# Patient Record
Sex: Female | Born: 1960 | Race: White | Hispanic: No | Marital: Married | Smoking: Never smoker
Health system: Southern US, Community
[De-identification: ages and names within clinical notes are randomized; demographics above are authoritative.]

## PROBLEM LIST (undated history)

## (undated) DIAGNOSIS — F419 Anxiety disorder, unspecified: Secondary | ICD-10-CM

## (undated) DIAGNOSIS — R112 Nausea with vomiting, unspecified: Secondary | ICD-10-CM

## (undated) DIAGNOSIS — Z923 Personal history of irradiation: Secondary | ICD-10-CM

## (undated) DIAGNOSIS — K449 Diaphragmatic hernia without obstruction or gangrene: Secondary | ICD-10-CM

## (undated) DIAGNOSIS — Z9889 Other specified postprocedural states: Secondary | ICD-10-CM

## (undated) DIAGNOSIS — K219 Gastro-esophageal reflux disease without esophagitis: Secondary | ICD-10-CM

## (undated) DIAGNOSIS — Z9221 Personal history of antineoplastic chemotherapy: Secondary | ICD-10-CM

## (undated) DIAGNOSIS — R232 Flushing: Secondary | ICD-10-CM

## (undated) DIAGNOSIS — C50919 Malignant neoplasm of unspecified site of unspecified female breast: Secondary | ICD-10-CM

## (undated) DIAGNOSIS — D649 Anemia, unspecified: Secondary | ICD-10-CM

## (undated) DIAGNOSIS — R12 Heartburn: Secondary | ICD-10-CM

## (undated) DIAGNOSIS — I10 Essential (primary) hypertension: Secondary | ICD-10-CM

## (undated) DIAGNOSIS — E785 Hyperlipidemia, unspecified: Secondary | ICD-10-CM

## (undated) DIAGNOSIS — K5792 Diverticulitis of intestine, part unspecified, without perforation or abscess without bleeding: Secondary | ICD-10-CM

## (undated) DIAGNOSIS — K5790 Diverticulosis of intestine, part unspecified, without perforation or abscess without bleeding: Secondary | ICD-10-CM

## (undated) HISTORY — DX: Anemia, unspecified: D64.9

## (undated) HISTORY — PX: BREAST BIOPSY: SHX20

## (undated) HISTORY — DX: Diaphragmatic hernia without obstruction or gangrene: K44.9

## (undated) HISTORY — DX: Malignant neoplasm of unspecified site of unspecified female breast: C50.919

## (undated) HISTORY — PX: TONSILLECTOMY AND ADENOIDECTOMY: SUR1326

## (undated) HISTORY — DX: Diverticulitis of intestine, part unspecified, without perforation or abscess without bleeding: K57.92

## (undated) HISTORY — DX: Gastro-esophageal reflux disease without esophagitis: K21.9

## (undated) HISTORY — DX: Diverticulosis of intestine, part unspecified, without perforation or abscess without bleeding: K57.90

## (undated) HISTORY — DX: Essential (primary) hypertension: I10

## (undated) HISTORY — DX: Heartburn: R12

## (undated) HISTORY — PX: TUBAL LIGATION: SHX77

## (undated) HISTORY — DX: Hyperlipidemia, unspecified: E78.5

## (undated) HISTORY — DX: Flushing: R23.2

## (undated) HISTORY — DX: Anxiety disorder, unspecified: F41.9

---

## 1998-12-20 ENCOUNTER — Other Ambulatory Visit: Admission: RE | Admit: 1998-12-20 | Discharge: 1998-12-20 | Payer: Self-pay | Admitting: Obstetrics and Gynecology

## 1999-05-15 ENCOUNTER — Inpatient Hospital Stay (HOSPITAL_COMMUNITY): Admission: AD | Admit: 1999-05-15 | Discharge: 1999-05-15 | Payer: Self-pay | Admitting: Obstetrics and Gynecology

## 1999-05-30 ENCOUNTER — Inpatient Hospital Stay (HOSPITAL_COMMUNITY): Admission: AD | Admit: 1999-05-30 | Discharge: 1999-05-30 | Payer: Self-pay | Admitting: Obstetrics and Gynecology

## 1999-06-15 ENCOUNTER — Inpatient Hospital Stay (HOSPITAL_COMMUNITY): Admission: AD | Admit: 1999-06-15 | Discharge: 1999-06-18 | Payer: Self-pay | Admitting: Obstetrics and Gynecology

## 1999-06-19 ENCOUNTER — Encounter (HOSPITAL_COMMUNITY): Admission: RE | Admit: 1999-06-19 | Discharge: 1999-09-17 | Payer: Self-pay | Admitting: *Deleted

## 1999-07-31 ENCOUNTER — Other Ambulatory Visit: Admission: RE | Admit: 1999-07-31 | Discharge: 1999-07-31 | Payer: Self-pay | Admitting: Obstetrics and Gynecology

## 1999-12-11 HISTORY — PX: CHOLECYSTECTOMY: SHX55

## 2000-06-11 ENCOUNTER — Observation Stay (HOSPITAL_COMMUNITY): Admission: RE | Admit: 2000-06-11 | Discharge: 2000-06-12 | Payer: Self-pay | Admitting: Surgery

## 2000-10-01 ENCOUNTER — Other Ambulatory Visit: Admission: RE | Admit: 2000-10-01 | Discharge: 2000-10-01 | Payer: Self-pay | Admitting: Obstetrics and Gynecology

## 2001-10-14 ENCOUNTER — Other Ambulatory Visit: Admission: RE | Admit: 2001-10-14 | Discharge: 2001-10-14 | Payer: Self-pay | Admitting: Obstetrics and Gynecology

## 2003-10-13 ENCOUNTER — Other Ambulatory Visit: Admission: RE | Admit: 2003-10-13 | Discharge: 2003-10-13 | Payer: Self-pay | Admitting: Obstetrics and Gynecology

## 2004-10-30 ENCOUNTER — Other Ambulatory Visit: Admission: RE | Admit: 2004-10-30 | Discharge: 2004-10-30 | Payer: Self-pay | Admitting: Obstetrics and Gynecology

## 2005-11-23 ENCOUNTER — Other Ambulatory Visit: Admission: RE | Admit: 2005-11-23 | Discharge: 2005-11-23 | Payer: Self-pay | Admitting: Obstetrics and Gynecology

## 2011-01-04 ENCOUNTER — Encounter
Admission: RE | Admit: 2011-01-04 | Discharge: 2011-01-04 | Payer: Self-pay | Source: Home / Self Care | Attending: Internal Medicine | Admitting: Internal Medicine

## 2011-01-24 ENCOUNTER — Emergency Department (HOSPITAL_COMMUNITY)
Admission: EM | Admit: 2011-01-24 | Discharge: 2011-01-25 | Disposition: A | Discharge status: Home / Self Care | Payer: BC Managed Care – PPO | Attending: Emergency Medicine | Admitting: Emergency Medicine

## 2011-01-24 DIAGNOSIS — K5732 Diverticulitis of large intestine without perforation or abscess without bleeding: Secondary | ICD-10-CM | POA: Insufficient documentation

## 2011-01-24 DIAGNOSIS — K5900 Constipation, unspecified: Secondary | ICD-10-CM | POA: Insufficient documentation

## 2011-01-24 DIAGNOSIS — R1915 Other abnormal bowel sounds: Secondary | ICD-10-CM | POA: Insufficient documentation

## 2011-01-24 DIAGNOSIS — R109 Unspecified abdominal pain: Secondary | ICD-10-CM | POA: Insufficient documentation

## 2011-01-24 DIAGNOSIS — Z79899 Other long term (current) drug therapy: Secondary | ICD-10-CM | POA: Insufficient documentation

## 2011-01-24 LAB — CBC
Hemoglobin: 12.5 g/dL (ref 12.0–15.0)
MCH: 25.9 pg — ABNORMAL LOW (ref 26.0–34.0)
Platelets: 249 10*3/uL (ref 150–400)
RBC: 4.82 MIL/uL (ref 3.87–5.11)
WBC: 12.4 10*3/uL — ABNORMAL HIGH (ref 4.0–10.5)

## 2011-01-24 LAB — BASIC METABOLIC PANEL
CO2: 27 mEq/L (ref 19–32)
Chloride: 106 mEq/L (ref 96–112)
Creatinine, Ser: 0.56 mg/dL (ref 0.4–1.2)
GFR calc Af Amer: >60 mL/min (ref >60)
Sodium: 142 mEq/L (ref 135–145)

## 2011-01-24 LAB — DIFFERENTIAL
Basophils Absolute: 0.1 10*3/uL (ref 0.0–0.1)
Basophils Relative: 1 % (ref 0–1)
Eosinophils Absolute: 0.2 10*3/uL (ref 0.0–0.7)
Monocytes Relative: 7 % (ref 3–12)
Neutro Abs: 7 10*3/uL (ref 1.7–7.7)
Neutrophils Relative %: 56 % (ref 43–77)

## 2011-01-25 ENCOUNTER — Emergency Department (HOSPITAL_COMMUNITY): Payer: BC Managed Care – PPO

## 2011-01-25 LAB — URINALYSIS, ROUTINE W REFLEX MICROSCOPIC
Ketones, ur: NEGATIVE mg/dL
Nitrite: NEGATIVE
Protein, ur: NEGATIVE mg/dL
pH: 6 (ref 5.0–8.0)

## 2011-01-25 LAB — POCT PREGNANCY, URINE: Preg Test, Ur: NEGATIVE

## 2011-01-25 MED ORDER — IOHEXOL 300 MG/ML  SOLN
100.0000 mL | Freq: Once | INTRAMUSCULAR | Status: AC | PRN
  Administered 2011-01-25: 100 mL via INTRAVENOUS

## 2011-02-09 ENCOUNTER — Ambulatory Visit: Payer: Self-pay | Admitting: Gastroenterology

## 2012-09-25 ENCOUNTER — Encounter: Payer: Self-pay | Admitting: Physician Assistant

## 2012-11-20 LAB — LIPID PANEL
Cholesterol: 245 mg/dL — AB (ref 0–200)
HDL: 52 mg/dL (ref 35–70)
Triglycerides: 117 mg/dL (ref 40–160)

## 2012-11-26 ENCOUNTER — Telehealth: Payer: Self-pay | Admitting: *Deleted

## 2012-11-26 NOTE — Telephone Encounter (Signed)
Called pt to ask her if we are to monitored her cholesterol because is slightly high from a test that was performed at her ob gyn, she states that she called to schedule an appt. With Dr. Merla Riches and he is full that she does want for urgent medical to follow her cholesterol.

## 2012-11-26 NOTE — Telephone Encounter (Signed)
Per Dr. Patsy Lager pt needs to come in for blood work. Pt aware she will come in when she can.

## 2012-11-28 ENCOUNTER — Other Ambulatory Visit: Payer: Self-pay | Admitting: Obstetrics and Gynecology

## 2012-11-28 DIAGNOSIS — R928 Other abnormal and inconclusive findings on diagnostic imaging of breast: Secondary | ICD-10-CM

## 2012-12-08 ENCOUNTER — Ambulatory Visit
Admission: RE | Admit: 2012-12-08 | Discharge: 2012-12-08 | Disposition: A | Discharge status: Home / Self Care | Payer: BC Managed Care – PPO | Source: Ambulatory Visit | Attending: Obstetrics and Gynecology | Admitting: Obstetrics and Gynecology

## 2012-12-08 ENCOUNTER — Other Ambulatory Visit: Payer: Self-pay | Admitting: Obstetrics and Gynecology

## 2012-12-08 DIAGNOSIS — R928 Other abnormal and inconclusive findings on diagnostic imaging of breast: Secondary | ICD-10-CM

## 2012-12-08 DIAGNOSIS — C50919 Malignant neoplasm of unspecified site of unspecified female breast: Secondary | ICD-10-CM

## 2012-12-08 HISTORY — DX: Malignant neoplasm of unspecified site of unspecified female breast: C50.919

## 2012-12-11 ENCOUNTER — Telehealth: Payer: Self-pay | Admitting: *Deleted

## 2012-12-11 ENCOUNTER — Other Ambulatory Visit: Payer: BC Managed Care – PPO

## 2012-12-11 NOTE — Telephone Encounter (Signed)
Pt called and I confirmed 12/17/12 appt w/ pt.

## 2012-12-12 ENCOUNTER — Other Ambulatory Visit: Payer: Self-pay | Admitting: *Deleted

## 2012-12-12 DIAGNOSIS — C50419 Malignant neoplasm of upper-outer quadrant of unspecified female breast: Secondary | ICD-10-CM

## 2012-12-12 DIAGNOSIS — Z171 Estrogen receptor negative status [ER-]: Secondary | ICD-10-CM | POA: Insufficient documentation

## 2012-12-12 DIAGNOSIS — C50411 Malignant neoplasm of upper-outer quadrant of right female breast: Secondary | ICD-10-CM

## 2012-12-12 HISTORY — DX: Estrogen receptor negative status (ER-): Z17.1

## 2012-12-12 HISTORY — DX: Estrogen receptor negative status (ER-): C50.411

## 2012-12-17 ENCOUNTER — Encounter: Payer: Self-pay | Admitting: *Deleted

## 2012-12-17 ENCOUNTER — Ambulatory Visit
Admission: RE | Admit: 2012-12-17 | Discharge: 2012-12-17 | Disposition: A | Discharge status: Home / Self Care | Payer: BC Managed Care – PPO | Source: Ambulatory Visit | Attending: Radiation Oncology | Admitting: Radiation Oncology

## 2012-12-17 ENCOUNTER — Ambulatory Visit: Payer: BC Managed Care – PPO | Attending: General Surgery | Admitting: Physical Therapy

## 2012-12-17 ENCOUNTER — Encounter: Payer: Self-pay | Admitting: Oncology

## 2012-12-17 ENCOUNTER — Ambulatory Visit (HOSPITAL_BASED_OUTPATIENT_CLINIC_OR_DEPARTMENT_OTHER): Payer: BC Managed Care – PPO | Admitting: Oncology

## 2012-12-17 ENCOUNTER — Ambulatory Visit (HOSPITAL_BASED_OUTPATIENT_CLINIC_OR_DEPARTMENT_OTHER): Payer: BC Managed Care – PPO | Admitting: General Surgery

## 2012-12-17 ENCOUNTER — Ambulatory Visit: Payer: BC Managed Care – PPO

## 2012-12-17 ENCOUNTER — Other Ambulatory Visit (HOSPITAL_BASED_OUTPATIENT_CLINIC_OR_DEPARTMENT_OTHER): Payer: BC Managed Care – PPO | Admitting: Lab

## 2012-12-17 VITALS — BP 150/102 | HR 85 | Temp 98.0°F | Resp 20 | Ht 66.5 in | Wt 234.3 lb

## 2012-12-17 DIAGNOSIS — C50419 Malignant neoplasm of upper-outer quadrant of unspecified female breast: Secondary | ICD-10-CM

## 2012-12-17 DIAGNOSIS — M25619 Stiffness of unspecified shoulder, not elsewhere classified: Secondary | ICD-10-CM | POA: Insufficient documentation

## 2012-12-17 DIAGNOSIS — IMO0001 Reserved for inherently not codable concepts without codable children: Secondary | ICD-10-CM | POA: Insufficient documentation

## 2012-12-17 DIAGNOSIS — C50919 Malignant neoplasm of unspecified site of unspecified female breast: Secondary | ICD-10-CM | POA: Insufficient documentation

## 2012-12-17 DIAGNOSIS — R293 Abnormal posture: Secondary | ICD-10-CM | POA: Insufficient documentation

## 2012-12-17 DIAGNOSIS — Z171 Estrogen receptor negative status [ER-]: Secondary | ICD-10-CM

## 2012-12-17 LAB — COMPREHENSIVE METABOLIC PANEL (CC13)
ALT: 22 U/L (ref 0–55)
AST: 14 U/L (ref 5–34)
Albumin: 4.2 g/dL (ref 3.5–5.0)
Alkaline Phosphatase: 112 U/L (ref 40–150)
Chloride: 102 mEq/L (ref 98–107)
Potassium: 4 mEq/L (ref 3.5–5.1)
Sodium: 141 mEq/L (ref 136–145)
Total Protein: 8.1 g/dL (ref 6.4–8.3)

## 2012-12-17 LAB — CBC WITH DIFFERENTIAL/PLATELET
EOS%: 2.2 % (ref 0.0–7.0)
MCH: 26 pg (ref 25.1–34.0)
MCV: 79.2 fL — ABNORMAL LOW (ref 79.5–101.0)
MONO%: 6.4 % (ref 0.0–14.0)
NEUT#: 4.2 10*3/uL (ref 1.5–6.5)
RBC: 4.92 10*6/uL (ref 3.70–5.45)
RDW: 13.5 % (ref 11.2–14.5)
lymph#: 3.2 10*3/uL (ref 0.9–3.3)

## 2012-12-17 LAB — CANCER ANTIGEN 27.29: CA 27.29: 38 U/mL (ref 0–39)

## 2012-12-17 NOTE — Progress Notes (Signed)
Checked in new pt with no financial concerns. °

## 2012-12-17 NOTE — Patient Instructions (Addendum)
Proceed with breast surgery and port placement  We discussed chemotherapy consisting of Taxoter/carboplatin/herceptin  Echocardiogram Chemotherapy teaching class MRI of breasts Dr. Shana Chute

## 2012-12-17 NOTE — Progress Notes (Signed)
Chief complaint:  Right breast cancer.    HISTORY: Patient is a 51-year-old female who presents with a palpable right breast mass. This is seen on mammogram and ultrasound. On Wednesday it appeared a 1.6 cm and another was 2.5. She has been able to feel it. She has no family history of breast cancer but did have a father with stomach cancer at age 23, a maternal grandmother with skin cancer,  maternal cousin with Hodgkin's, and a paternal grandfather with lung cancer.  She has no personal history of cancer. 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 She has not ever been a smoker and does drink around 1-2 beers per week. She does not use any illicit drugs. Menarche was at 15. She is no longer having periods and ceased having periods in 2010. She has not ever had any hormone replacement therapy. She carried 3 children to term and did use hormone shots for contraception but does not recall the period of time. Other than colonoscopy her health screening is up to date.  Past Medical History  Diagnosis Date  . Breast cancer   . Hyperlipidemia   . Diverticulitis   . Hernia, hiatal   . Heartburn   . Anxiety   . Hot flashes     Past Surgical History  Procedure Date  . Cesarean section     x 3  . Tubal ligation   . Tonsillectomy and adenoidectomy     Current Outpatient Prescriptions  Medication Sig Dispense Refill  . ibuprofen (ADVIL,MOTRIN) 200 MG tablet Take 200 mg by mouth as needed.      . LORazepam (ATIVAN) 0.5 MG tablet Take 0.5 mg by mouth as needed.      . omeprazole (PRILOSEC) 20 MG capsule Take 20 mg by mouth daily.      . Probiotic Product (PROBIOTIC DAILY PO) Take 1 each by mouth.         Allergies  Allergen Reactions  . Codeine   . Medralone (Methylprednisolone Acetate)      Family History  Problem Relation Age of Onset  . Stomach cancer Father   . Lung cancer Maternal Uncle   . Skin cancer Maternal Grandmother   . Lung cancer Maternal Grandfather   . Lymphoma Cousin       History   Social History  . Marital Status: Single    Spouse Name: N/A    Number of Children: N/A  . Years of Education: N/A   Social History Main Topics  . Smoking status: Never Smoker   . Smokeless tobacco: Not on file  . Alcohol Use: 1.2 oz/week    2 Cans of beer per week  . Drug Use: No  . Sexually Active: Yes     REVIEW OF SYSTEMS - PERTINENT POSITIVES ONLY: 12 point review of systems negative other than HPI and PMH except for fatigue, night sweats, difficulty sleeping, palpitations, heartburn, anxiety  EXAM: Wt Readings from Last 3 Encounters:  12/17/12 234 lb 4.8 oz (106.278 kg)   Temp Readings from Last 3 Encounters:  12/17/12 98 F (36.7 C)    BP Readings from Last 3 Encounters:  12/17/12 150/102   Pulse Readings from Last 3 Encounters:  12/17/12 85    Gen:  No acute distress.  Well nourished and well groomed.   Neurological: Alert and oriented to person, place, and time. Coordination normal.  Head: Normocephalic and atraumatic.  Eyes: Conjunctivae are normal. Pupils are equal, round, and reactive to light. No scleral   icterus.  Neck: Normal range of motion. Neck supple. No tracheal deviation or thyromegaly present.  Cardiovascular: Normal rate, regular rhythm, normal heart sounds and intact distal pulses.  Exam reveals no gallop and no friction rub.  No murmur heard. Respiratory: Effort normal.  No respiratory distress. No chest wall tenderness. Breath sounds normal.  No wheezes, rales or rhonchi.  Breast:  Breasts are reasonably symmetric.  Right breast has 2 cm mass palpable at 11:30-12 oclock.  No LAD present.  No nipple retraction or skin dimpling is seen.   GI: Soft. Bowel sounds are normal. The abdomen is soft and nontender.  There is no rebound and no guarding.  Musculoskeletal: Normal range of motion. Extremities are nontender.  Lymphadenopathy: No cervical, preauricular, postauricular or axillary adenopathy is present Skin: Skin is warm and  dry. No rash noted. No diaphoresis. No erythema. No pallor. No clubbing, cyanosis, or edema.   Psychiatric: Normal mood and affect. Behavior is normal. Judgment and thought content normal.    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 - SEE COMMENT.  RADIOLOGY RESULTS: See E-Chart or I-Site for most recent results.  Images and reports are reviewed. Mammo/ultrasound IMPRESSION:  1.6 cm irregular mass located within the right breast at the 11:30  o'clock position 7 cm from the nipple corresponding to the  mammographic finding. Tissue sampling is recommended. I have  discussed ultrasound-guided core biopsy. The patient would like to  proceed with this. This will be performed and reported separately.  RECOMMENDATION:  Right breast ultrasound guided core biopsy.  I have discussed the findings and recommendations with the patient.  Results were also provided in writing at the conclusion of the  visit.  BI-RADS CATEGORY 5: Highly suggestive of malignancy - appropriate  action should be taken.     ASSESSMENT AND PLAN: Cancer of upper-outer quadrant of female breast Patient has clinical T1 N0 right breast cancer with a palpable mass at 12:00.  Based on the size discrepancy on mammogram versus ultrasound, she is requesting an MRI. She has hormone receptor negative disease with positive Herceptin and relatively high Ki-67.  I will tentatively posterior for a lumpectomy without needle localization, sentinel lymph node biopsy, and Port-A-Cath placement.   The surgery and incisions were discussed with the patient as well as the risks of surgery. I discussed bleeding, infection, damage to adjacent structures, pneumothorax, need for additional surgeries, and possible cancer recurrence.  The patient understands and wishes to proceed. Should her MRI shows something that would necessitate a change in her  surgical plan we will discuss this. If she does have a much larger tumor on MRI, we will proceed with neoadjuvant chemotherapy and only do port placement.  45 minutes were spent in counseling, evaluation, examination, and coordination of care.       Nikkole Placzek L Lebert Lovern MD Surgical Oncology, General and Endocrine Surgery [facility]      Visit Diagnoses: 1. Cancer of upper-outer quadrant of female breast     Primary Care Physician: DOOLITTLE, ROBERT P, MD  Khan, Kalsoom, MD [place], St[facility]  

## 2012-12-17 NOTE — Progress Notes (Signed)
Julia Zuniga [id number] [birth year] 51 y.o. 12/17/2012 10:19 AM  CC  DOOLITTLE, Harrel Lemon, MD [address] [place] [address] Dr. Almond Lint Dr. Verne Spurr Dr. Richarda Overlie  REASON FOR CONSULTATION:  52 year old female with new diagnosis of invasive ductal carcinoma Of the right breast diagnosed in December 2013. Patient is seen in the multidisciplinary breast clinic for discussion of treatment options.  STAGE:   Cancer of upper-outer quadrant of female breast   Primary site: Breast (Right)   Staging method: AJCC 7th Edition   Clinical: Stage IA (T1c, N0, cM0)   Summary: Stage IA (T1c, N0, cM0)  REFERRING PHYSICIAN: Dr. Everardo Beals  HISTORY OF PRESENT ILLNESS:  Julia Zuniga is a 52 y.o. female.  With medical history significant for hyperlipidemia diverticulitis and hiatal hernia. She presented for a screening mammogram in December 2013 that showed an area of abnormality on the right. This measured about 1.6 cm. 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 HER-2/neu was amplified at 3.88. Patient did not have a MRI scan performed. Post biopsy she is doing well. She is without any complaints. Patient case was presented at the multidisciplinary breast conference. She is seen today by Dr. Lurline Hare and Dr. Everardo Beals. Patient does desire breast conservation.   Past Medical History: Past Medical History  Diagnosis Date  . Breast cancer   . Hyperlipidemia   . Diverticulitis   . Hernia, hiatal   . Heartburn   . Anxiety   . Hot flashes     Past Surgical History: Past Surgical History  Procedure Date  . Cesarean section     x 3  . Tubal ligation   . Tonsillectomy and adenoidectomy     Family History: Family History  Problem Relation Age of Onset  . Stomach cancer Father   . Lung cancer Maternal Uncle   . Skin cancer Maternal Grandmother   .  Lung cancer Maternal Grandfather   . Lymphoma Cousin     Social History History  Substance Use Topics  . Smoking status: Never Smoker   . Smokeless tobacco: Not on file  . Alcohol Use: 1.2 oz/week    2 Cans of beer per week    Allergies: Allergies  Allergen Reactions  . Codeine   . Medralone (Methylprednisolone Acetate)     Current Medications: No current outpatient prescriptions on file.    OB/GYN History:Patient had menarche at age 30 she underwent menopause in September 2010 she has not been on hormone replacement therapy she said 3 term births first live birth was at 39.  Fertility Discussion:Not applicable Prior History of Cancer:No prior history of malignancy  Health Maintenance:  ColonoscopyNo Bone DensityYes in December 2013 Last PAP smearDecember 2013  ECOG PERFORMANCE STATUS: 0 - Asymptomatic  Genetic Counseling/testing:Patient's history was reviewed and she at this time she is now referred for genetic counseling  REVIEW OF SYSTEMS:  A comprehensive review of systems was negative.  PHYSICAL EXAMINATION: Blood pressure 150/102, pulse 85, temperature 98 F (36.7 C), resp. rate 20, height 5' 6.5" (1.689 m), weight 234 lb 4.8 oz (106.278 kg).  XBJ:YNWGN, healthy, no distress, well nourished and well developed SKIN: skin color, texture, turgor are normal HEAD: Normocephalic EYES: PERRLA, EOMI, Conjunctiva are pink and non-injected EARS: External ears normal OROPHARYNX:no exudate and no erythema  NECK: supple, no adenopathy LYMPH:  no palpable lymphadenopathy, no hepatosplenomegaly BREAST:Right  breast reveals area of hematoma and ecchymosis at the biopsy site measuring 2.5 cm. Left breast no masses or nipple discharge the LUNGS: clear to auscultation and percussion HEART: regular rate & rhythm ABDOMEN:abdomen soft, non-tender and no masses or organomegaly BACK: No CVA tenderness, Range of motion is normal EXTREMITIES:no edema, no clubbing, no cyanosis    NEURO: alert & oriented x 3 with fluent speech, no focal motor/sensory deficits, gait normal     STUDIES/RESULTS: US Breast Right  12/08/2012  *RADIOLOGY REPORT*  Clinical Data:  Recall from screening mammogram.  DIGITAL DIAGNOSTIC RIGHT BREAST MAMMOGRAM  AND RIGHT BREAST ULTRASOUND:  Comparison:  Prior studies  Findings:  ACR Breast Density Category 1: The breast tissue is almost entirely fatty.  Spot compression views of the right breast demonstrate an irregular oval mass to be located superiorly within the right breast at approximately the 12 o'clock position.  By mammography this measures 2.5 cm in size.  On physical exam, there is a palpable mass located within the right breast at the 11 of 30 o'clock position located 7 cm from the nipple which by physical examination measures approximately 2.5 cm in size.  There is no palpable right axillary adenopathy.  Ultrasound is performed, showing an irregularly marginated hypoechoic mass with an echogenic rim and shadowing located within the right breast at the 11:30 o'clock position 7 cm from nipple corresponding to the palpable finding and mammographic finding. This measures 1.6 x 1.5 x 1.4 cm in size.  Ultrasound right axilla demonstrates no adenopathy.  IMPRESSION: 1.6 cm irregular mass located within the right breast at the 11:30 o'clock position 7 cm from the nipple corresponding to the mammographic finding.  Tissue sampling is recommended.  I have discussed ultrasound-guided core biopsy.  The patient would like to proceed with this.  This will be performed and reported separately.  RECOMMENDATION: Right breast ultrasound guided core biopsy.  I have discussed the findings and recommendations with the patient. Results were also provided in writing at the conclusion of the visit.  BI-RADS CATEGORY 5:  Highly suggestive of malignancy - appropriate action should be taken.   Original Report Authenticated By: Rolla Plate, M.D.    [place] Core  Biopsy  12/09/2012  **ADDENDUM** CREATED: 12/09/2012 11:01:38  The pathology demonstrated grade 2 invasive ductal carcinoma.  This is concordant with imaging findings.  I have discussed findings with the patient by telephone and answered her questions.  The patient states that her biopsy site is clean and dry without hematoma formation.  Post biopsy wound care instructions were reviewed with the patient.  The patient will be scheduled to be seen at the breast cancer multidisciplinary clinic on 12/17/2012. Due to our preoperative breast MRI protocol, breast MRI has not been scheduled but can be scheduled if felt needed by the breast surgeon.  The patient was encouraged to call the Breast Center for additional questions or concerns.  **END ADDENDUM** SIGNED BY: Rolla Plate, M.D.   12/08/2012  *RADIOLOGY REPORT*  Clinical Data:  Right breast mass.  ULTRASOUND GUIDED CORE BIOPSY OF THE right BREAST  Comparison: Previous exams.  I met with the patient and we discussed the procedure of ultrasound- guided biopsy, including benefits and alternatives.  We discussed the high likelihood of a successful procedure. We discussed the risks of the procedure, including infection, bleeding, tissue injury, clip migration, and inadequate sampling.  Informed written consent was given.  Using sterile technique 2% lidocaine, ultrasound guidance and a 14 gauge automated biopsy device,  biopsy was performed of the mass located within the right breast at the 11:30 o'clock position using a medial approach.  At the conclusion of the procedure a ribbon shaped tissue marker clip was deployed into the biopsy cavity. Follow up 2 view mammogram was performed and dictated separately.  IMPRESSION: Ultrasound guided biopsy of the right breast mass located at the 11:30 o'clock position.  No apparent complications.  Original Report Authenticated By: Rolla Plate, M.D.    Mm Digital Diagnostic Unilat R  12/08/2012  *RADIOLOGY REPORT*   Clinical Data:  Post right breast ultrasound guided core biopsy.  DIGITAL DIAGNOSTIC RIGHT BREAST MAMMOGRAM  Comparison:  Previous exams.  Findings:  Films are performed following ultrasound guided biopsy of the mass located within the right breast at the 11:30 o'clock position.  Ribbon shaped clip is in appropriate position.  IMPRESSION: Appropriate positioning of clip following right breast ultrasound guided core biopsy.   Original Report Authenticated By: Rolla Plate, M.D.    Mm Digital Diagnostic Unilat R  12/08/2012  *RADIOLOGY REPORT*  Clinical Data:  Recall from screening mammogram.  DIGITAL DIAGNOSTIC RIGHT BREAST MAMMOGRAM  AND RIGHT BREAST ULTRASOUND:  Comparison:  Prior studies  Findings:  ACR Breast Density Category 1: The breast tissue is almost entirely fatty.  Spot compression views of the right breast demonstrate an irregular oval mass to be located superiorly within the right breast at approximately the 12 o'clock position.  By mammography this measures 2.5 cm in size.  On physical exam, there is a palpable mass located within the right breast at the 11 of 30 o'clock position located 7 cm from the nipple which by physical examination measures approximately 2.5 cm in size.  There is no palpable right axillary adenopathy.  Ultrasound is performed, showing an irregularly marginated hypoechoic mass with an echogenic rim and shadowing located within the right breast at the 11:30 o'clock position 7 cm from nipple corresponding to the palpable finding and mammographic finding. This measures 1.6 x 1.5 x 1.4 cm in size.  Ultrasound right axilla demonstrates no adenopathy.  IMPRESSION: 1.6 cm irregular mass located within the right breast at the 11:30 o'clock position 7 cm from the nipple corresponding to the mammographic finding.  Tissue sampling is recommended.  I have discussed ultrasound-guided core biopsy.  The patient would like to proceed with this.  This will be performed and reported  separately.  RECOMMENDATION: Right breast ultrasound guided core biopsy.  I have discussed the findings and recommendations with the patient. Results were also provided in writing at the conclusion of the visit.  BI-RADS CATEGORY 5:  Highly suggestive of malignancy - appropriate action should be taken.   Original Report Authenticated By: Rolla Plate, M.D.      LABS:    Chemistry      Component Value Date/Time   NA 141 12/17/2012 0817   NA 142 01/24/2011 2235   K 4.0 12/17/2012 0817   K 3.9 01/24/2011 2235   CL 102 12/17/2012 0817   CL 106 01/24/2011 2235   CO2 27 12/17/2012 0817   CO2 27 01/24/2011 2235   BUN 9.0 12/17/2012 0817   BUN 7 01/24/2011 2235   CREATININE 0.7 12/17/2012 0817   CREATININE 0.56 01/24/2011 2235      Component Value Date/Time   CALCIUM 10.2 12/17/2012 0817   CALCIUM 10.0 01/24/2011 2235   ALKPHOS 112 12/17/2012 0817   AST 14 12/17/2012 0817   ALT 22 12/17/2012 0817   BILITOT 0.49 12/17/2012 0817  Lab Results  Component Value Date   WBC 8.2 12/17/2012   HGB 12.8 12/17/2012   HCT 38.9 12/17/2012   MCV 79.2* 12/17/2012   PLT 250 12/17/2012   PATHOLOGY: ADDITIONAL INFORMATION: PROGNOSTIC INDICATORS - ACIS Results IMMUNOHISTOCHEMICAL AND MORPHOMETRIC ANALYSIS BY THE AUTOMATED CELLULAR IMAGING SYSTEM (ACIS) Estrogen Receptor (Negative, <1%): 0%, NEGATIVE Progesterone Receptor (Negative, <1%): 0% ,NEGATIVE Proliferation Marker Ki67 by M IB-1 (Low<20%): 83% COMMENT: The negative hormone receptor study(ies) in this case have an internal positive control. All controls stained appropriately Pecola Leisure MD Pathologist, Electronic Signature ( Signed 12/17/2012) CHROMOGENIC IN-SITU HYBRIDIZATION Interpretation: HER2/NEU BY CISH - SHOWS AMPLIFICATION BY CISH ANALYSIS. THE RATIO OF HER2: CEP 17 SIGNALS WAS 3.88. Reference range: Ratio: HER2:CEP17 < 1.8 gene amplification not observed Ratio: HER2:CEP 17 1.8-2.2 - equivocal result 1 of 3 FINAL for Hreha, Corrina  ([id number]) ADDITIONAL INFORMATION:(continued) Ratio: HER2:CEP17 > 2.2 - gene amplification observed Pecola Leisure MD Pathologist, Electronic Signature ( Signed 12/16/2012) FINAL DIAGNOSIS Diagnosis Breast, right, needle core biopsy, 11:30 oc - INVASIVE DUCTAL CARCINOMA. - SEE COMMENT. Microscopic Comment The carcinoma appears grade II. A breast prognostic profile will be performed and the results reported separately. The results were called to the [facility] on 12/09/2012. (JBK:kh 12/09/12) Pecola Leisure MD Pathologist, Electronic Signature (Case signed 12/09/2012)  ASSESSMENT    53 year old female with  #1 new diagnosis of stage I (1.6 cm) invasive ductal carcinoma of the right breast after she presented with a screening mammogram in December 2013. Patient is status post needle core biopsy that revealed an invasive ductal carcinoma grade 2 ER negative PR negative HER-2/neu positive with amplification of 3.88 Ki-67 elevated at 80%. Patient is interested in breast conservation. She was seen by Dr. Everardo Beals who will plan on doing a lumpectomy with sentinel lymph node biopsy.  #2 because patient's tumor is HER-2 positive she will require her to based chemotherapy consisting of Taxotere carboplatinum and Herceptin. We discussed the logistics of this. Post lumpectomy she will also need radiation therapy.  #3 of note patient's mass on ultrasound measured 1.6 cm but by mammogram it was 2.5 cm she has not had a MRI performed and I have recommended that she proceed with MRI since there is discordant results by mammogram and ultrasound. I do think it is important for [place] to identify any other areas of concern before she undergoes breast conservation. All of this is explained to the patient.  #4 patient will also need an echocardiogram chemotherapy class and I have also referred her to Dr. Barth Kirks. For chemotherapy administration she will also need a Port-A-Cath and this  certainly can be done at the time of her definitive surgery.  Clinical Trial Eligibility: no  Multidisciplinary conference discussion yes    PLAN:    #1 proceed with surgery consisting of lumpectomy with sentinel lymph node biopsy and a Port-A-Cath placement.  #2 patient will receive her 2-based therapy and therefore she will need an echocardiogram be seen by Dr. Gala Romney and her chemotherapy teaching class.  #3 MRI of the bilateral breasts to evaluate any other occult malignancies.  #4 patient will be seen back after her definitive search       Discussion: Patient is being treated per NCCN breast cancer care guidelines appropriate for stage.I   Thank you so much for allowing me to participate in the care of Julia Zuniga. I will continue to follow up the patient with you and assist in her care.  All questions were answered.  The patient knows to call the clinic with any problems, questions or concerns. We can certainly see the patient much sooner if necessary.  I spent 60 minutes counseling the patient face to face. The total time spent in the appointment was 60 minutes.  Drue Second, MD Medical/Oncology [facility] [phone] (beeper) [phone] (Office)  12/17/2012, 10:20 AM

## 2012-12-17 NOTE — Progress Notes (Signed)
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 female  percent of her screening mammogram. She was found to have an area of abnormality on the right side. This measured 1.6 cm. A biopsy was performed which showed grade 2 invasive ductal carcinoma. This was ER negative PR negative and HER-2 positive. Ki-67 at 80%. She did not have an MRI scan. She reports no symptoms prior to her biopsy. She reports no headaches bone pain or shortness of breath. He is accompanied by her mother to clinic today. To his spoke with Dr. Welton Flakes and has elected for breast conservation.Marland Kitchen  PREVIOUS RADIATION THERAPY: No  PAST MEDICAL HISTORY:  has a past medical history of Breast cancer; Hyperlipidemia; Diverticulitis; Hernia, hiatal; Heartburn; Anxiety; and Hot flashes.    PAST SURGICAL HISTORY: Past Surgical History  Procedure Date  . Cesarean section     x 3  . Tubal ligation   . Tonsillectomy and adenoidectomy     FAMILY HISTORY: family history includes Lung cancer in her maternal grandfather and maternal uncle; Lymphoma in her cousin; Skin cancer in her maternal grandmother; and Stomach cancer in her father.  SOCIAL HISTORY:  reports that she has never smoked. She does not have any smokeless tobacco history on file. She reports that she drinks about 1.2 ounces of alcohol per week. She reports that she does not use illicit drugs.  ALLERGIES: Codeine and Medralone  MEDICATIONS:  Current Outpatient Prescriptions  Medication Sig Dispense Refill  . ibuprofen (ADVIL,MOTRIN) 200 MG tablet Take 200 mg by mouth as needed.      Marland Kitchen LORazepam (ATIVAN) 0.5 MG tablet Take 0.5 mg by mouth as needed.      Marland Kitchen omeprazole (PRILOSEC)  20 MG capsule Take 20 mg by mouth daily.      . Probiotic Product (PROBIOTIC DAILY PO) Take 1 each by mouth.        REVIEW OF SYSTEMS:  A 15 point review of systems is documented in the electronic medical record. This was obtained by the nursing staff. However, I reviewed this with the patient to discuss relevant findings and make appropriate changes.  Pertinent items are noted in HPI.   PHYSICAL EXAM:  vitals were not taken for this visit.  . She is a pleasant female in no distress sitting comfortably examining table. She has no palpable cervical or subclavicular adenopathy. She is alert minus x3. She is 5 out of 5 strength in her bilateral upper extremities. She has a 3 cm palpable abnormality in the upper outer quadrant of the right breast. She has no palpable abnormalities of the left breast.  LABORATORY DATA:  Lab Results  Component Value Date   WBC 8.2 12/17/2012   HGB 12.8 12/17/2012   HCT 38.9 12/17/2012   MCV 79.2* 12/17/2012   PLT 250 12/17/2012   Lab Results  Component Value Date   NA 141 12/17/2012   K 4.0 12/17/2012   CL 102 12/17/2012   CO2 27 12/17/2012   Lab Results  Component Value Date   ALT 22 12/17/2012   AST 14 12/17/2012   ALKPHOS 112 12/17/2012   BILITOT 0.49 12/17/2012     RADIOGRAPHY: US Breast Right  12/08/2012  *RADIOLOGY REPORT*  Clinical Data:  Recall from screening mammogram.  DIGITAL DIAGNOSTIC RIGHT BREAST MAMMOGRAM  AND RIGHT BREAST ULTRASOUND:  Comparison:  Prior studies  Findings:  ACR Breast Density Category 1: The breast tissue is almost entirely fatty.  Spot compression views of the right breast demonstrate an irregular oval mass to be located superiorly within the right breast at approximately the 12 o'clock position.  By mammography this measures 2.5 cm in size.  On physical exam, there is a palpable mass located within the right breast at the 11 of 30 o'clock position located 7 cm from the nipple which by physical examination measures approximately 2.5 cm in size.   There is no palpable right axillary adenopathy.  Ultrasound is performed, showing an irregularly marginated hypoechoic mass with an echogenic rim and shadowing located within the right breast at the 11:30 o'clock position 7 cm from nipple corresponding to the palpable finding and mammographic finding. This measures 1.6 x 1.5 x 1.4 cm in size.  Ultrasound right axilla demonstrates no adenopathy.  IMPRESSION: 1.6 cm irregular mass located within the right breast at the 11:30 o'clock position 7 cm from the nipple corresponding to the mammographic finding.  Tissue sampling is recommended.  I have discussed ultrasound-guided core biopsy.  The patient would like to proceed with this.  This will be performed and reported separately.  RECOMMENDATION: Right breast ultrasound guided core biopsy.  I have discussed the findings and recommendations with the patient. Results were also provided in writing at the conclusion of the visit.  BI-RADS CATEGORY 5:  Highly suggestive of malignancy - appropriate action should be taken.   Original Report Authenticated By: Rolla Plate, M.D.    [place] Core Biopsy  12/09/2012  **ADDENDUM** CREATED: 12/09/2012 11:01:38  The pathology demonstrated grade 2 invasive ductal carcinoma.  This is concordant with imaging findings.  I have discussed findings with the patient by telephone and answered her questions.  The patient states that her biopsy site is clean and dry without hematoma formation.  Post biopsy wound care instructions were reviewed with the patient.  The patient will be scheduled to be seen at the breast cancer multidisciplinary clinic on 12/17/2012. Due to our preoperative breast MRI protocol, breast MRI has not been scheduled but can be scheduled if felt needed by the breast surgeon.  The patient was encouraged to call the [facility] for additional questions or concerns.  **END ADDENDUM** SIGNED BY: Rolla Plate, M.D.   12/08/2012  *RADIOLOGY REPORT*  Clinical Data:   Right breast mass.  ULTRASOUND GUIDED CORE BIOPSY OF THE right BREAST  Comparison: Previous exams.  I met with the patient and we discussed the procedure of ultrasound- guided biopsy, including benefits and alternatives.  We discussed the high likelihood of a successful procedure. We discussed the risks of the procedure, including infection, bleeding, tissue injury, clip migration, and inadequate sampling.  Informed written consent was given.  Using sterile technique 2% lidocaine, ultrasound guidance and a 14 gauge automated biopsy device, biopsy was performed of the mass located within the right breast at the 11:30 o'clock position using a medial approach.  At the conclusion of the procedure a ribbon shaped tissue marker clip was deployed into the biopsy cavity. Follow up 2 view mammogram was performed and dictated separately.  IMPRESSION: Ultrasound guided biopsy of the right breast mass located at the 11:30 o'clock position.  No apparent complications.  Original Report Authenticated By: Rolla Plate, M.D.    Mm Digital Diagnostic Unilat R  12/08/2012  *RADIOLOGY REPORT*  Clinical Data:  Post right breast ultrasound guided core biopsy.  DIGITAL DIAGNOSTIC RIGHT BREAST MAMMOGRAM  Comparison:  Previous exams.  Findings:  Films are performed following ultrasound guided biopsy of the mass located within the right breast at the 11:30 o'clock position.  Ribbon shaped clip is in appropriate position.  IMPRESSION: Appropriate positioning of clip following right breast ultrasound guided core biopsy.   Original Report Authenticated By: Rolla Plate, M.D.    Mm Digital Diagnostic Unilat R  12/08/2012  *RADIOLOGY REPORT*  Clinical Data:  Recall from screening mammogram.  DIGITAL DIAGNOSTIC RIGHT BREAST MAMMOGRAM  AND RIGHT BREAST ULTRASOUND:  Comparison:  Prior studies  Findings:  ACR Breast Density Category 1: The breast tissue is almost entirely fatty.  Spot compression views of the right breast demonstrate  an irregular oval mass to be located superiorly within the right breast at approximately the 12 o'clock position.  By mammography this measures 2.5 cm in size.  On physical exam, there is a palpable mass located within the right breast at the 11 of 30 o'clock position located 7 cm from the nipple which by physical examination measures approximately 2.5 cm in size.  There is no palpable right axillary adenopathy.  Ultrasound is performed, showing an irregularly marginated hypoechoic mass with an echogenic rim and shadowing located within the right breast at the 11:30 o'clock position 7 cm from nipple corresponding to the palpable finding and mammographic finding. This measures 1.6 x 1.5 x 1.4 cm in size.  Ultrasound right axilla demonstrates no adenopathy.  IMPRESSION: 1.6 cm irregular mass located within the right breast at the 11:30 o'clock position 7 cm from the nipple corresponding to the mammographic finding.  Tissue sampling is recommended.  I have discussed ultrasound-guided core biopsy.  The patient would like to proceed with this.  This will be performed and reported separately.  RECOMMENDATION: Right breast ultrasound guided core biopsy.  I have discussed the findings and recommendations with the patient. Results were also provided in writing at the conclusion of the visit.  BI-RADS CATEGORY 5:  Highly suggestive of malignancy - appropriate action should be taken.   Original Report Authenticated By: Rolla Plate, M.D.       IMPRESSION: T1 C. N0 invasive ductal carcinoma the right breast  PLAN: I spoke with the patient and her mother today regarding her diagnosis and options for treatment. We discussed this conservation. We discussed that radiation against 2-4 weeks after chemotherapy. She would like to complete radiation before her daughter's wedding in June. She would therefore like to start her radiation as soon as possible. I've asked her to remind her back oncologists and sent her back to see  me during her last round of chemotherapy. We discussed process of simulation the placement tattoos. We discussed the possible side effects of treatment including but not limited to skin redness, fatigue and skin irritation. We discussed the low likelihood of lung heart damage or secondary malignancies. She will meet with our physical therapist, our surgeon, and a member of our patient family support team. I will meet with her again after chemotherapy is complete. I spent 40 minutes  face to face with the patient and more than 50% of that time was spent in counseling and/or coordination of care.   ------------------------------------------------  Lurline Hare, MD

## 2012-12-17 NOTE — Assessment & Plan Note (Signed)
Patient has clinical T1 N0 right breast cancer with a palpable mass at 12:00.  Based on the size discrepancy on mammogram versus ultrasound, she is requesting an MRI. She has hormone receptor negative disease with positive Herceptin and relatively high Ki-67.  I will tentatively posterior for a lumpectomy without needle localization, sentinel lymph node biopsy, and Port-A-Cath placement.   The surgery and incisions were discussed with the patient as well as the risks of surgery. I discussed bleeding, infection, damage to adjacent structures, pneumothorax, need for additional surgeries, and possible cancer recurrence.  The patient understands and wishes to proceed. Should her MRI shows something that would necessitate a change in her surgical plan we will discuss this. If she does have a much larger tumor on MRI, we will proceed with neoadjuvant chemotherapy and only do port placement.  30 minutes were spent in counseling and evaluation.

## 2012-12-19 ENCOUNTER — Encounter: Payer: Self-pay | Admitting: Oncology

## 2012-12-19 NOTE — Progress Notes (Unsigned)
12/19/2012  I spoke extensively with several people and had a wait time of 45 mins. In an attempt to get this patient's Breast Mri authorized.  She has a new policy that has not be uploaded to their system and the current system does not recognize her old ID number.  The last person I spoke with ws Cordelia Pen, from open enrollment with [facility] [place].  [id number].  She informed me that the Breast Mri could not be authorized because the patiently information was not in the system.  She suggested that the patient either reschedule the exam or asked to be billed for the procedure.  I contacted Osborne Oman [facility] to contact the patient about her appointment.  Bonita Quin [phone]

## 2012-12-21 ENCOUNTER — Other Ambulatory Visit: Payer: BC Managed Care – PPO

## 2012-12-22 ENCOUNTER — Ambulatory Visit
Admission: RE | Admit: 2012-12-22 | Discharge: 2012-12-22 | Disposition: A | Discharge status: Home / Self Care | Payer: BC Managed Care – PPO | Source: Ambulatory Visit | Attending: Oncology | Admitting: Oncology

## 2012-12-22 ENCOUNTER — Telehealth: Payer: Self-pay | Admitting: Oncology

## 2012-12-22 DIAGNOSIS — C50419 Malignant neoplasm of upper-outer quadrant of unspecified female breast: Secondary | ICD-10-CM

## 2012-12-22 MED ORDER — GADOBENATE DIMEGLUMINE 529 MG/ML IV SOLN
20.0000 mL | Freq: Once | INTRAVENOUS | Status: AC | PRN
  Administered 2012-12-22: 20 mL via INTRAVENOUS

## 2012-12-23 ENCOUNTER — Telehealth: Payer: Self-pay | Admitting: *Deleted

## 2012-12-23 DIAGNOSIS — C50419 Malignant neoplasm of upper-outer quadrant of unspecified female breast: Secondary | ICD-10-CM

## 2012-12-23 NOTE — Telephone Encounter (Signed)
Let patient know that right now she is scheduled for the surgery first. Once we have the final pathology results then we will discuss the treatment in detail

## 2012-12-23 NOTE — Telephone Encounter (Signed)
Call from pt with concerns. Pt states " Am I going to get Herceptin q 3wks or every week, and What type of surgery will I have? Am I going to have a lumpectomy or mastectomy. The mass bigger than what they thought. It's a 3. I called and spoke to [facility] but I just wanted to double check."  01/08 Note from Dr. Donell Beers - pt scheduled tentatively posterior for a lumpectomy without needle localization, sentinel lymph node biopsy, and Port-A-Cath placement.  Should her MRI shows something that would necessitate a change in her surgical plan we will discuss this. If she does have a much larger tumor on MRI, we will proceed with neoadjuvant chemotherapy and only do port placement.  Will review with MD.

## 2012-12-23 NOTE — Telephone Encounter (Signed)
Spoke to pt concerning [facility] from 12/17/12.  Discussed chemo regimen times and time of radiation beginning.  Confirmed future appts and made f/u appt with Dr. Welton Flakes for 1/30.  Informed pt that she will be hearing from the scheduler about echo appt.  Pt denies further needs or concerns.  Encourage pt to call with questions.  Received verbal understanding.  Contact information given.

## 2012-12-24 ENCOUNTER — Telehealth: Payer: Self-pay | Admitting: Oncology

## 2012-12-24 NOTE — Telephone Encounter (Signed)
Per MD notified pt , right now she is scheduled for the surgery first and once we have the final pathology results then we will discuss the treatment in detail. Pt verbalized understanding. No further questions at this time.

## 2012-12-24 NOTE — Telephone Encounter (Signed)
S/w Amy at heart failure clinic and appts for echo/Dr. Bensimhon moved from 2/6 to 1/28 @ 9am for echo and appt w/Dr. Gala Romney to follow. lmonvm for pt re change w/new d/t for 1/28 @ 9am. Pt also given location and code to enter parking lot [phone]). S/w pt yesterday re ched class for 1/16 and lb/KK for 1/31. Pt was aware that I would call back w/new d/t for echo/Dr. Bensimhon. Pt asked to call me back to confirm.

## 2012-12-25 ENCOUNTER — Other Ambulatory Visit: Payer: BC Managed Care – PPO

## 2012-12-25 ENCOUNTER — Encounter: Payer: Self-pay | Admitting: *Deleted

## 2012-12-25 ENCOUNTER — Encounter (HOSPITAL_BASED_OUTPATIENT_CLINIC_OR_DEPARTMENT_OTHER): Payer: Self-pay | Admitting: *Deleted

## 2012-12-25 ENCOUNTER — Encounter (HOSPITAL_BASED_OUTPATIENT_CLINIC_OR_DEPARTMENT_OTHER)
Admission: RE | Admit: 2012-12-25 | Discharge: 2012-12-25 | Disposition: A | Discharge status: Home / Self Care | Payer: BC Managed Care – PPO | Source: Ambulatory Visit | Attending: General Surgery | Admitting: General Surgery

## 2012-12-25 LAB — URINALYSIS, ROUTINE W REFLEX MICROSCOPIC
Bilirubin Urine: NEGATIVE
Glucose, UA: NEGATIVE mg/dL
Hgb urine dipstick: NEGATIVE
Protein, ur: NEGATIVE mg/dL

## 2012-12-25 NOTE — Progress Notes (Signed)
To come in for [facility] dr Donell Beers wanted No heart or resp problems

## 2012-12-31 ENCOUNTER — Encounter (HOSPITAL_COMMUNITY)
Admission: RE | Admit: 2012-12-31 | Discharge: 2012-12-31 | Disposition: A | Discharge status: Home / Self Care | Payer: BC Managed Care – PPO | Source: Ambulatory Visit | Attending: General Surgery | Admitting: General Surgery

## 2012-12-31 ENCOUNTER — Ambulatory Visit (HOSPITAL_COMMUNITY): Payer: BC Managed Care – PPO

## 2012-12-31 ENCOUNTER — Ambulatory Visit (HOSPITAL_BASED_OUTPATIENT_CLINIC_OR_DEPARTMENT_OTHER): Payer: BC Managed Care – PPO | Admitting: Anesthesiology

## 2012-12-31 ENCOUNTER — Encounter (HOSPITAL_BASED_OUTPATIENT_CLINIC_OR_DEPARTMENT_OTHER): Payer: Self-pay | Admitting: General Surgery

## 2012-12-31 ENCOUNTER — Encounter (HOSPITAL_BASED_OUTPATIENT_CLINIC_OR_DEPARTMENT_OTHER)
Admission: RE | Disposition: A | Discharge status: Home / Self Care | Payer: Self-pay | Source: Ambulatory Visit | Attending: General Surgery

## 2012-12-31 ENCOUNTER — Encounter (HOSPITAL_BASED_OUTPATIENT_CLINIC_OR_DEPARTMENT_OTHER): Payer: Self-pay | Admitting: *Deleted

## 2012-12-31 ENCOUNTER — Encounter (HOSPITAL_BASED_OUTPATIENT_CLINIC_OR_DEPARTMENT_OTHER): Payer: Self-pay | Admitting: Anesthesiology

## 2012-12-31 ENCOUNTER — Ambulatory Visit (HOSPITAL_BASED_OUTPATIENT_CLINIC_OR_DEPARTMENT_OTHER)
Admission: RE | Admit: 2012-12-31 | Discharge: 2012-12-31 | Disposition: A | Discharge status: Home / Self Care | Payer: BC Managed Care – PPO | Source: Ambulatory Visit | Attending: General Surgery | Admitting: General Surgery

## 2012-12-31 DIAGNOSIS — C50919 Malignant neoplasm of unspecified site of unspecified female breast: Secondary | ICD-10-CM | POA: Insufficient documentation

## 2012-12-31 DIAGNOSIS — C50419 Malignant neoplasm of upper-outer quadrant of unspecified female breast: Secondary | ICD-10-CM | POA: Insufficient documentation

## 2012-12-31 DIAGNOSIS — Z79899 Other long term (current) drug therapy: Secondary | ICD-10-CM | POA: Insufficient documentation

## 2012-12-31 DIAGNOSIS — Z01812 Encounter for preprocedural laboratory examination: Secondary | ICD-10-CM | POA: Insufficient documentation

## 2012-12-31 DIAGNOSIS — E785 Hyperlipidemia, unspecified: Secondary | ICD-10-CM | POA: Insufficient documentation

## 2012-12-31 DIAGNOSIS — D0590 Unspecified type of carcinoma in situ of unspecified breast: Secondary | ICD-10-CM

## 2012-12-31 HISTORY — PX: BREAST LUMPECTOMY: SHX2

## 2012-12-31 HISTORY — PX: BREAST LUMPECTOMY WITH SENTINEL LYMPH NODE BIOPSY: SHX5597

## 2012-12-31 HISTORY — PX: PORTACATH PLACEMENT: SHX2246

## 2012-12-31 HISTORY — DX: Other specified postprocedural states: Z98.890

## 2012-12-31 HISTORY — DX: Other specified postprocedural states: R11.2

## 2012-12-31 LAB — POCT HEMOGLOBIN-HEMACUE: Hemoglobin: 13 g/dL (ref 12.0–15.0)

## 2012-12-31 SURGERY — BREAST LUMPECTOMY WITH SENTINEL LYMPH NODE BX
Anesthesia: General | Site: Chest | Laterality: Right | Wound class: Clean

## 2012-12-31 MED ORDER — ACETAMINOPHEN 325 MG PO TABS: 650.0000 mg | ORAL_TABLET | ORAL | Status: DC | PRN

## 2012-12-31 MED ORDER — TECHNETIUM TC 99M SULFUR COLLOID FILTERED
1.0000 | Freq: Once | INTRAVENOUS | Status: AC | PRN
  Administered 2012-12-31: 1 via INTRADERMAL

## 2012-12-31 MED ORDER — BUPIVACAINE-EPINEPHRINE 0.5% -1:200000 IJ SOLN
INTRAMUSCULAR | Status: DC | PRN
  Administered 2012-12-31: 20 mL

## 2012-12-31 MED ORDER — ACETAMINOPHEN 650 MG RE SUPP: 650.0000 mg | RECTAL | Status: DC | PRN

## 2012-12-31 MED ORDER — PROPOFOL 10 MG/ML IV BOLUS
INTRAVENOUS | Status: DC | PRN
  Administered 2012-12-31: 200 mg via INTRAVENOUS

## 2012-12-31 MED ORDER — OXYCODONE HCL 5 MG PO TABS
5.0000 mg | ORAL_TABLET | Freq: Once | ORAL | Status: AC | PRN
  Administered 2012-12-31: 5 mg via ORAL

## 2012-12-31 MED ORDER — HYDROMORPHONE HCL PF 1 MG/ML IJ SOLN
0.2500 mg | INTRAMUSCULAR | Status: DC | PRN
  Administered 2012-12-31 (×2): 0.5 mg via INTRAVENOUS

## 2012-12-31 MED ORDER — OXYCODONE-ACETAMINOPHEN 5-325 MG PO TABS: 1.0000 | ORAL_TABLET | ORAL | Status: DC | PRN

## 2012-12-31 MED ORDER — ONDANSETRON HCL 4 MG/2ML IJ SOLN: 4.0000 mg | Freq: Four times a day (QID) | INTRAMUSCULAR | Status: DC | PRN

## 2012-12-31 MED ORDER — OXYCODONE HCL 5 MG PO TABS: 5.0000 mg | ORAL_TABLET | ORAL | Status: DC | PRN

## 2012-12-31 MED ORDER — HEPARIN SOD (PORK) LOCK FLUSH 100 UNIT/ML IV SOLN
INTRAVENOUS | Status: DC | PRN
  Administered 2012-12-31: 500 [IU]

## 2012-12-31 MED ORDER — LIDOCAINE HCL (CARDIAC) 20 MG/ML IV SOLN
INTRAVENOUS | Status: DC | PRN
  Administered 2012-12-31 (×2): 75 mg via INTRAVENOUS

## 2012-12-31 MED ORDER — FENTANYL CITRATE 0.05 MG/ML IJ SOLN
50.0000 ug | INTRAMUSCULAR | Status: DC | PRN
  Administered 2012-12-31: 50 ug via INTRAVENOUS

## 2012-12-31 MED ORDER — SODIUM CHLORIDE 0.9 % IJ SOLN: 3.0000 mL | INTRAMUSCULAR | Status: DC | PRN

## 2012-12-31 MED ORDER — MIDAZOLAM HCL 5 MG/5ML IJ SOLN
INTRAMUSCULAR | Status: DC | PRN
  Administered 2012-12-31: 2 mg via INTRAVENOUS

## 2012-12-31 MED ORDER — HEPARIN (PORCINE) IN NACL 2-0.9 UNIT/ML-% IJ SOLN
INTRAMUSCULAR | Status: DC | PRN
  Administered 2012-12-31: 500 mL

## 2012-12-31 MED ORDER — MIDAZOLAM HCL 2 MG/2ML IJ SOLN
0.5000 mg | INTRAMUSCULAR | Status: DC | PRN
  Administered 2012-12-31: 2 mg via INTRAVENOUS

## 2012-12-31 MED ORDER — OXYCODONE HCL 5 MG/5ML PO SOLN: 5.0000 mg | Freq: Once | ORAL | Status: AC | PRN

## 2012-12-31 MED ORDER — CEFAZOLIN SODIUM-DEXTROSE 2-3 GM-% IV SOLR
2.0000 g | INTRAVENOUS | Status: AC
  Administered 2012-12-31: 2 g via INTRAVENOUS

## 2012-12-31 MED ORDER — CHLORHEXIDINE GLUCONATE 4 % EX LIQD: 1.0000 "application " | Freq: Once | CUTANEOUS | Status: DC

## 2012-12-31 MED ORDER — EPHEDRINE SULFATE 50 MG/ML IJ SOLN
INTRAMUSCULAR | Status: DC | PRN
  Administered 2012-12-31: 15 mg via INTRAVENOUS

## 2012-12-31 MED ORDER — LACTATED RINGERS IV SOLN
INTRAVENOUS | Status: DC
  Administered 2012-12-31: 12:00:00 via INTRAVENOUS
  Administered 2012-12-31: 20 mL/h via INTRAVENOUS
  Administered 2012-12-31: 14:00:00 via INTRAVENOUS

## 2012-12-31 MED ORDER — SODIUM CHLORIDE 0.9 % IV SOLN: 250.0000 mL | INTRAVENOUS | Status: DC | PRN

## 2012-12-31 MED ORDER — ACETAMINOPHEN 10 MG/ML IV SOLN
1000.0000 mg | Freq: Once | INTRAVENOUS | Status: AC
  Administered 2012-12-31: 1000 mg via INTRAVENOUS

## 2012-12-31 MED ORDER — SODIUM CHLORIDE 0.9 % IJ SOLN: 3.0000 mL | Freq: Two times a day (BID) | INTRAMUSCULAR | Status: DC

## 2012-12-31 MED ORDER — SUCCINYLCHOLINE CHLORIDE 20 MG/ML IJ SOLN
INTRAMUSCULAR | Status: DC | PRN
  Administered 2012-12-31: 100 mg via INTRAVENOUS

## 2012-12-31 MED ORDER — FENTANYL CITRATE 0.05 MG/ML IJ SOLN
INTRAMUSCULAR | Status: DC | PRN
  Administered 2012-12-31: 100 ug via INTRAVENOUS

## 2012-12-31 MED ORDER — SCOPOLAMINE 1 MG/3DAYS TD PT72
1.0000 | MEDICATED_PATCH | TRANSDERMAL | Status: DC
  Administered 2012-12-31: 1.5 mg via TRANSDERMAL

## 2012-12-31 SURGICAL SUPPLY — 80 items
ADH SKN CLS APL DERMABOND .7 (GAUZE/BANDAGES/DRESSINGS) ×2
BAG DECANTER FOR FLEXI CONT (MISCELLANEOUS) ×3 IMPLANT
BANDAGE GAUZE ELAST BULKY 4 IN (GAUZE/BANDAGES/DRESSINGS) ×3 IMPLANT
BINDER BREAST LRG (GAUZE/BANDAGES/DRESSINGS) IMPLANT
BINDER BREAST MEDIUM (GAUZE/BANDAGES/DRESSINGS) IMPLANT
BINDER BREAST XLRG (GAUZE/BANDAGES/DRESSINGS) IMPLANT
BINDER BREAST XXLRG (GAUZE/BANDAGES/DRESSINGS) ×1 IMPLANT
BLADE HEX COATED 2.75 (ELECTRODE) ×3 IMPLANT
BLADE SURG 10 STRL SS (BLADE) ×3 IMPLANT
BLADE SURG 11 STRL SS (BLADE) ×3 IMPLANT
BLADE SURG 15 STRL LF DISP TIS (BLADE) ×2 IMPLANT
BLADE SURG 15 STRL SS (BLADE) ×3
CANISTER SUCTION 1200CC (MISCELLANEOUS) ×3 IMPLANT
CHLORAPREP W/TINT 26ML (MISCELLANEOUS) ×3 IMPLANT
CLIP TI LARGE 6 (CLIP) IMPLANT
CLIP TI MEDIUM 6 (CLIP) ×4 IMPLANT
CLIP TI WIDE RED SMALL 6 (CLIP) ×1 IMPLANT
CLOTH BEACON ORANGE TIMEOUT ST (SAFETY) ×2 IMPLANT
COVER MAYO STAND STRL (DRAPES) ×2 IMPLANT
COVER PROBE W GEL 5X96 (DRAPES) ×3 IMPLANT
COVER TABLE BACK 60X90 (DRAPES) ×3 IMPLANT
DECANTER SPIKE VIAL GLASS SM (MISCELLANEOUS) IMPLANT
DERMABOND ADVANCED (GAUZE/BANDAGES/DRESSINGS) ×1
DERMABOND ADVANCED .7 DNX12 (GAUZE/BANDAGES/DRESSINGS) ×2 IMPLANT
DEVICE DUBIN W/COMP PLATE 8390 (MISCELLANEOUS) IMPLANT
DRAIN CHANNEL 19F RND (DRAIN) IMPLANT
DRAPE C-ARM 42X72 X-RAY (DRAPES) ×3 IMPLANT
DRAPE LAPAROTOMY TRNSV 102X78 (DRAPE) ×3 IMPLANT
DRAPE UTILITY XL STRL (DRAPES) ×2 IMPLANT
DRSG PAD ABDOMINAL 8X10 ST (GAUZE/BANDAGES/DRESSINGS) IMPLANT
DRSG TEGADERM 4X4.75 (GAUZE/BANDAGES/DRESSINGS) IMPLANT
ELECT REM PT RETURN 9FT ADLT (ELECTROSURGICAL) ×3
ELECTRODE REM PT RTRN 9FT ADLT (ELECTROSURGICAL) ×2 IMPLANT
EVACUATOR SILICONE 100CC (DRAIN) IMPLANT
GLOVE BIO SURGEON STRL SZ 6 (GLOVE) ×3 IMPLANT
GLOVE BIOGEL M 8.0 STRL (GLOVE) ×1 IMPLANT
GLOVE BIOGEL PI IND STRL 6.5 (GLOVE) ×2 IMPLANT
GLOVE BIOGEL PI IND STRL 8 (GLOVE) IMPLANT
GLOVE BIOGEL PI INDICATOR 6.5 (GLOVE) ×1
GLOVE BIOGEL PI INDICATOR 8 (GLOVE) ×1
GLOVE SKINSENSE NS SZ7.0 (GLOVE) ×1
GLOVE SKINSENSE STRL SZ7.0 (GLOVE) IMPLANT
GOWN PREVENTION PLUS XLARGE (GOWN DISPOSABLE) ×3 IMPLANT
GOWN PREVENTION PLUS XXLARGE (GOWN DISPOSABLE) ×4 IMPLANT
IV HEPARIN 1000UNITS/500ML (IV SOLUTION) ×3 IMPLANT
KIT MARKER MARGIN INK (KITS) ×1 IMPLANT
KIT PORT POWER 8FR ISP CVUE (Catheter) ×1 IMPLANT
NDL HYPO 25X1 1.5 SAFETY (NEEDLE) ×4 IMPLANT
NDL SAFETY ECLIPSE 18X1.5 (NEEDLE) ×2 IMPLANT
NEEDLE HYPO 18GX1.5 SHARP (NEEDLE) ×3
NEEDLE HYPO 25X1 1.5 SAFETY (NEEDLE) ×6 IMPLANT
NS IRRIG 1000ML POUR BTL (IV SOLUTION) IMPLANT
PACK BASIN DAY SURGERY FS (CUSTOM PROCEDURE TRAY) ×3 IMPLANT
PACK UNIVERSAL I (CUSTOM PROCEDURE TRAY) ×3 IMPLANT
PENCIL BUTTON HOLSTER BLD 10FT (ELECTRODE) ×3 IMPLANT
PIN SAFETY STERILE (MISCELLANEOUS) IMPLANT
SLEEVE SCD COMPRESS KNEE MED (MISCELLANEOUS) ×3 IMPLANT
SPONGE GAUZE 4X4 12PLY (GAUZE/BANDAGES/DRESSINGS) ×1 IMPLANT
SPONGE LAP 18X18 X RAY DECT (DISPOSABLE) ×3 IMPLANT
SPONGE LAP 4X18 X RAY DECT (DISPOSABLE) IMPLANT
STAPLER VISISTAT 35W (STAPLE) IMPLANT
STOCKINETTE IMPERVIOUS LG (DRAPES) ×3 IMPLANT
STRIP CLOSURE SKIN 1/2X4 (GAUZE/BANDAGES/DRESSINGS) ×3 IMPLANT
SUT MNCRL AB 4-0 PS2 18 (SUTURE) ×3 IMPLANT
SUT MON AB 4-0 PC3 18 (SUTURE) ×4 IMPLANT
SUT PROLENE 2 0 SH DA (SUTURE) ×6 IMPLANT
SUT SILK 2 0 SH (SUTURE) IMPLANT
SUT VIC AB 2-0 SH 27 (SUTURE)
SUT VIC AB 2-0 SH 27XBRD (SUTURE) IMPLANT
SUT VIC AB 3-0 SH 27 (SUTURE) ×6
SUT VIC AB 3-0 SH 27X BRD (SUTURE) ×4 IMPLANT
SUT VICRYL 3-0 CR8 SH (SUTURE) ×1 IMPLANT
SYR 5ML LUER SLIP (SYRINGE) ×3 IMPLANT
SYR BULB 3OZ (MISCELLANEOUS) IMPLANT
SYR CONTROL 10ML LL (SYRINGE) ×6 IMPLANT
TOWEL OR 17X24 6PK STRL BLUE (TOWEL DISPOSABLE) ×4 IMPLANT
TOWEL OR NON WOVEN STRL DISP B (DISPOSABLE) ×2 IMPLANT
TUBE CONNECTING 20X1/4 (TUBING) ×3 IMPLANT
WATER STERILE IRR 1000ML POUR (IV SOLUTION) ×2 IMPLANT
YANKAUER SUCT BULB TIP NO VENT (SUCTIONS) ×3 IMPLANT

## 2012-12-31 NOTE — Anesthesia Postprocedure Evaluation (Signed)
Anesthesia Post Note  Patient: Julia Zuniga  Procedure(s) Performed: Procedure(s) (LRB): BREAST LUMPECTOMY WITH SENTINEL LYMPH NODE BX (Right) INSERTION PORT-A-CATH (Left)  Anesthesia type: general  Patient location: PACU  Post pain: Pain level controlled  Post assessment: Patient's Cardiovascular Status Stable  Last Vitals:  Filed Vitals:   12/31/12 1630  BP: 120/76  Pulse: 97  Temp:   Resp: 11    Post vital signs: Reviewed and stable  Level of consciousness: sedated  Complications: No apparent anesthesia complications

## 2012-12-31 NOTE — Progress Notes (Signed)
Dr. Donell Beers called and read portable chest xray report.

## 2012-12-31 NOTE — Interval H&P Note (Signed)
History and Physical Interval Note:  12/31/2012 1:05 PM  Julia Zuniga  has presented today for surgery, with the diagnosis of right breast cancer  The various methods of treatment have been discussed with the patient and family. After consideration of risks, benefits and other options for treatment, the patient has consented to  Procedure(s) (LRB) with comments: BREAST LUMPECTOMY WITH SENTINEL LYMPH NODE BX (Right) INSERTION PORT-A-CATH (N/A) as a surgical intervention .  The patient's history has been reviewed, patient examined, no change in status, stable for surgery.  I have reviewed the patient's chart and labs.  Questions were answered to the patient's satisfaction.     Mackenzi Krogh

## 2012-12-31 NOTE — Progress Notes (Signed)
nuc med injection done by radiology tech. Versed and Fentanyl given prior to injections. Pt tol procedure well. Will call family back to bedside to visit with patient while waiting for OR team

## 2012-12-31 NOTE — Anesthesia Preprocedure Evaluation (Signed)
Anesthesia Evaluation  Patient identified by MRN, date of birth, ID band Patient awake    Reviewed: Allergy & Precautions, H&P , NPO status , Patient's Chart, lab work & pertinent test results  History of Anesthesia Complications (+) PONV  Airway Mallampati: II TM Distance: >3 FB Neck ROM: Full    Dental No notable dental hx. (+) Teeth Intact and Dental Advisory Given   Pulmonary neg pulmonary ROS,  breath sounds clear to auscultation  Pulmonary exam normal       Cardiovascular negative cardio ROS  Rhythm:Regular Rate:Normal     Neuro/Psych negative neurological ROS  negative psych ROS   GI/Hepatic Neg liver ROS, hiatal hernia,   Endo/Other  negative endocrine ROS  Renal/GU negative Renal ROS  negative genitourinary   Musculoskeletal   Abdominal   Peds  Hematology negative hematology ROS (+)   Anesthesia Other Findings   Reproductive/Obstetrics negative OB ROS                           Anesthesia Physical Anesthesia Plan  ASA: II  Anesthesia Plan: General   Post-op Pain Management:    Induction: Intravenous  Airway Management Planned: LMA  Additional Equipment:   Intra-op Plan:   Post-operative Plan: Extubation in OR  Informed Consent: I have reviewed the patients History and Physical, chart, labs and discussed the procedure including the risks, benefits and alternatives for the proposed anesthesia with the patient or authorized representative who has indicated his/her understanding and acceptance.   Dental advisory given  Plan Discussed with: CRNA  Anesthesia Plan Comments:         Anesthesia Quick Evaluation

## 2012-12-31 NOTE — H&P (View-Only) (Signed)
Chief complaint:  Right breast cancer.    HISTORY: Patient is a 52 year old female who presents with a palpable right breast mass. This is seen on mammogram and ultrasound. On Wednesday it appeared a 1.6 cm and another was 2.5. She has been able to feel it. She has no family history of breast cancer but did have a father with stomach cancer at age 39, a maternal grandmother with skin cancer,  maternal cousin with Hodgkin's, and a paternal grandfather with lung cancer.  She has no personal history of cancer. 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 She has not ever been a smoker and does drink around 1-2 beers per week. She does not use any illicit drugs. Menarche was at 62. She is no longer having periods and ceased having periods in 2010. She has not ever had any hormone replacement therapy. She carried 3 children to term and did use hormone shots for contraception but does not recall the period of time. Other than colonoscopy her health screening is up to date.  Past Medical History  Diagnosis Date  . Breast cancer   . Hyperlipidemia   . Diverticulitis   . Hernia, hiatal   . Heartburn   . Anxiety   . Hot flashes     Past Surgical History  Procedure Date  . Cesarean section     x 3  . Tubal ligation   . Tonsillectomy and adenoidectomy     Current Outpatient Prescriptions  Medication Sig Dispense Refill  . ibuprofen (ADVIL,MOTRIN) 200 MG tablet Take 200 mg by mouth as needed.      Marland Kitchen LORazepam (ATIVAN) 0.5 MG tablet Take 0.5 mg by mouth as needed.      Marland Kitchen omeprazole (PRILOSEC) 20 MG capsule Take 20 mg by mouth daily.      . Probiotic Product (PROBIOTIC DAILY PO) Take 1 each by mouth.         Allergies  Allergen Reactions  . Codeine   . Medralone (Methylprednisolone Acetate)      Family History  Problem Relation Age of Onset  . Stomach cancer Father   . Lung cancer Maternal Uncle   . Skin cancer Maternal Grandmother   . Lung cancer Maternal Grandfather   . Lymphoma Cousin       History   Social History  . Marital Status: Single    Spouse Name: N/A    Number of Children: N/A  . Years of Education: N/A   Social History Main Topics  . Smoking status: Never Smoker   . Smokeless tobacco: Not on file  . Alcohol Use: 1.2 oz/week    2 Cans of beer per week  . Drug Use: No  . Sexually Active: Yes     REVIEW OF SYSTEMS - PERTINENT POSITIVES ONLY: 12 point review of systems negative other than HPI and PMH except for fatigue, night sweats, difficulty sleeping, palpitations, heartburn, anxiety  EXAM: Wt Readings from Last 3 Encounters:  12/17/12 234 lb 4.8 oz (106.278 kg)   Temp Readings from Last 3 Encounters:  12/17/12 98 F (36.7 C)    BP Readings from Last 3 Encounters:  12/17/12 150/102   Pulse Readings from Last 3 Encounters:  12/17/12 85    Gen:  No acute distress.  Well nourished and well groomed.   Neurological: Alert and oriented to person, place, and time. Coordination normal.  Head: Normocephalic and atraumatic.  Eyes: Conjunctivae are normal. Pupils are equal, round, and reactive to light. No scleral  icterus.  Neck: Normal range of motion. Neck supple. No tracheal deviation or thyromegaly present.  Cardiovascular: Normal rate, regular rhythm, normal heart sounds and intact distal pulses.  Exam reveals no gallop and no friction rub.  No murmur heard. Respiratory: Effort normal.  No respiratory distress. No chest wall tenderness. Breath sounds normal.  No wheezes, rales or rhonchi.  Breast:  Breasts are reasonably symmetric.  Right breast has 2 cm mass palpable at 11:30-12 oclock.  No LAD present.  No nipple retraction or skin dimpling is seen.   GI: Soft. Bowel sounds are normal. The abdomen is soft and nontender.  There is no rebound and no guarding.  Musculoskeletal: Normal range of motion. Extremities are nontender.  Lymphadenopathy: No cervical, preauricular, postauricular or axillary adenopathy is present Skin: Skin is warm and  dry. No rash noted. No diaphoresis. No erythema. No pallor. No clubbing, cyanosis, or edema.   Psychiatric: Normal mood and affect. Behavior is normal. Judgment and thought content normal.    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 - SEE COMMENT.  RADIOLOGY RESULTS: See E-Chart or I-Site for most recent results.  Images and reports are reviewed. Mammo/ultrasound IMPRESSION:  1.6 cm irregular mass located within the right breast at the 11:30  o'clock position 7 cm from the nipple corresponding to the  mammographic finding. Tissue sampling is recommended. I have  discussed ultrasound-guided core biopsy. The patient would like to  proceed with this. This will be performed and reported separately.  RECOMMENDATION:  Right breast ultrasound guided core biopsy.  I have discussed the findings and recommendations with the patient.  Results were also provided in writing at the conclusion of the  visit.  BI-RADS CATEGORY 5: Highly suggestive of malignancy - appropriate  action should be taken.     ASSESSMENT AND PLAN: Cancer of upper-outer quadrant of female breast Patient has clinical T1 N0 right breast cancer with a palpable mass at 12:00.  Based on the size discrepancy on mammogram versus ultrasound, she is requesting an MRI. She has hormone receptor negative disease with positive Herceptin and relatively high Ki-67.  I will tentatively posterior for a lumpectomy without needle localization, sentinel lymph node biopsy, and Port-A-Cath placement.   The surgery and incisions were discussed with the patient as well as the risks of surgery. I discussed bleeding, infection, damage to adjacent structures, pneumothorax, need for additional surgeries, and possible cancer recurrence.  The patient understands and wishes to proceed. Should her MRI shows something that would necessitate a change in her  surgical plan we will discuss this. If she does have a much larger tumor on MRI, we will proceed with neoadjuvant chemotherapy and only do port placement.  45 minutes were spent in counseling, evaluation, examination, and coordination of care.       Maudry Diego MD Surgical Oncology, General and Endocrine Surgery [facility]      Visit Diagnoses: 1. Cancer of upper-outer quadrant of female breast     Primary Care Physician: Tonye Pearson, MD  Drue Second, MD Neita Goodnight

## 2012-12-31 NOTE — Transfer of Care (Signed)
Immediate Anesthesia Transfer of Care Note  Patient: Julia Zuniga  Procedure(s) Performed: Procedure(s) (LRB) with comments: BREAST LUMPECTOMY WITH SENTINEL LYMPH NODE BX (Right) INSERTION PORT-A-CATH (Left) - Left Subclavian Vein  Patient Location: PACU  Anesthesia Type:General  Level of Consciousness: awake, alert  and oriented  Airway & Oxygen Therapy: Patient Spontanous Breathing and Patient connected to face mask oxygen  Post-op Assessment: Report given to PACU RN and Post -op Vital signs reviewed and stable  Post vital signs: Reviewed and stable  Complications: No apparent anesthesia complications

## 2012-12-31 NOTE — Op Note (Signed)
Right Breast Lumpectomy with Sentinel Node Biopsy Procedure Note and left subclavian ClearVue 8 Fr MRI safe port a cath  Indications: This patient presents with history of right breast cancer with clinically negative axillary lymph node exam.  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 The risks, benefits, complications, treatment options, and expected outcomes were discussed with the patient. The possibilities of reaction to medication, pulmonary aspiration, bleeding, infection, the need for additional procedures, failure to diagnose a condition, and creating a complication requiring transfusion or operation were discussed with the patient. The patient concurred with the proposed plan, giving informed consent.  The site of surgery properly noted/marked. The patient was taken to Operating Room # 4, identified as Julia Zuniga and the procedure verified as Breast Lumpectomy and Sentinel Node Biopsy. A Time Out was held and the above information confirmed.  After induction of anesthesia, the bilateral arm, breast, and chest were prepped and draped in standard fashion.    Local anesthetic was administered over this   area at the angle of the clavicle.  The vein was accessed with 2 passes of the needle. There was good venous return and the wire passed easily with no ectopy.   Fluoroscopy was used to confirm that the wire was in the vena cava.      The patient was placed back level and the area for the pocket was anethetized   with local anesthetic.  A 3-cm transverse incision was made with a #15   blade. The wire insertion site was incorporated into the incision. Cautery was used to  divide the subcutaneous tissues down to the   pectoralis muscle.  An [employer] retractor was used to elevate the skin   while a pocket was created on top of the pectoralis fascia.  The port   was placed into the pocket to confirm that it was of adequate size.  The   catheter was preattached to the port.  The port was then secured to the   pectoralis fascia with four 2-0 Prolene sutures.  These were clamped and   not tied down yet.     The catheter was placed along the wire to determine what length it should be to be in the SVC.  The catheter was cut at 21 cm.  The tunneler sheath and dilator were passed over the wire and the dilator and wire were removed.  The catheter was advanced through the tunneler sheath and the tunneler sheath was pulled away.  Care was taken to keep the catheter in the tunneler sheath as this occurred. This was advanced and the tunneler sheath was removed.  There was good venous   return and easy flush of the catheter.  The Prolene sutures were tied   down to the pectoral fascia.  The skin was reapproximated using 3-0   Vicryl interrupted deep dermal sutures.    Fluoroscopy was used to re-confirm good position of the catheter.  The skin   was then closed using 4-0 Monocryl in a subcuticular fashion.  The port was flushed with concentrated heparin flush as well.  The wounds were then cleaned, dried, and dressed with Dermabond.   Needle, sponge, and instrument counts were correct.   The lumpectomy was performed  by creating an oblique incision over the upper inner quadrant of the breast.  Dissection was carried down to the pectoral fascia.  Orientation sutures were placed and the skin and pectoralis margins were inked.  Hemostasis was achieved with cautery.  The specimen was marked with the margin marker paint kit.  The wound was irrigated and closed with a 3-0 Vicryl deep dermal interrupted and a 4-0 Monocryl subcuticular closure in layers.  Using a hand-held gamma probe,  axillary sentinel nodes were identified transcutaneously.  An oblique incision was created below the axillary hairline.  Dissection was carried through the clavipectoral fascia.  2 level 2 axillary sentinel nodes were removed and submitted to pathology. The axillary incision was closed with a 3-0 Vicryl deep dermal interrupted and a 4-0 Vicryl subcuticular closure in layers.      Sterile dressings were applied. At the end of the operation, all sponge, instrument, and needle counts were correct.  Findings: grossly clear surgical margins and 2 sentinel nodes.  Left [place] port catheter tip at SVC/RA junction.  Estimated Blood Loss:  Minimal         Drains: none         Specimens: right breast lumpectomy and 2 axillary sentinel lymph nodes.  #1 hot, cps 3900, #2 hot, cps 3100.  Background 12 cps                Complications:  None; patient tolerated the procedure well.         Disposition: PACU - hemodynamically stable.         Condition: stable

## 2012-12-31 NOTE — Anesthesia Procedure Notes (Signed)
Procedure Name: Intubation Date/Time: 12/31/2012 1:25 PM Performed by: Zenia Resides D Pre-anesthesia Checklist: Patient identified, Emergency Drugs available, Suction available and Patient being monitored Patient Re-evaluated:Patient Re-evaluated prior to inductionOxygen Delivery Method: Circle System Utilized Preoxygenation: Pre-oxygenation with 100% oxygen Intubation Type: IV induction, Cricoid Pressure applied and Rapid sequence Ventilation: Mask ventilation without difficulty Tube type: Oral Number of attempts: 1 Airway Equipment and Method: stylet and oral airway Placement Confirmation: ETT inserted through vocal cords under direct vision,  positive ETCO2 and breath sounds checked- equal and bilateral Secured at: 22 cm Tube secured with: Tape Dental Injury: Teeth and Oropharynx as per pre-operative assessment

## 2013-01-01 ENCOUNTER — Encounter (HOSPITAL_BASED_OUTPATIENT_CLINIC_OR_DEPARTMENT_OTHER): Payer: Self-pay | Admitting: General Surgery

## 2013-01-05 ENCOUNTER — Encounter (INDEPENDENT_AMBULATORY_CARE_PROVIDER_SITE_OTHER): Payer: Self-pay | Admitting: General Surgery

## 2013-01-05 ENCOUNTER — Telehealth (INDEPENDENT_AMBULATORY_CARE_PROVIDER_SITE_OTHER): Payer: Self-pay | Admitting: General Surgery

## 2013-01-05 NOTE — Telephone Encounter (Signed)
Pt called for path results (from surgery last Wednesday, 12/31/12.)  Understands Dr. Donell Beers needs to review and "sign off" to release them.  She is very anxious to hear the results.  Has echocardiogram in the morning, but would like a call from Dr. Donell Beers tomorrow.  She will call back tomorrow if she hasn't heard.

## 2013-01-06 ENCOUNTER — Ambulatory Visit (HOSPITAL_BASED_OUTPATIENT_CLINIC_OR_DEPARTMENT_OTHER)
Admission: RE | Admit: 2013-01-06 | Discharge: 2013-01-06 | Disposition: A | Discharge status: Home / Self Care | Payer: BC Managed Care – PPO | Source: Ambulatory Visit | Attending: Internal Medicine | Admitting: Internal Medicine

## 2013-01-06 ENCOUNTER — Encounter (HOSPITAL_COMMUNITY): Payer: Self-pay

## 2013-01-06 ENCOUNTER — Ambulatory Visit (HOSPITAL_COMMUNITY)
Admission: RE | Admit: 2013-01-06 | Discharge: 2013-01-06 | Disposition: A | Discharge status: Home / Self Care | Payer: BC Managed Care – PPO | Source: Ambulatory Visit | Attending: Internal Medicine | Admitting: Internal Medicine

## 2013-01-06 ENCOUNTER — Telehealth (INDEPENDENT_AMBULATORY_CARE_PROVIDER_SITE_OTHER): Payer: Self-pay | Admitting: General Surgery

## 2013-01-06 VITALS — BP 152/90 | HR 90 | Ht 66.0 in | Wt 234.8 lb

## 2013-01-06 DIAGNOSIS — C50419 Malignant neoplasm of upper-outer quadrant of unspecified female breast: Secondary | ICD-10-CM

## 2013-01-06 DIAGNOSIS — I079 Rheumatic tricuspid valve disease, unspecified: Secondary | ICD-10-CM | POA: Insufficient documentation

## 2013-01-06 DIAGNOSIS — E669 Obesity, unspecified: Secondary | ICD-10-CM | POA: Insufficient documentation

## 2013-01-06 DIAGNOSIS — E785 Hyperlipidemia, unspecified: Secondary | ICD-10-CM

## 2013-01-06 DIAGNOSIS — Z01818 Encounter for other preprocedural examination: Secondary | ICD-10-CM | POA: Insufficient documentation

## 2013-01-06 DIAGNOSIS — I10 Essential (primary) hypertension: Secondary | ICD-10-CM

## 2013-01-06 DIAGNOSIS — C50919 Malignant neoplasm of unspecified site of unspecified female breast: Secondary | ICD-10-CM | POA: Insufficient documentation

## 2013-01-06 DIAGNOSIS — E7800 Pure hypercholesterolemia, unspecified: Secondary | ICD-10-CM | POA: Insufficient documentation

## 2013-01-06 DIAGNOSIS — I369 Nonrheumatic tricuspid valve disorder, unspecified: Secondary | ICD-10-CM

## 2013-01-06 MED ORDER — CARVEDILOL 6.25 MG PO TABS: 6.2500 mg | ORAL_TABLET | Freq: Two times a day (BID) | ORAL | Status: DC

## 2013-01-06 MED ORDER — ATORVASTATIN CALCIUM 40 MG PO TABS: 40.0000 mg | ORAL_TABLET | Freq: Every day | ORAL | Status: DC

## 2013-01-06 NOTE — Telephone Encounter (Signed)
She does need a port

## 2013-01-06 NOTE — Progress Notes (Signed)
Referring Physician: Dr. Welton Flakes Primary Care: Dr. Ellamae Sia Primary Cardiologist: new  HPI: Julia Zuniga ia a 52 y.o. 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 HER-2/neu was amplified at 3.88.  S/p rt lumpectomy on 12/31/12.  +family history of CAD.  She has been referred to the cardio-oncology treatment by Dr. Welton Flakes for follow up while receiving chemotherapy consisting of taxotere, carboplatinum and herceptin.  She will follow up with Dr. Welton Flakes on 01/08/13 to discuss start dates for chemotherapy.  She knows she will get herceptin for a year.    Echos: 01/06/13: EF 55-60%, lat s' 9.2  She feels a little overwhelmed with everything that is going on but in good spirits.  She has never had any prior cardiac work up.  She does occasionally have palpitations but denies dizziness or syncope.  No chest pain.  No edema.  Cholesterol in December 117 TG, HDL 50, LDL 170, TC 242.  Review of Systems: [y] = yes, [ ]  = no   General: Weight gain [ ] ; Weight loss [ ] ; Anorexia [ ] ; Fatigue [ ] ; Fever [ ] ; Chills [ ] ; Weakness [ ]   Cardiac: Chest pain/pressure [ ] ; Resting SOB [ ] ; Exertional SOB [ ] ; Orthopnea [ ] ; Pedal Edema [ ] ; Palpitations [ ] ; Syncope [ ] ; Presyncope [ ] ; Paroxysmal nocturnal dyspnea[ ]   Pulmonary: Cough [ ] ; Wheezing[ ] ; Hemoptysis[ ] ; Sputum [ ] ; Snoring [ ]   GI: Vomiting[ ] ; Dysphagia[ ] ; Melena[ ] ; Hematochezia [ ] ; Heartburn[ ] ; Abdominal pain [ ] ; Constipation [ ] ; Diarrhea [ ] ; BRBPR [ ]   GU: Hematuria[ ] ; Dysuria [ ] ; Nocturia[ ]   Vascular: Pain in legs with walking [ ] ; Pain in feet with lying flat [ ] ; Non-healing sores [ ] ; Stroke [ ] ; TIA [ ] ; Slurred speech [ ] ;  Neuro: Headaches[ ] ; Vertigo[ ] ; Seizures[ ] ; Paresthesias[ ] ;Blurred vision [ ] ; Diplopia [ ] ; Vision changes [ ]   Ortho/Skin: Arthritis [ ] ; Joint pain [  ]; Muscle pain [ ] ; Joint swelling [ ] ; Back Pain [ ] ; Rash [ ]   Psych: Depression[ ] ; Anxiety[ ]   Heme: Bleeding problems [ ] ; Clotting disorders [ ] ; Anemia [ ]   Endocrine: Diabetes [ ] ; Thyroid dysfunction[ ]    Past Medical History  Diagnosis Date  . Breast cancer   . Hyperlipidemia   . Diverticulitis   . Hernia, hiatal   . Heartburn   . Anxiety   . Hot flashes   . PONV (postoperative nausea and vomiting)     Current Outpatient Prescriptions  Medication Sig Dispense Refill  . ibuprofen (ADVIL,MOTRIN) 200 MG tablet Take 200 mg by mouth as needed.      Marland Kitchen LORazepam (ATIVAN) 0.5 MG tablet Take 0.5 mg by mouth as needed.      Marland Kitchen omeprazole (PRILOSEC) 20 MG capsule Take 20 mg by mouth daily.      . Probiotic Product (PROBIOTIC DAILY PO) Take 1 each by mouth.      . oxyCODONE-acetaminophen (ROXICET) 5-325 MG per tablet Take 1-2 tablets by mouth every 4 (four) hours as needed for pain.  30 tablet  0    Allergies  Allergen Reactions  . Codeine Swelling  . Medralone (Methylprednisolone Acetate)     "coming out of skin"     History   Social History  . Marital Status: Married  Spouse Name: N/A    Number of Children: N/A  . Years of Education: N/A   Occupational History  . Not on file.   Social History Main Topics  . Smoking status: Never Smoker   . Smokeless tobacco: Not on file  . Alcohol Use: 1.2 oz/week    2 Cans of beer per week     Comment: occ  . Drug Use: No  . Sexually Active: Yes   Other Topics Concern  . Not on file   Social History Narrative  . No narrative on file    Family History  Problem Relation Age of Onset  . Stomach cancer Father   . Lung cancer Maternal Uncle   . Skin cancer Maternal Grandmother   . Lung cancer Maternal Grandfather   . Lymphoma Cousin   Grandmother AS s/p AVR in 41s Great uncle MI at 83 Mother HTN, HL and s/p CABGx4 in 07' (mid 35s)  PHYSICAL EXAM: Filed Vitals:   01/06/13 0950  BP: 152/90  Pulse: 90  Height:  5\' 6"  (1.676 m)  Weight: 234 lb 12.8 oz (106.505 kg)  SpO2: 98%    General:  Well appearing. No respiratory difficulty HEENT: normal Neck: supple. no JVD. Carotids 2+ bilat; no bruits. No lymphadenopathy or thryomegaly appreciated. Cor: PMI nondisplaced. Regular rate & rhythm. No rubs, gallops or murmurs. Lungs: clear Abdomen: obese, soft, nontender, nondistended. No hepatosplenomegaly. No bruits or masses. Good bowel sounds. Extremities: no cyanosis, clubbing, rash, edema Neuro: alert & oriented x 3, cranial nerves grossly intact. moves all 4 extremities w/o difficulty. Affect pleasant.    ASSESSMENT & PLAN:

## 2013-01-06 NOTE — Telephone Encounter (Signed)
Discussed with pt that lymph nodes are negative and margins are negative, however there is a close margin.  Will discuss with Dr. Welton Flakes and Dr. Michell Heinrich.

## 2013-01-06 NOTE — Progress Notes (Signed)
Echocardiogram 2D Echocardiogram has been performed.  Julia Zuniga 01/06/2013, 2:39 PM

## 2013-01-06 NOTE — Telephone Encounter (Signed)
Technically the "new" guidelines that Julia Zuniga presented that haven't been released are "negative is negative" even with DCIS but I always feel more comfortable with more margin in young patients like this.  Does she have to go back for a port any way?  SW

## 2013-01-06 NOTE — Telephone Encounter (Signed)
Left message on machine for pt to call back.

## 2013-01-06 NOTE — Patient Instructions (Addendum)
Follow up 3 months with echo.  Start carvedilol and lipitor

## 2013-01-06 NOTE — Telephone Encounter (Signed)
reexcise

## 2013-01-07 ENCOUNTER — Other Ambulatory Visit (INDEPENDENT_AMBULATORY_CARE_PROVIDER_SITE_OTHER): Payer: Self-pay | Admitting: General Surgery

## 2013-01-07 ENCOUNTER — Encounter (HOSPITAL_COMMUNITY): Payer: Self-pay | Admitting: *Deleted

## 2013-01-07 ENCOUNTER — Telehealth (INDEPENDENT_AMBULATORY_CARE_PROVIDER_SITE_OTHER): Payer: Self-pay | Admitting: General Surgery

## 2013-01-07 DIAGNOSIS — I10 Essential (primary) hypertension: Secondary | ICD-10-CM | POA: Insufficient documentation

## 2013-01-07 DIAGNOSIS — E785 Hyperlipidemia, unspecified: Secondary | ICD-10-CM | POA: Insufficient documentation

## 2013-01-07 NOTE — Assessment & Plan Note (Signed)
Will start carvedilol 6.25 mg BID as she is getting ready to start herceptin therapy and cardiovascular risk reduction is important to prevent cardiotoxicity.

## 2013-01-07 NOTE — Assessment & Plan Note (Signed)
Recent blood work shows dyslipidemia and she has not yet been treated, will start lipitor.  Follow up lipids in 6 months.

## 2013-01-07 NOTE — Progress Notes (Signed)
01-07-13 1515 Need MD order entry in Epic for 01-14-13 surgery.W. Kennon Portela

## 2013-01-07 NOTE — Assessment & Plan Note (Signed)
Have reviewed the purpose of cardio-onc clinic while on herceptin therapy and reviewed signs/symptoms of heart failure.  Patient's echo has been reviewed and she is cleared to start herceptin therapy with follow up echos.  As above, will start carvedilol for hypertension.

## 2013-01-07 NOTE — Progress Notes (Signed)
01-07-13 Instructed as SDS / Hibiclens soap shower as preparing for surgery. Teach back method used. Will have responsible driver.

## 2013-01-07 NOTE — Telephone Encounter (Signed)
Discussed going back for reexcision of margins.

## 2013-01-08 ENCOUNTER — Other Ambulatory Visit (HOSPITAL_BASED_OUTPATIENT_CLINIC_OR_DEPARTMENT_OTHER): Payer: BC Managed Care – PPO | Admitting: Lab

## 2013-01-08 ENCOUNTER — Ambulatory Visit (HOSPITAL_BASED_OUTPATIENT_CLINIC_OR_DEPARTMENT_OTHER): Payer: BC Managed Care – PPO | Admitting: Oncology

## 2013-01-08 ENCOUNTER — Other Ambulatory Visit: Payer: Self-pay | Admitting: Emergency Medicine

## 2013-01-08 ENCOUNTER — Encounter: Payer: Self-pay | Admitting: Oncology

## 2013-01-08 VITALS — BP 144/94 | HR 78 | Temp 97.8°F | Resp 20 | Ht 66.0 in | Wt 231.4 lb

## 2013-01-08 DIAGNOSIS — C50419 Malignant neoplasm of upper-outer quadrant of unspecified female breast: Secondary | ICD-10-CM

## 2013-01-08 DIAGNOSIS — Z171 Estrogen receptor negative status [ER-]: Secondary | ICD-10-CM

## 2013-01-08 LAB — CBC WITH DIFFERENTIAL/PLATELET
BASO%: 0.9 % (ref 0.0–2.0)
LYMPH%: 32.9 % (ref 14.0–49.7)
MCHC: 32.7 g/dL (ref 31.5–36.0)
MONO#: 0.9 10*3/uL (ref 0.1–0.9)
Platelets: 247 10*3/uL (ref 145–400)
RBC: 4.82 10*6/uL (ref 3.70–5.45)
WBC: 11.5 10*3/uL — ABNORMAL HIGH (ref 3.9–10.3)
lymph#: 3.8 10*3/uL — ABNORMAL HIGH (ref 0.9–3.3)

## 2013-01-08 LAB — COMPREHENSIVE METABOLIC PANEL (CC13)
ALT: 19 U/L (ref 0–55)
AST: 11 U/L (ref 5–34)
Alkaline Phosphatase: 115 U/L (ref 40–150)
CO2: 27 mEq/L (ref 22–29)
Sodium: 141 mEq/L (ref 136–145)
Total Bilirubin: 0.37 mg/dL (ref 0.20–1.20)
Total Protein: 8 g/dL (ref 6.4–8.3)

## 2013-01-08 MED ORDER — LIDOCAINE-PRILOCAINE 2.5-2.5 % EX CREA: TOPICAL_CREAM | CUTANEOUS | Status: DC | PRN

## 2013-01-08 MED ORDER — UNABLE TO FIND: Status: DC

## 2013-01-08 MED ORDER — PROCHLORPERAZINE MALEATE 10 MG PO TABS: 10.0000 mg | ORAL_TABLET | Freq: Four times a day (QID) | ORAL | Status: DC | PRN

## 2013-01-08 MED ORDER — ONDANSETRON HCL 8 MG PO TABS: 8.0000 mg | ORAL_TABLET | Freq: Two times a day (BID) | ORAL | Status: DC

## 2013-01-08 MED ORDER — LORAZEPAM 0.5 MG PO TABS: 0.5000 mg | ORAL_TABLET | Freq: Four times a day (QID) | ORAL | Status: DC | PRN

## 2013-01-08 MED ORDER — PROCHLORPERAZINE 25 MG RE SUPP: 25.0000 mg | Freq: Two times a day (BID) | RECTAL | Status: DC | PRN

## 2013-01-08 MED ORDER — DEXAMETHASONE 4 MG PO TABS: 8.0000 mg | ORAL_TABLET | Freq: Two times a day (BID) | ORAL | Status: DC

## 2013-01-08 NOTE — Telephone Encounter (Signed)
appts made and printed for pt,pt aware that i will call her with the appt times on 2/17 2/24 3/10 and will get chemos added as well    anne

## 2013-01-09 ENCOUNTER — Encounter (INDEPENDENT_AMBULATORY_CARE_PROVIDER_SITE_OTHER): Payer: Self-pay | Admitting: General Surgery

## 2013-01-13 ENCOUNTER — Telehealth: Payer: Self-pay | Admitting: Oncology

## 2013-01-13 NOTE — Telephone Encounter (Signed)
All appts are complete. S/w pt she is aware and will get new schedule when she comes in on 2/17 @ 9:15am. Per pt she has also viewed schedule via mychart.

## 2013-01-14 ENCOUNTER — Encounter (HOSPITAL_COMMUNITY): Payer: Self-pay | Admitting: Anesthesiology

## 2013-01-14 ENCOUNTER — Ambulatory Visit (HOSPITAL_COMMUNITY)
Admission: RE | Admit: 2013-01-14 | Discharge: 2013-01-14 | Disposition: A | Discharge status: Home / Self Care | Payer: BC Managed Care – PPO | Source: Ambulatory Visit | Attending: General Surgery | Admitting: General Surgery

## 2013-01-14 ENCOUNTER — Ambulatory Visit (HOSPITAL_COMMUNITY): Payer: BC Managed Care – PPO | Admitting: Anesthesiology

## 2013-01-14 ENCOUNTER — Encounter (HOSPITAL_COMMUNITY)
Admission: RE | Disposition: A | Discharge status: Home / Self Care | Payer: Self-pay | Source: Ambulatory Visit | Attending: General Surgery

## 2013-01-14 ENCOUNTER — Encounter (HOSPITAL_COMMUNITY): Payer: Self-pay | Admitting: *Deleted

## 2013-01-14 DIAGNOSIS — Z807 Family history of other malignant neoplasms of lymphoid, hematopoietic and related tissues: Secondary | ICD-10-CM | POA: Insufficient documentation

## 2013-01-14 DIAGNOSIS — K219 Gastro-esophageal reflux disease without esophagitis: Secondary | ICD-10-CM | POA: Insufficient documentation

## 2013-01-14 DIAGNOSIS — Z79899 Other long term (current) drug therapy: Secondary | ICD-10-CM | POA: Insufficient documentation

## 2013-01-14 DIAGNOSIS — F411 Generalized anxiety disorder: Secondary | ICD-10-CM | POA: Insufficient documentation

## 2013-01-14 DIAGNOSIS — Z800 Family history of malignant neoplasm of digestive organs: Secondary | ICD-10-CM | POA: Insufficient documentation

## 2013-01-14 DIAGNOSIS — Z801 Family history of malignant neoplasm of trachea, bronchus and lung: Secondary | ICD-10-CM | POA: Insufficient documentation

## 2013-01-14 DIAGNOSIS — C50919 Malignant neoplasm of unspecified site of unspecified female breast: Secondary | ICD-10-CM | POA: Insufficient documentation

## 2013-01-14 DIAGNOSIS — I10 Essential (primary) hypertension: Secondary | ICD-10-CM | POA: Insufficient documentation

## 2013-01-14 DIAGNOSIS — Z808 Family history of malignant neoplasm of other organs or systems: Secondary | ICD-10-CM | POA: Insufficient documentation

## 2013-01-14 DIAGNOSIS — E669 Obesity, unspecified: Secondary | ICD-10-CM | POA: Insufficient documentation

## 2013-01-14 HISTORY — PX: RE-EXCISION OF BREAST CANCER,SUPERIOR MARGINS: SHX6047

## 2013-01-14 SURGERY — RE-EXCISION OF BREAST CANCER,SUPERIOR MARGINS
Anesthesia: General | Site: Breast | Laterality: Right | Wound class: Clean

## 2013-01-14 MED ORDER — PROMETHAZINE HCL 25 MG/ML IJ SOLN
INTRAMUSCULAR | Status: AC
  Filled 2013-01-14: qty 1

## 2013-01-14 MED ORDER — CEFAZOLIN SODIUM-DEXTROSE 2-3 GM-% IV SOLR
2.0000 g | INTRAVENOUS | Status: AC
  Administered 2013-01-14: 2 g via INTRAVENOUS

## 2013-01-14 MED ORDER — PHENYLEPHRINE HCL 10 MG/ML IJ SOLN
INTRAMUSCULAR | Status: DC | PRN
  Administered 2013-01-14: 40 ug via INTRAVENOUS
  Administered 2013-01-14 (×2): 80 ug via INTRAVENOUS

## 2013-01-14 MED ORDER — SODIUM CHLORIDE 0.9 % IV SOLN: 250.0000 mL | INTRAVENOUS | Status: DC | PRN

## 2013-01-14 MED ORDER — FENTANYL CITRATE 0.05 MG/ML IJ SOLN
INTRAMUSCULAR | Status: DC | PRN
  Administered 2013-01-14: 150 ug via INTRAVENOUS
  Administered 2013-01-14: 100 ug via INTRAVENOUS

## 2013-01-14 MED ORDER — PROPOFOL 10 MG/ML IV BOLUS
INTRAVENOUS | Status: DC | PRN
  Administered 2013-01-14: 2 mg via INTRAVENOUS
  Administered 2013-01-14: 175 mg via INTRAVENOUS

## 2013-01-14 MED ORDER — ACETAMINOPHEN 325 MG PO TABS: 650.0000 mg | ORAL_TABLET | ORAL | Status: DC | PRN

## 2013-01-14 MED ORDER — LACTATED RINGERS IV SOLN
INTRAVENOUS | Status: DC
  Administered 2013-01-14: 15:00:00 via INTRAVENOUS
  Administered 2013-01-14: 1000 mL via INTRAVENOUS

## 2013-01-14 MED ORDER — ACETAMINOPHEN 10 MG/ML IV SOLN
INTRAVENOUS | Status: AC
  Filled 2013-01-14: qty 100

## 2013-01-14 MED ORDER — MIDAZOLAM HCL 5 MG/5ML IJ SOLN
INTRAMUSCULAR | Status: DC | PRN
  Administered 2013-01-14: 2 mg via INTRAVENOUS

## 2013-01-14 MED ORDER — ONDANSETRON HCL 4 MG/2ML IJ SOLN
INTRAMUSCULAR | Status: DC | PRN
  Administered 2013-01-14: 4 mg via INTRAVENOUS

## 2013-01-14 MED ORDER — ONDANSETRON HCL 4 MG/2ML IJ SOLN: 4.0000 mg | Freq: Four times a day (QID) | INTRAMUSCULAR | Status: DC | PRN

## 2013-01-14 MED ORDER — SODIUM CHLORIDE 0.9 % IJ SOLN: 3.0000 mL | INTRAMUSCULAR | Status: DC | PRN

## 2013-01-14 MED ORDER — BUPIVACAINE-EPINEPHRINE 0.25% -1:200000 IJ SOLN
INTRAMUSCULAR | Status: DC | PRN
  Administered 2013-01-14: 10 mL

## 2013-01-14 MED ORDER — CEFAZOLIN SODIUM-DEXTROSE 2-3 GM-% IV SOLR
INTRAVENOUS | Status: AC
  Filled 2013-01-14: qty 50

## 2013-01-14 MED ORDER — DEXAMETHASONE SODIUM PHOSPHATE 10 MG/ML IJ SOLN
INTRAMUSCULAR | Status: DC | PRN
  Administered 2013-01-14: 10 mg via INTRAVENOUS

## 2013-01-14 MED ORDER — SCOPOLAMINE 1 MG/3DAYS TD PT72
1.0000 | MEDICATED_PATCH | TRANSDERMAL | Status: DC
  Administered 2013-01-14: 1 via TRANSDERMAL
  Administered 2013-01-14: 1.5 mg via TRANSDERMAL
  Filled 2013-01-14 (×2): qty 1

## 2013-01-14 MED ORDER — PROMETHAZINE HCL 25 MG/ML IJ SOLN
6.2500 mg | INTRAMUSCULAR | Status: DC | PRN
  Administered 2013-01-14: 6.25 mg via INTRAVENOUS

## 2013-01-14 MED ORDER — LIDOCAINE HCL 1 % IJ SOLN
INTRAMUSCULAR | Status: DC | PRN
  Administered 2013-01-14: 80 mg via INTRADERMAL

## 2013-01-14 MED ORDER — SODIUM CHLORIDE 0.9 % IR SOLN
Status: DC | PRN
  Administered 2013-01-14: 1000 mL

## 2013-01-14 MED ORDER — ACETAMINOPHEN 650 MG RE SUPP
650.0000 mg | RECTAL | Status: DC | PRN
  Filled 2013-01-14: qty 1

## 2013-01-14 MED ORDER — FENTANYL CITRATE 0.05 MG/ML IJ SOLN: 25.0000 ug | INTRAMUSCULAR | Status: DC | PRN

## 2013-01-14 MED ORDER — SODIUM CHLORIDE 0.9 % IJ SOLN: 3.0000 mL | Freq: Two times a day (BID) | INTRAMUSCULAR | Status: DC

## 2013-01-14 MED ORDER — MUPIROCIN 2 % EX OINT
TOPICAL_OINTMENT | Freq: Two times a day (BID) | CUTANEOUS | Status: DC
  Administered 2013-01-14: 1 via NASAL
  Filled 2013-01-14: qty 22

## 2013-01-14 MED ORDER — KETOROLAC TROMETHAMINE 30 MG/ML IJ SOLN: 15.0000 mg | Freq: Once | INTRAMUSCULAR | Status: DC | PRN

## 2013-01-14 MED ORDER — HYDROCODONE-ACETAMINOPHEN 5-325 MG PO TABS
1.0000 | ORAL_TABLET | ORAL | Status: DC | PRN
  Administered 2013-01-14: 1 via ORAL
  Filled 2013-01-14: qty 1

## 2013-01-14 MED ORDER — LIDOCAINE HCL 1 % IJ SOLN
INTRAMUSCULAR | Status: AC
  Filled 2013-01-14: qty 20

## 2013-01-14 MED ORDER — LIDOCAINE HCL (PF) 1 % IJ SOLN
INTRAMUSCULAR | Status: DC | PRN
  Administered 2013-01-14: 10 mL

## 2013-01-14 MED ORDER — HYDROCODONE-ACETAMINOPHEN 5-325 MG PO TABS: 1.0000 | ORAL_TABLET | Freq: Four times a day (QID) | ORAL | Status: DC | PRN

## 2013-01-14 MED ORDER — METOCLOPRAMIDE HCL 5 MG/ML IJ SOLN
INTRAMUSCULAR | Status: DC | PRN
  Administered 2013-01-14: 10 mg via INTRAVENOUS

## 2013-01-14 SURGICAL SUPPLY — 48 items
ADH SKN CLS APL DERMABOND .7 (GAUZE/BANDAGES/DRESSINGS) ×1
BANDAGE ELASTIC 6 VELCRO ST LF (GAUZE/BANDAGES/DRESSINGS) IMPLANT
BANDAGE GAUZE ELAST BULKY 4 IN (GAUZE/BANDAGES/DRESSINGS) IMPLANT
BINDER BREAST LRG (GAUZE/BANDAGES/DRESSINGS) IMPLANT
BINDER BREAST XLRG (GAUZE/BANDAGES/DRESSINGS) ×1 IMPLANT
BLADE SURG 15 STRL LF DISP TIS (BLADE) IMPLANT
BLADE SURG 15 STRL SS (BLADE)
BLADE SURG SZ10 CARB STEEL (BLADE) ×2 IMPLANT
BNDG COHESIVE 4X5 TAN STRL (GAUZE/BANDAGES/DRESSINGS) IMPLANT
CANISTER SUCTION 2500CC (MISCELLANEOUS) ×2 IMPLANT
CLIP TI WIDE RED SMALL 6 (CLIP) ×1 IMPLANT
CLOTH BEACON ORANGE TIMEOUT ST (SAFETY) ×2 IMPLANT
CONT SPEC 4OZ CLIKSEAL STRL BL (MISCELLANEOUS) ×2 IMPLANT
DECANTER SPIKE VIAL GLASS SM (MISCELLANEOUS) ×2 IMPLANT
DERMABOND ADVANCED (GAUZE/BANDAGES/DRESSINGS) ×1
DERMABOND ADVANCED .7 DNX12 (GAUZE/BANDAGES/DRESSINGS) IMPLANT
DRAIN CHANNEL RND F F (WOUND CARE) IMPLANT
DRSG PAD ABDOMINAL 8X10 ST (GAUZE/BANDAGES/DRESSINGS) ×1 IMPLANT
ELECT CAUTERY BLADE 6.4 (BLADE) ×2 IMPLANT
ELECT REM PT RETURN 9FT ADLT (ELECTROSURGICAL) ×2
ELECTRODE REM PT RTRN 9FT ADLT (ELECTROSURGICAL) ×1 IMPLANT
EVACUATOR SILICONE 100CC (DRAIN) IMPLANT
GAUZE SPONGE 4X4 16PLY XRAY LF (GAUZE/BANDAGES/DRESSINGS) IMPLANT
GLOVE BIO SURGEON STRL SZ 6 (GLOVE) ×4 IMPLANT
GLOVE BIOGEL PI IND STRL 6.5 (GLOVE) ×1 IMPLANT
GLOVE BIOGEL PI IND STRL 7.0 (GLOVE) ×1 IMPLANT
GLOVE BIOGEL PI INDICATOR 6.5 (GLOVE) ×1
GLOVE BIOGEL PI INDICATOR 7.0 (GLOVE) ×1
GLOVE INDICATOR 6.5 STRL GRN (GLOVE) ×4 IMPLANT
GOWN PREVENTION PLUS XLARGE (GOWN DISPOSABLE) IMPLANT
GOWN PREVENTION PLUS XXLARGE (GOWN DISPOSABLE) ×2 IMPLANT
KIT BASIN OR (CUSTOM PROCEDURE TRAY) ×2 IMPLANT
MARKER SKIN DUAL TIP RULER LAB (MISCELLANEOUS) IMPLANT
NDL SPNL 22GX3.5 QUINCKE BK (NEEDLE) ×1 IMPLANT
NEEDLE HYPO 22GX1.5 SAFETY (NEEDLE) IMPLANT
NEEDLE SPNL 22GX3.5 QUINCKE BK (NEEDLE) ×2 IMPLANT
NS IRRIG 1000ML POUR BTL (IV SOLUTION) ×2 IMPLANT
PACK GENERAL/GYN (CUSTOM PROCEDURE TRAY) ×2 IMPLANT
PACK UNIVERSAL I (CUSTOM PROCEDURE TRAY) ×2 IMPLANT
SPONGE GAUZE 4X4 12PLY (GAUZE/BANDAGES/DRESSINGS) ×2 IMPLANT
SPONGE LAP 18X18 X RAY DECT (DISPOSABLE) ×2 IMPLANT
STAPLER VISISTAT 35W (STAPLE) ×2 IMPLANT
STOCKINETTE 8 INCH (MISCELLANEOUS) IMPLANT
STRIP CLOSURE SKIN 1/2X4 (GAUZE/BANDAGES/DRESSINGS) ×1 IMPLANT
SUT VIC AB 3-0 SH 27 (SUTURE) ×2
SUT VIC AB 3-0 SH 27XBRD (SUTURE) IMPLANT
SYR CONTROL 10ML LL (SYRINGE) ×2 IMPLANT
TOWEL OR 17X26 10 PK STRL BLUE (TOWEL DISPOSABLE) ×2 IMPLANT

## 2013-01-14 NOTE — H&P (View-Only) (Signed)
Chief complaint:  Right breast cancer.    HISTORY: Patient is a 51-year-old female who presents with a palpable right breast mass. This is seen on mammogram and ultrasound. On Wednesday it appeared a 1.6 cm and another was 2.5. She has been able to feel it. She has no family history of breast cancer but did have a father with stomach cancer at age 23, a maternal grandmother with skin cancer,  maternal cousin with Hodgkin's, and a paternal grandfather with lung cancer.  She has no personal history of cancer. 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 She has not ever been a smoker and does drink around 1-2 beers per week. She does not use any illicit drugs. Menarche was at 15. She is no longer having periods and ceased having periods in 2010. She has not ever had any hormone replacement therapy. She carried 3 children to term and did use hormone shots for contraception but does not recall the period of time. Other than colonoscopy her health screening is up to date.  Past Medical History  Diagnosis Date  . Breast cancer   . Hyperlipidemia   . Diverticulitis   . Hernia, hiatal   . Heartburn   . Anxiety   . Hot flashes     Past Surgical History  Procedure Date  . Cesarean section     x 3  . Tubal ligation   . Tonsillectomy and adenoidectomy     Current Outpatient Prescriptions  Medication Sig Dispense Refill  . ibuprofen (ADVIL,MOTRIN) 200 MG tablet Take 200 mg by mouth as needed.      . LORazepam (ATIVAN) 0.5 MG tablet Take 0.5 mg by mouth as needed.      . omeprazole (PRILOSEC) 20 MG capsule Take 20 mg by mouth daily.      . Probiotic Product (PROBIOTIC DAILY PO) Take 1 each by mouth.         Allergies  Allergen Reactions  . Codeine   . Medralone (Methylprednisolone Acetate)      Family History  Problem Relation Age of Onset  . Stomach cancer Father   . Lung cancer Maternal Uncle   . Skin cancer Maternal Grandmother   . Lung cancer Maternal Grandfather   . Lymphoma Cousin       History   Social History  . Marital Status: Single    Spouse Name: N/A    Number of Children: N/A  . Years of Education: N/A   Social History Main Topics  . Smoking status: Never Smoker   . Smokeless tobacco: Not on file  . Alcohol Use: 1.2 oz/week    2 Cans of beer per week  . Drug Use: No  . Sexually Active: Yes     REVIEW OF SYSTEMS - PERTINENT POSITIVES ONLY: 12 point review of systems negative other than HPI and PMH except for fatigue, night sweats, difficulty sleeping, palpitations, heartburn, anxiety  EXAM: Wt Readings from Last 3 Encounters:  12/17/12 234 lb 4.8 oz (106.278 kg)   Temp Readings from Last 3 Encounters:  12/17/12 98 F (36.7 C)    BP Readings from Last 3 Encounters:  12/17/12 150/102   Pulse Readings from Last 3 Encounters:  12/17/12 85    Gen:  No acute distress.  Well nourished and well groomed.   Neurological: Alert and oriented to person, place, and time. Coordination normal.  Head: Normocephalic and atraumatic.  Eyes: Conjunctivae are normal. Pupils are equal, round, and reactive to light. No scleral   icterus.  Neck: Normal range of motion. Neck supple. No tracheal deviation or thyromegaly present.  Cardiovascular: Normal rate, regular rhythm, normal heart sounds and intact distal pulses.  Exam reveals no gallop and no friction rub.  No murmur heard. Respiratory: Effort normal.  No respiratory distress. No chest wall tenderness. Breath sounds normal.  No wheezes, rales or rhonchi.  Breast:  Breasts are reasonably symmetric.  Right breast has 2 cm mass palpable at 11:30-12 oclock.  No LAD present.  No nipple retraction or skin dimpling is seen.   GI: Soft. Bowel sounds are normal. The abdomen is soft and nontender.  There is no rebound and no guarding.  Musculoskeletal: Normal range of motion. Extremities are nontender.  Lymphadenopathy: No cervical, preauricular, postauricular or axillary adenopathy is present Skin: Skin is warm and  dry. No rash noted. No diaphoresis. No erythema. No pallor. No clubbing, cyanosis, or edema.   Psychiatric: Normal mood and affect. Behavior is normal. Judgment and thought content normal.    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 - SEE COMMENT.  RADIOLOGY RESULTS: See E-Chart or I-Site for most recent results.  Images and reports are reviewed. Mammo/ultrasound IMPRESSION:  1.6 cm irregular mass located within the right breast at the 11:30  o'clock position 7 cm from the nipple corresponding to the  mammographic finding. Tissue sampling is recommended. I have  discussed ultrasound-guided core biopsy. The patient would like to  proceed with this. This will be performed and reported separately.  RECOMMENDATION:  Right breast ultrasound guided core biopsy.  I have discussed the findings and recommendations with the patient.  Results were also provided in writing at the conclusion of the  visit.  BI-RADS CATEGORY 5: Highly suggestive of malignancy - appropriate  action should be taken.     ASSESSMENT AND PLAN: Cancer of upper-outer quadrant of female breast Patient has clinical T1 N0 right breast cancer with a palpable mass at 12:00.  Based on the size discrepancy on mammogram versus ultrasound, she is requesting an MRI. She has hormone receptor negative disease with positive Herceptin and relatively high Ki-67.  I will tentatively posterior for a lumpectomy without needle localization, sentinel lymph node biopsy, and Port-A-Cath placement.   The surgery and incisions were discussed with the patient as well as the risks of surgery. I discussed bleeding, infection, damage to adjacent structures, pneumothorax, need for additional surgeries, and possible cancer recurrence.  The patient understands and wishes to proceed. Should her MRI shows something that would necessitate a change in her  surgical plan we will discuss this. If she does have a much larger tumor on MRI, we will proceed with neoadjuvant chemotherapy and only do port placement.  45 minutes were spent in counseling, evaluation, examination, and coordination of care.       Kaaren Nass L Jovan Schickling MD Surgical Oncology, General and Endocrine Surgery [facility]      Visit Diagnoses: 1. Cancer of upper-outer quadrant of female breast     Primary Care Physician: DOOLITTLE, ROBERT P, MD  Khan, Kalsoom, MD Wentworth, Stacey Holland  

## 2013-01-14 NOTE — Interval H&P Note (Signed)
History and Physical Interval Note:  01/14/2013 1:57 PM  Julia Zuniga  has presented today for surgery, with the diagnosis of RIGHT BREAST CANCER   The various methods of treatment have been discussed with the patient and family.  Pt underwent lumpectomy and had two close margins.  We plan to reexcise.   After consideration of risks, benefits and other options for treatment, the patient has consented to  Procedure(s) (LRB) with comments: RE-EXCISION OF BREAST CANCER MARGINS (Right) - right breast re excision for markers  as a surgical intervention .  The patient's history has been reviewed, patient examined, no change in status, stable for surgery.  I have reviewed the patient's chart and labs.  Questions were answered to the patient's satisfaction.     Ranae Casebier

## 2013-01-14 NOTE — Anesthesia Procedure Notes (Signed)
Procedure Name: LMA Insertion Date/Time: 01/14/2013 2:21 PM Performed by: Hulan Fess Pre-anesthesia Checklist: Patient identified, Emergency Drugs available, Suction available, Patient being monitored and Timeout performed Patient Re-evaluated:Patient Re-evaluated prior to inductionOxygen Delivery Method: Circle system utilized Preoxygenation: Pre-oxygenation with 100% oxygen Intubation Type: IV induction Ventilation: Mask ventilation without difficulty LMA: LMA inserted and LMA with gastric port inserted LMA Size: 4.0

## 2013-01-14 NOTE — Progress Notes (Signed)
Pt request scopolime patch/dr rose notified/see new order.  Also has small crusty lesion on lt ear, note placed on chartfront for md to check

## 2013-01-14 NOTE — Op Note (Signed)
Re-excision of right breast cancer margins (lumpectomy  Indications: This patient presents with history of right breast cancer  Pre-operative Diagnosis: right breast cancer  Post-operative Diagnosis: right breast cancer  Surgeon: Almond Lint   Anesthesia: General LMA anesthesia and Local anesthesia 1% buffered lidocaine, 0.25.% bupivacaine, with epinephrine  ASA Class: 2  Procedure Details  The patient was seen in the Holding Room. The risks, benefits, complications, treatment options, and expected outcomes were discussed with the patient. The possibilities of reaction to medication, pulmonary aspiration, bleeding, infection, the need for additional procedures, failure to diagnose a condition, and creating a complication requiring transfusion or operation were discussed with the patient. The patient concurred with the proposed plan, giving informed consent.  The site of surgery properly noted/marked. The patient was taken to Operating Room # 11, identified, and the procedure verified as Breast Excisional Biopsy. A Time Out was held and the above information confirmed.  After induction of anesthesia, the right  breast and chest were prepped and draped in standard fashion. The lumpectomy was performed by creating an oblique incision over the upper breast of the breast.  Dissection was carried down to the pectoralis fascia inferiorly.  The upper two thirds of the previous specimen was on pectoralis fascia, but the lower portion was not.  The lower posterior border was taken.  Orientation sutures were placed.  Hemostasis was achieved with cautery.  Tissue was taken along the anterior, inferior and posterior margin and submitted separately to pathology after providing orientation for the pathology.  The wound was irrigated and closed with a 3-0 Vicryl interrupted deep dermal stitch and a 4-0 Monocryl subcuticular closure in layers.    Sterile dressings were applied. At the end of the operation, all  sponge, instrument, and needle counts were correct.  Findings: grossly clear surgical margins  Estimated Blood Loss:  Minimal          Specimens:  Anterior margin Inferior margin Posterior margins         Complications:  None; patient tolerated the procedure well.         Disposition: PACU - hemodynamically stable.         Condition: stable

## 2013-01-14 NOTE — Transfer of Care (Signed)
Immediate Anesthesia Transfer of Care Note  Patient: Julia Zuniga  Procedure(s) Performed: Procedure(s) (LRB) with comments: RE-EXCISION OF BREAST CANCER,SUPERIOR MARGINS (Right) - right breast re excision for markers   Patient Location: PACU  Anesthesia Type:General  Level of Consciousness: awake, alert , oriented, pateint uncooperative and responds to stimulation  Airway & Oxygen Therapy: Patient Spontanous Breathing and Patient connected to face mask oxygen  Post-op Assessment: Report given to PACU RN, Post -op Vital signs reviewed and stable and Patient moving all extremities X 4  Post vital signs: Reviewed and stable  Complications: No apparent anesthesia complications

## 2013-01-14 NOTE — Anesthesia Postprocedure Evaluation (Signed)
  Anesthesia Post-op Note  Patient: Julia Zuniga  Procedure(s) Performed: Procedure(s) (LRB): RE-EXCISION OF BREAST CANCER,SUPERIOR MARGINS (Right)  Patient Location: PACU  Anesthesia Type: General  Level of Consciousness: awake and alert   Airway and Oxygen Therapy: Patient Spontanous Breathing  Post-op Pain: mild  Post-op Assessment: Post-op Vital signs reviewed, Patient's Cardiovascular Status Stable, Respiratory Function Stable, Patent Airway and No signs of Nausea or vomiting  Last Vitals:  Filed Vitals:   01/14/13 1600  BP: 106/59  Pulse: 57  Temp:   Resp: 12    Post-op Vital Signs: stable   Complications: No apparent anesthesia complications

## 2013-01-14 NOTE — Anesthesia Preprocedure Evaluation (Addendum)
Anesthesia Evaluation  Patient identified by MRN, date of birth, ID band Patient awake    Reviewed: Allergy & Precautions, H&P , NPO status , Patient's Chart, lab work & pertinent test results  History of Anesthesia Complications (+) PONV  Airway Mallampati: III TM Distance: <3 FB Neck ROM: Full    Dental No notable dental hx.    Pulmonary neg pulmonary ROS,  breath sounds clear to auscultation  Pulmonary exam normal       Cardiovascular hypertension, Pt. on medications Rhythm:Regular Rate:Normal     Neuro/Psych Anxiety negative neurological ROS     GI/Hepatic Neg liver ROS, GERD-  Medicated,  Endo/Other  Morbid obesity  Renal/GU negative Renal ROS  negative genitourinary   Musculoskeletal negative musculoskeletal ROS (+)   Abdominal   Peds negative pediatric ROS (+)  Hematology negative hematology ROS (+)   Anesthesia Other Findings   Reproductive/Obstetrics negative OB ROS                           Anesthesia Physical Anesthesia Plan  ASA: III  Anesthesia Plan: General   Post-op Pain Management:    Induction: Intravenous  Airway Management Planned: LMA  Additional Equipment:   Intra-op Plan:   Post-operative Plan:   Informed Consent: I have reviewed the patients History and Physical, chart, labs and discussed the procedure including the risks, benefits and alternatives for the proposed anesthesia with the patient or authorized representative who has indicated his/her understanding and acceptance.   Dental advisory given  Plan Discussed with: CRNA and Surgeon  Anesthesia Plan Comments:         Anesthesia Quick Evaluation

## 2013-01-15 ENCOUNTER — Ambulatory Visit (HOSPITAL_COMMUNITY): Payer: BC Managed Care – PPO

## 2013-01-15 ENCOUNTER — Encounter (HOSPITAL_COMMUNITY): Payer: Self-pay | Admitting: General Surgery

## 2013-01-17 ENCOUNTER — Encounter: Payer: Self-pay | Admitting: *Deleted

## 2013-01-17 NOTE — Progress Notes (Signed)
Mailed after appt letter to pt. 

## 2013-01-18 NOTE — Progress Notes (Signed)
OFFICE PROGRESS NOTE  CC**  DOOLITTLE, Harrel Lemon, MD [address] [place] [address] Dr. Almond Lint Dr. Lurline Hare  DIAGNOSIS: 52 year old female with invasive ductal carcinoma of the right breast diagnosed December 2013.  PRIOR THERAPY:  #1 patient was originally seen in the multidisciplinary breast clinic on 12/17/2012 with a screening mammogram that showed an abnormality in the right breast. This abnormality measured 1.6 cm. She had a biopsy performed that showed invasive ductal carcinoma grade 2, ER negative, PR negative, HER-2 positive with a Ki-67 of 80%.  #2 patient is now status post right lumpectomy on 12/31/2012 with the pathology revealing a 8 cm invasive ductal carcinoma grade 3 with lymphovascular invasion, ER negative PR negative HER-2/neu positive with amplification of 4.36, 2 sentinel nodes were negative for metastatic disease. Patient did have positive margins and she will need a reexcision which will be performed on 01/14/2013. Patient has a Port-A-Cath in place.  #3 patient will proceed with adjuvant chemotherapy initially consisting of Taxotere carboplatinum and Herceptin. With Taxotere carboplatinum to be given every 21 days and Herceptin on a weekly basis for a total of 6 cycles of TC.we will plan on starting her first cycle of chemotherapy on 01/26/2013.  CURRENT THERAPY:proceed with TCH on 01/26/2013  INTERVAL HISTORY: ALEDA MADL 52 y.o. female returns for followup visit after her surgery. We extensively discussed her final pathology. She does understand that she will need adjuvant chemotherapy and Herceptin. Overall she has recovered from her surgery quite nicely. But she knows that she does have to go for a reexcision. She denies any nausea vomiting fevers chills night sweats headaches shortness of breath chest pains palpitations she has no myalgias and arthralgias.remainder of the 10 point review of systems is negative.  MEDICAL HISTORY: Past  Medical History  Diagnosis Date  . Breast cancer   . Hyperlipidemia   . Diverticulitis   . Hernia, hiatal   . Heartburn   . Anxiety   . Hot flashes   . PONV (postoperative nausea and vomiting)     ALLERGIES:  is allergic to codeine; medralone; and oxycodone-acetaminophen.  MEDICATIONS:  Current Outpatient Prescriptions  Medication Sig Dispense Refill  . carvedilol (COREG) 6.25 MG tablet Take 1 tablet (6.25 mg total) by mouth 2 (two) times daily with a meal.  60 tablet  6  . omeprazole (PRILOSEC) 20 MG capsule Take 20 mg by mouth daily.      . Probiotic Product (PROBIOTIC DAILY PO) Take 1 tablet by mouth daily.       Marland Kitchen atorvastatin (LIPITOR) 40 MG tablet Take 40 mg by mouth every evening.      Marland Kitchen dexamethasone (DECADRON) 4 MG tablet Take 2 tablets (8 mg total) by mouth 2 (two) times daily with a meal. Take two times a day the day before Taxotere. Then take two times a day starting the day after chemo for 3 days.  30 tablet  1  . HYDROcodone-acetaminophen (NORCO) 5-325 MG per tablet Take 1-2 tablets by mouth every 6 (six) hours as needed for pain.  15 tablet  1  . ibuprofen (ADVIL,MOTRIN) 200 MG tablet Take 400 mg by mouth every 6 (six) hours as needed. Pain      . lidocaine-prilocaine (EMLA) cream Apply topically as needed.  30 g  8  . LORazepam (ATIVAN) 0.5 MG tablet Take 1 tablet (0.5 mg total) by mouth every 6 (six) hours as needed (Nausea or vomiting).  30 tablet  0  . ondansetron (ZOFRAN) 8  MG tablet Take 1 tablet (8 mg total) by mouth 2 (two) times daily. Take two times a day starting the day after chemo for 3 days. Then take two times a day as needed for nausea or vomiting.  30 tablet  1  . prochlorperazine (COMPAZINE) 10 MG tablet Take 1 tablet (10 mg total) by mouth every 6 (six) hours as needed (Nausea or vomiting).  30 tablet  1  . prochlorperazine (COMPAZINE) 25 MG suppository Place 1 suppository (25 mg total) rectally every 12 (twelve) hours as needed for nausea.  12  suppository  3  . UNABLE TO FIND Cranial prosthesis due to chemotherapy induced hair loss.  1 Units  0   No current facility-administered medications for this visit.    SURGICAL HISTORY:  Past Surgical History  Procedure Laterality Date  . Cesarean section      x 3  . Tubal ligation    . Tonsillectomy and adenoidectomy    . Cholecystectomy  2001    lap choli  . Breast lumpectomy with sentinel lymph node biopsy  12/31/2012    Procedure: BREAST LUMPECTOMY WITH SENTINEL LYMPH NODE BX;  Surgeon: Almond Lint, MD;  Location: [facility];  Service: General;  Laterality: Right;  . Portacath placement  12/31/2012    Procedure: INSERTION PORT-A-CATH;  Surgeon: Almond Lint, MD;  Location: [facility];  Service: General;  Laterality: Left;  Left Subclavian Vein  . Re-excision of breast cancer,superior margins  01/14/2013    Procedure: RE-EXCISION OF BREAST CANCER,SUPERIOR MARGINS;  Surgeon: Almond Lint, MD;  Location: [facility];  Service: General;  Laterality: Right;  right breast re excision for markers     REVIEW OF SYSTEMS:  Pertinent items are noted in HPI.   HEALTH MAINTENANCE:  PHYSICAL EXAMINATION: Blood pressure 144/94, pulse 78, temperature 97.8 F (36.6 C), temperature source Oral, resp. rate 20, height 5\' 6"  (1.676 m), weight 231 lb 6.4 oz (104.962 kg), last menstrual period 01/07/2009. Body mass index is 37.37 kg/(m^2). ECOG PERFORMANCE STATUS: 0 - Asymptomatic   General appearance: alert, cooperative and appears stated age Lymph nodes: Cervical, supraclavicular, and axillary nodes normal. Resp: clear to auscultation bilaterally Back: symmetric, no curvature. ROM normal. No CVA tenderness. Cardio: regular rate and rhythm GI: soft, non-tender; bowel sounds normal; no masses,  no organomegaly Extremities: extremities normal, atraumatic, no cyanosis or edema Neurologic: Grossly normal Right breast reveals a healing lumpectomy scar there is no nodularity.  Left breast no masses or nipple discharge.  LABORATORY DATA: Lab Results  Component Value Date   WBC 11.5* 01/08/2013   HGB 12.3 01/08/2013   HCT 37.5 01/08/2013   MCV 77.8* 01/08/2013   PLT 247 01/08/2013      Chemistry      Component Value Date/Time   NA 141 01/08/2013 1112   NA 142 01/24/2011 2235   K 4.4 01/08/2013 1112   K 3.9 01/24/2011 2235   CL 104 01/08/2013 1112   CL 106 01/24/2011 2235   CO2 27 01/08/2013 1112   CO2 27 01/24/2011 2235   BUN 10.5 01/08/2013 1112   BUN 7 01/24/2011 2235   CREATININE 0.7 01/08/2013 1112   CREATININE 0.56 01/24/2011 2235      Component Value Date/Time   CALCIUM 9.9 01/08/2013 1112   CALCIUM 10.0 01/24/2011 2235   ALKPHOS 115 01/08/2013 1112   AST 11 01/08/2013 1112   ALT 19 01/08/2013 1112   BILITOT 0.37 01/08/2013 1112    ADDITIONAL INFORMATION: 1.  PROGNOSTIC INDICATORS - ACIS Results IMMUNOHISTOCHEMICAL AND MORPHOMETRIC ANALYSIS BY THE AUTOMATED CELLULAR IMAGING SYSTEM (ACIS) Estrogen Receptor (Negative, <1%): 0%, NEGATIVE Progesterone Receptor (Negative, <1%): 0%, NEGATIVE COMMENT: The negative hormone receptor study(ies) in this case have an internal positive control. All controls stained appropriately Pecola Leisure MD Pathologist, Electronic Signature ( Signed 01/07/2013) 1. CHROMOGENIC IN-SITU HYBRIDIZATION Interpretation: HER2/NEU BY CISH - SHOWS AMPLIFICATION BY CISH ANALYSIS. THE RATIO OF HER2: CEP 17 SIGNALS WAS 4.36. Reference range: Ratio: HER2:CEP17 < 1.8 gene amplification not observed Ratio: HER2:CEP 17 1.8-2.2 - equivocal result Ratio: HER2:CEP17 > 2.2 - gene amplification observed 1 of 4 FINAL for LYNDSIE, WALLMAN ([id number]) ADDITIONAL INFORMATION:(continued) Pecola Leisure MD Pathologist, Electronic Signature ( Signed 01/07/2013) FINAL DIAGNOSIS Diagnosis 1. Breast, lumpectomy, Right - INVASIVE DUCTAL CARCINOMA, GRADE III (1.8 CM), SEE COMMENT. - LYMPHOVASCULAR INVASION IDENTIFIED. - INVASIVE TUMOR IS 1 MM TO NEAREST  MARGIN (POSTERIOR). - DUCTAL CARCINOMA IN SITU, GRADE III WITH COMEDO NECROSIS. - IN SITU CARCINOMA IS LESS THAN 0.1 MM FROM ANTERIOR-INFERIOR AND POSTERIOR MARGINS. - SEE TUMOR TEMPLATE BELOW. 2. Lymph node, sentinel, biopsy, Right axillary - ONE LYMPH NODE, NEGATIVE FOR TUMOR (0/1), SEE COMMENT. 3. Lymph node, sentinel, biopsy, Right axillary - ONE LYMPH NODE, NEGATIVE FOR TUMOR (0/1), SEE COMMENT. Microscopic Comment 1. BREAST, INVASIVE TUMOR, WITH LYMPH NODE SAMPLING Specimen, including laterality: Right breast Procedure: Lumpectomy Grade: III of III Tubule formation: II Nuclear pleomorphism: III Mitotic:III Tumor size (gross measurement): 1.8 cm Margins: Invasive, distance to closest margin: 0.6 cm In-situ, distance to closest margin: less than 0.1 cm If margin positive, focally or broadly: N/A Lymphovascular invasion: Present Ductal carcinoma in situ: Present Grade: III of III Extensive intraductal component: Absent Lobular neoplasia: Absent Tumor focality: Unifocal Treatment effect: None If present, treatment effect in breast tissue, lymph nodes or both: N/A Extent of tumor: Skin: N/A Nipple: N/A Skeletal muscle: N/A Lymph nodes: # examined: 2 Lymph nodes with metastasis: 0 Breast prognostic profile: Estrogen receptor: Repeated, previous study demonstrated 0% positivity ([id number]) 2 of 4 FINAL for AVANTHIKA, DEHNERT ([id number]) Microscopic Comment(continued) Progesterone receptor: Repeated, previous study demonstrated 0% positivity ([id number]) Her 2 neu: Repeated, previous study demonstrated amplification (3.88) ([id number]) Ki-67: Not repeated, previous study demonstrated 83% proliferation rate ([id number]) Non-neoplastic breast: Previous biopsy site, fibrocystic change and microcalcifications TNM: pT1c pN0 pMX   RADIOGRAPHIC STUDIES:  Mr Breast Bilateral W Wo Contrast  12/23/2012  *RADIOLOGY REPORT*  Clinical Data: recent diagnosis of invasive ductal  carcinoma 12 o'clock right breast, for treatment planning  BILATERAL BREAST MRI WITH AND WITHOUT CONTRAST  Technique: Multiplanar, multisequence MR images of both breasts were obtained prior to and following the intravenous administration of 20ml of multihance.  Three dimensional images were evaluated at the independent DynaCad workstation.  Comparison:  mammographic and ultrasound images 12/08/12 and 11/20/12  Findings: There is mild background parenchymal enhancement.  The left breast is negative. There is no evidence of axillary or internal mammary adenopathy.  There is no abnormal skin or nipple enhancement.  On the right, associated with the recently placed biopsy marker clip, there is an enhancing spiculated mass in the 12 o'clock position 11cm from the nipple.  The mass measures 23 x 12mm transverse dimension by 35mm craniocaudal dimension.  1cm anterior and slightly inferior to the mass is a second 7mm mass which is connected to the dominant mass by a thin bridge of enhancement. There are no other abnormalities in the right breast.  IMPRESSION: Biopsy-proven invasive carcinoma in the 12 o'clock  position of the right breast, with an additional satellite focus as described above.  BI-RADS CATEGORY 6:  Known biopsy-proven malignancy - appropriate action should be taken.  THREE-DIMENSIONAL MR IMAGE RENDERING ON INDEPENDENT WORKSTATION:  Three-dimensional MR images were rendered by post-processing of the original MR data on an independent workstation.  The three- dimensional MR images were interpreted, and findings were reported in the accompanying complete MRI report for this study.   Original Report Authenticated By: Esperanza Heir, M.D.    Nm Sentinel Node Inj-no Rpt (breast)  12/31/2012  CLINICAL DATA: right breast cancer   Sulfur colloid was injected intradermally by the nuclear medicine  technologist for breast cancer sentinel node localization.     Dg Chest Port 1 View  12/31/2012  *RADIOLOGY REPORT*   Clinical Data: Port-A-Cath placement.  History of breast cancer.  PORTABLE CHEST - 1 VIEW  Comparison: 03/26/2011.  Findings: Central line has been placed from the left.  The tip is at the level of the distal superior vena cava/caval atrial junction.  No gross pneumothorax.  Consolidation medial aspect of the lung bases may be present possibly representing atelectasis or infiltrate.  Follow-up two- view chest with better inspiration recommended for further delineation.  Heart size top normal.  Mild central pulmonary vascular prominence.  Surgical clips project over the right lower chest and may be related to breast reconstruction/axillary lymph node dissection.  IMPRESSION: Central line has been placed from the left.  The tip is at the level of the distal superior vena cava/caval atrial junction.  No gross pneumothorax.  Consolidation medial aspect of the lung bases may be present possibly representing atelectasis or infiltrate.  Follow-up two- view chest with better inspiration recommended for further delineation.   Original Report Authenticated By: Lacy Duverney, M.D.    Dg Fluoro Guide Cv Line-no Report  12/31/2012  CLINICAL DATA: portacath   FLOURO GUIDE CV LINE  Fluoroscopy was utilized by the requesting physician.  No radiographic  interpretation.      ASSESSMENT: 52 year old female with  #1 new diagnosis of invasive ductal carcinoma of the right breast she is now status post lumpectomy with sentinel lymph node biopsy with the final pathology revealing a 1.8 cm invasive ductal carcinoma grade 3 ER negative PR negative HER-2/neu amplified at 4.36. 2 sentinel nodes were negative for metastatic disease. There was noted to be lymphovascular invasion. Postoperatively patient is doing well. She will go for a reexcision for positive margin.  #2 she will need adjuvant chemotherapy consisting of Taxotere carboplatinum given every 21 days for a total of 6 cycles along with Herceptin initially given on weekly  basis for duration of her chemotherapy. Once patient starts radiation therapy her Herceptin will begin every 3 weeks to finish out one year of therapy.  #3 patient has had chemotherapy teaching class all of her anti-emetics are sent to her pharmacy and she is also going to be seeing in cardiology in the CHF clinic for prevention of Herceptin related cardiac disease.  PLAN:   #1 patient will proceed with reexcision.  #2 she will return on 11/25/2013 for cycle one of her chemotherapy of TCH.   All questions were answered. The patient knows to call the clinic with any problems, questions or concerns. We can certainly see the patient much sooner if necessary.  I spent 40 minutes counseling the patient face to face. The total time spent in the appointment was 40 minutes.    Drue Second, MD Medical/Oncology [facility] [phone]  beeper) [phone] (Office)

## 2013-01-19 ENCOUNTER — Telehealth (INDEPENDENT_AMBULATORY_CARE_PROVIDER_SITE_OTHER): Payer: Self-pay

## 2013-01-19 NOTE — Telephone Encounter (Signed)
[name] pt to call.  All final margins are clear on her pathology.  Remind her of 01/23/13 appointment with Dr. Donell Beers.

## 2013-01-19 NOTE — Telephone Encounter (Signed)
Pt returned call and was given pathology results. 

## 2013-01-19 NOTE — Progress Notes (Signed)
Quick Note:  Please let pt know all final margins are negative. ______

## 2013-01-23 ENCOUNTER — Encounter (INDEPENDENT_AMBULATORY_CARE_PROVIDER_SITE_OTHER): Payer: Self-pay | Admitting: General Surgery

## 2013-01-24 ENCOUNTER — Other Ambulatory Visit: Payer: Self-pay

## 2013-01-26 ENCOUNTER — Ambulatory Visit (HOSPITAL_BASED_OUTPATIENT_CLINIC_OR_DEPARTMENT_OTHER): Payer: BC Managed Care – PPO

## 2013-01-26 ENCOUNTER — Ambulatory Visit (HOSPITAL_BASED_OUTPATIENT_CLINIC_OR_DEPARTMENT_OTHER): Payer: BC Managed Care – PPO | Admitting: Adult Health

## 2013-01-26 ENCOUNTER — Other Ambulatory Visit (HOSPITAL_BASED_OUTPATIENT_CLINIC_OR_DEPARTMENT_OTHER): Payer: BC Managed Care – PPO | Admitting: Lab

## 2013-01-26 ENCOUNTER — Encounter: Payer: Self-pay | Admitting: Adult Health

## 2013-01-26 VITALS — BP 130/84 | HR 72 | Temp 97.5°F | Resp 20

## 2013-01-26 VITALS — BP 147/93 | HR 93 | Temp 98.4°F | Resp 20 | Ht 66.0 in | Wt 231.8 lb

## 2013-01-26 DIAGNOSIS — C50419 Malignant neoplasm of upper-outer quadrant of unspecified female breast: Secondary | ICD-10-CM

## 2013-01-26 DIAGNOSIS — Z171 Estrogen receptor negative status [ER-]: Secondary | ICD-10-CM

## 2013-01-26 DIAGNOSIS — C50919 Malignant neoplasm of unspecified site of unspecified female breast: Secondary | ICD-10-CM

## 2013-01-26 DIAGNOSIS — Z5112 Encounter for antineoplastic immunotherapy: Secondary | ICD-10-CM

## 2013-01-26 LAB — CBC WITH DIFFERENTIAL/PLATELET
BASO%: 0.1 % (ref 0.0–2.0)
Basophils Absolute: 0 10*3/uL (ref 0.0–0.1)
EOS%: 0.1 % (ref 0.0–7.0)
MCH: 25.7 pg (ref 25.1–34.0)
MCHC: 32.6 g/dL (ref 31.5–36.0)
MCV: 78.9 fL — ABNORMAL LOW (ref 79.5–101.0)
MONO%: 2.3 % (ref 0.0–14.0)
RBC: 4.7 10*6/uL (ref 3.70–5.45)
RDW: 13.3 % (ref 11.2–14.5)
nRBC: 0 % (ref 0–0)

## 2013-01-26 LAB — COMPREHENSIVE METABOLIC PANEL (CC13)
AST: 10 U/L (ref 5–34)
Alkaline Phosphatase: 109 U/L (ref 40–150)
Glucose: 152 mg/dl — ABNORMAL HIGH (ref 70–99)
Sodium: 140 mEq/L (ref 136–145)
Total Bilirubin: 0.38 mg/dL (ref 0.20–1.20)
Total Protein: 8.1 g/dL (ref 6.4–8.3)

## 2013-01-26 MED ORDER — DOCETAXEL CHEMO INJECTION 160 MG/16ML
75.0000 mg/m2 | Freq: Once | INTRAVENOUS | Status: AC
  Administered 2013-01-26: 170 mg via INTRAVENOUS
  Filled 2013-01-26: qty 17

## 2013-01-26 MED ORDER — SODIUM CHLORIDE 0.9 % IV SOLN: Freq: Once | INTRAVENOUS | Status: DC

## 2013-01-26 MED ORDER — DIPHENHYDRAMINE HCL 25 MG PO CAPS
50.0000 mg | ORAL_CAPSULE | Freq: Once | ORAL | Status: AC
  Administered 2013-01-26: 50 mg via ORAL

## 2013-01-26 MED ORDER — HEPARIN SOD (PORK) LOCK FLUSH 100 UNIT/ML IV SOLN
500.0000 [IU] | Freq: Once | INTRAVENOUS | Status: AC | PRN
  Administered 2013-01-26: 500 [IU]
  Filled 2013-01-26: qty 5

## 2013-01-26 MED ORDER — SODIUM CHLORIDE 0.9 % IJ SOLN
10.0000 mL | INTRAMUSCULAR | Status: DC | PRN
  Administered 2013-01-26: 10 mL
  Filled 2013-01-26: qty 10

## 2013-01-26 MED ORDER — ACETAMINOPHEN 325 MG PO TABS
650.0000 mg | ORAL_TABLET | Freq: Once | ORAL | Status: AC
  Administered 2013-01-26: 650 mg via ORAL

## 2013-01-26 MED ORDER — TRASTUZUMAB CHEMO INJECTION 440 MG
4.0000 mg/kg | Freq: Once | INTRAVENOUS | Status: AC
  Administered 2013-01-26: 420 mg via INTRAVENOUS
  Filled 2013-01-26: qty 20

## 2013-01-26 MED ORDER — SODIUM CHLORIDE 0.9 % IV SOLN
Freq: Once | INTRAVENOUS | Status: AC
  Administered 2013-01-26: 12:00:00 via INTRAVENOUS

## 2013-01-26 MED ORDER — ONDANSETRON 16 MG/50ML IVPB (CHCC)
16.0000 mg | Freq: Once | INTRAVENOUS | Status: AC
  Administered 2013-01-26: 16 mg via INTRAVENOUS

## 2013-01-26 MED ORDER — DEXAMETHASONE SODIUM PHOSPHATE 4 MG/ML IJ SOLN
20.0000 mg | Freq: Once | INTRAMUSCULAR | Status: AC
  Administered 2013-01-26: 20 mg via INTRAVENOUS

## 2013-01-26 MED ORDER — SODIUM CHLORIDE 0.9 % IV SOLN
900.0000 mg | Freq: Once | INTRAVENOUS | Status: AC
  Administered 2013-01-26: 900 mg via INTRAVENOUS
  Filled 2013-01-26: qty 90

## 2013-01-26 NOTE — Patient Instructions (Addendum)
Doing well.  Proceed with chemotherapy.  Please call us if you have any questions or concerns.   [phone]  We will see you next week for your Herceptin.

## 2013-01-26 NOTE — Patient Instructions (Addendum)
[facility] Discharge Instructions for Patients Receiving Chemotherapy  Today you received the following chemotherapy agents Taxotere/Carboplatin/Herceptin  To help prevent nausea and vomiting after your treatment, we encourage you to take your nausea medication Zofran Begin taking it at 8:00pm and take it as often as prescribed for the next 24-36 hours.   If you develop nausea and vomiting that is not controlled by your nausea medication, call the clinic. If it is after clinic hours your family physician or the after hours number for the clinic or go to the Emergency Department.   BELOW ARE SYMPTOMS THAT SHOULD BE REPORTED IMMEDIATELY:  *FEVER GREATER THAN 100.5 F  *CHILLS WITH OR WITHOUT FEVER  NAUSEA AND VOMITING THAT IS NOT CONTROLLED WITH YOUR NAUSEA MEDICATION  *UNUSUAL SHORTNESS OF BREATH  *UNUSUAL BRUISING OR BLEEDING  TENDERNESS IN MOUTH AND THROAT WITH OR WITHOUT PRESENCE OF ULCERS  *URINARY PROBLEMS  *BOWEL PROBLEMS  UNUSUAL RASH Items with * indicate a potential emergency and should be followed up as soon as possible.  One of the nurses will contact you 24 hours after your treatment. Please let the nurse know about any problems that you may have experienced. Feel free to call the clinic you have any questions or concerns. The clinic phone number is [phone].   I have been informed and understand all the instructions given to me. I know to contact the clinic, my physician, or go to the Emergency Department if any problems should occur. I do not have any questions at this time, but understand that I may call the clinic during office hours   should I have any questions or need assistance in obtaining follow up care.    __________________________________________  _____________  __________ Signature of Patient or Authorized Representative            Date                   Time    __________________________________________ Nurse's  Signature   Ondansetron tablets (Zofran) What is this medicine? ONDANSETRON (on DAN se tron) is used to treat nausea and vomiting caused by chemotherapy. It is also used to prevent or treat nausea and vomiting after surgery. This medicine may be used for other purposes; ask your health care provider or pharmacist if you have questions. What should I tell my health care provider before I take this medicine? They need to know if you have any of these conditions: -heart disease -history of irregular heartbeat -liver disease -low levels of magnesium or potassium in the blood -an unusual or allergic reaction to ondansetron, granisetron, other medicines, foods, dyes, or preservatives -pregnant or trying to get pregnant -breast-feeding How should I use this medicine? This medicine is for infusion into a vein. It is given by a health care professional in a hospital or clinic setting. Talk to your pediatrician regarding the use of this medicine in children. Special care may be needed. Overdosage: If you think you have taken too much of this medicine contact a poison control center or emergency room at once. NOTE: This medicine is only for you. Do not share this medicine with others. What if I miss a dose? This does not apply. What may interact with this medicine? Do not take this medicine with any of the following medications: -apomorphine  This medicine may also interact with the following medications: -carbamazepine -phenytoin -rifampicin -tramadol This list may not describe all possible interactions. Give your health care provider a list of  all the medicines, herbs, non-prescription drugs, or dietary supplements you use. Also tell them if you smoke, drink alcohol, or use illegal drugs. Some items may interact with your medicine. What should I watch for while using this medicine? Your condition will be monitored carefully while you are receiving this medicine. What side effects may I  notice from receiving this medicine? Side effects that you should report to your doctor or health care professional as soon as possible: -allergic reactions like skin rash, itching or hives, swelling of the face, lips, or tongue -breathing problems -dizziness -fast or irregular heartbeat -feeling faint or lightheaded, falls -fever and chills -swelling of the hands and feet -tightness in the chest Side effects that usually do not require medical attention (report to your doctor or health care professional if they continue or are bothersome): -constipation or diarrhea -headache This list may not describe all possible side effects. Call your doctor for medical advice about side effects. You may report side effects to FDA at 1-800-FDA-1088. Where should I keep my medicine? This drug is given in a hospital or clinic and will not be stored at home. NOTE: This sheet is a summary. It may not cover all possible information. If you have questions about this medicine, talk to your doctor, pharmacist, or health care provider.  2012, [employer] Standard. (08/30/2010 11:37:58 AM)  Prochlorperazine tablets (Compazine) What is this medicine? PROCHLORPERAZINE (proe klor PER a zeen) helps to control severe nausea and vomiting. This medicine is also used to treat schizophrenia. It can also help patients who experience anxiety that is not due to psychological illness. This medicine may be used for other purposes; ask your health care provider or pharmacist if you have questions. What should I tell my health care provider before I take this medicine? They need to know if you have any of these conditions: -blood disorders or disease -dementia -liver disease or jaundice -Parkinson's disease -uncontrollable movement disorder -an unusual or allergic reaction to prochlorperazine, other medicines, foods, dyes, or preservatives -pregnant or trying to get pregnant -breast-feeding How should I use this  medicine? Take this medicine by mouth with a glass of water. Follow the directions on the prescription label. Take your doses at regular intervals. Do not take your medicine more often than directed. Do not stop taking this medicine suddenly. This can cause nausea, vomiting, and dizziness. Ask your doctor or health care professional for advice. Talk to your pediatrician regarding the use of this medicine in children. Special care may be needed. While this drug may be prescribed for children as young as 2 years for selected conditions, precautions do apply. Overdosage: If you think you have taken too much of this medicine contact a poison control center or emergency room at once. NOTE: This medicine is only for you. Do not share this medicine with others. What if I miss a dose? If you miss a dose, take it as soon as you can. If it is almost time for your next dose, take only that dose. Do not take double or extra doses. What may interact with this medicine? Do not take this medicine with any of the following medications: -amoxapine -antidepressants like citalopram, escitalopram, fluoxetine, paroxetine, and sertraline -deferoxamine -dofetilide -maprotiline -tricyclic antidepressants like amitriptyline, clomipramine, imipramine, nortiptyline and others This medicine may also interact with the following medications: -lithium -medicines for pain -phenytoin -propranolol -warfarin This list may not describe all possible interactions. Give your health care provider a list of all the medicines, herbs, non-prescription drugs,  or dietary supplements you use. Also tell them if you smoke, drink alcohol, or use illegal drugs. Some items may interact with your medicine. What should I watch for while using this medicine? Visit your doctor or health care professional for regular checks on your progress. You may get drowsy or dizzy. Do not drive, use machinery, or do anything that needs mental alertness until  you know how this medicine affects you. Do not stand or sit up quickly, especially if you are an older patient. This reduces the risk of dizzy or fainting spells. Alcohol may interfere with the effect of this medicine. Avoid alcoholic drinks. This medicine can reduce the response of your body to heat or cold. Try not to get overheated. Avoid temperature extremes, such as saunas, hot tubs, or very hot or cold baths or showers. Dress warmly in cold weather. This medicine can make you more sensitive to the sun. Keep out of the sun. If you cannot avoid being in the sun, wear protective clothing and use sunscreen. Do not use sun lamps or tanning beds/booths. Your mouth may get dry. Chewing sugarless gum or sucking hard candy, and drinking plenty of water may help. Contact your doctor if the problem does not go away or is severe. What side effects may I notice from receiving this medicine? Side effects that you should report to your doctor or health care professional as soon as possible: -blurred vision -breast enlargement in men or women -breast milk in women who are not breast-feeding -chest pain, fast or irregular heartbeat -confusion, restlessness -dark yellow or brown urine -difficulty breathing or swallowing -dizziness or fainting spells -drooling, shaking, movement difficulty (shuffling walk) or rigidity -fever, chills, sore throat -involuntary or uncontrollable movements of the eyes, mouth, head, arms, and legs -seizures -stomach area pain -unusually weak or tired -unusual bleeding or bruising -yellowing of skin or eyes Side effects that usually do not require medical attention (report to your doctor or health care professional if they continue or are bothersome): -difficulty passing urine -difficulty sleeping -headache -sexual dysfunction -skin rash, or itching This list may not describe all possible side effects. Call your doctor for medical advice about side effects. You may report  side effects to FDA at 1-800-FDA-1088. Where should I keep my medicine? Keep out of the reach of children. Store at room temperature between 15 and 30 degrees C (59 and 86 degrees F). Protect from light. Throw away any unused medicine after the expiration date. NOTE: This sheet is a summary. It may not cover all possible information. If you have questions about this medicine, talk to your doctor, pharmacist, or health care provider.  2012, [employer]/Gold Standard. (06/28/2008 4:26:50 PM)  Docetaxel injection What is this medicine? DOCETAXEL (Taxotore) (doe se TAX el) is a chemotherapy drug. It targets fast dividing cells, like cancer cells, and causes these cells to die. This medicine is used to treat many types of cancers like breast cancer, certain stomach cancers, head and neck cancer, lung cancer, and prostate cancer. This medicine may be used for other purposes; ask your health care provider or pharmacist if you have questions. What should I tell my health care provider before I take this medicine? They need to know if you have any of these conditions: -infection (especially a virus infection such as chickenpox, cold sores, or herpes) -liver disease -low blood counts, like low white cell, platelet, or red cell counts -an unusual or allergic reaction to docetaxel, polysorbate 80, other chemotherapy agents, other medicines, foods,  dyes, or preservatives -pregnant or trying to get pregnant -breast-feeding How should I use this medicine? This drug is given as an infusion into a vein. It is administered in a hospital or clinic by a specially trained health care professional. Talk to your pediatrician regarding the use of this medicine in children. Special care may be needed. Overdosage: If you think you have taken too much of this medicine contact a poison control center or emergency room at once. NOTE: This medicine is only for you. Do not share this medicine with others. What if I miss a  dose? It is important not to miss your dose. Call your doctor or health care professional if you are unable to keep an appointment. What may interact with this medicine? -cyclosporine -erythromycin -ketoconazole -medicines to increase blood counts like filgrastim, pegfilgrastim, sargramostim -vaccines Talk to your doctor or health care professional before taking any of these medicines: -acetaminophen -aspirin -ibuprofen -ketoprofen -naproxen This list may not describe all possible interactions. Give your health care provider a list of all the medicines, herbs, non-prescription drugs, or dietary supplements you use. Also tell them if you smoke, drink alcohol, or use illegal drugs. Some items may interact with your medicine. What should I watch for while using this medicine? Your condition will be monitored carefully while you are receiving this medicine. You will need important blood work done while you are taking this medicine. This drug may make you feel generally unwell. This is not uncommon, as chemotherapy can affect healthy cells as well as cancer cells. Report any side effects. Continue your course of treatment even though you feel ill unless your doctor tells you to stop. In some cases, you may be given additional medicines to help with side effects. Follow all directions for their use. Call your doctor or health care professional for advice if you get a fever, chills or sore throat, or other symptoms of a cold or flu. Do not treat yourself. This drug decreases your body's ability to fight infections. Try to avoid being around people who are sick. This medicine may increase your risk to bruise or bleed. Call your doctor or health care professional if you notice any unusual bleeding. Be careful brushing and flossing your teeth or using a toothpick because you may get an infection or bleed more easily. If you have any dental work done, tell your dentist you are receiving this  medicine. Avoid taking products that contain aspirin, acetaminophen, ibuprofen, naproxen, or ketoprofen unless instructed by your doctor. These medicines may hide a fever. Do not become pregnant while taking this medicine. Women should inform their doctor if they wish to become pregnant or think they might be pregnant. There is a potential for serious side effects to an unborn child. Talk to your health care professional or pharmacist for more information. Do not breast-feed an infant while taking this medicine. What side effects may I notice from receiving this medicine? Side effects that you should report to your doctor or health care professional as soon as possible: -allergic reactions like skin rash, itching or hives, swelling of the face, lips, or tongue -low blood counts - This drug may decrease the number of white blood cells, red blood cells and platelets. You may be at increased risk for infections and bleeding. -signs of infection - fever or chills, cough, sore throat, pain or difficulty passing urine -signs of decreased platelets or bleeding - bruising, pinpoint red spots on the skin, black, tarry stools, nosebleeds -signs of decreased  red blood cells - unusually weak or tired, fainting spells, lightheadedness -breathing problems -fast or irregular heartbeat -low blood pressure -mouth sores -nausea and vomiting -pain, swelling, redness or irritation at the injection site -pain, tingling, numbness in the hands or feet -swelling of the ankle, feet, hands -weight gain Side effects that usually do not require medical attention (report to your prescriber or health care professional if they continue or are bothersome): -bone pain -complete hair loss including hair on your head, underarms, pubic hair, eyebrows, and eyelashes -diarrhea -excessive tearing -changes in the color of fingernails -loosening of the fingernails -nausea -muscle pain -red flush to skin -sweating -weak or  tired This list may not describe all possible side effects. Call your doctor for medical advice about side effects. You may report side effects to FDA at 1-800-FDA-1088. Where should I keep my medicine? This drug is given in a hospital or clinic and will not be stored at home. NOTE: This sheet is a summary. It may not cover all possible information. If you have questions about this medicine, talk to your doctor, pharmacist, or health care provider.  2013, [employer] Standard. (11/08/2008 11:52:10 AM)  Carboplatin injection What is this medicine? CARBOPLATIN (KAR boe pla tin) is a chemotherapy drug. It targets fast dividing cells, like cancer cells, and causes these cells to die. This medicine is used to treat ovarian cancer and many other cancers. This medicine may be used for other purposes; ask your health care provider or pharmacist if you have questions. What should I tell my health care provider before I take this medicine? They need to know if you have any of these conditions: -blood disorders -hearing problems -kidney disease -recent or ongoing radiation therapy -an unusual or allergic reaction to carboplatin, cisplatin, other chemotherapy, other medicines, foods, dyes, or preservatives -pregnant or trying to get pregnant -breast-feeding How should I use this medicine? This drug is usually given as an infusion into a vein. It is administered in a hospital or clinic by a specially trained health care professional. Talk to your pediatrician regarding the use of this medicine in children. Special care may be needed. Overdosage: If you think you have taken too much of this medicine contact a poison control center or emergency room at once. NOTE: This medicine is only for you. Do not share this medicine with others. What if I miss a dose? It is important not to miss a dose. Call your doctor or health care professional if you are unable to keep an appointment. What may interact with  this medicine? -medicines for seizures -medicines to increase blood counts like filgrastim, pegfilgrastim, sargramostim -some antibiotics like amikacin, gentamicin, neomycin, streptomycin, tobramycin -vaccines Talk to your doctor or health care professional before taking any of these medicines: -acetaminophen -aspirin -ibuprofen -ketoprofen -naproxen This list may not describe all possible interactions. Give your health care provider a list of all the medicines, herbs, non-prescription drugs, or dietary supplements you use. Also tell them if you smoke, drink alcohol, or use illegal drugs. Some items may interact with your medicine. What should I watch for while using this medicine? Your condition will be monitored carefully while you are receiving this medicine. You will need important blood work done while you are taking this medicine. This drug may make you feel generally unwell. This is not uncommon, as chemotherapy can affect healthy cells as well as cancer cells. Report any side effects. Continue your course of treatment even though you feel ill unless your doctor tells  you to stop. In some cases, you may be given additional medicines to help with side effects. Follow all directions for their use. Call your doctor or health care professional for advice if you get a fever, chills or sore throat, or other symptoms of a cold or flu. Do not treat yourself. This drug decreases your body's ability to fight infections. Try to avoid being around people who are sick. This medicine may increase your risk to bruise or bleed. Call your doctor or health care professional if you notice any unusual bleeding. Be careful brushing and flossing your teeth or using a toothpick because you may get an infection or bleed more easily. If you have any dental work done, tell your dentist you are receiving this medicine. Avoid taking products that contain aspirin, acetaminophen, ibuprofen, naproxen, or ketoprofen  unless instructed by your doctor. These medicines may hide a fever. Do not become pregnant while taking this medicine. Women should inform their doctor if they wish to become pregnant or think they might be pregnant. There is a potential for serious side effects to an unborn child. Talk to your health care professional or pharmacist for more information. Do not breast-feed an infant while taking this medicine. What side effects may I notice from receiving this medicine? Side effects that you should report to your doctor or health care professional as soon as possible: -allergic reactions like skin rash, itching or hives, swelling of the face, lips, or tongue -signs of infection - fever or chills, cough, sore throat, pain or difficulty passing urine -signs of decreased platelets or bleeding - bruising, pinpoint red spots on the skin, black, tarry stools, nosebleeds -signs of decreased red blood cells - unusually weak or tired, fainting spells, lightheadedness -breathing problems -changes in hearing -changes in vision -chest pain -high blood pressure -low blood counts - This drug may decrease the number of white blood cells, red blood cells and platelets. You may be at increased risk for infections and bleeding. -nausea and vomiting -pain, swelling, redness or irritation at the injection site -pain, tingling, numbness in the hands or feet -problems with balance, talking, walking -trouble passing urine or change in the amount of urine Side effects that usually do not require medical attention (report to your doctor or health care professional if they continue or are bothersome): -hair loss -loss of appetite -metallic taste in the mouth or changes in taste This list may not describe all possible side effects. Call your doctor for medical advice about side effects. You may report side effects to FDA at 1-800-FDA-1088. Where should I keep my medicine? This drug is given in a hospital or clinic and  will not be stored at home. NOTE: This sheet is a summary. It may not cover all possible information. If you have questions about this medicine, talk to your doctor, pharmacist, or health care provider.  2012, [employer] Standard. (03/02/2008 2:38:05 PM)  Trastuzumab injection for infusion What is this medicine? TRASTUZUMAB (tras TOO zoo mab) is a monoclonal antibody. It targets a protein called HER2. This protein is found in some stomach and breast cancers. This medicine can stop cancer cell growth. This medicine may be used with other cancer treatments. This medicine may be used for other purposes; ask your health care provider or pharmacist if you have questions. What should I tell my health care provider before I take this medicine? They need to know if you have any of these conditions: -heart disease -heart failure -infection (especially a virus  infection such as chickenpox, cold sores, or herpes) -lung or breathing disease, like asthma -recent or ongoing radiation therapy -an unusual or allergic reaction to trastuzumab, benzyl alcohol, or other medications, foods, dyes, or preservatives -pregnant or trying to get pregnant -breast-feeding How should I use this medicine? This drug is given as an infusion into a vein. It is administered in a hospital or clinic by a specially trained health care professional. Talk to your pediatrician regarding the use of this medicine in children. This medicine is not approved for use in children. Overdosage: If you think you have taken too much of this medicine contact a poison control center or emergency room at once. NOTE: This medicine is only for you. Do not share this medicine with others. What if I miss a dose? It is important not to miss a dose. Call your doctor or health care professional if you are unable to keep an appointment. What may interact with this medicine? -cyclophosphamide -doxorubicin -warfarin This list may not describe all  possible interactions. Give your health care provider a list of all the medicines, herbs, non-prescription drugs, or dietary supplements you use. Also tell them if you smoke, drink alcohol, or use illegal drugs. Some items may interact with your medicine. What should I watch for while using this medicine? Visit your doctor for checks on your progress. Report any side effects. Continue your course of treatment even though you feel ill unless your doctor tells you to stop. Call your doctor or health care professional for advice if you get a fever, chills or sore throat, or other symptoms of a cold or flu. Do not treat yourself. Try to avoid being around people who are sick. You may experience fever, chills and shaking during your first infusion. These effects are usually mild and can be treated with other medicines. Report any side effects during the infusion to your health care professional. Fever and chills usually do not happen with later infusions. What side effects may I notice from receiving this medicine? Side effects that you should report to your doctor or other health care professional as soon as possible: -breathing difficulties -chest pain or palpitations -cough -dizziness or fainting -fever or chills, sore throat -skin rash, itching or hives -swelling of the legs or ankles -unusually weak or tired Side effects that usually do not require medical attention (report to your doctor or other health care professional if they continue or are bothersome): -loss of appetite -headache -muscle aches -nausea This list may not describe all possible side effects. Call your doctor for medical advice about side effects. You may report side effects to FDA at 1-800-FDA-1088. Where should I keep my medicine? This drug is given in a hospital or clinic and will not be stored at home. NOTE: This sheet is a summary. It may not cover all possible information. If you have questions about this medicine, talk  to your doctor, pharmacist, or health care provider.  2012, Elsevier/Gold Standard. (09/30/2009 1:43:15 PM)

## 2013-01-26 NOTE — Progress Notes (Signed)
OFFICE PROGRESS NOTE  CC**  DOOLITTLE, Harrel Lemon, MD [address] [place] [address] Dr. Almond Lint Dr. Lurline Hare  DIAGNOSIS: 52 year old female with invasive ductal carcinoma of the right breast diagnosed December 2013.  PRIOR THERAPY:  #1 patient was originally seen in the multidisciplinary breast clinic on 12/17/2012 with a screening mammogram that showed an abnormality in the right breast. This abnormality measured 1.6 cm. She had a biopsy performed that showed invasive ductal carcinoma grade 2, ER negative, PR negative, HER-2 positive with a Ki-67 of 80%.  #2 patient is now status post right lumpectomy on 12/31/2012 with the pathology revealing a 8 cm invasive ductal carcinoma grade 3 with lymphovascular invasion, ER negative PR negative HER-2/neu positive with amplification of 4.36, 2 sentinel nodes were negative for metastatic disease. Patient did have positive margins and she will need a reexcision which will be performed on 01/14/2013. Patient has a Port-A-Cath in place.  #3 patient will proceed with adjuvant chemotherapy initially consisting of Taxotere carboplatinum and Herceptin. With Taxotere carboplatinum to be given every 21 days and Herceptin on a weekly basis for a total of 6 cycles of TC.we will plan on starting her first cycle of chemotherapy on 01/26/2013.  CURRENT THERAPY: TCH cycle 1 day 1 INTERVAL HISTORY: TEAGYN FISHEL 52 y.o. female returns for evaluation for her first cycle of TCH.  She's doing well.  She underwent Re-excision and had negative margins on 01/14/13.  Her Echo was 01/06/13 and showed an EF of 55%, she also saw Dr. Gala Romney who started her on Lipitor and Carvedilol, both of which she is tolerating well.  She has come prepared with questions regarding chemotherapy, and she is feeling well.  She is experiencing mild itching and facial flushing with her steroids. She denies fevers, chills, or any other concerns.  A 10 point ROS is neg.    MEDICAL HISTORY: Past Medical History  Diagnosis Date  . Breast cancer   . Hyperlipidemia   . Diverticulitis   . Hernia, hiatal   . Heartburn   . Anxiety   . Hot flashes   . PONV (postoperative nausea and vomiting)     ALLERGIES:  is allergic to codeine; medralone; and oxycodone-acetaminophen.  MEDICATIONS:  Current Outpatient Prescriptions  Medication Sig Dispense Refill  . atorvastatin (LIPITOR) 40 MG tablet Take 40 mg by mouth every evening.      . carvedilol (COREG) 6.25 MG tablet Take 1 tablet (6.25 mg total) by mouth 2 (two) times daily with a meal.  60 tablet  6  . dexamethasone (DECADRON) 4 MG tablet Take 2 tablets (8 mg total) by mouth 2 (two) times daily with a meal. Take two times a day the day before Taxotere. Then take two times a day starting the day after chemo for 3 days.  30 tablet  1  . ibuprofen (ADVIL,MOTRIN) 200 MG tablet Take 400 mg by mouth every 6 (six) hours as needed. Pain      . lidocaine-prilocaine (EMLA) cream Apply topically as needed.  30 g  8  . LORazepam (ATIVAN) 0.5 MG tablet Take 1 tablet (0.5 mg total) by mouth every 6 (six) hours as needed (Nausea or vomiting).  30 tablet  0  . omeprazole (PRILOSEC) 20 MG capsule Take 20 mg by mouth daily.      . ondansetron (ZOFRAN) 8 MG tablet Take 1 tablet (8 mg total) by mouth 2 (two) times daily. Take two times a day starting the day after chemo for  3 days. Then take two times a day as needed for nausea or vomiting.  30 tablet  1  . Probiotic Product (PROBIOTIC DAILY PO) Take 1 tablet by mouth daily.       Marland Kitchen HYDROcodone-acetaminophen (NORCO) 5-325 MG per tablet Take 1-2 tablets by mouth every 6 (six) hours as needed for pain.  15 tablet  1  . prochlorperazine (COMPAZINE) 10 MG tablet Take 1 tablet (10 mg total) by mouth every 6 (six) hours as needed (Nausea or vomiting).  30 tablet  1  . prochlorperazine (COMPAZINE) 25 MG suppository Place 1 suppository (25 mg total) rectally every 12 (twelve) hours as needed  for nausea.  12 suppository  3  . UNABLE TO FIND Cranial prosthesis due to chemotherapy induced hair loss.  1 Units  0   No current facility-administered medications for this visit.   Facility-Administered Medications Ordered in Other Visits  Medication Dose Route Frequency Provider Last Rate Last Dose  . 0.9 %  sodium chloride infusion   Intravenous Once Victorino December, MD      . acetaminophen (TYLENOL) tablet 650 mg  650 mg Oral Once Victorino December, MD      . CARBOplatin (PARAPLATIN) 900 mg in sodium chloride 0.9 % 500 mL chemo infusion  900 mg Intravenous Once Victorino December, MD      . diphenhydrAMINE (BENADRYL) capsule 50 mg  50 mg Oral Once Victorino December, MD      . heparin lock flush 100 unit/mL  500 Units Intracatheter Once PRN Victorino December, MD      . sodium chloride 0.9 % injection 10 mL  10 mL Intracatheter PRN Victorino December, MD      . trastuzumab (HERCEPTIN) 420 mg in sodium chloride 0.9 % 250 mL chemo infusion  4 mg/kg (Treatment Plan Actual) Intravenous Once Victorino December, MD        SURGICAL HISTORY:  Past Surgical History  Procedure Laterality Date  . Cesarean section      x 3  . Tubal ligation    . Tonsillectomy and adenoidectomy    . Cholecystectomy  2001    lap choli  . Breast lumpectomy with sentinel lymph node biopsy  12/31/2012    Procedure: BREAST LUMPECTOMY WITH SENTINEL LYMPH NODE BX;  Surgeon: Almond Lint, MD;  Location: [facility];  Service: General;  Laterality: Right;  . Portacath placement  12/31/2012    Procedure: INSERTION PORT-A-CATH;  Surgeon: Almond Lint, MD;  Location: [facility];  Service: General;  Laterality: Left;  Left Subclavian Vein  . Re-excision of breast cancer,superior margins  01/14/2013    Procedure: RE-EXCISION OF BREAST CANCER,SUPERIOR MARGINS;  Surgeon: Almond Lint, MD;  Location: [facility];  Service: General;  Laterality: Right;  right breast re excision for markers     REVIEW OF SYSTEMS:   General:  fatigue (-), night sweats (-), fever (-), pain (-) Lymph: palpable nodes (-) HEENT: vision changes (-), mucositis (-), gum bleeding (-), epistaxis (-) Cardiovascular: chest pain (-), palpitations (-) Pulmonary: shortness of breath (-), dyspnea on exertion (-), cough (-), hemoptysis (-) GI:  Early satiety (-), melena (-), dysphagia (-), nausea/vomiting (-), diarrhea (-) GU: dysuria (-), hematuria (-), incontinence (-) Musculoskeletal: joint swelling (-), joint pain (-), back pain (-) Neuro: weakness (-), numbness (-), headache (-), confusion (-) Skin: Rash (-), lesions (-), dryness (-) Psych: depression (-), suicidal/homicidal ideation (-), feeling of hopelessness (-)  PHYSICAL EXAMINATION: Blood  pressure 147/93, pulse 93, temperature 98.4 F (36.9 C), temperature source Oral, resp. rate 20, height 5\' 6"  (1.676 m), weight 231 lb 12.8 oz (105.144 kg), last menstrual period 01/07/2009. Body mass index is 37.43 kg/(m^2). General: Patient is a well appearing female in no acute distress HEENT: PERRLA, sclerae anicteric no conjunctival pallor, MMM Neck: supple, no palpable adenopathy Lungs: clear to auscultation bilaterally, no wheezes, rhonchi, or rales Cardiovascular: regular rate rhythm, S1, S2, no murmurs, rubs or gallops Abdomen: Soft, non-tender, non-distended, normoactive bowel sounds, no HSM Extremities: warm and well perfused, no clubbing, cyanosis, or edema Skin: No rashes or lesions Neuro: Non-focal ECOG PERFORMANCE STATUS: 0 - Asymptomatic Right breast lumpectomy scar healed, no nodularity, left breast, no masses or nipple discharge   LABORATORY DATA: Lab Results  Component Value Date   WBC 15.4* 01/26/2013   HGB 12.1 01/26/2013   HCT 37.1 01/26/2013   MCV 78.9* 01/26/2013   PLT 268 01/26/2013      Chemistry      Component Value Date/Time   NA 140 01/26/2013 0921   NA 142 01/24/2011 2235   K 4.0 01/26/2013 0921   K 3.9 01/24/2011 2235   CL 105 01/26/2013 0921   CL 106  01/24/2011 2235   CO2 24 01/26/2013 0921   CO2 27 01/24/2011 2235   BUN 14.9 01/26/2013 0921   BUN 7 01/24/2011 2235   CREATININE 0.6 01/26/2013 0921   CREATININE 0.56 01/24/2011 2235      Component Value Date/Time   CALCIUM 10.6* 01/26/2013 0921   CALCIUM 10.0 01/24/2011 2235   ALKPHOS 109 01/26/2013 0921   AST 10 01/26/2013 0921   ALT 21 01/26/2013 0921   BILITOT 0.38 01/26/2013 0921    ADDITIONAL INFORMATION: 1. PROGNOSTIC INDICATORS - ACIS Results IMMUNOHISTOCHEMICAL AND MORPHOMETRIC ANALYSIS BY THE AUTOMATED CELLULAR IMAGING SYSTEM (ACIS) Estrogen Receptor (Negative, <1%): 0%, NEGATIVE Progesterone Receptor (Negative, <1%): 0%, NEGATIVE COMMENT: The negative hormone receptor study(ies) in this case have an internal positive control. All controls stained appropriately Pecola Leisure MD Pathologist, Electronic Signature ( Signed 01/07/2013) 1. CHROMOGENIC IN-SITU HYBRIDIZATION Interpretation: HER2/NEU BY CISH - SHOWS AMPLIFICATION BY CISH ANALYSIS. THE RATIO OF HER2: CEP 17 SIGNALS WAS 4.36. Reference range: Ratio: HER2:CEP17 < 1.8 gene amplification not observed Ratio: HER2:CEP 17 1.8-2.2 - equivocal result Ratio: HER2:CEP17 > 2.2 - gene amplification observed 1 of 4 FINAL for ELLYN, RUBIANO ([id number]) ADDITIONAL INFORMATION:(continued) Pecola Leisure MD Pathologist, Electronic Signature ( Signed 01/07/2013) FINAL DIAGNOSIS Diagnosis 1. Breast, lumpectomy, Right - INVASIVE DUCTAL CARCINOMA, GRADE III (1.8 CM), SEE COMMENT. - LYMPHOVASCULAR INVASION IDENTIFIED. - INVASIVE TUMOR IS 1 MM TO NEAREST MARGIN (POSTERIOR). - DUCTAL CARCINOMA IN SITU, GRADE III WITH COMEDO NECROSIS. - IN SITU CARCINOMA IS LESS THAN 0.1 MM FROM ANTERIOR-INFERIOR AND POSTERIOR MARGINS. - SEE TUMOR TEMPLATE BELOW. 2. Lymph node, sentinel, biopsy, Right axillary - ONE LYMPH NODE, NEGATIVE FOR TUMOR (0/1), SEE COMMENT. 3. Lymph node, sentinel, biopsy, Right axillary - ONE LYMPH NODE, NEGATIVE FOR TUMOR  (0/1), SEE COMMENT. Microscopic Comment 1. BREAST, INVASIVE TUMOR, WITH LYMPH NODE SAMPLING Specimen, including laterality: Right breast Procedure: Lumpectomy Grade: III of III Tubule formation: II Nuclear pleomorphism: III Mitotic:III Tumor size (gross measurement): 1.8 cm Margins: Invasive, distance to closest margin: 0.6 cm In-situ, distance to closest margin: less than 0.1 cm If margin positive, focally or broadly: N/A Lymphovascular invasion: Present Ductal carcinoma in situ: Present Grade: III of III Extensive intraductal component: Absent Lobular neoplasia: Absent Tumor focality: Unifocal Treatment effect:  None If present, treatment effect in breast tissue, lymph nodes or both: N/A Extent of tumor: Skin: N/A Nipple: N/A Skeletal muscle: N/A Lymph nodes: # examined: 2 Lymph nodes with metastasis: 0 Breast prognostic profile: Estrogen receptor: Repeated, previous study demonstrated 0% positivity ([id number]) 2 of 4 FINAL for ARETA, TERWILLIGER ([id number]) Microscopic Comment(continued) Progesterone receptor: Repeated, previous study demonstrated 0% positivity ([id number]) Her 2 neu: Repeated, previous study demonstrated amplification (3.88) ([id number]) Ki-67: Not repeated, previous study demonstrated 83% proliferation rate ([id number]) Non-neoplastic breast: Previous biopsy site, fibrocystic change and microcalcifications TNM: pT1c pN0 pMX   RADIOGRAPHIC STUDIES:  Mr Breast Bilateral W Wo Contrast  12/23/2012  *RADIOLOGY REPORT*  Clinical Data: recent diagnosis of invasive ductal carcinoma 12 o'clock right breast, for treatment planning  BILATERAL BREAST MRI WITH AND WITHOUT CONTRAST  Technique: Multiplanar, multisequence MR images of both breasts were obtained prior to and following the intravenous administration of 20ml of multihance.  Three dimensional images were evaluated at the independent DynaCad workstation.  Comparison:  mammographic and ultrasound images  12/08/12 and 11/20/12  Findings: There is mild background parenchymal enhancement.  The left breast is negative. There is no evidence of axillary or internal mammary adenopathy.  There is no abnormal skin or nipple enhancement.  On the right, associated with the recently placed biopsy marker clip, there is an enhancing spiculated mass in the 12 o'clock position 11cm from the nipple.  The mass measures 23 x 12mm transverse dimension by 35mm craniocaudal dimension.  1cm anterior and slightly inferior to the mass is a second 7mm mass which is connected to the dominant mass by a thin bridge of enhancement. There are no other abnormalities in the right breast.  IMPRESSION: Biopsy-proven invasive carcinoma in the 12 o'clock position of the right breast, with an additional satellite focus as described above.  BI-RADS CATEGORY 6:  Known biopsy-proven malignancy - appropriate action should be taken.  THREE-DIMENSIONAL MR IMAGE RENDERING ON INDEPENDENT WORKSTATION:  Three-dimensional MR images were rendered by post-processing of the original MR data on an independent workstation.  The three- dimensional MR images were interpreted, and findings were reported in the accompanying complete MRI report for this study.   Original Report Authenticated By: Esperanza Heir, M.D.    Nm Sentinel Node Inj-no Rpt (breast)  12/31/2012  CLINICAL DATA: right breast cancer   Sulfur colloid was injected intradermally by the nuclear medicine  technologist for breast cancer sentinel node localization.     Dg Chest Port 1 View  12/31/2012  *RADIOLOGY REPORT*  Clinical Data: Port-A-Cath placement.  History of breast cancer.  PORTABLE CHEST - 1 VIEW  Comparison: 03/26/2011.  Findings: Central line has been placed from the left.  The tip is at the level of the distal superior vena cava/caval atrial junction.  No gross pneumothorax.  Consolidation medial aspect of the lung bases may be present possibly representing atelectasis or infiltrate.   Follow-up two- view chest with better inspiration recommended for further delineation.  Heart size top normal.  Mild central pulmonary vascular prominence.  Surgical clips project over the right lower chest and may be related to breast reconstruction/axillary lymph node dissection.  IMPRESSION: Central line has been placed from the left.  The tip is at the level of the distal superior vena cava/caval atrial junction.  No gross pneumothorax.  Consolidation medial aspect of the lung bases may be present possibly representing atelectasis or infiltrate.  Follow-up two- view chest with better inspiration recommended for further delineation.   Original Report  Authenticated By: Lacy Duverney, M.D.    Dg Fluoro Guide Cv Line-no Report  12/31/2012  CLINICAL DATA: portacath   FLOURO GUIDE CV LINE  Fluoroscopy was utilized by the requesting physician.  No radiographic  interpretation.      ASSESSMENT: 52 year old female with  #1 new diagnosis of invasive ductal carcinoma of the right breast she is now status post lumpectomy with sentinel lymph node biopsy with the final pathology revealing a 1.8 cm invasive ductal carcinoma grade 3 ER negative PR negative HER-2/neu amplified at 4.36. 2 sentinel nodes were negative for metastatic disease. There was noted to be lymphovascular invasion. Postoperatively patient is doing well. She will go for a reexcision for positive margin.  #2 she will need adjuvant chemotherapy consisting of Taxotere carboplatinum given every 21 days for a total of 6 cycles along with Herceptin initially given on weekly basis for duration of her chemotherapy. Once patient starts radiation therapy her Herceptin will begin every 3 weeks to finish out one year of therapy.  #3 patient has had chemotherapy teaching class all of her anti-emetics are sent to her pharmacy and she is also going to be seeing in cardiology in the CHF clinic for prevention of Herceptin related cardiac disease.  PLAN:   #1  Ms. Woodfin will proceed with [facility] today.  She will start taking Claritin daily tomorrow.  She knows to call us should she not be able to tolerate the decadron.  We discussed her chemotherapy, possible side effects, things to pay attention to, and reasons to call the office.  #2 she will return next week for weekly herceptin.  All questions were answered. The patient knows to call the clinic with any problems, questions or concerns. We can certainly see the patient much sooner if necessary.  I spent 25 minutes counseling the patient face to face. The total time spent in the appointment was 30 minutes.  Cherie Ouch Lyn Hollingshead, NP Medical Oncology [facility] Phone: [phone]

## 2013-01-27 ENCOUNTER — Ambulatory Visit (HOSPITAL_BASED_OUTPATIENT_CLINIC_OR_DEPARTMENT_OTHER): Payer: BC Managed Care – PPO

## 2013-01-27 ENCOUNTER — Telehealth: Payer: Self-pay | Admitting: *Deleted

## 2013-01-27 VITALS — BP 124/79 | HR 79 | Temp 97.9°F

## 2013-01-27 DIAGNOSIS — C50419 Malignant neoplasm of upper-outer quadrant of unspecified female breast: Secondary | ICD-10-CM

## 2013-01-27 DIAGNOSIS — Z5189 Encounter for other specified aftercare: Secondary | ICD-10-CM

## 2013-01-27 DIAGNOSIS — C50919 Malignant neoplasm of unspecified site of unspecified female breast: Secondary | ICD-10-CM

## 2013-01-27 MED ORDER — PEGFILGRASTIM INJECTION 6 MG/0.6ML
6.0000 mg | Freq: Once | SUBCUTANEOUS | Status: AC
  Administered 2013-01-27: 6 mg via SUBCUTANEOUS
  Filled 2013-01-27: qty 0.6

## 2013-01-27 NOTE — Telephone Encounter (Signed)
Message copied by Augusto Garbe on Tue Jan 27, 2013  2:25 PM ------      Message from: Kallie Locks      Created: Mon Jan 26, 2013  5:52 PM      Regarding: "1st time chemotherapy"       Patient received 1st time Taxotere, Carboplatin, Herceptin, per Dr. Welton Flakes; tolerated treatment well. ------

## 2013-01-27 NOTE — Telephone Encounter (Signed)
Message left requesting a return call for f/u.  Awaiting return call from patient.

## 2013-01-28 ENCOUNTER — Telehealth (INDEPENDENT_AMBULATORY_CARE_PROVIDER_SITE_OTHER): Payer: Self-pay

## 2013-01-28 NOTE — Telephone Encounter (Signed)
[name] for pt to call for pathology results.

## 2013-02-02 ENCOUNTER — Encounter: Payer: Self-pay | Admitting: Adult Health

## 2013-02-02 ENCOUNTER — Ambulatory Visit (HOSPITAL_BASED_OUTPATIENT_CLINIC_OR_DEPARTMENT_OTHER): Payer: BC Managed Care – PPO

## 2013-02-02 ENCOUNTER — Other Ambulatory Visit (HOSPITAL_BASED_OUTPATIENT_CLINIC_OR_DEPARTMENT_OTHER): Payer: BC Managed Care – PPO | Admitting: Lab

## 2013-02-02 ENCOUNTER — Ambulatory Visit (HOSPITAL_BASED_OUTPATIENT_CLINIC_OR_DEPARTMENT_OTHER): Payer: BC Managed Care – PPO | Admitting: Adult Health

## 2013-02-02 VITALS — BP 128/88 | HR 92 | Temp 98.6°F | Resp 18 | Ht 66.0 in | Wt 222.2 lb

## 2013-02-02 DIAGNOSIS — R110 Nausea: Secondary | ICD-10-CM

## 2013-02-02 DIAGNOSIS — C50419 Malignant neoplasm of upper-outer quadrant of unspecified female breast: Secondary | ICD-10-CM

## 2013-02-02 DIAGNOSIS — Z171 Estrogen receptor negative status [ER-]: Secondary | ICD-10-CM

## 2013-02-02 DIAGNOSIS — B370 Candidal stomatitis: Secondary | ICD-10-CM

## 2013-02-02 DIAGNOSIS — E860 Dehydration: Secondary | ICD-10-CM

## 2013-02-02 DIAGNOSIS — Z5112 Encounter for antineoplastic immunotherapy: Secondary | ICD-10-CM

## 2013-02-02 LAB — COMPREHENSIVE METABOLIC PANEL (CC13)
AST: 13 U/L (ref 5–34)
Alkaline Phosphatase: 108 U/L (ref 40–150)
Glucose: 141 mg/dl — ABNORMAL HIGH (ref 70–99)
Potassium: 3.9 mEq/L (ref 3.5–5.1)
Sodium: 134 mEq/L — ABNORMAL LOW (ref 136–145)
Total Bilirubin: 0.56 mg/dL (ref 0.20–1.20)
Total Protein: 7.6 g/dL (ref 6.4–8.3)

## 2013-02-02 LAB — CBC WITH DIFFERENTIAL/PLATELET
BASO%: 0.2 % (ref 0.0–2.0)
Basophils Absolute: 0 10*3/uL (ref 0.0–0.1)
EOS%: 1.5 % (ref 0.0–7.0)
MCH: 25.6 pg (ref 25.1–34.0)
MCHC: 33.2 g/dL (ref 31.5–36.0)
MCV: 76.9 fL — ABNORMAL LOW (ref 79.5–101.0)
MONO%: 14.8 % — ABNORMAL HIGH (ref 0.0–14.0)
RBC: 5.16 10*6/uL (ref 3.70–5.45)
RDW: 12.5 % (ref 11.2–14.5)
lymph#: 2.6 10*3/uL (ref 0.9–3.3)
nRBC: 0 % (ref 0–0)

## 2013-02-02 MED ORDER — ACETAMINOPHEN 325 MG PO TABS
650.0000 mg | ORAL_TABLET | Freq: Once | ORAL | Status: AC
  Administered 2013-02-02: 650 mg via ORAL

## 2013-02-02 MED ORDER — SODIUM CHLORIDE 0.9 % IV SOLN
Freq: Once | INTRAVENOUS | Status: AC
  Administered 2013-02-02: 12:00:00 via INTRAVENOUS

## 2013-02-02 MED ORDER — HEPARIN SOD (PORK) LOCK FLUSH 100 UNIT/ML IV SOLN
500.0000 [IU] | Freq: Once | INTRAVENOUS | Status: AC | PRN
  Administered 2013-02-02: 500 [IU]
  Filled 2013-02-02: qty 5

## 2013-02-02 MED ORDER — DIPHENHYDRAMINE HCL 25 MG PO CAPS
50.0000 mg | ORAL_CAPSULE | Freq: Once | ORAL | Status: AC
  Administered 2013-02-02: 50 mg via ORAL

## 2013-02-02 MED ORDER — SODIUM CHLORIDE 0.9 % IJ SOLN
10.0000 mL | INTRAMUSCULAR | Status: DC | PRN
  Administered 2013-02-02: 10 mL
  Filled 2013-02-02: qty 10

## 2013-02-02 MED ORDER — PROCHLORPERAZINE EDISYLATE 5 MG/ML IJ SOLN
10.0000 mg | Freq: Once | INTRAMUSCULAR | Status: AC
  Administered 2013-02-02: 10 mg via INTRAVENOUS

## 2013-02-02 MED ORDER — TRASTUZUMAB CHEMO INJECTION 440 MG
2.0000 mg/kg | Freq: Once | INTRAVENOUS | Status: AC
  Administered 2013-02-02: 210 mg via INTRAVENOUS
  Filled 2013-02-02: qty 10

## 2013-02-02 MED ORDER — NYSTATIN 100000 UNIT/ML MT SUSP: 500000.0000 [IU] | Freq: Four times a day (QID) | OROMUCOSAL | Status: DC

## 2013-02-02 NOTE — Progress Notes (Signed)
OFFICE PROGRESS NOTE  CC**  DOOLITTLE, Harrel Lemon, MD [address] [place] [address] Dr. Almond Lint Dr. Lurline Hare  DIAGNOSIS: 52 year old female with invasive ductal carcinoma of the right breast diagnosed December 2013.  PRIOR THERAPY:  #1 patient was originally seen in the multidisciplinary breast clinic on 12/17/2012 with a screening mammogram that showed an abnormality in the right breast. This abnormality measured 1.6 cm. She had a biopsy performed that showed invasive ductal carcinoma grade 2, ER negative, PR negative, HER-2 positive with a Ki-67 of 80%.  #2 patient is now status post right lumpectomy on 12/31/2012 with the pathology revealing a 8 cm invasive ductal carcinoma grade 3 with lymphovascular invasion, ER negative PR negative HER-2/neu positive with amplification of 4.36, 2 sentinel nodes were negative for metastatic disease. Patient did have positive margins and she will need a reexcision which will be performed on 01/14/2013. Patient has a Port-A-Cath in place.  #3 patient will proceed with adjuvant chemotherapy initially consisting of Taxotere carboplatinum and Herceptin. With Taxotere carboplatinum to be given every 21 days and Herceptin on a weekly basis for a total of 6 cycles of TC.we will plan on starting her first cycle of chemotherapy on 01/26/2013.  CURRENT THERAPY: TCH cycle 1 day 8  INTERVAL HISTORY: KARELLY DEWALT 52 y.o. female returns for evaluation after her first cycle of TCH.  She's tolerated the chemotherapy moderately well.  She is c/o a gritty feeling on her tongue that started 3-4 days ago, numbness in her fingertips and toes, however she's retained her motor function and can button, walk, and fasten earrings.  She is fatigued, had diarrhea for two days, followed by nausea for the past three days.  She's taking her Zofran for the nausea and its helping.  She had significant bone pain after the Neulasta for three days that was relieved by  eventually taking Vicodin.  Otherwise, she denies fevers, chills, or any other concerns.    MEDICAL HISTORY: Past Medical History  Diagnosis Date  . Breast cancer   . Hyperlipidemia   . Diverticulitis   . Hernia, hiatal   . Heartburn   . Anxiety   . Hot flashes   . PONV (postoperative nausea and vomiting)     ALLERGIES:  is allergic to codeine; medralone; and oxycodone-acetaminophen.  MEDICATIONS:  Current Outpatient Prescriptions  Medication Sig Dispense Refill  . atorvastatin (LIPITOR) 40 MG tablet Take 40 mg by mouth every evening.      . carvedilol (COREG) 6.25 MG tablet Take 1 tablet (6.25 mg total) by mouth 2 (two) times daily with a meal.  60 tablet  6  . dexamethasone (DECADRON) 4 MG tablet Take 2 tablets (8 mg total) by mouth 2 (two) times daily with a meal. Take two times a day the day before Taxotere. Then take two times a day starting the day after chemo for 3 days.  30 tablet  1  . HYDROcodone-acetaminophen (NORCO) 5-325 MG per tablet Take 1-2 tablets by mouth every 6 (six) hours as needed for pain.  15 tablet  1  . lidocaine-prilocaine (EMLA) cream Apply topically as needed.  30 g  8  . omeprazole (PRILOSEC) 20 MG capsule Take 20 mg by mouth daily.      . ondansetron (ZOFRAN) 8 MG tablet Take 1 tablet (8 mg total) by mouth 2 (two) times daily. Take two times a day starting the day after chemo for 3 days. Then take two times a day as needed for  nausea or vomiting.  30 tablet  1  . UNABLE TO FIND Cranial prosthesis due to chemotherapy induced hair loss.  1 Units  0  . ibuprofen (ADVIL,MOTRIN) 200 MG tablet Take 400 mg by mouth every 6 (six) hours as needed. Pain      . LORazepam (ATIVAN) 0.5 MG tablet Take 1 tablet (0.5 mg total) by mouth every 6 (six) hours as needed (Nausea or vomiting).  30 tablet  0  . nystatin (MYCOSTATIN) 100000 UNIT/ML suspension Take 5 mLs (500,000 Units total) by mouth 4 (four) times daily.  60 mL  0  . Probiotic Product (PROBIOTIC DAILY PO) Take 1  tablet by mouth daily.       . prochlorperazine (COMPAZINE) 10 MG tablet Take 1 tablet (10 mg total) by mouth every 6 (six) hours as needed (Nausea or vomiting).  30 tablet  1  . prochlorperazine (COMPAZINE) 25 MG suppository Place 1 suppository (25 mg total) rectally every 12 (twelve) hours as needed for nausea.  12 suppository  3   No current facility-administered medications for this visit.   Facility-Administered Medications Ordered in Other Visits  Medication Dose Route Frequency Provider Last Rate Last Dose  . heparin lock flush 100 unit/mL  500 Units Intracatheter Once PRN Victorino December, MD      . sodium chloride 0.9 % injection 10 mL  10 mL Intracatheter PRN Victorino December, MD        SURGICAL HISTORY:  Past Surgical History  Procedure Laterality Date  . Cesarean section      x 3  . Tubal ligation    . Tonsillectomy and adenoidectomy    . Cholecystectomy  2001    lap choli  . Breast lumpectomy with sentinel lymph node biopsy  12/31/2012    Procedure: BREAST LUMPECTOMY WITH SENTINEL LYMPH NODE BX;  Surgeon: Almond Lint, MD;  Location: [facility];  Service: General;  Laterality: Right;  . Portacath placement  12/31/2012    Procedure: INSERTION PORT-A-CATH;  Surgeon: Almond Lint, MD;  Location: [facility];  Service: General;  Laterality: Left;  Left Subclavian Vein  . Re-excision of breast cancer,superior margins  01/14/2013    Procedure: RE-EXCISION OF BREAST CANCER,SUPERIOR MARGINS;  Surgeon: Almond Lint, MD;  Location: [facility];  Service: General;  Laterality: Right;  right breast re excision for markers     REVIEW OF SYSTEMS:   General: fatigue (-), night sweats (-), fever (-), pain (-) Lymph: palpable nodes (-) HEENT: vision changes (-), mucositis (-), gum bleeding (-), epistaxis (-) Cardiovascular: chest pain (-), palpitations (-) Pulmonary: shortness of breath (-), dyspnea on exertion (-), cough (-), hemoptysis (-) GI:  Early satiety (-),  melena (-), dysphagia (-), nausea/vomiting (-), diarrhea (-) GU: dysuria (-), hematuria (-), incontinence (-) Musculoskeletal: joint swelling (-), joint pain (-), back pain (-) Neuro: weakness (-), numbness (-), headache (-), confusion (-) Skin: Rash (-), lesions (-), dryness (-) Psych: depression (-), suicidal/homicidal ideation (-), feeling of hopelessness (-)  PHYSICAL EXAMINATION: Blood pressure 128/88, pulse 92, temperature 98.6 F (37 C), temperature source Oral, resp. rate 18, height 5\' 6"  (1.676 m), weight 222 lb 3 oz (100.784 kg), last menstrual period 01/07/2009. Body mass index is 35.88 kg/(m^2). General: Patient is a well appearing female in no acute distress HEENT: PERRLA, sclerae anicteric no conjunctival pallor, MMM, white patches on tongue Neck: supple, no palpable adenopathy Lungs: clear to auscultation bilaterally, no wheezes, rhonchi, or rales Cardiovascular: regular rate rhythm, S1,  S2, no murmurs, rubs or gallops Abdomen: Soft, non-tender, non-distended, normoactive bowel sounds, no HSM Extremities: warm and well perfused, no clubbing, cyanosis, or edema Skin: No rashes or lesions Neuro: Non-focal ECOG PERFORMANCE STATUS: 0 - Asymptomatic Right breast lumpectomy scar healed, no nodularity, left breast, no masses or nipple discharge   LABORATORY DATA: Lab Results  Component Value Date   WBC 4.6 02/02/2013   HGB 13.2 02/02/2013   HCT 39.7 02/02/2013   MCV 76.9* 02/02/2013   PLT 129* 02/02/2013      Chemistry      Component Value Date/Time   NA 134* 02/02/2013 0956   NA 142 01/24/2011 2235   K 3.9 02/02/2013 0956   K 3.9 01/24/2011 2235   CL 95* 02/02/2013 0956   CL 106 01/24/2011 2235   CO2 28 02/02/2013 0956   CO2 27 01/24/2011 2235   BUN 14.4 02/02/2013 0956   BUN 7 01/24/2011 2235   CREATININE 0.8 02/02/2013 0956   CREATININE 0.56 01/24/2011 2235      Component Value Date/Time   CALCIUM 10.0 02/02/2013 0956   CALCIUM 10.0 01/24/2011 2235   ALKPHOS 108 02/02/2013  0956   AST 13 02/02/2013 0956   ALT 29 02/02/2013 0956   BILITOT 0.56 02/02/2013 0956    ADDITIONAL INFORMATION: 1. PROGNOSTIC INDICATORS - ACIS Results IMMUNOHISTOCHEMICAL AND MORPHOMETRIC ANALYSIS BY THE AUTOMATED CELLULAR IMAGING SYSTEM (ACIS) Estrogen Receptor (Negative, <1%): 0%, NEGATIVE Progesterone Receptor (Negative, <1%): 0%, NEGATIVE COMMENT: The negative hormone receptor study(ies) in this case have an internal positive control. All controls stained appropriately Pecola Leisure MD Pathologist, Electronic Signature ( Signed 01/07/2013) 1. CHROMOGENIC IN-SITU HYBRIDIZATION Interpretation: HER2/NEU BY CISH - SHOWS AMPLIFICATION BY CISH ANALYSIS. THE RATIO OF HER2: CEP 17 SIGNALS WAS 4.36. Reference range: Ratio: HER2:CEP17 < 1.8 gene amplification not observed Ratio: HER2:CEP 17 1.8-2.2 - equivocal result Ratio: HER2:CEP17 > 2.2 - gene amplification observed 1 of 4 FINAL for LAYAL, JAVID ([id number]) ADDITIONAL INFORMATION:(continued) Pecola Leisure MD Pathologist, Electronic Signature ( Signed 01/07/2013) FINAL DIAGNOSIS Diagnosis 1. Breast, lumpectomy, Right - INVASIVE DUCTAL CARCINOMA, GRADE III (1.8 CM), SEE COMMENT. - LYMPHOVASCULAR INVASION IDENTIFIED. - INVASIVE TUMOR IS 1 MM TO NEAREST MARGIN (POSTERIOR). - DUCTAL CARCINOMA IN SITU, GRADE III WITH COMEDO NECROSIS. - IN SITU CARCINOMA IS LESS THAN 0.1 MM FROM ANTERIOR-INFERIOR AND POSTERIOR MARGINS. - SEE TUMOR TEMPLATE BELOW. 2. Lymph node, sentinel, biopsy, Right axillary - ONE LYMPH NODE, NEGATIVE FOR TUMOR (0/1), SEE COMMENT. 3. Lymph node, sentinel, biopsy, Right axillary - ONE LYMPH NODE, NEGATIVE FOR TUMOR (0/1), SEE COMMENT. Microscopic Comment 1. BREAST, INVASIVE TUMOR, WITH LYMPH NODE SAMPLING Specimen, including laterality: Right breast Procedure: Lumpectomy Grade: III of III Tubule formation: II Nuclear pleomorphism: III Mitotic:III Tumor size (gross measurement): 1.8 cm Margins: Invasive,  distance to closest margin: 0.6 cm In-situ, distance to closest margin: less than 0.1 cm If margin positive, focally or broadly: N/A Lymphovascular invasion: Present Ductal carcinoma in situ: Present Grade: III of III Extensive intraductal component: Absent Lobular neoplasia: Absent Tumor focality: Unifocal Treatment effect: None If present, treatment effect in breast tissue, lymph nodes or both: N/A Extent of tumor: Skin: N/A Nipple: N/A Skeletal muscle: N/A Lymph nodes: # examined: 2 Lymph nodes with metastasis: 0 Breast prognostic profile: Estrogen receptor: Repeated, previous study demonstrated 0% positivity ([id number]) 2 of 4 FINAL for RENEKA, NEBERGALL ([id number]) Microscopic Comment(continued) Progesterone receptor: Repeated, previous study demonstrated 0% positivity ([id number]) Her 2 neu: Repeated, previous study demonstrated amplification (3.88) ([id number]) Ki-67:  Not repeated, previous study demonstrated 83% proliferation rate ([id number]) Non-neoplastic breast: Previous biopsy site, fibrocystic change and microcalcifications TNM: pT1c pN0 pMX   RADIOGRAPHIC STUDIES:  Mr Breast Bilateral W Wo Contrast  12/23/2012  *RADIOLOGY REPORT*  Clinical Data: recent diagnosis of invasive ductal carcinoma 12 o'clock right breast, for treatment planning  BILATERAL BREAST MRI WITH AND WITHOUT CONTRAST  Technique: Multiplanar, multisequence MR images of both breasts were obtained prior to and following the intravenous administration of 20ml of multihance.  Three dimensional images were evaluated at the independent DynaCad workstation.  Comparison:  mammographic and ultrasound images 12/08/12 and 11/20/12  Findings: There is mild background parenchymal enhancement.  The left breast is negative. There is no evidence of axillary or internal mammary adenopathy.  There is no abnormal skin or nipple enhancement.  On the right, associated with the recently placed biopsy marker clip, there  is an enhancing spiculated mass in the 12 o'clock position 11cm from the nipple.  The mass measures 23 x 12mm transverse dimension by 35mm craniocaudal dimension.  1cm anterior and slightly inferior to the mass is a second 7mm mass which is connected to the dominant mass by a thin bridge of enhancement. There are no other abnormalities in the right breast.  IMPRESSION: Biopsy-proven invasive carcinoma in the 12 o'clock position of the right breast, with an additional satellite focus as described above.  BI-RADS CATEGORY 6:  Known biopsy-proven malignancy - appropriate action should be taken.  THREE-DIMENSIONAL MR IMAGE RENDERING ON INDEPENDENT WORKSTATION:  Three-dimensional MR images were rendered by post-processing of the original MR data on an independent workstation.  The three- dimensional MR images were interpreted, and findings were reported in the accompanying complete MRI report for this study.   Original Report Authenticated By: Esperanza Heir, M.D.    Nm Sentinel Node Inj-no Rpt (breast)  12/31/2012  CLINICAL DATA: right breast cancer   Sulfur colloid was injected intradermally by the nuclear medicine  technologist for breast cancer sentinel node localization.     Dg Chest Port 1 View  12/31/2012  *RADIOLOGY REPORT*  Clinical Data: Port-A-Cath placement.  History of breast cancer.  PORTABLE CHEST - 1 VIEW  Comparison: 03/26/2011.  Findings: Central line has been placed from the left.  The tip is at the level of the distal superior vena cava/caval atrial junction.  No gross pneumothorax.  Consolidation medial aspect of the lung bases may be present possibly representing atelectasis or infiltrate.  Follow-up two- view chest with better inspiration recommended for further delineation.  Heart size top normal.  Mild central pulmonary vascular prominence.  Surgical clips project over the right lower chest and may be related to breast reconstruction/axillary lymph node dissection.  IMPRESSION: Central line  has been placed from the left.  The tip is at the level of the distal superior vena cava/caval atrial junction.  No gross pneumothorax.  Consolidation medial aspect of the lung bases may be present possibly representing atelectasis or infiltrate.  Follow-up two- view chest with better inspiration recommended for further delineation.   Original Report Authenticated By: Lacy Duverney, M.D.    Dg Fluoro Guide Cv Line-no Report  12/31/2012  CLINICAL DATA: portacath   FLOURO GUIDE CV LINE  Fluoroscopy was utilized by the requesting physician.  No radiographic  interpretation.      ASSESSMENT: 52 year old female with  #1 new diagnosis of invasive ductal carcinoma of the right breast she is now status post lumpectomy with sentinel lymph node biopsy with the final pathology revealing  a 1.8 cm invasive ductal carcinoma grade 3 ER negative PR negative HER-2/neu amplified at 4.36. 2 sentinel nodes were negative for metastatic disease. There was noted to be lymphovascular invasion. Postoperatively patient is doing well. She will go for a reexcision for positive margin.  #2 she will need adjuvant chemotherapy consisting of Taxotere carboplatinum given every 21 days for a total of 6 cycles along with Herceptin initially given on weekly basis for duration of her chemotherapy. Once patient starts radiation therapy her Herceptin will begin every 3 weeks to finish out one year of therapy.  #3 patient has had chemotherapy teaching class all of her anti-emetics are sent to her pharmacy and she is also going to be seeing in cardiology in the CHF clinic for prevention of Herceptin related cardiac disease.  PLAN:   #1 Ms. Donati will proceed with Herceptin only today.  I will add on IV fluids and Compazine today for her nausea and dehydration.  I prescribed Nystatin for her thrush.    #2 she will return next week for weekly herceptin.  All questions were answered. The patient knows to call the clinic with any problems,  questions or concerns. We can certainly see the patient much sooner if necessary.  I spent 25 minutes counseling the patient face to face. The total time spent in the appointment was 30 minutes.  Cherie Ouch Lyn Hollingshead, NP Medical Oncology [facility] Phone: [phone]

## 2013-02-02 NOTE — Patient Instructions (Addendum)
[facility] Discharge Instructions for Patients Receiving Chemotherapy  Today you received the following chemotherapy agents :  Herceptin, IVF. To help prevent nausea and vomiting after your treatment, we encourage you to take your nausea medication as instructed by your physician.    If you develop nausea and vomiting that is not controlled by your nausea medication, call the clinic. If it is after clinic hours your family physician or the after hours number for the clinic or go to the Emergency Department.   BELOW ARE SYMPTOMS THAT SHOULD BE REPORTED IMMEDIATELY:  *FEVER GREATER THAN 100.5 F  *CHILLS WITH OR WITHOUT FEVER  NAUSEA AND VOMITING THAT IS NOT CONTROLLED WITH YOUR NAUSEA MEDICATION  *UNUSUAL SHORTNESS OF BREATH  *UNUSUAL BRUISING OR BLEEDING  TENDERNESS IN MOUTH AND THROAT WITH OR WITHOUT PRESENCE OF ULCERS  *URINARY PROBLEMS  *BOWEL PROBLEMS  UNUSUAL RASH Items with * indicate a potential emergency and should be followed up as soon as possible.  One of the nurses will contact you 24 hours after your treatment. Please let the nurse know about any problems that you may have experienced. Feel free to call the clinic you have any questions or concerns. The clinic phone number is [phone].   I have been informed and understand all the instructions given to me. I know to contact the clinic, my physician, or go to the Emergency Department if any problems should occur. I do not have any questions at this time, but understand that I may call the clinic during office hours   should I have any questions or need assistance in obtaining follow up care.    __________________________________________  _____________  __________ Signature of Patient or Authorized Representative            Date                   Time    __________________________________________ Nurse's Signature

## 2013-02-02 NOTE — Patient Instructions (Signed)
Doing well.  Proceed with Herceptin.  I will give you Nystatin for your mouth.  Please call us if you have any questions or concerns.

## 2013-02-09 ENCOUNTER — Encounter: Payer: Self-pay | Admitting: Adult Health

## 2013-02-09 ENCOUNTER — Ambulatory Visit (HOSPITAL_BASED_OUTPATIENT_CLINIC_OR_DEPARTMENT_OTHER): Payer: BC Managed Care – PPO

## 2013-02-09 ENCOUNTER — Ambulatory Visit (HOSPITAL_BASED_OUTPATIENT_CLINIC_OR_DEPARTMENT_OTHER): Payer: BC Managed Care – PPO | Admitting: Adult Health

## 2013-02-09 ENCOUNTER — Other Ambulatory Visit (HOSPITAL_BASED_OUTPATIENT_CLINIC_OR_DEPARTMENT_OTHER): Payer: BC Managed Care – PPO | Admitting: Lab

## 2013-02-09 VITALS — BP 146/90 | HR 76 | Temp 98.4°F | Resp 20 | Ht 66.0 in | Wt 231.0 lb

## 2013-02-09 DIAGNOSIS — C50419 Malignant neoplasm of upper-outer quadrant of unspecified female breast: Secondary | ICD-10-CM

## 2013-02-09 DIAGNOSIS — Z5112 Encounter for antineoplastic immunotherapy: Secondary | ICD-10-CM

## 2013-02-09 DIAGNOSIS — Z171 Estrogen receptor negative status [ER-]: Secondary | ICD-10-CM

## 2013-02-09 DIAGNOSIS — C50411 Malignant neoplasm of upper-outer quadrant of right female breast: Secondary | ICD-10-CM

## 2013-02-09 LAB — CBC WITH DIFFERENTIAL/PLATELET
Basophils Absolute: 0.1 10*3/uL (ref 0.0–0.1)
HCT: 31.9 % — ABNORMAL LOW (ref 34.8–46.6)
HGB: 10.1 g/dL — ABNORMAL LOW (ref 11.6–15.9)
MCH: 25.2 pg (ref 25.1–34.0)
MONO#: 0.4 10*3/uL (ref 0.1–0.9)
NEUT%: 64.5 % (ref 38.4–76.8)
WBC: 8.3 10*3/uL (ref 3.9–10.3)
lymph#: 2.5 10*3/uL (ref 0.9–3.3)

## 2013-02-09 LAB — COMPREHENSIVE METABOLIC PANEL (CC13)
Albumin: 3.4 g/dL — ABNORMAL LOW (ref 3.5–5.0)
BUN: 8.3 mg/dL (ref 7.0–26.0)
CO2: 26 mEq/L (ref 22–29)
Calcium: 9.1 mg/dL (ref 8.4–10.4)
Chloride: 107 mEq/L (ref 98–107)
Glucose: 130 mg/dl — ABNORMAL HIGH (ref 70–99)
Potassium: 3.8 mEq/L (ref 3.5–5.1)

## 2013-02-09 MED ORDER — TRASTUZUMAB CHEMO INJECTION 440 MG
2.0000 mg/kg | Freq: Once | INTRAVENOUS | Status: AC
  Administered 2013-02-09: 210 mg via INTRAVENOUS
  Filled 2013-02-09: qty 10

## 2013-02-09 MED ORDER — HEPARIN SOD (PORK) LOCK FLUSH 100 UNIT/ML IV SOLN
500.0000 [IU] | Freq: Once | INTRAVENOUS | Status: DC | PRN
  Filled 2013-02-09: qty 5

## 2013-02-09 MED ORDER — DIPHENHYDRAMINE HCL 25 MG PO CAPS
50.0000 mg | ORAL_CAPSULE | Freq: Once | ORAL | Status: AC
  Administered 2013-02-09: 50 mg via ORAL

## 2013-02-09 MED ORDER — SODIUM CHLORIDE 0.9 % IV SOLN
Freq: Once | INTRAVENOUS | Status: AC
  Administered 2013-02-09: 11:00:00 via INTRAVENOUS

## 2013-02-09 MED ORDER — ACETAMINOPHEN 325 MG PO TABS
650.0000 mg | ORAL_TABLET | Freq: Once | ORAL | Status: AC
  Administered 2013-02-09: 650 mg via ORAL

## 2013-02-09 MED ORDER — SODIUM CHLORIDE 0.9 % IJ SOLN
10.0000 mL | INTRAMUSCULAR | Status: DC | PRN
  Filled 2013-02-09: qty 10

## 2013-02-09 NOTE — Progress Notes (Signed)
OFFICE PROGRESS NOTE  CC**  DOOLITTLE, Harrel Lemon, MD [address] [place] [address] Dr. Almond Lint Dr. Lurline Hare  DIAGNOSIS: 52 year old female with invasive ductal carcinoma of the right breast diagnosed December 2013.  PRIOR THERAPY:  #1 patient was originally seen in the multidisciplinary breast clinic on 12/17/2012 with a screening mammogram that showed an abnormality in the right breast. This abnormality measured 1.6 cm. She had a biopsy performed that showed invasive ductal carcinoma grade 2, ER negative, PR negative, HER-2 positive with a Ki-67 of 80%.  #2 patient is now status post right lumpectomy on 12/31/2012 with the pathology revealing a 8 cm invasive ductal carcinoma grade 3 with lymphovascular invasion, ER negative PR negative HER-2/neu positive with amplification of 4.36, 2 sentinel nodes were negative for metastatic disease. Patient did have positive margins and she will need a reexcision which will be performed on 01/14/2013. Patient has a Port-A-Cath in place.  #3 patient will proceed with adjuvant chemotherapy initially consisting of Taxotere carboplatinum and Herceptin. With Taxotere carboplatinum to be given every 21 days and Herceptin on a weekly basis for a total of 6 cycles of TC.we will plan on starting her first cycle of chemotherapy on 01/26/2013.  CURRENT THERAPY: TCH cycle 1 day 15 with weekly Herceptin  INTERVAL HISTORY: Julia Zuniga 52 y.o. female returns for evaluation for her next week of Herceptin.  Her diarrhea, thrush, and numbness has resolved.  She has completed therapy with Nystatin mouth rinse, and is taking Super B complex daily.  She is feeling much better after receiving IV fluids last week.  A 10 point ROS is neg.   MEDICAL HISTORY: Past Medical History  Diagnosis Date  . Breast cancer   . Hyperlipidemia   . Diverticulitis   . Hernia, hiatal   . Heartburn   . Anxiety   . Hot flashes   . PONV (postoperative nausea and  vomiting)     ALLERGIES:  is allergic to codeine; medralone; and oxycodone-acetaminophen.  MEDICATIONS:  Current Outpatient Prescriptions  Medication Sig Dispense Refill  . atorvastatin (LIPITOR) 40 MG tablet Take 40 mg by mouth every evening.      . carvedilol (COREG) 6.25 MG tablet Take 1 tablet (6.25 mg total) by mouth 2 (two) times daily with a meal.  60 tablet  6  . lidocaine-prilocaine (EMLA) cream Apply topically as needed.  30 g  8  . nystatin (MYCOSTATIN) 100000 UNIT/ML suspension Take 5 mLs (500,000 Units total) by mouth 4 (four) times daily.  60 mL  0  . omeprazole (PRILOSEC) 20 MG capsule Take 20 mg by mouth daily.      Marland Kitchen dexamethasone (DECADRON) 4 MG tablet Take 2 tablets (8 mg total) by mouth 2 (two) times daily with a meal. Take two times a day the day before Taxotere. Then take two times a day starting the day after chemo for 3 days.  30 tablet  1  . HYDROcodone-acetaminophen (NORCO) 5-325 MG per tablet Take 1-2 tablets by mouth every 6 (six) hours as needed for pain.  15 tablet  1  . ibuprofen (ADVIL,MOTRIN) 200 MG tablet Take 400 mg by mouth every 6 (six) hours as needed. Pain      . LORazepam (ATIVAN) 0.5 MG tablet Take 1 tablet (0.5 mg total) by mouth every 6 (six) hours as needed (Nausea or vomiting).  30 tablet  0  . ondansetron (ZOFRAN) 8 MG tablet Take 1 tablet (8 mg total) by mouth 2 (two) times  daily. Take two times a day starting the day after chemo for 3 days. Then take two times a day as needed for nausea or vomiting.  30 tablet  1  . Probiotic Product (PROBIOTIC DAILY PO) Take 1 tablet by mouth daily.       . prochlorperazine (COMPAZINE) 10 MG tablet Take 1 tablet (10 mg total) by mouth every 6 (six) hours as needed (Nausea or vomiting).  30 tablet  1  . prochlorperazine (COMPAZINE) 25 MG suppository Place 1 suppository (25 mg total) rectally every 12 (twelve) hours as needed for nausea.  12 suppository  3  . UNABLE TO FIND Cranial prosthesis due to chemotherapy  induced hair loss.  1 Units  0   No current facility-administered medications for this visit.   Facility-Administered Medications Ordered in Other Visits  Medication Dose Route Frequency Julia Zuniga Last Rate Last Dose  . heparin lock flush 100 unit/mL  500 Units Intracatheter Once PRN Victorino December, MD      . sodium chloride 0.9 % injection 10 mL  10 mL Intracatheter PRN Victorino December, MD        SURGICAL HISTORY:  Past Surgical History  Procedure Laterality Date  . Cesarean section      x 3  . Tubal ligation    . Tonsillectomy and adenoidectomy    . Cholecystectomy  2001    lap choli  . Breast lumpectomy with sentinel lymph node biopsy  12/31/2012    Procedure: BREAST LUMPECTOMY WITH SENTINEL LYMPH NODE BX;  Surgeon: Almond Lint, MD;  Location: [facility];  Service: General;  Laterality: Right;  . Portacath placement  12/31/2012    Procedure: INSERTION PORT-A-CATH;  Surgeon: Almond Lint, MD;  Location: [facility];  Service: General;  Laterality: Left;  Left Subclavian Vein  . Re-excision of breast cancer,superior margins  01/14/2013    Procedure: RE-EXCISION OF BREAST CANCER,SUPERIOR MARGINS;  Surgeon: Almond Lint, MD;  Location: [facility];  Service: General;  Laterality: Right;  right breast re excision for markers     REVIEW OF SYSTEMS:   General: fatigue (-), night sweats (-), fever (-), pain (-) Lymph: palpable nodes (-) HEENT: vision changes (-), mucositis (-), gum bleeding (-), epistaxis (-) Cardiovascular: chest pain (-), palpitations (-) Pulmonary: shortness of breath (-), dyspnea on exertion (-), cough (-), hemoptysis (-) GI:  Early satiety (-), melena (-), dysphagia (-), nausea/vomiting (-), diarrhea (-) GU: dysuria (-), hematuria (-), incontinence (-) Musculoskeletal: joint swelling (-), joint pain (-), back pain (-) Neuro: weakness (-), numbness (-), headache (-), confusion (-) Skin: Rash (-), lesions (-), dryness (-) Psych: depression (-),  suicidal/homicidal ideation (-), feeling of hopelessness (-)  PHYSICAL EXAMINATION: Blood pressure 146/90, pulse 76, temperature 98.4 F (36.9 C), temperature source Oral, resp. rate 20, height 5\' 6"  (1.676 m), weight 231 lb (104.781 kg), last menstrual period 01/07/2009. Body mass index is 37.3 kg/(m^2). General: Patient is a well appearing female in no acute distress HEENT: PERRLA, sclerae anicteric no conjunctival pallor, MMM, white patches on tongue Neck: supple, no palpable adenopathy Lungs: clear to auscultation bilaterally, no wheezes, rhonchi, or rales Cardiovascular: regular rate rhythm, S1, S2, no murmurs, rubs or gallops Abdomen: Soft, non-tender, non-distended, normoactive bowel sounds, no HSM Extremities: warm and well perfused, no clubbing, cyanosis, or edema Skin: No rashes or lesions Neuro: Non-focal ECOG PERFORMANCE STATUS: 0 - Asymptomatic Right breast lumpectomy scar healed, no nodularity, left breast, no masses or nipple discharge   LABORATORY  DATA: Lab Results  Component Value Date   WBC 8.3 02/09/2013   HGB 10.1* 02/09/2013   HCT 31.9* 02/09/2013   MCV 79.6 02/09/2013   PLT 150 02/09/2013      Chemistry      Component Value Date/Time   NA 143 02/09/2013 0926   NA 142 01/24/2011 2235   K 3.8 02/09/2013 0926   K 3.9 01/24/2011 2235   CL 107 02/09/2013 0926   CL 106 01/24/2011 2235   CO2 26 02/09/2013 0926   CO2 27 01/24/2011 2235   BUN 8.3 02/09/2013 0926   BUN 7 01/24/2011 2235   CREATININE 0.6 02/09/2013 0926   CREATININE 0.56 01/24/2011 2235      Component Value Date/Time   CALCIUM 9.1 02/09/2013 0926   CALCIUM 10.0 01/24/2011 2235   ALKPHOS 99 02/09/2013 0926   AST 13 02/09/2013 0926   ALT 27 02/09/2013 0926   BILITOT 0.36 02/09/2013 0926    ADDITIONAL INFORMATION: 1. PROGNOSTIC INDICATORS - ACIS Results IMMUNOHISTOCHEMICAL AND MORPHOMETRIC ANALYSIS BY THE AUTOMATED CELLULAR IMAGING SYSTEM (ACIS) Estrogen Receptor (Negative, <1%): 0%, NEGATIVE Progesterone Receptor  (Negative, <1%): 0%, NEGATIVE COMMENT: The negative hormone receptor study(ies) in this case have an internal positive control. All controls stained appropriately Pecola Leisure MD Pathologist, Electronic Signature ( Signed 01/07/2013) 1. CHROMOGENIC IN-SITU HYBRIDIZATION Interpretation: HER2/NEU BY CISH - SHOWS AMPLIFICATION BY CISH ANALYSIS. THE RATIO OF HER2: CEP 17 SIGNALS WAS 4.36. Reference range: Ratio: HER2:CEP17 < 1.8 gene amplification not observed Ratio: HER2:CEP 17 1.8-2.2 - equivocal result Ratio: HER2:CEP17 > 2.2 - gene amplification observed 1 of 4 FINAL for JEROME, OTTER ([id number]) ADDITIONAL INFORMATION:(continued) Pecola Leisure MD Pathologist, Electronic Signature ( Signed 01/07/2013) FINAL DIAGNOSIS Diagnosis 1. Breast, lumpectomy, Right - INVASIVE DUCTAL CARCINOMA, GRADE III (1.8 CM), SEE COMMENT. - LYMPHOVASCULAR INVASION IDENTIFIED. - INVASIVE TUMOR IS 1 MM TO NEAREST MARGIN (POSTERIOR). - DUCTAL CARCINOMA IN SITU, GRADE III WITH COMEDO NECROSIS. - IN SITU CARCINOMA IS LESS THAN 0.1 MM FROM ANTERIOR-INFERIOR AND POSTERIOR MARGINS. - SEE TUMOR TEMPLATE BELOW. 2. Lymph node, sentinel, biopsy, Right axillary - ONE LYMPH NODE, NEGATIVE FOR TUMOR (0/1), SEE COMMENT. 3. Lymph node, sentinel, biopsy, Right axillary - ONE LYMPH NODE, NEGATIVE FOR TUMOR (0/1), SEE COMMENT. Microscopic Comment 1. BREAST, INVASIVE TUMOR, WITH LYMPH NODE SAMPLING Specimen, including laterality: Right breast Procedure: Lumpectomy Grade: III of III Tubule formation: II Nuclear pleomorphism: III Mitotic:III Tumor size (gross measurement): 1.8 cm Margins: Invasive, distance to closest margin: 0.6 cm In-situ, distance to closest margin: less than 0.1 cm If margin positive, focally or broadly: N/A Lymphovascular invasion: Present Ductal carcinoma in situ: Present Grade: III of III Extensive intraductal component: Absent Lobular neoplasia: Absent Tumor focality:  Unifocal Treatment effect: None If present, treatment effect in breast tissue, lymph nodes or both: N/A Extent of tumor: Skin: N/A Nipple: N/A Skeletal muscle: N/A Lymph nodes: # examined: 2 Lymph nodes with metastasis: 0 Breast prognostic profile: Estrogen receptor: Repeated, previous study demonstrated 0% positivity ([id number]) 2 of 4 FINAL for DIEDRA, SINOR ([id number]) Microscopic Comment(continued) Progesterone receptor: Repeated, previous study demonstrated 0% positivity ([id number]) Her 2 neu: Repeated, previous study demonstrated amplification (3.88) ([id number]) Ki-67: Not repeated, previous study demonstrated 83% proliferation rate ([id number]) Non-neoplastic breast: Previous biopsy site, fibrocystic change and microcalcifications TNM: pT1c pN0 pMX   RADIOGRAPHIC STUDIES:  Mr Breast Bilateral W Wo Contrast  12/23/2012  *RADIOLOGY REPORT*  Clinical Data: recent diagnosis of invasive ductal carcinoma 12 o'clock right breast, for treatment planning  BILATERAL BREAST MRI WITH AND WITHOUT CONTRAST  Technique: Multiplanar, multisequence MR images of both breasts were obtained prior to and following the intravenous administration of 20ml of multihance.  Three dimensional images were evaluated at the independent DynaCad workstation.  Comparison:  mammographic and ultrasound images 12/08/12 and 11/20/12  Findings: There is mild background parenchymal enhancement.  The left breast is negative. There is no evidence of axillary or internal mammary adenopathy.  There is no abnormal skin or nipple enhancement.  On the right, associated with the recently placed biopsy marker clip, there is an enhancing spiculated mass in the 12 o'clock position 11cm from the nipple.  The mass measures 23 x 12mm transverse dimension by 35mm craniocaudal dimension.  1cm anterior and slightly inferior to the mass is a second 7mm mass which is connected to the dominant mass by a thin bridge of enhancement.  There are no other abnormalities in the right breast.  IMPRESSION: Biopsy-proven invasive carcinoma in the 12 o'clock position of the right breast, with an additional satellite focus as described above.  BI-RADS CATEGORY 6:  Known biopsy-proven malignancy - appropriate action should be taken.  THREE-DIMENSIONAL MR IMAGE RENDERING ON INDEPENDENT WORKSTATION:  Three-dimensional MR images were rendered by post-processing of the original MR data on an independent workstation.  The three- dimensional MR images were interpreted, and findings were reported in the accompanying complete MRI report for this study.   Original Report Authenticated By: Esperanza Heir, M.D.    Nm Sentinel Node Inj-no Rpt (breast)  12/31/2012  CLINICAL DATA: right breast cancer   Sulfur colloid was injected intradermally by the nuclear medicine  technologist for breast cancer sentinel node localization.     Dg Chest Port 1 View  12/31/2012  *RADIOLOGY REPORT*  Clinical Data: Port-A-Cath placement.  History of breast cancer.  PORTABLE CHEST - 1 VIEW  Comparison: 03/26/2011.  Findings: Central line has been placed from the left.  The tip is at the level of the distal superior vena cava/caval atrial junction.  No gross pneumothorax.  Consolidation medial aspect of the lung bases may be present possibly representing atelectasis or infiltrate.  Follow-up two- view chest with better inspiration recommended for further delineation.  Heart size top normal.  Mild central pulmonary vascular prominence.  Surgical clips project over the right lower chest and may be related to breast reconstruction/axillary lymph node dissection.  IMPRESSION: Central line has been placed from the left.  The tip is at the level of the distal superior vena cava/caval atrial junction.  No gross pneumothorax.  Consolidation medial aspect of the lung bases may be present possibly representing atelectasis or infiltrate.  Follow-up two- view chest with better inspiration  recommended for further delineation.   Original Report Authenticated By: Lacy Duverney, M.D.    Dg Fluoro Guide Cv Line-no Report  12/31/2012  CLINICAL DATA: portacath   FLOURO GUIDE CV LINE  Fluoroscopy was utilized by the requesting physician.  No radiographic  interpretation.      ASSESSMENT: 52 year old female with  #1 new diagnosis of invasive ductal carcinoma of the right breast she is now status post lumpectomy with sentinel lymph node biopsy with the final pathology revealing a 1.8 cm invasive ductal carcinoma grade 3 ER negative PR negative HER-2/neu amplified at 4.36. 2 sentinel nodes were negative for metastatic disease. There was noted to be lymphovascular invasion. Postoperatively patient is doing well. She will go for a reexcision for positive margin.  #2 she will need adjuvant chemotherapy consisting of Taxotere  carboplatinum given every 21 days for a total of 6 cycles along with Herceptin initially given on weekly basis for duration of her chemotherapy. Once patient starts radiation therapy her Herceptin will begin every 3 weeks to finish out one year of therapy.  #3 patient has had chemotherapy teaching class all of her anti-emetics are sent to her pharmacy and she is also going to be seeing in cardiology in the CHF clinic for prevention of Herceptin related cardiac disease.  PLAN:   #1 Ms. Nicklaus will proceed with Herceptin only today. She is doing well this week and is much improved.   #2 she will return next week for cycle 2 of TCH.   All questions were answered. The patient knows to call the clinic with any problems, questions or concerns. We can certainly see the patient much sooner if necessary.  I spent 25 minutes counseling the patient face to face. The total time spent in the appointment was 30 minutes.  Cherie Ouch Lyn Hollingshead, NP Medical Oncology [facility] Phone: [phone]

## 2013-02-09 NOTE — Patient Instructions (Addendum)
Doing well.  Proceed with Herceptin.  We will see you back next week for you chemotherapy.  Please call us if you have any questions or concerns.

## 2013-02-16 ENCOUNTER — Telehealth: Payer: Self-pay | Admitting: Oncology

## 2013-02-16 ENCOUNTER — Ambulatory Visit (HOSPITAL_BASED_OUTPATIENT_CLINIC_OR_DEPARTMENT_OTHER): Payer: BC Managed Care – PPO

## 2013-02-16 ENCOUNTER — Other Ambulatory Visit (HOSPITAL_BASED_OUTPATIENT_CLINIC_OR_DEPARTMENT_OTHER): Payer: BC Managed Care – PPO | Admitting: Lab

## 2013-02-16 ENCOUNTER — Other Ambulatory Visit: Payer: BC Managed Care – PPO | Admitting: Lab

## 2013-02-16 ENCOUNTER — Ambulatory Visit (HOSPITAL_BASED_OUTPATIENT_CLINIC_OR_DEPARTMENT_OTHER): Payer: BC Managed Care – PPO | Admitting: Adult Health

## 2013-02-16 ENCOUNTER — Encounter: Payer: Self-pay | Admitting: Adult Health

## 2013-02-16 ENCOUNTER — Ambulatory Visit: Payer: BC Managed Care – PPO | Admitting: Adult Health

## 2013-02-16 VITALS — BP 145/84 | HR 76 | Temp 98.6°F | Resp 20 | Ht 66.0 in | Wt 233.1 lb

## 2013-02-16 DIAGNOSIS — C50419 Malignant neoplasm of upper-outer quadrant of unspecified female breast: Secondary | ICD-10-CM

## 2013-02-16 DIAGNOSIS — Z5111 Encounter for antineoplastic chemotherapy: Secondary | ICD-10-CM

## 2013-02-16 DIAGNOSIS — Z171 Estrogen receptor negative status [ER-]: Secondary | ICD-10-CM

## 2013-02-16 DIAGNOSIS — C50411 Malignant neoplasm of upper-outer quadrant of right female breast: Secondary | ICD-10-CM

## 2013-02-16 DIAGNOSIS — Z5112 Encounter for antineoplastic immunotherapy: Secondary | ICD-10-CM

## 2013-02-16 LAB — COMPREHENSIVE METABOLIC PANEL (CC13)
ALT: 32 U/L (ref 0–55)
Albumin: 3.6 g/dL (ref 3.5–5.0)
Alkaline Phosphatase: 97 U/L (ref 40–150)
CO2: 23 mEq/L (ref 22–29)
Glucose: 211 mg/dl — ABNORMAL HIGH (ref 70–99)
Potassium: 4 mEq/L (ref 3.5–5.1)
Sodium: 140 mEq/L (ref 136–145)
Total Protein: 6.7 g/dL (ref 6.4–8.3)

## 2013-02-16 LAB — CBC WITH DIFFERENTIAL/PLATELET
Basophils Absolute: 0 10*3/uL (ref 0.0–0.1)
Eosinophils Absolute: 0 10*3/uL (ref 0.0–0.5)
HGB: 9.7 g/dL — ABNORMAL LOW (ref 11.6–15.9)
NEUT#: 10.7 10*3/uL — ABNORMAL HIGH (ref 1.5–6.5)
RBC: 3.83 10*6/uL (ref 3.70–5.45)
RDW: 14 % (ref 11.2–14.5)
WBC: 12.6 10*3/uL — ABNORMAL HIGH (ref 3.9–10.3)
lymph#: 1.5 10*3/uL (ref 0.9–3.3)
nRBC: 0 % (ref 0–0)

## 2013-02-16 MED ORDER — SODIUM CHLORIDE 0.9 % IV SOLN
Freq: Once | INTRAVENOUS | Status: AC
  Administered 2013-02-16: 12:00:00 via INTRAVENOUS

## 2013-02-16 MED ORDER — ACETAMINOPHEN 325 MG PO TABS
650.0000 mg | ORAL_TABLET | Freq: Once | ORAL | Status: AC
  Administered 2013-02-16: 650 mg via ORAL

## 2013-02-16 MED ORDER — DEXAMETHASONE SODIUM PHOSPHATE 4 MG/ML IJ SOLN
20.0000 mg | Freq: Once | INTRAMUSCULAR | Status: AC
  Administered 2013-02-16: 20 mg via INTRAVENOUS

## 2013-02-16 MED ORDER — SODIUM CHLORIDE 0.9 % IV SOLN
75.0000 mg/m2 | Freq: Once | INTRAVENOUS | Status: AC
  Administered 2013-02-16: 170 mg via INTRAVENOUS
  Filled 2013-02-16: qty 17

## 2013-02-16 MED ORDER — ONDANSETRON 16 MG/50ML IVPB (CHCC)
16.0000 mg | Freq: Once | INTRAVENOUS | Status: AC
  Administered 2013-02-16: 16 mg via INTRAVENOUS

## 2013-02-16 MED ORDER — SODIUM CHLORIDE 0.9 % IV SOLN
900.0000 mg | Freq: Once | INTRAVENOUS | Status: AC
  Administered 2013-02-16: 900 mg via INTRAVENOUS
  Filled 2013-02-16: qty 90

## 2013-02-16 MED ORDER — DIPHENHYDRAMINE HCL 25 MG PO CAPS
50.0000 mg | ORAL_CAPSULE | Freq: Once | ORAL | Status: AC
  Administered 2013-02-16: 50 mg via ORAL

## 2013-02-16 MED ORDER — HEPARIN SOD (PORK) LOCK FLUSH 100 UNIT/ML IV SOLN
500.0000 [IU] | Freq: Once | INTRAVENOUS | Status: AC | PRN
  Administered 2013-02-16: 500 [IU]
  Filled 2013-02-16: qty 5

## 2013-02-16 MED ORDER — SODIUM CHLORIDE 0.9 % IJ SOLN
10.0000 mL | INTRAMUSCULAR | Status: DC | PRN
  Administered 2013-02-16: 10 mL
  Filled 2013-02-16: qty 10

## 2013-02-16 MED ORDER — TRASTUZUMAB CHEMO INJECTION 440 MG
2.0000 mg/kg | Freq: Once | INTRAVENOUS | Status: AC
  Administered 2013-02-16: 210 mg via INTRAVENOUS
  Filled 2013-02-16: qty 10

## 2013-02-16 NOTE — Patient Instructions (Addendum)
[facility] Discharge Instructions for Patients Receiving Chemotherapy  Today you received the following chemotherapy agents Taxotere, Carboplatin and Herceptin.  To help prevent nausea and vomiting after your treatment, we encourage you to take your nausea medication as prescribed.   If you develop nausea and vomiting that is not controlled by your nausea medication, call the clinic. If it is after clinic hours your family physician or the after hours number for the clinic or go to the Emergency Department.   BELOW ARE SYMPTOMS THAT SHOULD BE REPORTED IMMEDIATELY:  *FEVER GREATER THAN 100.5 F  *CHILLS WITH OR WITHOUT FEVER  NAUSEA AND VOMITING THAT IS NOT CONTROLLED WITH YOUR NAUSEA MEDICATION  *UNUSUAL SHORTNESS OF BREATH  *UNUSUAL BRUISING OR BLEEDING  TENDERNESS IN MOUTH AND THROAT WITH OR WITHOUT PRESENCE OF ULCERS  *URINARY PROBLEMS  *BOWEL PROBLEMS  UNUSUAL RASH Items with * indicate a potential emergency and should be followed up as soon as possible.  One of the nurses will contact you 24 hours after your treatment. Please let the nurse know about any problems that you may have experienced. Feel free to call the clinic you have any questions or concerns. The clinic phone number is [phone].    I know to contact the clinic, my physician, or go to the Emergency Department if any problems should occur. I do not have any questions at this time, but understand that I may call the clinic during office hours   should I have any questions or need assistance in obtaining follow up care.    __________________________________________  _____________  __________ Signature of Patient or Authorized Representative            Date                   Time    __________________________________________ Nurse's Signature

## 2013-02-16 NOTE — Progress Notes (Signed)
OFFICE PROGRESS NOTE  CC**  DOOLITTLE, Harrel Lemon, MD [address] [place] [address] Dr. Almond Lint Dr. Lurline Hare  DIAGNOSIS: 52 year old female with invasive ductal carcinoma of the right breast diagnosed December 2013.  PRIOR THERAPY:  #1 patient was originally seen in the multidisciplinary breast clinic on 12/17/2012 with a screening mammogram that showed an abnormality in the right breast. This abnormality measured 1.6 cm. She had a biopsy performed that showed invasive ductal carcinoma grade 2, ER negative, PR negative, HER-2 positive with a Ki-67 of 80%.  #2 patient is now status post right lumpectomy on 12/31/2012 with the pathology revealing a 8 cm invasive ductal carcinoma grade 3 with lymphovascular invasion, ER negative PR negative HER-2/neu positive with amplification of 4.36, 2 sentinel nodes were negative for metastatic disease. Patient did have positive margins and she will need a reexcision which will be performed on 01/14/2013. Patient has a Port-A-Cath in place.  #3 patient will proceed with adjuvant chemotherapy initially consisting of Taxotere carboplatinum and Herceptin. With Taxotere carboplatinum to be given every 21 days and Herceptin on a weekly basis for a total of 6 cycles of TC.we will plan on starting her first cycle of chemotherapy on 01/26/2013.  CURRENT THERAPY: TCH cycle 2 day 1 with weekly Herceptin  INTERVAL HISTORY: Julia Zuniga 51 y.o. female returns for evaluation for [facility] chemotherapy cycle 2.  She is feeling well today.  She denies fevers, chills, nausea, vomiting, diarrhea, constipation, numbness or any other concerns.  A 10 point ROS is neg.    MEDICAL HISTORY: Past Medical History  Diagnosis Date  . Breast cancer   . Hyperlipidemia   . Diverticulitis   . Hernia, hiatal   . Heartburn   . Anxiety   . Hot flashes   . PONV (postoperative nausea and vomiting)     ALLERGIES:  is allergic to codeine; medralone; and  oxycodone-acetaminophen.  MEDICATIONS:  Current Outpatient Prescriptions  Medication Sig Dispense Refill  . atorvastatin (LIPITOR) 40 MG tablet Take 40 mg by mouth every evening.      . carvedilol (COREG) 6.25 MG tablet Take 1 tablet (6.25 mg total) by mouth 2 (two) times daily with a meal.  60 tablet  6  . dexamethasone (DECADRON) 4 MG tablet Take 2 tablets (8 mg total) by mouth 2 (two) times daily with a meal. Take two times a day the day before Taxotere. Then take two times a day starting the day after chemo for 3 days.  30 tablet  1  . lidocaine-prilocaine (EMLA) cream Apply topically as needed.  30 g  8  . LORazepam (ATIVAN) 0.5 MG tablet Take 1 tablet (0.5 mg total) by mouth every 6 (six) hours as needed (Nausea or vomiting).  30 tablet  0  . omeprazole (PRILOSEC) 20 MG capsule Take 20 mg by mouth daily.      . ondansetron (ZOFRAN) 8 MG tablet Take 1 tablet (8 mg total) by mouth 2 (two) times daily. Take two times a day starting the day after chemo for 3 days. Then take two times a day as needed for nausea or vomiting.  30 tablet  1  . Probiotic Product (PROBIOTIC DAILY PO) Take 1 tablet by mouth daily.       . prochlorperazine (COMPAZINE) 10 MG tablet Take 1 tablet (10 mg total) by mouth every 6 (six) hours as needed (Nausea or vomiting).  30 tablet  1  . prochlorperazine (COMPAZINE) 25 MG suppository Place 1 suppository (25  mg total) rectally every 12 (twelve) hours as needed for nausea.  12 suppository  3  . UNABLE TO FIND Cranial prosthesis due to chemotherapy induced hair loss.  1 Units  0  . HYDROcodone-acetaminophen (NORCO) 5-325 MG per tablet Take 1-2 tablets by mouth every 6 (six) hours as needed for pain.  15 tablet  1  . ibuprofen (ADVIL,MOTRIN) 200 MG tablet Take 400 mg by mouth every 6 (six) hours as needed. Pain      . nystatin (MYCOSTATIN) 100000 UNIT/ML suspension Take 5 mLs (500,000 Units total) by mouth 4 (four) times daily.  60 mL  0   No current facility-administered  medications for this visit.    SURGICAL HISTORY:  Past Surgical History  Procedure Laterality Date  . Cesarean section      x 3  . Tubal ligation    . Tonsillectomy and adenoidectomy    . Cholecystectomy  2001    lap choli  . Breast lumpectomy with sentinel lymph node biopsy  12/31/2012    Procedure: BREAST LUMPECTOMY WITH SENTINEL LYMPH NODE BX;  Surgeon: Almond Lint, MD;  Location: [facility];  Service: General;  Laterality: Right;  . Portacath placement  12/31/2012    Procedure: INSERTION PORT-A-CATH;  Surgeon: Almond Lint, MD;  Location: [facility];  Service: General;  Laterality: Left;  Left Subclavian Vein  . Re-excision of breast cancer,superior margins  01/14/2013    Procedure: RE-EXCISION OF BREAST CANCER,SUPERIOR MARGINS;  Surgeon: Almond Lint, MD;  Location: [facility];  Service: General;  Laterality: Right;  right breast re excision for markers     REVIEW OF SYSTEMS:   General: fatigue (-), night sweats (-), fever (-), pain (-) Lymph: palpable nodes (-) HEENT: vision changes (-), mucositis (-), gum bleeding (-), epistaxis (-) Cardiovascular: chest pain (-), palpitations (-) Pulmonary: shortness of breath (-), dyspnea on exertion (-), cough (-), hemoptysis (-) GI:  Early satiety (-), melena (-), dysphagia (-), nausea/vomiting (-), diarrhea (-) GU: dysuria (-), hematuria (-), incontinence (-) Musculoskeletal: joint swelling (-), joint pain (-), back pain (-) Neuro: weakness (-), numbness (-), headache (-), confusion (-) Skin: Rash (-), lesions (-), dryness (-) Psych: depression (-), suicidal/homicidal ideation (-), feeling of hopelessness (-)  PHYSICAL EXAMINATION: Blood pressure 145/84, pulse 76, temperature 98.6 F (37 C), temperature source Oral, resp. rate 20, height 5\' 6"  (1.676 m), weight 233 lb 1.6 oz (105.733 kg), last menstrual period 01/07/2009. Body mass index is 37.64 kg/(m^2). General: Patient is a well appearing female in no acute  distress HEENT: PERRLA, sclerae anicteric no conjunctival pallor, MMM, white patches on tongue Neck: supple, no palpable adenopathy Lungs: clear to auscultation bilaterally, no wheezes, rhonchi, or rales Cardiovascular: regular rate rhythm, S1, S2, no murmurs, rubs or gallops Abdomen: Soft, non-tender, non-distended, normoactive bowel sounds, no HSM Extremities: warm and well perfused, no clubbing, cyanosis, or edema Skin: No rashes or lesions Neuro: Non-focal ECOG PERFORMANCE STATUS: 0 - Asymptomatic Right breast lumpectomy scar healed, no nodularity, left breast, no masses or nipple discharge   LABORATORY DATA: Lab Results  Component Value Date   WBC 12.6* 02/16/2013   HGB 9.7* 02/16/2013   HCT 30.8* 02/16/2013   MCV 80.4 02/16/2013   PLT 245 02/16/2013      Chemistry      Component Value Date/Time   NA 143 02/09/2013 0926   NA 142 01/24/2011 2235   K 3.8 02/09/2013 0926   K 3.9 01/24/2011 2235   CL 107 02/09/2013  0926   CL 106 01/24/2011 2235   CO2 26 02/09/2013 0926   CO2 27 01/24/2011 2235   BUN 8.3 02/09/2013 0926   BUN 7 01/24/2011 2235   CREATININE 0.6 02/09/2013 0926   CREATININE 0.56 01/24/2011 2235      Component Value Date/Time   CALCIUM 9.1 02/09/2013 0926   CALCIUM 10.0 01/24/2011 2235   ALKPHOS 99 02/09/2013 0926   AST 13 02/09/2013 0926   ALT 27 02/09/2013 0926   BILITOT 0.36 02/09/2013 0926    ADDITIONAL INFORMATION: 1. PROGNOSTIC INDICATORS - ACIS Results IMMUNOHISTOCHEMICAL AND MORPHOMETRIC ANALYSIS BY THE AUTOMATED CELLULAR IMAGING SYSTEM (ACIS) Estrogen Receptor (Negative, <1%): 0%, NEGATIVE Progesterone Receptor (Negative, <1%): 0%, NEGATIVE COMMENT: The negative hormone receptor study(ies) in this case have an internal positive control. All controls stained appropriately Pecola Leisure MD Pathologist, Electronic Signature ( Signed 01/07/2013) 1. CHROMOGENIC IN-SITU HYBRIDIZATION Interpretation: HER2/NEU BY CISH - SHOWS AMPLIFICATION BY CISH ANALYSIS. THE RATIO OF HER2:  CEP 17 SIGNALS WAS 4.36. Reference range: Ratio: HER2:CEP17 < 1.8 gene amplification not observed Ratio: HER2:CEP 17 1.8-2.2 - equivocal result Ratio: HER2:CEP17 > 2.2 - gene amplification observed 1 of 4 FINAL for VERLYN, DANNENBERG ([id number]) ADDITIONAL INFORMATION:(continued) Pecola Leisure MD Pathologist, Electronic Signature ( Signed 01/07/2013) FINAL DIAGNOSIS Diagnosis 1. Breast, lumpectomy, Right - INVASIVE DUCTAL CARCINOMA, GRADE III (1.8 CM), SEE COMMENT. - LYMPHOVASCULAR INVASION IDENTIFIED. - INVASIVE TUMOR IS 1 MM TO NEAREST MARGIN (POSTERIOR). - DUCTAL CARCINOMA IN SITU, GRADE III WITH COMEDO NECROSIS. - IN SITU CARCINOMA IS LESS THAN 0.1 MM FROM ANTERIOR-INFERIOR AND POSTERIOR MARGINS. - SEE TUMOR TEMPLATE BELOW. 2. Lymph node, sentinel, biopsy, Right axillary - ONE LYMPH NODE, NEGATIVE FOR TUMOR (0/1), SEE COMMENT. 3. Lymph node, sentinel, biopsy, Right axillary - ONE LYMPH NODE, NEGATIVE FOR TUMOR (0/1), SEE COMMENT. Microscopic Comment 1. BREAST, INVASIVE TUMOR, WITH LYMPH NODE SAMPLING Specimen, including laterality: Right breast Procedure: Lumpectomy Grade: III of III Tubule formation: II Nuclear pleomorphism: III Mitotic:III Tumor size (gross measurement): 1.8 cm Margins: Invasive, distance to closest margin: 0.6 cm In-situ, distance to closest margin: less than 0.1 cm If margin positive, focally or broadly: N/A Lymphovascular invasion: Present Ductal carcinoma in situ: Present Grade: III of III Extensive intraductal component: Absent Lobular neoplasia: Absent Tumor focality: Unifocal Treatment effect: None If present, treatment effect in breast tissue, lymph nodes or both: N/A Extent of tumor: Skin: N/A Nipple: N/A Skeletal muscle: N/A Lymph nodes: # examined: 2 Lymph nodes with metastasis: 0 Breast prognostic profile: Estrogen receptor: Repeated, previous study demonstrated 0% positivity ([id number]) 2 of 4 FINAL for CHANNON, BROUGHER  ([id number]) Microscopic Comment(continued) Progesterone receptor: Repeated, previous study demonstrated 0% positivity ([id number]) Her 2 neu: Repeated, previous study demonstrated amplification (3.88) ([id number]) Ki-67: Not repeated, previous study demonstrated 83% proliferation rate ([id number]) Non-neoplastic breast: Previous biopsy site, fibrocystic change and microcalcifications TNM: pT1c pN0 pMX   RADIOGRAPHIC STUDIES:  Mr Breast Bilateral W Wo Contrast  12/23/2012  *RADIOLOGY REPORT*  Clinical Data: recent diagnosis of invasive ductal carcinoma 12 o'clock right breast, for treatment planning  BILATERAL BREAST MRI WITH AND WITHOUT CONTRAST  Technique: Multiplanar, multisequence MR images of both breasts were obtained prior to and following the intravenous administration of 20ml of multihance.  Three dimensional images were evaluated at the independent DynaCad workstation.  Comparison:  mammographic and ultrasound images 12/08/12 and 11/20/12  Findings: There is mild background parenchymal enhancement.  The left breast is negative. There is no evidence of axillary or internal mammary adenopathy.  There is no abnormal skin or nipple enhancement.  On the right, associated with the recently placed biopsy marker clip, there is an enhancing spiculated mass in the 12 o'clock position 11cm from the nipple.  The mass measures 23 x 12mm transverse dimension by 35mm craniocaudal dimension.  1cm anterior and slightly inferior to the mass is a second 7mm mass which is connected to the dominant mass by a thin bridge of enhancement. There are no other abnormalities in the right breast.  IMPRESSION: Biopsy-proven invasive carcinoma in the 12 o'clock position of the right breast, with an additional satellite focus as described above.  BI-RADS CATEGORY 6:  Known biopsy-proven malignancy - appropriate action should be taken.  THREE-DIMENSIONAL MR IMAGE RENDERING ON INDEPENDENT WORKSTATION:  Three-dimensional MR  images were rendered by post-processing of the original MR data on an independent workstation.  The three- dimensional MR images were interpreted, and findings were reported in the accompanying complete MRI report for this study.   Original Report Authenticated By: Esperanza Heir, M.D.    Nm Sentinel Node Inj-no Rpt (breast)  12/31/2012  CLINICAL DATA: right breast cancer   Sulfur colloid was injected intradermally by the nuclear medicine  technologist for breast cancer sentinel node localization.     Dg Chest Port 1 View  12/31/2012  *RADIOLOGY REPORT*  Clinical Data: Port-A-Cath placement.  History of breast cancer.  PORTABLE CHEST - 1 VIEW  Comparison: 03/26/2011.  Findings: Central line has been placed from the left.  The tip is at the level of the distal superior vena cava/caval atrial junction.  No gross pneumothorax.  Consolidation medial aspect of the lung bases may be present possibly representing atelectasis or infiltrate.  Follow-up two- view chest with better inspiration recommended for further delineation.  Heart size top normal.  Mild central pulmonary vascular prominence.  Surgical clips project over the right lower chest and may be related to breast reconstruction/axillary lymph node dissection.  IMPRESSION: Central line has been placed from the left.  The tip is at the level of the distal superior vena cava/caval atrial junction.  No gross pneumothorax.  Consolidation medial aspect of the lung bases may be present possibly representing atelectasis or infiltrate.  Follow-up two- view chest with better inspiration recommended for further delineation.   Original Report Authenticated By: Lacy Duverney, M.D.    Dg Fluoro Guide Cv Line-no Report  12/31/2012  CLINICAL DATA: portacath   FLOURO GUIDE CV LINE  Fluoroscopy was utilized by the requesting physician.  No radiographic  interpretation.      ASSESSMENT: 52 year old female with  #1 new diagnosis of invasive ductal carcinoma of the right  breast she is now status post lumpectomy with sentinel lymph node biopsy with the final pathology revealing a 1.8 cm invasive ductal carcinoma grade 3 ER negative PR negative HER-2/neu amplified at 4.36. 2 sentinel nodes were negative for metastatic disease. There was noted to be lymphovascular invasion. Postoperatively patient is doing well. She will go for a reexcision for positive margin.  #2 she will need adjuvant chemotherapy consisting of Taxotere carboplatinum given every 21 days for a total of 6 cycles along with Herceptin initially given on weekly basis for duration of her chemotherapy. Once patient starts radiation therapy her Herceptin will begin every 3 weeks to finish out one year of therapy.  #3 patient has had chemotherapy teaching class all of her anti-emetics are sent to her pharmacy and she is also going to be seeing in cardiology in the CHF clinic for  prevention of Herceptin related cardiac disease.  PLAN:   #1 Ms. Urda will proceed with [facility] today.  She is doing well. She will continue to take Super B complex daily, her numbness has resolved.   #2 she will return next week for her weekly Herceptin.    All questions were answered. The patient knows to call the clinic with any problems, questions or concerns. We can certainly see the patient much sooner if necessary.  I spent 25 minutes counseling the patient face to face. The total time spent in the appointment was 30 minutes.  Cherie Ouch Lyn Hollingshead, NP [facility] Phone: [phone]

## 2013-02-16 NOTE — Telephone Encounter (Signed)
gv pt appt schedule for March thru June.  °

## 2013-02-16 NOTE — Patient Instructions (Addendum)
Doing well.  Proceed with chemotherapy.  Please call us if you have any questions or concerns.    

## 2013-02-17 ENCOUNTER — Ambulatory Visit (HOSPITAL_BASED_OUTPATIENT_CLINIC_OR_DEPARTMENT_OTHER): Payer: BC Managed Care – PPO

## 2013-02-17 VITALS — BP 147/81 | HR 81 | Temp 98.4°F

## 2013-02-17 DIAGNOSIS — C50419 Malignant neoplasm of upper-outer quadrant of unspecified female breast: Secondary | ICD-10-CM

## 2013-02-17 DIAGNOSIS — Z5189 Encounter for other specified aftercare: Secondary | ICD-10-CM

## 2013-02-17 MED ORDER — PEGFILGRASTIM INJECTION 6 MG/0.6ML
6.0000 mg | Freq: Once | SUBCUTANEOUS | Status: AC
  Administered 2013-02-17: 6 mg via SUBCUTANEOUS
  Filled 2013-02-17: qty 0.6

## 2013-02-23 ENCOUNTER — Ambulatory Visit (HOSPITAL_BASED_OUTPATIENT_CLINIC_OR_DEPARTMENT_OTHER): Payer: BC Managed Care – PPO

## 2013-02-23 ENCOUNTER — Ambulatory Visit (HOSPITAL_BASED_OUTPATIENT_CLINIC_OR_DEPARTMENT_OTHER): Payer: BC Managed Care – PPO | Admitting: Adult Health

## 2013-02-23 ENCOUNTER — Telehealth: Payer: Self-pay | Admitting: Oncology

## 2013-02-23 ENCOUNTER — Other Ambulatory Visit (HOSPITAL_BASED_OUTPATIENT_CLINIC_OR_DEPARTMENT_OTHER): Payer: BC Managed Care – PPO | Admitting: Lab

## 2013-02-23 ENCOUNTER — Encounter: Payer: Self-pay | Admitting: Adult Health

## 2013-02-23 VITALS — BP 119/83 | HR 96 | Temp 98.5°F | Resp 20 | Ht 66.0 in | Wt 223.5 lb

## 2013-02-23 DIAGNOSIS — IMO0001 Reserved for inherently not codable concepts without codable children: Secondary | ICD-10-CM

## 2013-02-23 DIAGNOSIS — Z5111 Encounter for antineoplastic chemotherapy: Secondary | ICD-10-CM

## 2013-02-23 DIAGNOSIS — B370 Candidal stomatitis: Secondary | ICD-10-CM

## 2013-02-23 DIAGNOSIS — C50419 Malignant neoplasm of upper-outer quadrant of unspecified female breast: Secondary | ICD-10-CM

## 2013-02-23 DIAGNOSIS — R232 Flushing: Secondary | ICD-10-CM

## 2013-02-23 DIAGNOSIS — C50411 Malignant neoplasm of upper-outer quadrant of right female breast: Secondary | ICD-10-CM

## 2013-02-23 DIAGNOSIS — K1231 Oral mucositis (ulcerative) due to antineoplastic therapy: Secondary | ICD-10-CM

## 2013-02-23 DIAGNOSIS — K219 Gastro-esophageal reflux disease without esophagitis: Secondary | ICD-10-CM

## 2013-02-23 DIAGNOSIS — E860 Dehydration: Secondary | ICD-10-CM

## 2013-02-23 LAB — CBC WITH DIFFERENTIAL/PLATELET
BASO%: 0.1 % (ref 0.0–2.0)
Basophils Absolute: 0 10*3/uL (ref 0.0–0.1)
HCT: 35 % (ref 34.8–46.6)
HGB: 11.4 g/dL — ABNORMAL LOW (ref 11.6–15.9)
LYMPH%: 38.8 % (ref 14.0–49.7)
MCH: 25.8 pg (ref 25.1–34.0)
MCHC: 32.6 g/dL (ref 31.5–36.0)
MONO#: 1.3 10*3/uL — ABNORMAL HIGH (ref 0.1–0.9)
NEUT%: 40.2 % (ref 38.4–76.8)
Platelets: 117 10*3/uL — ABNORMAL LOW (ref 145–400)
WBC: 6.9 10*3/uL (ref 3.9–10.3)
lymph#: 2.7 10*3/uL (ref 0.9–3.3)

## 2013-02-23 LAB — COMPREHENSIVE METABOLIC PANEL (CC13)
ALT: 37 U/L (ref 0–55)
AST: 17 U/L (ref 5–34)
CO2: 28 mEq/L (ref 22–29)
Calcium: 10 mg/dL (ref 8.4–10.4)
Chloride: 97 mEq/L — ABNORMAL LOW (ref 98–107)
Sodium: 136 mEq/L (ref 136–145)
Total Bilirubin: 0.42 mg/dL (ref 0.20–1.20)
Total Protein: 7.4 g/dL (ref 6.4–8.3)

## 2013-02-23 MED ORDER — DIPHENHYDRAMINE HCL 25 MG PO CAPS
50.0000 mg | ORAL_CAPSULE | Freq: Once | ORAL | Status: AC
  Administered 2013-02-23: 50 mg via ORAL

## 2013-02-23 MED ORDER — TRASTUZUMAB CHEMO INJECTION 440 MG
2.0000 mg/kg | Freq: Once | INTRAVENOUS | Status: AC
  Administered 2013-02-23: 210 mg via INTRAVENOUS
  Filled 2013-02-23: qty 10

## 2013-02-23 MED ORDER — NYSTATIN 100000 UNIT/ML MT SUSP: 500000.0000 [IU] | Freq: Four times a day (QID) | OROMUCOSAL | Status: DC

## 2013-02-23 MED ORDER — SODIUM CHLORIDE 0.9 % IV SOLN
Freq: Once | INTRAVENOUS | Status: DC
  Administered 2013-02-23: 11:00:00 via INTRAVENOUS

## 2013-02-23 MED ORDER — ACETAMINOPHEN 325 MG PO TABS
650.0000 mg | ORAL_TABLET | Freq: Once | ORAL | Status: AC
  Administered 2013-02-23: 650 mg via ORAL

## 2013-02-23 MED ORDER — SODIUM CHLORIDE 0.9 % IJ SOLN
10.0000 mL | INTRAMUSCULAR | Status: DC | PRN
  Administered 2013-02-23: 10 mL
  Filled 2013-02-23: qty 10

## 2013-02-23 MED ORDER — OMEPRAZOLE 40 MG PO CPDR: 40.0000 mg | DELAYED_RELEASE_CAPSULE | Freq: Every day | ORAL | Status: DC

## 2013-02-23 MED ORDER — HEPARIN SOD (PORK) LOCK FLUSH 100 UNIT/ML IV SOLN
500.0000 [IU] | Freq: Once | INTRAVENOUS | Status: AC | PRN
  Administered 2013-02-23: 500 [IU]
  Filled 2013-02-23: qty 5

## 2013-02-23 NOTE — Progress Notes (Signed)
OFFICE PROGRESS NOTE  CC**  DOOLITTLE, Harrel Lemon, MD [address] [place] [address] Dr. Almond Lint Dr. Lurline Hare  DIAGNOSIS: 52 year old female with invasive ductal carcinoma of the right breast diagnosed December 2013.  PRIOR THERAPY:  #1 patient was originally seen in the multidisciplinary breast clinic on 12/17/2012 with a screening mammogram that showed an abnormality in the right breast. This abnormality measured 1.6 cm. She had a biopsy performed that showed invasive ductal carcinoma grade 2, ER negative, PR negative, HER-2 positive with a Ki-67 of 80%.  #2 patient is now status post right lumpectomy on 12/31/2012 with the pathology revealing a 8 cm invasive ductal carcinoma grade 3 with lymphovascular invasion, ER negative PR negative HER-2/neu positive with amplification of 4.36, 2 sentinel nodes were negative for metastatic disease. Patient did have positive margins and she will need a reexcision which will be performed on 01/14/2013. Patient has a Port-A-Cath in place.  #3 patient will proceed with adjuvant chemotherapy initially consisting of Taxotere carboplatinum and Herceptin. With Taxotere carboplatinum to be given every 21 days and Herceptin on a weekly basis for a total of 6 cycles of TC.we will plan on starting her first cycle of chemotherapy on 01/26/2013.  CURRENT THERAPY: TCH cycle 2 day 8 with weekly Herceptin  INTERVAL HISTORY: JAMIYAH DINGLEY 52 y.o. female returns for evaluation after receiving her second cycle of TCH with neulasta support.  She began crying as soon as I walked into the exam room.  She feels miserable and doesn't want any more chemotherapy.  She's very fatigued.  Has reflux, was nauseated x 5 days, had some diarrhea that imodium resolved, but later got the urge to have a BM and couldn't because her body couldn't get it out.  She is having hot flahses, and feels like she's dying on the inside.  She continues to have mild numbness in her  fingertips and toes.  Her mouth is in pain, she's taking nystatin as prescribed and its not working like it did the last time.  She's taking Super B complex for this and it remains stable and hasn't progressed.  Today she was nauseated and took Zofran and it relieved her nausea.  Her last bowel movement was yesterday.  She denies fevers, chills, or any further concerns.    MEDICAL HISTORY: Past Medical History  Diagnosis Date  . Breast cancer   . Hyperlipidemia   . Diverticulitis   . Hernia, hiatal   . Heartburn   . Anxiety   . Hot flashes   . PONV (postoperative nausea and vomiting)     ALLERGIES:  is allergic to codeine; medralone; and oxycodone-acetaminophen.  MEDICATIONS:  Current Outpatient Prescriptions  Medication Sig Dispense Refill  . atorvastatin (LIPITOR) 40 MG tablet Take 40 mg by mouth every evening.      . B Complex-C (SUPER B COMPLEX PO) Take 1 tablet by mouth daily.      . carvedilol (COREG) 6.25 MG tablet Take 1 tablet (6.25 mg total) by mouth 2 (two) times daily with a meal.  60 tablet  6  . dexamethasone (DECADRON) 4 MG tablet Take 2 tablets (8 mg total) by mouth 2 (two) times daily with a meal. Take two times a day the day before Taxotere. Then take two times a day starting the day after chemo for 3 days.  30 tablet  1  . ibuprofen (ADVIL,MOTRIN) 200 MG tablet Take 400 mg by mouth every 6 (six) hours as needed. Pain      .  lidocaine-prilocaine (EMLA) cream Apply topically as needed.  30 g  8  . loperamide (IMODIUM) 2 MG capsule Take 2 mg by mouth 4 (four) times daily as needed for diarrhea or loose stools.      . nystatin (MYCOSTATIN) 100000 UNIT/ML suspension Take 5 mLs (500,000 Units total) by mouth 4 (four) times daily.  60 mL  0  . ondansetron (ZOFRAN) 8 MG tablet Take 1 tablet (8 mg total) by mouth 2 (two) times daily. Take two times a day starting the day after chemo for 3 days. Then take two times a day as needed for nausea or vomiting.  30 tablet  1  .  ranitidine (ZANTAC) 150 MG tablet Take 150 mg by mouth 2 (two) times daily.      Marland Kitchen UNABLE TO FIND Cranial prosthesis due to chemotherapy induced hair loss.  1 Units  0  . HYDROcodone-acetaminophen (NORCO) 5-325 MG per tablet Take 1-2 tablets by mouth every 6 (six) hours as needed for pain.  15 tablet  1  . LORazepam (ATIVAN) 0.5 MG tablet Take 1 tablet (0.5 mg total) by mouth every 6 (six) hours as needed (Nausea or vomiting).  30 tablet  0  . omeprazole (PRILOSEC) 40 MG capsule Take 1 capsule (40 mg total) by mouth daily.  90 capsule  2  . Probiotic Product (PROBIOTIC DAILY PO) Take 1 tablet by mouth daily.       . prochlorperazine (COMPAZINE) 10 MG tablet Take 1 tablet (10 mg total) by mouth every 6 (six) hours as needed (Nausea or vomiting).  30 tablet  1  . prochlorperazine (COMPAZINE) 25 MG suppository Place 1 suppository (25 mg total) rectally every 12 (twelve) hours as needed for nausea.  12 suppository  3   Current Facility-Administered Medications  Medication Dose Route Frequency Provider Last Rate Last Dose  . 0.9 %  sodium chloride infusion   Intravenous Once Augustin Schooling, NP        SURGICAL HISTORY:  Past Surgical History  Procedure Laterality Date  . Cesarean section      x 3  . Tubal ligation    . Tonsillectomy and adenoidectomy    . Cholecystectomy  2001    lap choli  . Breast lumpectomy with sentinel lymph node biopsy  12/31/2012    Procedure: BREAST LUMPECTOMY WITH SENTINEL LYMPH NODE BX;  Surgeon: Almond Lint, MD;  Location: [facility];  Service: General;  Laterality: Right;  . Portacath placement  12/31/2012    Procedure: INSERTION PORT-A-CATH;  Surgeon: Almond Lint, MD;  Location: [facility];  Service: General;  Laterality: Left;  Left Subclavian Vein  . Re-excision of breast cancer,superior margins  01/14/2013    Procedure: RE-EXCISION OF BREAST CANCER,SUPERIOR MARGINS;  Surgeon: Almond Lint, MD;  Location: [facility];  Service: General;   Laterality: Right;  right breast re excision for markers     REVIEW OF SYSTEMS:   General: fatigue (+), night sweats (-), fever (-), pain (-) Lymph: palpable nodes (-) HEENT: vision changes (-), mucositis (+), gum bleeding (-), epistaxis (-) Cardiovascular: chest pain (-), palpitations (-) Pulmonary: shortness of breath (-), dyspnea on exertion (-), cough (-), hemoptysis (-) GI:  Early satiety (-), melena (-), dysphagia (-), nausea/vomiting (+), diarrhea (+) GU: dysuria (-), hematuria (-), incontinence (-) Musculoskeletal: joint swelling (-), joint pain (-), back pain (-) Neuro: weakness (-), numbness (+), headache (-), confusion (-) Skin: Rash (-), lesions (-), dryness (-) Psych: depression (-), suicidal/homicidal ideation (-),  feeling of hopelessness (-)  PHYSICAL EXAMINATION: Blood pressure 119/83, pulse 96, temperature 98.5 F (36.9 C), temperature source Oral, resp. rate 20, height 5\' 6"  (1.676 m), weight 223 lb 8 oz (101.379 kg), last menstrual period 01/07/2009. Body mass index is 36.09 kg/(m^2). General: Patient is a well appearing female in no acute distress HEENT: PERRLA, sclerae anicteric no conjunctival pallor, MMM, erythema and ulcerations on tongue, no white patches Neck: supple, no palpable adenopathy Lungs: clear to auscultation bilaterally, no wheezes, rhonchi, or rales Cardiovascular: regular rate rhythm, S1, S2, no murmurs, rubs or gallops Abdomen: Soft, non-tender, non-distended, normoactive bowel sounds, no HSM Extremities: warm and well perfused, no clubbing, cyanosis, or edema Skin: No rashes or lesions Neuro: Non-focal ECOG PERFORMANCE STATUS: 0 - Asymptomatic Right breast lumpectomy scar healed, no nodularity, left breast, no masses or nipple discharge   LABORATORY DATA: Lab Results  Component Value Date   WBC 6.9 02/23/2013   HGB 11.4* 02/23/2013   HCT 35.0 02/23/2013   MCV 79.2* 02/23/2013   PLT 117* 02/23/2013      Chemistry      Component Value  Date/Time   NA 140 02/16/2013 0949   NA 142 01/24/2011 2235   K 4.0 02/16/2013 0949   K 3.9 01/24/2011 2235   CL 109* 02/16/2013 0949   CL 106 01/24/2011 2235   CO2 23 02/16/2013 0949   CO2 27 01/24/2011 2235   BUN 12.8 02/16/2013 0949   BUN 7 01/24/2011 2235   CREATININE 0.7 02/16/2013 0949   CREATININE 0.56 01/24/2011 2235      Component Value Date/Time   CALCIUM 9.2 02/16/2013 0949   CALCIUM 10.0 01/24/2011 2235   ALKPHOS 97 02/16/2013 0949   AST 10 02/16/2013 0949   ALT 32 02/16/2013 0949   BILITOT 0.29 02/16/2013 0949    ADDITIONAL INFORMATION: 1. PROGNOSTIC INDICATORS - ACIS Results IMMUNOHISTOCHEMICAL AND MORPHOMETRIC ANALYSIS BY THE AUTOMATED CELLULAR IMAGING SYSTEM (ACIS) Estrogen Receptor (Negative, <1%): 0%, NEGATIVE Progesterone Receptor (Negative, <1%): 0%, NEGATIVE COMMENT: The negative hormone receptor study(ies) in this case have an internal positive control. All controls stained appropriately Pecola Leisure MD Pathologist, Electronic Signature ( Signed 01/07/2013) 1. CHROMOGENIC IN-SITU HYBRIDIZATION Interpretation: HER2/NEU BY CISH - SHOWS AMPLIFICATION BY CISH ANALYSIS. THE RATIO OF HER2: CEP 17 SIGNALS WAS 4.36. Reference range: Ratio: HER2:CEP17 < 1.8 gene amplification not observed Ratio: HER2:CEP 17 1.8-2.2 - equivocal result Ratio: HER2:CEP17 > 2.2 - gene amplification observed 1 of 4 FINAL for CHEMIKA, NIGHTENGALE ([id number]) ADDITIONAL INFORMATION:(continued) Pecola Leisure MD Pathologist, Electronic Signature ( Signed 01/07/2013) FINAL DIAGNOSIS Diagnosis 1. Breast, lumpectomy, Right - INVASIVE DUCTAL CARCINOMA, GRADE III (1.8 CM), SEE COMMENT. - LYMPHOVASCULAR INVASION IDENTIFIED. - INVASIVE TUMOR IS 1 MM TO NEAREST MARGIN (POSTERIOR). - DUCTAL CARCINOMA IN SITU, GRADE III WITH COMEDO NECROSIS. - IN SITU CARCINOMA IS LESS THAN 0.1 MM FROM ANTERIOR-INFERIOR AND POSTERIOR MARGINS. - SEE TUMOR TEMPLATE BELOW. 2. Lymph node, sentinel, biopsy, Right axillary -  ONE LYMPH NODE, NEGATIVE FOR TUMOR (0/1), SEE COMMENT. 3. Lymph node, sentinel, biopsy, Right axillary - ONE LYMPH NODE, NEGATIVE FOR TUMOR (0/1), SEE COMMENT. Microscopic Comment 1. BREAST, INVASIVE TUMOR, WITH LYMPH NODE SAMPLING Specimen, including laterality: Right breast Procedure: Lumpectomy Grade: III of III Tubule formation: II Nuclear pleomorphism: III Mitotic:III Tumor size (gross measurement): 1.8 cm Margins: Invasive, distance to closest margin: 0.6 cm In-situ, distance to closest margin: less than 0.1 cm If margin positive, focally or broadly: N/A Lymphovascular invasion: Present Ductal carcinoma in situ: Present  Grade: III of III Extensive intraductal component: Absent Lobular neoplasia: Absent Tumor focality: Unifocal Treatment effect: None If present, treatment effect in breast tissue, lymph nodes or both: N/A Extent of tumor: Skin: N/A Nipple: N/A Skeletal muscle: N/A Lymph nodes: # examined: 2 Lymph nodes with metastasis: 0 Breast prognostic profile: Estrogen receptor: Repeated, previous study demonstrated 0% positivity ([id number]) 2 of 4 FINAL for KENYATTA, GLOECKNER ([id number]) Microscopic Comment(continued) Progesterone receptor: Repeated, previous study demonstrated 0% positivity ([id number]) Her 2 neu: Repeated, previous study demonstrated amplification (3.88) ([id number]) Ki-67: Not repeated, previous study demonstrated 83% proliferation rate ([id number]) Non-neoplastic breast: Previous biopsy site, fibrocystic change and microcalcifications TNM: pT1c pN0 pMX   RADIOGRAPHIC STUDIES:  Mr Breast Bilateral W Wo Contrast  12/23/2012  *RADIOLOGY REPORT*  Clinical Data: recent diagnosis of invasive ductal carcinoma 12 o'clock right breast, for treatment planning  BILATERAL BREAST MRI WITH AND WITHOUT CONTRAST  Technique: Multiplanar, multisequence MR images of both breasts were obtained prior to and following the intravenous administration of 20ml of  multihance.  Three dimensional images were evaluated at the independent DynaCad workstation.  Comparison:  mammographic and ultrasound images 12/08/12 and 11/20/12  Findings: There is mild background parenchymal enhancement.  The left breast is negative. There is no evidence of axillary or internal mammary adenopathy.  There is no abnormal skin or nipple enhancement.  On the right, associated with the recently placed biopsy marker clip, there is an enhancing spiculated mass in the 12 o'clock position 11cm from the nipple.  The mass measures 23 x 12mm transverse dimension by 35mm craniocaudal dimension.  1cm anterior and slightly inferior to the mass is a second 7mm mass which is connected to the dominant mass by a thin bridge of enhancement. There are no other abnormalities in the right breast.  IMPRESSION: Biopsy-proven invasive carcinoma in the 12 o'clock position of the right breast, with an additional satellite focus as described above.  BI-RADS CATEGORY 6:  Known biopsy-proven malignancy - appropriate action should be taken.  THREE-DIMENSIONAL MR IMAGE RENDERING ON INDEPENDENT WORKSTATION:  Three-dimensional MR images were rendered by post-processing of the original MR data on an independent workstation.  The three- dimensional MR images were interpreted, and findings were reported in the accompanying complete MRI report for this study.   Original Report Authenticated By: Esperanza Heir, M.D.    Nm Sentinel Node Inj-no Rpt (breast)  12/31/2012  CLINICAL DATA: right breast cancer   Sulfur colloid was injected intradermally by the nuclear medicine  technologist for breast cancer sentinel node localization.     Dg Chest Port 1 View  12/31/2012  *RADIOLOGY REPORT*  Clinical Data: Port-A-Cath placement.  History of breast cancer.  PORTABLE CHEST - 1 VIEW  Comparison: 03/26/2011.  Findings: Central line has been placed from the left.  The tip is at the level of the distal superior vena cava/caval atrial  junction.  No gross pneumothorax.  Consolidation medial aspect of the lung bases may be present possibly representing atelectasis or infiltrate.  Follow-up two- view chest with better inspiration recommended for further delineation.  Heart size top normal.  Mild central pulmonary vascular prominence.  Surgical clips project over the right lower chest and may be related to breast reconstruction/axillary lymph node dissection.  IMPRESSION: Central line has been placed from the left.  The tip is at the level of the distal superior vena cava/caval atrial junction.  No gross pneumothorax.  Consolidation medial aspect of the lung bases may be present possibly representing atelectasis or infiltrate.  Follow-up two- view chest with better inspiration recommended for further delineation.   Original Report Authenticated By: Lacy Duverney, M.D.    Dg Fluoro Guide Cv Line-no Report  12/31/2012  CLINICAL DATA: portacath   FLOURO GUIDE CV LINE  Fluoroscopy was utilized by the requesting physician.  No radiographic  interpretation.      ASSESSMENT: 52 year old female with  #1 new diagnosis of invasive ductal carcinoma of the right breast she is now status post lumpectomy with sentinel lymph node biopsy with the final pathology revealing a 1.8 cm invasive ductal carcinoma grade 3 ER negative PR negative HER-2/neu amplified at 4.36. 2 sentinel nodes were negative for metastatic disease. There was noted to be lymphovascular invasion. Postoperatively patient is doing well. She will go for a reexcision for positive margin.  #2 she will need adjuvant chemotherapy consisting of Taxotere carboplatinum given every 21 days for a total of 6 cycles along with Herceptin initially given on weekly basis for duration of her chemotherapy. Once patient starts radiation therapy her Herceptin will begin every 3 weeks to finish out one year of therapy.  #3 patient has had chemotherapy teaching class all of her anti-emetics are sent to her  pharmacy and she is also going to be seeing in cardiology in the CHF clinic for prevention of Herceptin related cardiac disease.  #4 Mucositis  #5 Reflux  PLAN:   #1 Ms. Selner is very upset today.  I prescribed magic mouthwash for her mucositis.  We will also give her IV fluids today.  I increased her Omeprazole from 20mg  to 40mg , and she will take it first thing in the morning before anything else.  I offered reassurance.  Her labwork looks good.  She requested to see Dr. Welton Flakes next week to talk about other treatments.  I have sent that appointment request to the schedulers.    #2 she will return next week for her weekly Herceptin.    All questions were answered. The patient knows to call the clinic with any problems, questions or concerns. We can certainly see the patient much sooner if necessary.  I spent 25 minutes counseling the patient face to face. The total time spent in the appointment was 30 minutes.  Cherie Ouch Lyn Hollingshead, NP Medical Oncology [facility] Phone: [phone]

## 2013-02-23 NOTE — Patient Instructions (Addendum)
Doing well.  Proceed with Herceptin.  I have prescribed magic mouthwash for you to use four times a day.  I also prescribed Omeprazole, 40mg  for you to take first thing in the morning on an empty stomach.  Please call us if you have any questions or concerns.

## 2013-02-23 NOTE — Telephone Encounter (Signed)
Moved 3/24 appt from [facility] to [facility] per 3/17 pof. gv pt new schedule.

## 2013-02-23 NOTE — Patient Instructions (Addendum)
[facility] Discharge Instructions for Patients Receiving Chemotherapy  Today you received the following chemotherapy agents Herceptin To help prevent nausea and vomiting after your treatment, we encourage you to take your nausea medication as prescribed.If you develop nausea and vomiting that is not controlled by your nausea medication, call the clinic. If it is after clinic hours your family physician or the after hours number for the clinic or go to the Emergency Department.   BELOW ARE SYMPTOMS THAT SHOULD BE REPORTED IMMEDIATELY:  *FEVER GREATER THAN 100.5 F  *CHILLS WITH OR WITHOUT FEVER  NAUSEA AND VOMITING THAT IS NOT CONTROLLED WITH YOUR NAUSEA MEDICATION  *UNUSUAL SHORTNESS OF BREATH  *UNUSUAL BRUISING OR BLEEDING  TENDERNESS IN MOUTH AND THROAT WITH OR WITHOUT PRESENCE OF ULCERS  *URINARY PROBLEMS  *BOWEL PROBLEMS  UNUSUAL RASH Items with * indicate a potential emergency and should be followed up as soon as possible.  One of the nurses will contact you 24 hours after your treatment. Please let the nurse know about any problems that you may have experienced. Feel free to call the clinic you have any questions or concerns. The clinic phone number is [phone].   I have been informed and understand all the instructions given to me. I know to contact the clinic, my physician, or go to the Emergency Department if any problems should occur. I do not have any questions at this time, but understand that I may call the clinic during office hours   should I have any questions or need assistance in obtaining follow up care.    __________________________________________  _____________  __________ Signature of Patient or Authorized Representative            Date                   Time    __________________________________________ Nurse's Signature     Dehydration, Adult Dehydration is when you lose more fluids from the body than you take in. Vital  organs like the kidneys, brain, and heart cannot function without a proper amount of fluids and salt. Any loss of fluids from the body can cause dehydration.  CAUSES   Vomiting.  Diarrhea.  Excessive sweating.  Excessive urine output.  Fever. SYMPTOMS  Mild dehydration  Thirst.  Dry lips.  Slightly dry mouth. Moderate dehydration  Very dry mouth.  Sunken eyes.  Skin does not bounce back quickly when lightly pinched and released.  Dark urine and decreased urine production.  Decreased tear production.  Headache. Severe dehydration  Very dry mouth.  Extreme thirst.  Rapid, weak pulse (more than 100 beats per minute at rest).  Cold hands and feet.  Not able to sweat in spite of heat and temperature.  Rapid breathing.  Blue lips.  Confusion and lethargy.  Difficulty being awakened.  Minimal urine production.  No tears. DIAGNOSIS  Your caregiver will diagnose dehydration based on your symptoms and your exam. Blood and urine tests will help confirm the diagnosis. The diagnostic evaluation should also identify the cause of dehydration. TREATMENT  Treatment of mild or moderate dehydration can often be done at home by increasing the amount of fluids that you drink. It is best to drink small amounts of fluid more often. Drinking too much at one time can make vomiting worse. Refer to the home care instructions below. Severe dehydration needs to be treated at the hospital where you will probably be given intravenous (IV) fluids that contain water and electrolytes. HOME  CARE INSTRUCTIONS   Ask your caregiver about specific rehydration instructions.  Drink enough fluids to keep your urine clear or pale yellow.  Drink small amounts frequently if you have nausea and vomiting.  Eat as you normally do.  Avoid:  Foods or drinks high in sugar.  Carbonated drinks.  Juice.  Extremely hot or cold fluids.  Drinks with caffeine.  Fatty, greasy  foods.  Alcohol.  Tobacco.  Overeating.  Gelatin desserts.  Wash your hands well to avoid spreading bacteria and viruses.  Only take over-the-counter or prescription medicines for pain, discomfort, or fever as directed by your caregiver.  Ask your caregiver if you should continue all prescribed and over-the-counter medicines.  Keep all follow-up appointments with your caregiver. SEEK MEDICAL CARE IF:  You have abdominal pain and it increases or stays in one area (localizes).  You have a rash, stiff neck, or severe headache.  You are irritable, sleepy, or difficult to awaken.  You are weak, dizzy, or extremely thirsty. SEEK IMMEDIATE MEDICAL CARE IF:   You are unable to keep fluids down or you get worse despite treatment.  You have frequent episodes of vomiting or diarrhea.  You have blood or green matter (bile) in your vomit.  You have blood in your stool or your stool looks black and tarry.  You have not urinated in 6 to 8 hours, or you have only urinated a small amount of very dark urine.  You have a fever.  You faint. MAKE SURE YOU:   Understand these instructions.  Will watch your condition.  Will get help right away if you are not doing well or get worse. Document Released: 11/26/2005 Document Revised: 02/18/2012 Document Reviewed: 07/16/2011 [facility] Patient Information 2013 [place], [place].

## 2013-02-24 ENCOUNTER — Emergency Department (HOSPITAL_COMMUNITY): Payer: BC Managed Care – PPO

## 2013-02-24 ENCOUNTER — Telehealth: Payer: Self-pay | Admitting: *Deleted

## 2013-02-24 ENCOUNTER — Emergency Department (HOSPITAL_COMMUNITY)
Admission: EM | Admit: 2013-02-24 | Discharge: 2013-02-24 | Disposition: A | Discharge status: Home / Self Care | Payer: BC Managed Care – PPO | Attending: Emergency Medicine | Admitting: Emergency Medicine

## 2013-02-24 ENCOUNTER — Telehealth: Payer: Self-pay | Admitting: Medical Oncology

## 2013-02-24 DIAGNOSIS — C50919 Malignant neoplasm of unspecified site of unspecified female breast: Secondary | ICD-10-CM | POA: Insufficient documentation

## 2013-02-24 DIAGNOSIS — F411 Generalized anxiety disorder: Secondary | ICD-10-CM | POA: Insufficient documentation

## 2013-02-24 DIAGNOSIS — Z8719 Personal history of other diseases of the digestive system: Secondary | ICD-10-CM | POA: Insufficient documentation

## 2013-02-24 DIAGNOSIS — G629 Polyneuropathy, unspecified: Secondary | ICD-10-CM

## 2013-02-24 DIAGNOSIS — G589 Mononeuropathy, unspecified: Secondary | ICD-10-CM | POA: Insufficient documentation

## 2013-02-24 DIAGNOSIS — Z79899 Other long term (current) drug therapy: Secondary | ICD-10-CM | POA: Insufficient documentation

## 2013-02-24 DIAGNOSIS — E785 Hyperlipidemia, unspecified: Secondary | ICD-10-CM | POA: Insufficient documentation

## 2013-02-24 LAB — COMPREHENSIVE METABOLIC PANEL
ALT: 30 U/L (ref 0–35)
AST: 18 U/L (ref 0–37)
Alkaline Phosphatase: 113 U/L (ref 39–117)
CO2: 29 mEq/L (ref 19–32)
Calcium: 9.2 mg/dL (ref 8.4–10.5)
Chloride: 97 mEq/L (ref 96–112)
GFR calc Af Amer: >90 mL/min (ref >90)
GFR calc non Af Amer: >90 mL/min (ref >90)
Glucose, Bld: 105 mg/dL — ABNORMAL HIGH (ref 70–99)
Potassium: 3.4 mEq/L — ABNORMAL LOW (ref 3.5–5.1)
Sodium: 135 mEq/L (ref 135–145)

## 2013-02-24 LAB — CBC WITH DIFFERENTIAL/PLATELET
Basophils Relative: 0 % (ref 0–1)
Eosinophils Relative: 0 % (ref 0–5)
HCT: 32.9 % — ABNORMAL LOW (ref 36.0–46.0)
Hemoglobin: 10.5 g/dL — ABNORMAL LOW (ref 12.0–15.0)
Lymphocytes Relative: 25 % (ref 12–46)
MCHC: 31.9 g/dL (ref 30.0–36.0)
Monocytes Relative: 13 % — ABNORMAL HIGH (ref 3–12)
Neutro Abs: 10.5 10*3/uL — ABNORMAL HIGH (ref 1.7–7.7)
Neutrophils Relative %: 62 % (ref 43–77)
RBC: 4.1 MIL/uL (ref 3.87–5.11)
WBC Morphology: INCREASED

## 2013-02-24 LAB — URINE MICROSCOPIC-ADD ON

## 2013-02-24 LAB — URINALYSIS, ROUTINE W REFLEX MICROSCOPIC
Bilirubin Urine: NEGATIVE
Ketones, ur: NEGATIVE mg/dL
Nitrite: NEGATIVE
Specific Gravity, Urine: 1.008 (ref 1.005–1.030)
Urobilinogen, UA: 0.2 mg/dL (ref 0.0–1.0)

## 2013-02-24 LAB — POCT I-STAT TROPONIN I

## 2013-02-24 MED ORDER — GADOBENATE DIMEGLUMINE 529 MG/ML IV SOLN
20.0000 mL | Freq: Once | INTRAVENOUS | Status: AC | PRN
  Administered 2013-02-24: 20 mL via INTRAVENOUS

## 2013-02-24 NOTE — ED Notes (Signed)
Patient is alert and oriented x3.  She was given DC instructions and follow up visit instructions.  Patient gave verbal understanding. She was DC ambulatory under her own power to home.  V/S stable.  He was not showing any signs of distress on DC 

## 2013-02-24 NOTE — ED Provider Notes (Signed)
History     CSN: [id number]  Arrival date & time 02/24/13  1651   First MD Initiated Contact with Patient 02/24/13 1751      Chief Complaint  Patient presents with  . Left Arm Numbness     (Consider location/radiation/quality/duration/timing/severity/associated sxs/prior treatment) HPI Julia Zuniga is a 52 y.o. female who prsents to ED with complaint of numbness to the left arm, hands, feet, right leg, and top of the head. States feels "burning and heavy." Stats she has had this sensation for several weeks in her hands but never in her arm or leg. States is also having on and off blurred vision. Pt with recent diagnosis of breast cancer, is currently on chemo therapy. Had treatment with herceptin yesterday. Pt called her oncologist who told her this could be reaction to the herceptin, but also could be related to a stroke or cardiac source. Pt was instructed to come here.     Past Medical History  Diagnosis Date  . Breast cancer   . Hyperlipidemia   . Diverticulitis   . Hernia, hiatal   . Heartburn   . Anxiety   . Hot flashes   . PONV (postoperative nausea and vomiting)     Past Surgical History  Procedure Laterality Date  . Cesarean section      x 3  . Tubal ligation    . Tonsillectomy and adenoidectomy    . Cholecystectomy  2001    lap choli  . Breast lumpectomy with sentinel lymph node biopsy  12/31/2012    Procedure: BREAST LUMPECTOMY WITH SENTINEL LYMPH NODE BX;  Surgeon: Almond Lint, MD;  Location: [facility];  Service: General;  Laterality: Right;  . Portacath placement  12/31/2012    Procedure: INSERTION PORT-A-CATH;  Surgeon: Almond Lint, MD;  Location: [facility];  Service: General;  Laterality: Left;  Left Subclavian Vein  . Re-excision of breast cancer,superior margins  01/14/2013    Procedure: RE-EXCISION OF BREAST CANCER,SUPERIOR MARGINS;  Surgeon: Almond Lint, MD;  Location: [facility];  Service: General;  Laterality: Right;   right breast re excision for markers     Family History  Problem Relation Age of Onset  . Stomach cancer Father   . Lung cancer Maternal Uncle   . Skin cancer Maternal Grandmother   . Lung cancer Maternal Grandfather   . Lymphoma Cousin     History  Substance Use Topics  . Smoking status: Never Smoker   . Smokeless tobacco: Not on file  . Alcohol Use: 1.2 oz/week    2 Cans of beer per week     Comment: occ    OB History   Grav Para Term Preterm Abortions TAB SAB Ect Mult Living                  Review of Systems  Respiratory: Negative for chest tightness and shortness of breath.   Cardiovascular: Negative for chest pain.  Neurological: Positive for numbness. Negative for weakness.  All other systems reviewed and are negative.    Allergies  Codeine; Medralone; and Oxycodone-acetaminophen  Home Medications   Current Outpatient Rx  Name  Route  Sig  Dispense  Refill  . atorvastatin (LIPITOR) 40 MG tablet   Oral   Take 40 mg by mouth every evening.         . B Complex-C (SUPER B COMPLEX PO)   Oral   Take 1 tablet by mouth daily.         Marland Kitchen  carvedilol (COREG) 6.25 MG tablet   Oral   Take 1 tablet (6.25 mg total) by mouth 2 (two) times daily with a meal.   60 tablet   6   . dexamethasone (DECADRON) 4 MG tablet   Oral   Take 2 tablets (8 mg total) by mouth 2 (two) times daily with a meal. Take two times a day the day before Taxotere. Then take two times a day starting the day after chemo for 3 days.   30 tablet   1   . HYDROcodone-acetaminophen (NORCO) 5-325 MG per tablet   Oral   Take 1-2 tablets by mouth every 6 (six) hours as needed for pain.   15 tablet   1   . ibuprofen (ADVIL,MOTRIN) 200 MG tablet   Oral   Take 400 mg by mouth every 6 (six) hours as needed. Pain         . nystatin (MYCOSTATIN) 100000 UNIT/ML suspension   Oral   Take 5 mLs (500,000 Units total) by mouth 4 (four) times daily.   60 mL   0   . omeprazole (PRILOSEC) 40 MG  capsule   Oral   Take 1 capsule (40 mg total) by mouth daily.   90 capsule   2   . ondansetron (ZOFRAN) 8 MG tablet   Oral   Take 1 tablet (8 mg total) by mouth 2 (two) times daily. Take two times a day starting the day after chemo for 3 days. Then take two times a day as needed for nausea or vomiting.   30 tablet   1   . Probiotic Product (PROBIOTIC DAILY PO)   Oral   Take 1 tablet by mouth daily.          . prochlorperazine (COMPAZINE) 25 MG suppository   Rectal   Place 1 suppository (25 mg total) rectally every 12 (twelve) hours as needed for nausea.   12 suppository   3   . 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   1 Units   0     BP 141/84  Pulse 88  Temp(Src) 98 F (36.7 C) (Oral)  Resp 20  SpO2 97%  LMP 01/07/2009  Physical Exam  Nursing note and vitals reviewed. Constitutional: She is oriented to person, place, and time. She appears well-developed and well-nourished. No distress.  HENT:  Head: Normocephalic.  Eyes: Conjunctivae are normal.  Neck: Neck supple.  Cardiovascular: Normal rate, regular rhythm and normal heart sounds.   Pulmonary/Chest: Effort normal and breath sounds normal. No respiratory distress. She has no wheezes. She has no rales.  Abdominal: Soft. Bowel sounds are normal. She exhibits no distension. There is no tenderness. There is no rebound.  Musculoskeletal: She exhibits no edema.  Normal and equal sensation to bilateral upper and lower extremities for the exception of the plantar surface of feet. Normal two point discrimination of fingertips.   Neurological: She is alert and oriented to person, place, and time.  Skin: Skin is warm and dry.  Psychiatric: She has a normal mood and affect.    ED Course  Procedures (including critical care time)  Results for orders placed during the hospital encounter of 02/24/13  CBC WITH DIFFERENTIAL      Result Value Range   WBC 17.0 (*) 4.0 - 10.5 K/uL    RBC 4.10  3.87 - 5.11 MIL/uL   Hemoglobin 10.5 (*) 12.0 - 15.0 g/dL   HCT  32.9 (*) 36.0 - 46.0 %   MCV 80.2  78.0 - 100.0 fL   MCH 25.6 (*) 26.0 - 34.0 pg   MCHC 31.9  30.0 - 36.0 g/dL   RDW 54.0  98.1 - 19.1 %   Platelets 145 (*) 150 - 400 K/uL   Neutrophils Relative 62  43 - 77 %   Lymphocytes Relative 25  12 - 46 %   Monocytes Relative 13 (*) 3 - 12 %   Eosinophils Relative 0  0 - 5 %   Basophils Relative 0  0 - 1 %   Neutro Abs 10.5 (*) 1.7 - 7.7 K/uL   Lymphs Abs 4.3 (*) 0.7 - 4.0 K/uL   Monocytes Absolute 2.2 (*) 0.1 - 1.0 K/uL   Eosinophils Absolute 0.0  0.0 - 0.7 K/uL   Basophils Absolute 0.0  0.0 - 0.1 K/uL   RBC Morphology RARE NRBCs     WBC Morphology INCREASED BANDS (>20% BANDS)    COMPREHENSIVE METABOLIC PANEL      Result Value Range   Sodium 135  135 - 145 mEq/L   Potassium 3.4 (*) 3.5 - 5.1 mEq/L   Chloride 97  96 - 112 mEq/L   CO2 29  19 - 32 mEq/L   Glucose, Bld 105 (*) 70 - 99 mg/dL   BUN 6  6 - 23 mg/dL   Creatinine, Ser 4.78  0.50 - 1.10 mg/dL   Calcium 9.2  8.4 - 29.5 mg/dL   Total Protein 6.8  6.0 - 8.3 g/dL   Albumin 3.8  3.5 - 5.2 g/dL   AST 18  0 - 37 U/L   ALT 30  0 - 35 U/L   Alkaline Phosphatase 113  39 - 117 U/L   Total Bilirubin 0.2 (*) 0.3 - 1.2 mg/dL   GFR calc non Af Amer >90  >90 mL/min   GFR calc Af Amer >90  >90 mL/min  URINALYSIS, ROUTINE W REFLEX MICROSCOPIC      Result Value Range   Color, Urine YELLOW  YELLOW   APPearance CLEAR  CLEAR   Specific Gravity, Urine 1.008  1.005 - 1.030   pH 6.0  5.0 - 8.0   Glucose, UA NEGATIVE  NEGATIVE mg/dL   Hgb urine dipstick LARGE (*) NEGATIVE   Bilirubin Urine NEGATIVE  NEGATIVE   Ketones, ur NEGATIVE  NEGATIVE mg/dL   Protein, ur NEGATIVE  NEGATIVE mg/dL   Urobilinogen, UA 0.2  0.0 - 1.0 mg/dL   Nitrite NEGATIVE  NEGATIVE   Leukocytes, UA NEGATIVE  NEGATIVE  URINE MICROSCOPIC-ADD ON      Result Value Range   Squamous Epithelial / LPF FEW (*) RARE   WBC, UA 0-2  <3 WBC/hpf   RBC /  HPF 3-6  <3 RBC/hpf   Bacteria, UA FEW (*) RARE  POCT I-STAT TROPONIN I      Result Value Range   Troponin i, poc 0.01  0.00 - 0.08 ng/mL   Comment 3            Ct Head Wo Contrast  02/24/2013  *RADIOLOGY REPORT*  Clinical Data:  Breast cancer.  Left arm numbness and blurred vision  CT HEAD WITHOUT CONTRAST  Technique:  Contiguous axial images were obtained from the base of the skull through the vertex without contrast  Comparison:  None.  Findings:  The brain has a normal appearance without evidence for hemorrhage, acute infarction, hydrocephalus, or mass lesion.  There is no extra axial  fluid collection.  The skull and paranasal sinuses are normal.  IMPRESSION: Normal CT of the head without contrast.   Original Report Authenticated By: Janeece Riggers, M.D.     8:20 PM CT negative. Pt's symptoms currently resolved. She does report a headache. I called and discussed pt's symptoms with Dr. Leroy Kennedy, who recommended MR brain to r/o metastatic disease vs cva. Pt agrees.        1. Neuropathy       MDM  PT with breast ca, currently on chemotherapy. Here with non specific numbness symptoms and visual changes. Pt had a work up to r/o cva, and had MRI w/wo contrast to look for possible metastatic disease.  Her lab work showed elevated WBC which could be due to her chemotherapy yesterday. Pt signed out at shift change with PA Manus Rudd pending MR        Lottie Mussel, PA-C 02/25/13 1026

## 2013-02-24 NOTE — ED Notes (Addendum)
Pt has breast cancer and was given her herceptin yesterday and today she had L arm numbness, numbness in the crown of her head and blurred vision. Stroke screen negative. Neuro intact. Called doctor and they told  Her to come in for eval. Numbness in her arm started at 9am. Blurred vision started last night. 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

## 2013-02-24 NOTE — Telephone Encounter (Signed)
Pt LVMOM stating she was experiencing "increased numbness to part of my scalp toward the back of the head, left shoulder down to arm and hands" as well as having "blurry vision to both eyes that started last night." Patient states not experiencing headaches or dizziness, states she took her b/p medicine and her B/P is "fine." Patient's concern that it may be r/t  Chemo treatment.  Reviewed patients concern with Dr Welton Flakes and informed patient that Dr Welton Flakes would like for her to go to the ER to be evaluated. Patient states she can't go now and will need to speak with her husband first, would not confirm whether she was going to go to ER just restated she needed to speak with her husband.

## 2013-02-24 NOTE — ED Notes (Signed)
Patient requesting that blood be drawn from a vein and her port not be accessed.

## 2013-02-24 NOTE — Telephone Encounter (Signed)
Patient called asking why she needs to go to ER.  Read previous phone message from earlier today and explained to patient these symptoms need evaluation which this office cannot assess.  Asked isn't this the neuropathy from the chemotherapy.  Expressed that blurred vision along with the location numbness to the left arm and scalp are not where the neuropathy is with taxol.  Expressed this could be warnings of TIA's or cardiac events.  Says this has happened before and is getting worse so again I emphasized importance of further evaluation.  Thanked me foe explaining and ended the call.

## 2013-02-24 NOTE — ED Notes (Signed)
Kirichenko, PA at bedside discussing plan of care.

## 2013-02-24 NOTE — ED Notes (Signed)
EKG showed to [facility].

## 2013-02-24 NOTE — ED Provider Notes (Signed)
  Physical Exam  BP 141/84  Pulse 88  Temp(Src) 98 F (36.7 C) (Oral)  Resp 20  SpO2 97%  LMP 01/07/2009  Physical Exam Reviewed MRI with normal results will dc home to FU with oncologist  ED Course  Procedures  MDM       Arman Filter, NP 02/24/13 2329

## 2013-02-25 ENCOUNTER — Encounter: Payer: Self-pay | Admitting: Oncology

## 2013-02-26 ENCOUNTER — Other Ambulatory Visit: Payer: Self-pay | Admitting: Emergency Medicine

## 2013-02-26 ENCOUNTER — Telehealth: Payer: Self-pay | Admitting: *Deleted

## 2013-02-26 MED ORDER — GABAPENTIN 100 MG PO CAPS: 100.0000 mg | ORAL_CAPSULE | Freq: Three times a day (TID) | ORAL | Status: DC

## 2013-02-26 NOTE — Telephone Encounter (Signed)
PT. WAS SENT TO THE EMERGENCY ROOM ON 02/24/13. SHE WAS DIAGNOSED WITH NEUROPATHY. PT. WOULD LIKE A PRESCRIPTION FOR A MEDICATION TO HELP WITH HER SYMPTOMS. THIS INFORMATION WAS GIVEN TO DR.KHAN'S NURSE, MEREDITH WALTON,RN.

## 2013-02-27 ENCOUNTER — Telehealth: Payer: Self-pay | Admitting: Emergency Medicine

## 2013-02-27 NOTE — Telephone Encounter (Signed)
Spoke with patient in regards to her neuropathy. Notified patient that a prescription was called into her pharmacy of Neurontin 100mg  TID. Patient voicing concerns about taking medications as she is very sensitive to lots of things. Instructed patient that she could start by taking one dose at bedtime and discuss increasing the frequency with Dr Welton Flakes at her next appointment on 03/02/13. Patient verbalized understanding. No further questions or concerns voiced. Patient instructed to call with any concerns.

## 2013-03-02 ENCOUNTER — Ambulatory Visit: Payer: BC Managed Care – PPO

## 2013-03-02 ENCOUNTER — Other Ambulatory Visit: Payer: BC Managed Care – PPO | Admitting: Lab

## 2013-03-02 ENCOUNTER — Encounter: Payer: Self-pay | Admitting: Oncology

## 2013-03-02 ENCOUNTER — Ambulatory Visit: Payer: BC Managed Care – PPO | Admitting: Adult Health

## 2013-03-02 ENCOUNTER — Ambulatory Visit (HOSPITAL_BASED_OUTPATIENT_CLINIC_OR_DEPARTMENT_OTHER): Payer: BC Managed Care – PPO

## 2013-03-02 ENCOUNTER — Other Ambulatory Visit (HOSPITAL_BASED_OUTPATIENT_CLINIC_OR_DEPARTMENT_OTHER): Payer: BC Managed Care – PPO | Admitting: Lab

## 2013-03-02 ENCOUNTER — Ambulatory Visit (HOSPITAL_BASED_OUTPATIENT_CLINIC_OR_DEPARTMENT_OTHER): Payer: BC Managed Care – PPO | Admitting: Oncology

## 2013-03-02 VITALS — BP 129/81 | HR 92 | Temp 98.5°F | Resp 20 | Ht 66.0 in | Wt 230.0 lb

## 2013-03-02 DIAGNOSIS — C50919 Malignant neoplasm of unspecified site of unspecified female breast: Secondary | ICD-10-CM

## 2013-03-02 DIAGNOSIS — K219 Gastro-esophageal reflux disease without esophagitis: Secondary | ICD-10-CM

## 2013-03-02 DIAGNOSIS — C50419 Malignant neoplasm of upper-outer quadrant of unspecified female breast: Secondary | ICD-10-CM

## 2013-03-02 DIAGNOSIS — Z5112 Encounter for antineoplastic immunotherapy: Secondary | ICD-10-CM

## 2013-03-02 DIAGNOSIS — G5690 Unspecified mononeuropathy of unspecified upper limb: Secondary | ICD-10-CM

## 2013-03-02 DIAGNOSIS — R21 Rash and other nonspecific skin eruption: Secondary | ICD-10-CM

## 2013-03-02 DIAGNOSIS — C50411 Malignant neoplasm of upper-outer quadrant of right female breast: Secondary | ICD-10-CM

## 2013-03-02 LAB — CBC WITH DIFFERENTIAL/PLATELET
BASO%: 0.7 % (ref 0.0–2.0)
EOS%: 0 % (ref 0.0–7.0)
HCT: 30.3 % — ABNORMAL LOW (ref 34.8–46.6)
HGB: 9.6 g/dL — ABNORMAL LOW (ref 11.6–15.9)
MCH: 25.7 pg (ref 25.1–34.0)
MCHC: 31.7 g/dL (ref 31.5–36.0)
MONO#: 0.4 10*3/uL (ref 0.1–0.9)
RDW: 14.5 % (ref 11.2–14.5)
WBC: 8.7 10*3/uL (ref 3.9–10.3)
lymph#: 2 10*3/uL (ref 0.9–3.3)

## 2013-03-02 LAB — COMPREHENSIVE METABOLIC PANEL (CC13)
ALT: 61 U/L — ABNORMAL HIGH (ref 0–55)
AST: 24 U/L (ref 5–34)
Albumin: 3.6 g/dL (ref 3.5–5.0)
Alkaline Phosphatase: 114 U/L (ref 40–150)
Calcium: 9.6 mg/dL (ref 8.4–10.4)
Chloride: 105 mEq/L (ref 98–107)
Potassium: 4 mEq/L (ref 3.5–5.1)
Sodium: 142 mEq/L (ref 136–145)
Total Protein: 6.8 g/dL (ref 6.4–8.3)

## 2013-03-02 MED ORDER — SODIUM CHLORIDE 0.9 % IJ SOLN
10.0000 mL | INTRAMUSCULAR | Status: DC | PRN
  Administered 2013-03-02: 10 mL
  Filled 2013-03-02: qty 10

## 2013-03-02 MED ORDER — ACETAMINOPHEN 325 MG PO TABS
650.0000 mg | ORAL_TABLET | Freq: Once | ORAL | Status: AC
  Administered 2013-03-02: 650 mg via ORAL

## 2013-03-02 MED ORDER — DIPHENHYDRAMINE HCL 25 MG PO CAPS
50.0000 mg | ORAL_CAPSULE | Freq: Once | ORAL | Status: AC
  Administered 2013-03-02: 50 mg via ORAL

## 2013-03-02 MED ORDER — SODIUM CHLORIDE 0.9 % IV SOLN
Freq: Once | INTRAVENOUS | Status: AC
  Administered 2013-03-02: 10:00:00 via INTRAVENOUS

## 2013-03-02 MED ORDER — GABAPENTIN 100 MG PO CAPS: ORAL_CAPSULE | ORAL | Status: DC

## 2013-03-02 MED ORDER — TRASTUZUMAB CHEMO INJECTION 440 MG
2.0000 mg/kg | Freq: Once | INTRAVENOUS | Status: AC
  Administered 2013-03-02: 210 mg via INTRAVENOUS
  Filled 2013-03-02: qty 10

## 2013-03-02 MED ORDER — HEPARIN SOD (PORK) LOCK FLUSH 100 UNIT/ML IV SOLN
500.0000 [IU] | Freq: Once | INTRAVENOUS | Status: AC | PRN
  Administered 2013-03-02: 500 [IU]
  Filled 2013-03-02: qty 5

## 2013-03-02 MED ORDER — SUCRALFATE 1 G PO TABS: 1.0000 g | ORAL_TABLET | Freq: Four times a day (QID) | ORAL | Status: DC

## 2013-03-02 NOTE — Progress Notes (Signed)
Ok to treat with HGB 98 per Dr. Welton Flakes.

## 2013-03-02 NOTE — Patient Instructions (Addendum)
Begin carafate 1 gram 4 times a day  Increase neurontin to 200 mg three times a day.  Use aquaphor on skin for dryness  I will see you back on 3/31

## 2013-03-02 NOTE — Patient Instructions (Signed)
[facility] Discharge Instructions for Patients Receiving Chemotherapy  Today you received the following chemotherapy agents: herceptin  To help prevent nausea and vomiting after your treatment, we encourage you to take your nausea medication.  Take it as often as prescribed.     If you develop nausea and vomiting that is not controlled by your nausea medication, call the clinic. If it is after clinic hours your family physician or the after hours number for the clinic or go to the Emergency Department.   BELOW ARE SYMPTOMS THAT SHOULD BE REPORTED IMMEDIATELY:  *FEVER GREATER THAN 100.5 F  *CHILLS WITH OR WITHOUT FEVER  NAUSEA AND VOMITING THAT IS NOT CONTROLLED WITH YOUR NAUSEA MEDICATION  *UNUSUAL SHORTNESS OF BREATH  *UNUSUAL BRUISING OR BLEEDING  TENDERNESS IN MOUTH AND THROAT WITH OR WITHOUT PRESENCE OF ULCERS  *URINARY PROBLEMS  *BOWEL PROBLEMS  UNUSUAL RASH Items with * indicate a potential emergency and should be followed up as soon as possible.  Feel free to call the clinic you have any questions or concerns. The clinic phone number is [phone].   I have been informed and understand all the instructions given to me. I know to contact the clinic, my physician, or go to the Emergency Department if any problems should occur. I do not have any questions at this time, but understand that I may call the clinic during office hours   should I have any questions or need assistance in obtaining follow up care.    __________________________________________  _____________  __________ Signature of Patient or Authorized Representative            Date                   Time    __________________________________________ Nurse's Signature    

## 2013-03-02 NOTE — Progress Notes (Signed)
OFFICE PROGRESS NOTE  CC  Julia Zuniga, Julia Lemon, MD [address] [place] [address] Dr. Almond Lint Dr. Lurline Hare  DIAGNOSIS: 52 year old female with invasive ductal carcinoma of the right breast diagnosed December 2013.  PRIOR THERAPY:  #1 patient was originally seen in the multidisciplinary breast clinic on 12/17/2012 with a screening mammogram that showed an abnormality in the right breast. This abnormality measured 1.6 cm. She had a biopsy performed that showed invasive ductal carcinoma grade 2, ER negative, PR negative, HER-2 positive with a Ki-67 of 80%.  #2 patient is now status post right lumpectomy on 12/31/2012 with the pathology revealing a 8 cm invasive ductal carcinoma grade 3 with lymphovascular invasion, ER negative PR negative HER-2/neu positive with amplification of 4.36, 2 sentinel nodes were negative for metastatic disease. Patient did have positive margins and she will need a reexcision which will be performed on 01/14/2013. Patient has a Port-A-Cath in place.  #3 patient will proceed with adjuvant chemotherapy initially consisting of Taxotere carboplatinum and Herceptin. With Taxotere carboplatinum to be given every 21 days and Herceptin on a weekly basis for a total of 6 cycles of TC.we will plan on starting her first cycle of chemotherapy on 01/26/2013.  CURRENT THERAPY: Here for Herceptin  INTERVAL HISTORY: PAYTIENCE Julia Zuniga 52 y.o. female returns for followup today. She was recently last week seen in the emergency room with what sounds like acute neuropathic pain. Since then she has begun Neurontin with some relief but it has not completely dissipated. She continues to have significant problems with heartburn. She has been taking in an antacid but is still not helping. She also continues to have before he and dryness of the eyes. As well as rashes especially on her scalp. She periodically does get diarrhea when she gets the Taxotere. Today she denies having  any nausea or vomiting no fevers or chills.  MEDICAL HISTORY: Past Medical History  Diagnosis Date  . Breast cancer   . Hyperlipidemia   . Diverticulitis   . Hernia, hiatal   . Heartburn   . Anxiety   . Hot flashes   . PONV (postoperative nausea and vomiting)     ALLERGIES:  is allergic to codeine; medralone; and oxycodone-acetaminophen.  MEDICATIONS:  Current Outpatient Prescriptions  Medication Sig Dispense Refill  . atorvastatin (LIPITOR) 40 MG tablet Take 40 mg by mouth every evening.      . carvedilol (COREG) 6.25 MG tablet Take 1 tablet (6.25 mg total) by mouth 2 (two) times daily with a meal.  60 tablet  6  . dexamethasone (DECADRON) 4 MG tablet Take 2 tablets (8 mg total) by mouth 2 (two) times daily with a meal. Take two times a day the day before Taxotere. Then take two times a day starting the day after chemo for 3 days.  30 tablet  1  . gabapentin (NEURONTIN) 100 MG capsule Take 1 capsule (100 mg total) by mouth 3 (three) times daily.  90 capsule  1  . omeprazole (PRILOSEC) 40 MG capsule Take 1 capsule (40 mg total) by mouth daily.  90 capsule  2  . ondansetron (ZOFRAN) 8 MG tablet Take 1 tablet (8 mg total) by mouth 2 (two) times daily. Take two times a day starting the day after chemo for 3 days. Then take two times a day as needed for nausea or vomiting.  30 tablet  1  . UNABLE TO FIND Cranial prosthesis due to chemotherapy induced hair loss.  1 Units  0  . B Complex-C (SUPER B COMPLEX PO) Take 1 tablet by mouth daily.      Julia Zuniga HYDROcodone-acetaminophen (NORCO) 5-325 MG per tablet Take 1-2 tablets by mouth every 6 (six) hours as needed for pain.  15 tablet  1  . ibuprofen (ADVIL,MOTRIN) 200 MG tablet Take 400 mg by mouth every 6 (six) hours as needed. Pain      . nystatin (MYCOSTATIN) 100000 UNIT/ML suspension Take 5 mLs (500,000 Units total) by mouth 4 (four) times daily.  60 mL  0  . Probiotic Product (PROBIOTIC DAILY PO) Take 1 tablet by mouth daily.       .  prochlorperazine (COMPAZINE) 25 MG suppository Place 1 suppository (25 mg total) rectally every 12 (twelve) hours as needed for nausea.  12 suppository  3   No current facility-administered medications for this visit.    SURGICAL HISTORY:  Past Surgical History  Procedure Laterality Date  . Cesarean section      x 3  . Tubal ligation    . Tonsillectomy and adenoidectomy    . Cholecystectomy  2001    lap choli  . Breast lumpectomy with sentinel lymph node biopsy  12/31/2012    Procedure: BREAST LUMPECTOMY WITH SENTINEL LYMPH NODE BX;  Surgeon: Almond Lint, MD;  Location: [facility];  Service: General;  Laterality: Right;  . Portacath placement  12/31/2012    Procedure: INSERTION PORT-A-CATH;  Surgeon: Almond Lint, MD;  Location: [facility];  Service: General;  Laterality: Left;  Left Subclavian Vein  . Re-excision of breast cancer,superior margins  01/14/2013    Procedure: RE-EXCISION OF BREAST CANCER,SUPERIOR MARGINS;  Surgeon: Almond Lint, MD;  Location: [facility];  Service: General;  Laterality: Right;  right breast re excision for markers     REVIEW OF SYSTEMS:   General: fatigue (+), night sweats (-), fever (-), pain (-) Lymph: palpable nodes (-) HEENT: vision changes (-), mucositis (+), gum bleeding (-), epistaxis (-) Cardiovascular: chest pain (-), palpitations (-) Pulmonary: shortness of breath (-), dyspnea on exertion (-), cough (-), hemoptysis (-) GI:  Early satiety (-), melena (-), dysphagia (-), nausea/vomiting (+), diarrhea (+) GU: dysuria (-), hematuria (-), incontinence (-) Musculoskeletal: joint swelling (-), joint pain (-), back pain (-) Neuro: weakness (-), numbness (+), headache (-), confusion (-) Skin: Rash (-), lesions (-), dryness (-) Psych: depression (-), suicidal/homicidal ideation (-), feeling of hopelessness (-)  PHYSICAL EXAMINATION: Blood pressure 129/81, pulse 92, temperature 98.5 F (36.9 C), temperature source Oral, resp.  rate 20, height 5\' 6"  (1.676 m), weight 230 lb (104.327 kg), last menstrual period 01/07/2009. Body mass index is 37.14 kg/(m^2). General: Patient is a well appearing female in no acute distress HEENT: PERRLA, sclerae anicteric no conjunctival pallor, MMM, erythema and ulcerations on tongue, no white patches Neck: supple, no palpable adenopathy Lungs: clear to auscultation bilaterally, no wheezes, rhonchi, or rales Cardiovascular: regular rate rhythm, S1, S2, no murmurs, rubs or gallops Abdomen: Soft, non-tender, non-distended, normoactive bowel sounds, no HSM Extremities: warm and well perfused, no clubbing, cyanosis, or edema Skin: No rashes or lesions Neuro: Non-focal ECOG PERFORMANCE STATUS: 0 - Asymptomatic Right breast lumpectomy scar healed, no nodularity, left breast, no masses or nipple discharge   LABORATORY DATA: Lab Results  Component Value Date   WBC 8.7 03/02/2013   HGB 9.6* 03/02/2013   HCT 30.3* 03/02/2013   MCV 81.0 03/02/2013   PLT 98* 03/02/2013      Chemistry  Component Value Date/Time   NA 135 02/24/2013 1857   NA 136 02/23/2013 0911   K 3.4* 02/24/2013 1857   K 3.8 02/23/2013 0911   CL 97 02/24/2013 1857   CL 97* 02/23/2013 0911   CO2 29 02/24/2013 1857   CO2 28 02/23/2013 0911   BUN 6 02/24/2013 1857   BUN 11.1 02/23/2013 0911   CREATININE 0.55 02/24/2013 1857   CREATININE 0.7 02/23/2013 0911      Component Value Date/Time   CALCIUM 9.2 02/24/2013 1857   CALCIUM 10.0 02/23/2013 0911   ALKPHOS 113 02/24/2013 1857   ALKPHOS 115 02/23/2013 0911   AST 18 02/24/2013 1857   AST 17 02/23/2013 0911   ALT 30 02/24/2013 1857   ALT 37 02/23/2013 0911   BILITOT 0.2* 02/24/2013 1857   BILITOT 0.42 02/23/2013 0911    ADDITIONAL INFORMATION: 1. PROGNOSTIC INDICATORS - ACIS Results IMMUNOHISTOCHEMICAL AND MORPHOMETRIC ANALYSIS BY THE AUTOMATED CELLULAR IMAGING SYSTEM (ACIS) Estrogen Receptor (Negative, <1%): 0%, NEGATIVE Progesterone Receptor (Negative, <1%): 0%,  NEGATIVE COMMENT: The negative hormone receptor study(ies) in this case have an internal positive control. All controls stained appropriately Pecola Leisure MD Pathologist, Electronic Signature ( Signed 01/07/2013) 1. CHROMOGENIC IN-SITU HYBRIDIZATION Interpretation: HER2/NEU BY CISH - SHOWS AMPLIFICATION BY CISH ANALYSIS. THE RATIO OF HER2: CEP 17 SIGNALS WAS 4.36. Reference range: Ratio: HER2:CEP17 < 1.8 gene amplification not observed Ratio: HER2:CEP 17 1.8-2.2 - equivocal result Ratio: HER2:CEP17 > 2.2 - gene amplification observed 1 of 4 FINAL for FLORABEL, FAULKS ([id number]) ADDITIONAL INFORMATION:(continued) Pecola Leisure MD Pathologist, Electronic Signature ( Signed 01/07/2013) FINAL DIAGNOSIS Diagnosis 1. Breast, lumpectomy, Right - INVASIVE DUCTAL CARCINOMA, GRADE III (1.8 CM), SEE COMMENT. - LYMPHOVASCULAR INVASION IDENTIFIED. - INVASIVE TUMOR IS 1 MM TO NEAREST MARGIN (POSTERIOR). - DUCTAL CARCINOMA IN SITU, GRADE III WITH COMEDO NECROSIS. - IN SITU CARCINOMA IS LESS THAN 0.1 MM FROM ANTERIOR-INFERIOR AND POSTERIOR MARGINS. - SEE TUMOR TEMPLATE BELOW. 2. Lymph node, sentinel, biopsy, Right axillary - ONE LYMPH NODE, NEGATIVE FOR TUMOR (0/1), SEE COMMENT. 3. Lymph node, sentinel, biopsy, Right axillary - ONE LYMPH NODE, NEGATIVE FOR TUMOR (0/1), SEE COMMENT. Microscopic Comment 1. BREAST, INVASIVE TUMOR, WITH LYMPH NODE SAMPLING Specimen, including laterality: Right breast Procedure: Lumpectomy Grade: III of III Tubule formation: II Nuclear pleomorphism: III Mitotic:III Tumor size (gross measurement): 1.8 cm Margins: Invasive, distance to closest margin: 0.6 cm In-situ, distance to closest margin: less than 0.1 cm If margin positive, focally or broadly: N/A Lymphovascular invasion: Present Ductal carcinoma in situ: Present Grade: III of III Extensive intraductal component: Absent Lobular neoplasia: Absent Tumor focality: Unifocal Treatment effect: None If  present, treatment effect in breast tissue, lymph nodes or both: N/A Extent of tumor: Skin: N/A Nipple: N/A Skeletal muscle: N/A Lymph nodes: # examined: 2 Lymph nodes with metastasis: 0 Breast prognostic profile: Estrogen receptor: Repeated, previous study demonstrated 0% positivity ([id number]) 2 of 4 FINAL for JENNETT, TARBELL ([id number]) Microscopic Comment(continued) Progesterone receptor: Repeated, previous study demonstrated 0% positivity ([id number]) Her 2 neu: Repeated, previous study demonstrated amplification (3.88) ([id number]) Ki-67: Not repeated, previous study demonstrated 83% proliferation rate ([id number]) Non-neoplastic breast: Previous biopsy site, fibrocystic change and microcalcifications TNM: pT1c pN0 pMX   RADIOGRAPHIC STUDIES:  Mr Breast Bilateral W Wo Contrast  12/23/2012  *RADIOLOGY REPORT*  Clinical Data: recent diagnosis of invasive ductal carcinoma 12 o'clock right breast, for treatment planning  BILATERAL BREAST MRI WITH AND WITHOUT CONTRAST  Technique: Multiplanar, multisequence MR images of both breasts were obtained prior  to and following the intravenous administration of 20ml of multihance.  Three dimensional images were evaluated at the independent DynaCad workstation.  Comparison:  mammographic and ultrasound images 12/08/12 and 11/20/12  Findings: There is mild background parenchymal enhancement.  The left breast is negative. There is no evidence of axillary or internal mammary adenopathy.  There is no abnormal skin or nipple enhancement.  On the right, associated with the recently placed biopsy marker clip, there is an enhancing spiculated mass in the 12 o'clock position 11cm from the nipple.  The mass measures 23 x 12mm transverse dimension by 35mm craniocaudal dimension.  1cm anterior and slightly inferior to the mass is a second 7mm mass which is connected to the dominant mass by a thin bridge of enhancement. There are no other abnormalities in the  right breast.  IMPRESSION: Biopsy-proven invasive carcinoma in the 12 o'clock position of the right breast, with an additional satellite focus as described above.  BI-RADS CATEGORY 6:  Known biopsy-proven malignancy - appropriate action should be taken.  THREE-DIMENSIONAL MR IMAGE RENDERING ON INDEPENDENT WORKSTATION:  Three-dimensional MR images were rendered by post-processing of the original MR data on an independent workstation.  The three- dimensional MR images were interpreted, and findings were reported in the accompanying complete MRI report for this study.   Original Report Authenticated By: Esperanza Heir, M.D.    Nm Sentinel Node Inj-no Rpt (breast)  12/31/2012  CLINICAL DATA: right breast cancer   Sulfur colloid was injected intradermally by the nuclear medicine  technologist for breast cancer sentinel node localization.     Dg Chest Port 1 View  12/31/2012  *RADIOLOGY REPORT*  Clinical Data: Port-A-Cath placement.  History of breast cancer.  PORTABLE CHEST - 1 VIEW  Comparison: 03/26/2011.  Findings: Central line has been placed from the left.  The tip is at the level of the distal superior vena cava/caval atrial junction.  No gross pneumothorax.  Consolidation medial aspect of the lung bases may be present possibly representing atelectasis or infiltrate.  Follow-up two- view chest with better inspiration recommended for further delineation.  Heart size top normal.  Mild central pulmonary vascular prominence.  Surgical clips project over the right lower chest and may be related to breast reconstruction/axillary lymph node dissection.  IMPRESSION: Central line has been placed from the left.  The tip is at the level of the distal superior vena cava/caval atrial junction.  No gross pneumothorax.  Consolidation medial aspect of the lung bases may be present possibly representing atelectasis or infiltrate.  Follow-up two- view chest with better inspiration recommended for further delineation.    Original Report Authenticated By: Lacy Duverney, M.D.    Dg Fluoro Guide Cv Line-no Report  12/31/2012  CLINICAL DATA: portacath   FLOURO GUIDE CV LINE  Fluoroscopy was utilized by the requesting physician.  No radiographic  interpretation.      ASSESSMENT: 52 year old female with  #1 new diagnosis of invasive ductal carcinoma of the right breast she is now status post lumpectomy with sentinel lymph node biopsy with the final pathology revealing a 1.8 cm invasive ductal carcinoma grade 3 ER negative PR negative HER-2/neu amplified at 4.36. 2 sentinel nodes were negative for metastatic disease. There was noted to be lymphovascular invasion. Postoperatively patient is doing well. She will go for a reexcision for positive margin.  #2 subsequently begun on adjuvant chemotherapy consisting of Taxotere carboplatinum and Herceptin starting 01/26/2013. Total of 6 cycles of TCH combination is planned.  #3 reflux which she  has a chronic history. But it is worsened since starting her chemotherapy.  #4 rash on her scapula.  #5 neuropathy patient was recently seen in the emergency room with tingling and numbness of her hands and scalp. She was begun on Neurontin. The neuropathy is a little bit better. She currently is on 100 mg 3 times a day of Neurontin.  PLAN:   #1 patient will proceed with Herceptin today.  #2 she will return in one week's time for [facility]. I have dose reduced her for her ideal body weight adjusted dosing of Taxotere and carboplatinum. Hoping that this will help with her symptomatology of asthenia  #3 reflux I will go ahead and put her on Carafate 1 g 4 times a day hopefully this will help.  #4 we will continue to monitor her rash. I do think this may be due to Taxotere. She is recommended to use significant amount of emollients.  All questions were answered. The patient knows to call the clinic with any problems, questions or concerns. We can certainly see the patient much sooner if  necessary.  I spent 25 minutes counseling the patient face to face. The total time spent in the appointment was 30 minutes.  Drue Second, MD Medical/Oncology [facility] [phone] (beeper) [phone] (Office)  03/02/2013, 3:14 PM

## 2013-03-09 ENCOUNTER — Encounter: Payer: Self-pay | Admitting: Oncology

## 2013-03-09 ENCOUNTER — Ambulatory Visit: Payer: BC Managed Care – PPO | Admitting: Oncology

## 2013-03-09 ENCOUNTER — Ambulatory Visit (HOSPITAL_BASED_OUTPATIENT_CLINIC_OR_DEPARTMENT_OTHER): Payer: BC Managed Care – PPO

## 2013-03-09 ENCOUNTER — Other Ambulatory Visit (HOSPITAL_BASED_OUTPATIENT_CLINIC_OR_DEPARTMENT_OTHER): Payer: BC Managed Care – PPO | Admitting: Lab

## 2013-03-09 VITALS — BP 151/89 | HR 94 | Temp 98.3°F | Resp 20 | Ht 66.0 in | Wt 232.8 lb

## 2013-03-09 DIAGNOSIS — C50419 Malignant neoplasm of upper-outer quadrant of unspecified female breast: Secondary | ICD-10-CM

## 2013-03-09 DIAGNOSIS — R21 Rash and other nonspecific skin eruption: Secondary | ICD-10-CM

## 2013-03-09 DIAGNOSIS — C50411 Malignant neoplasm of upper-outer quadrant of right female breast: Secondary | ICD-10-CM

## 2013-03-09 DIAGNOSIS — Z5111 Encounter for antineoplastic chemotherapy: Secondary | ICD-10-CM

## 2013-03-09 DIAGNOSIS — C50919 Malignant neoplasm of unspecified site of unspecified female breast: Secondary | ICD-10-CM

## 2013-03-09 DIAGNOSIS — Z5112 Encounter for antineoplastic immunotherapy: Secondary | ICD-10-CM

## 2013-03-09 LAB — CBC WITH DIFFERENTIAL/PLATELET
BASO%: 0.1 % (ref 0.0–2.0)
Basophils Absolute: 0 10*3/uL (ref 0.0–0.1)
EOS%: 0 % (ref 0.0–7.0)
MCH: 26.1 pg (ref 25.1–34.0)
MCHC: 31.7 g/dL (ref 31.5–36.0)
MCV: 82.3 fL (ref 79.5–101.0)
MONO%: 1.6 % (ref 0.0–14.0)
RBC: 3.79 10*6/uL (ref 3.70–5.45)
RDW: 16.3 % — ABNORMAL HIGH (ref 11.2–14.5)

## 2013-03-09 LAB — COMPREHENSIVE METABOLIC PANEL (CC13)
AST: 21 U/L (ref 5–34)
Alkaline Phosphatase: 113 U/L (ref 40–150)
Glucose: 200 mg/dl — ABNORMAL HIGH (ref 70–99)
Sodium: 139 mEq/L (ref 136–145)
Total Bilirubin: 0.31 mg/dL (ref 0.20–1.20)
Total Protein: 7.4 g/dL (ref 6.4–8.3)

## 2013-03-09 MED ORDER — TRASTUZUMAB CHEMO INJECTION 440 MG
2.0000 mg/kg | Freq: Once | INTRAVENOUS | Status: AC
  Administered 2013-03-09: 210 mg via INTRAVENOUS
  Filled 2013-03-09: qty 10

## 2013-03-09 MED ORDER — SODIUM CHLORIDE 0.9 % IJ SOLN
10.0000 mL | INTRAMUSCULAR | Status: DC | PRN
  Administered 2013-03-09: 10 mL
  Filled 2013-03-09: qty 10

## 2013-03-09 MED ORDER — DIPHENHYDRAMINE HCL 25 MG PO CAPS
50.0000 mg | ORAL_CAPSULE | Freq: Once | ORAL | Status: AC
  Administered 2013-03-09: 50 mg via ORAL

## 2013-03-09 MED ORDER — HEPARIN SOD (PORK) LOCK FLUSH 100 UNIT/ML IV SOLN
500.0000 [IU] | Freq: Once | INTRAVENOUS | Status: AC | PRN
  Administered 2013-03-09: 500 [IU]
  Filled 2013-03-09: qty 5

## 2013-03-09 MED ORDER — SODIUM CHLORIDE 0.9 % IV SOLN
Freq: Once | INTRAVENOUS | Status: AC
  Administered 2013-03-09: 10:00:00 via INTRAVENOUS

## 2013-03-09 MED ORDER — DOCETAXEL CHEMO INJECTION 160 MG/16ML: 75.0000 mg/m2 | Freq: Once | INTRAVENOUS | Status: DC

## 2013-03-09 MED ORDER — SODIUM CHLORIDE 0.9 % IV SOLN
900.0000 mg | Freq: Once | INTRAVENOUS | Status: AC
  Administered 2013-03-09: 900 mg via INTRAVENOUS
  Filled 2013-03-09: qty 90

## 2013-03-09 MED ORDER — ONDANSETRON 16 MG/50ML IVPB (CHCC)
16.0000 mg | Freq: Once | INTRAVENOUS | Status: AC
  Administered 2013-03-09: 16 mg via INTRAVENOUS

## 2013-03-09 MED ORDER — ACETAMINOPHEN 325 MG PO TABS
650.0000 mg | ORAL_TABLET | Freq: Once | ORAL | Status: AC
  Administered 2013-03-09: 650 mg via ORAL

## 2013-03-09 MED ORDER — SODIUM CHLORIDE 0.9 % IV SOLN
75.0000 mg/m2 | Freq: Once | INTRAVENOUS | Status: AC
  Administered 2013-03-09: 140 mg via INTRAVENOUS
  Filled 2013-03-09: qty 14

## 2013-03-09 MED ORDER — DEXAMETHASONE SODIUM PHOSPHATE 4 MG/ML IJ SOLN
20.0000 mg | Freq: Once | INTRAMUSCULAR | Status: AC
  Administered 2013-03-09: 20 mg via INTRAVENOUS

## 2013-03-09 NOTE — Progress Notes (Signed)
OFFICE PROGRESS NOTE  CC  DOOLITTLE, Harrel Lemon, MD [address] [place] [address] Dr. Almond Lint Dr. Lurline Hare  DIAGNOSIS: 52 year old female with invasive ductal carcinoma of the right breast diagnosed December 2013.  PRIOR THERAPY:  #1 patient was originally seen in the multidisciplinary breast clinic on 12/17/2012 with a screening mammogram that showed an abnormality in the right breast. This abnormality measured 1.6 cm. She had a biopsy performed that showed invasive ductal carcinoma grade 2, ER negative, PR negative, HER-2 positive with a Ki-67 of 80%.  #2 patient is now status post right lumpectomy on 12/31/2012 with the pathology revealing a 8 cm invasive ductal carcinoma grade 3 with lymphovascular invasion, ER negative PR negative HER-2/neu positive with amplification of 4.36, 2 sentinel nodes were negative for metastatic disease. Patient did have positive margins and she will need a reexcision which will be performed on 01/14/2013. Patient has a Port-A-Cath in place.  #3 patient will proceed with adjuvant chemotherapy initially consisting of Taxotere carboplatinum and Herceptin. With Taxotere carboplatinum to be given every 21 days and Herceptin on a weekly basis for a total of 6 cycles of TC.we will plan on starting her first cycle of chemotherapy on 01/26/2013.  CURRENT THERAPY: here for cycle 3 of TCH  INTERVAL HISTORY: Julia Zuniga 52 y.o. female returns for followup today. She seems to be doing well. She does have some fatigue. She does develop nausea as well. Her heartburn is better controlled since she has been taking Carafate. She has no headaches double vision blurring of vision no recent emergency room visits. Remainder of the 10 point review of systems is negative. MEDICAL HISTORY: Past Medical History  Diagnosis Date  . Breast cancer   . Hyperlipidemia   . Diverticulitis   . Hernia, hiatal   . Heartburn   . Anxiety   . Hot flashes   . PONV  (postoperative nausea and vomiting)     ALLERGIES:  is allergic to codeine; medralone; and oxycodone-acetaminophen.  MEDICATIONS:  Current Outpatient Prescriptions  Medication Sig Dispense Refill  . atorvastatin (LIPITOR) 40 MG tablet Take 40 mg by mouth every evening.      . B Complex-C (SUPER B COMPLEX PO) Take 1 tablet by mouth daily.      . carvedilol (COREG) 6.25 MG tablet Take 1 tablet (6.25 mg total) by mouth 2 (two) times daily with a meal.  60 tablet  6  . dexamethasone (DECADRON) 4 MG tablet Take 2 tablets (8 mg total) by mouth 2 (two) times daily with a meal. Take two times a day the day before Taxotere. Then take two times a day starting the day after chemo for 3 days.  30 tablet  1  . gabapentin (NEURONTIN) 100 MG capsule Take 2 capsules three times a day  180 capsule  6  . omeprazole (PRILOSEC) 40 MG capsule Take 1 capsule (40 mg total) by mouth daily.  90 capsule  2  . ondansetron (ZOFRAN) 8 MG tablet Take 1 tablet (8 mg total) by mouth 2 (two) times daily. Take two times a day starting the day after chemo for 3 days. Then take two times a day as needed for nausea or vomiting.  30 tablet  1  . UNABLE TO FIND Cranial prosthesis due to chemotherapy induced hair loss.  1 Units  0  . HYDROcodone-acetaminophen (NORCO) 5-325 MG per tablet Take 1-2 tablets by mouth every 6 (six) hours as needed for pain.  15 tablet  1  .  ibuprofen (ADVIL,MOTRIN) 200 MG tablet Take 400 mg by mouth every 6 (six) hours as needed. Pain      . nystatin (MYCOSTATIN) 100000 UNIT/ML suspension Take 5 mLs (500,000 Units total) by mouth 4 (four) times daily.  60 mL  0  . Probiotic Product (PROBIOTIC DAILY PO) Take 1 tablet by mouth daily.       . prochlorperazine (COMPAZINE) 25 MG suppository Place 1 suppository (25 mg total) rectally every 12 (twelve) hours as needed for nausea.  12 suppository  3  . sucralfate (CARAFATE) 1 G tablet Take 1 tablet (1 g total) by mouth 4 (four) times daily.  120 tablet  5   No  current facility-administered medications for this visit.    SURGICAL HISTORY:  Past Surgical History  Procedure Laterality Date  . Cesarean section      x 3  . Tubal ligation    . Tonsillectomy and adenoidectomy    . Cholecystectomy  2001    lap choli  . Breast lumpectomy with sentinel lymph node biopsy  12/31/2012    Procedure: BREAST LUMPECTOMY WITH SENTINEL LYMPH NODE BX;  Surgeon: Almond Lint, MD;  Location: [facility];  Service: General;  Laterality: Right;  . Portacath placement  12/31/2012    Procedure: INSERTION PORT-A-CATH;  Surgeon: Almond Lint, MD;  Location: [facility];  Service: General;  Laterality: Left;  Left Subclavian Vein  . Re-excision of breast cancer,superior margins  01/14/2013    Procedure: RE-EXCISION OF BREAST CANCER,SUPERIOR MARGINS;  Surgeon: Almond Lint, MD;  Location: [facility];  Service: General;  Laterality: Right;  right breast re excision for markers     REVIEW OF SYSTEMS:   General: fatigue (+), night sweats (-), fever (-), pain (-) Lymph: palpable nodes (-) HEENT: vision changes (-), mucositis (+), gum bleeding (-), epistaxis (-) Cardiovascular: chest pain (-), palpitations (-) Pulmonary: shortness of breath (-), dyspnea on exertion (-), cough (-), hemoptysis (-) GI:  Early satiety (-), melena (-), dysphagia (-), nausea/vomiting (+), diarrhea (+) GU: dysuria (-), hematuria (-), incontinence (-) Musculoskeletal: joint swelling (-), joint pain (-), back pain (-) Neuro: weakness (-), numbness (+), headache (-), confusion (-) Skin: Rash (-), lesions (-), dryness (-) Psych: depression (-), suicidal/homicidal ideation (-), feeling of hopelessness (-)  PHYSICAL EXAMINATION: Blood pressure 151/89, pulse 94, temperature 98.3 F (36.8 C), temperature source Oral, resp. rate 20, height 5\' 6"  (1.676 m), weight 232 lb 12.8 oz (105.597 kg), last menstrual period 01/07/2009. Body mass index is 37.59 kg/(m^2). General: Patient is a  well appearing female in no acute distress HEENT: PERRLA, sclerae anicteric no conjunctival pallor, MMM, erythema and ulcerations on tongue, no white patches Neck: supple, no palpable adenopathy Lungs: clear to auscultation bilaterally, no wheezes, rhonchi, or rales Cardiovascular: regular rate rhythm, S1, S2, no murmurs, rubs or gallops Abdomen: Soft, non-tender, non-distended, normoactive bowel sounds, no HSM Extremities: warm and well perfused, no clubbing, cyanosis, or edema Skin: No rashes or lesions Neuro: Non-focal ECOG PERFORMANCE STATUS: 0 - Asymptomatic Right breast lumpectomy scar healed, no nodularity, left breast, no masses or nipple discharge   LABORATORY DATA: Lab Results  Component Value Date   WBC 17.8* 03/09/2013   HGB 9.9* 03/09/2013   HCT 31.2* 03/09/2013   MCV 82.3 03/09/2013   PLT 206 03/09/2013      Chemistry      Component Value Date/Time   NA 142 03/02/2013 0811   NA 135 02/24/2013 1857   K 4.0 03/02/2013 1191  K 3.4* 02/24/2013 1857   CL 105 03/02/2013 0811   CL 97 02/24/2013 1857   CO2 27 03/02/2013 0811   CO2 29 02/24/2013 1857   BUN 8.7 03/02/2013 0811   BUN 6 02/24/2013 1857   CREATININE 0.6 03/02/2013 0811   CREATININE 0.55 02/24/2013 1857      Component Value Date/Time   CALCIUM 9.6 03/02/2013 0811   CALCIUM 9.2 02/24/2013 1857   ALKPHOS 114 03/02/2013 0811   ALKPHOS 113 02/24/2013 1857   AST 24 03/02/2013 0811   AST 18 02/24/2013 1857   ALT 61* 03/02/2013 0811   ALT 30 02/24/2013 1857   BILITOT 0.33 03/02/2013 0811   BILITOT 0.2* 02/24/2013 1857    ADDITIONAL INFORMATION: 1. PROGNOSTIC INDICATORS - ACIS Results IMMUNOHISTOCHEMICAL AND MORPHOMETRIC ANALYSIS BY THE AUTOMATED CELLULAR IMAGING SYSTEM (ACIS) Estrogen Receptor (Negative, <1%): 0%, NEGATIVE Progesterone Receptor (Negative, <1%): 0%, NEGATIVE COMMENT: The negative hormone receptor study(ies) in this case have an internal positive control. All controls stained appropriately Pecola Leisure  MD Pathologist, Electronic Signature ( Signed 01/07/2013) 1. CHROMOGENIC IN-SITU HYBRIDIZATION Interpretation: HER2/NEU BY CISH - SHOWS AMPLIFICATION BY CISH ANALYSIS. THE RATIO OF HER2: CEP 17 SIGNALS WAS 4.36. Reference range: Ratio: HER2:CEP17 < 1.8 gene amplification not observed Ratio: HER2:CEP 17 1.8-2.2 - equivocal result Ratio: HER2:CEP17 > 2.2 - gene amplification observed 1 of 4 FINAL for TABITA, CORBO ([id number]) ADDITIONAL INFORMATION:(continued) Pecola Leisure MD Pathologist, Electronic Signature ( Signed 01/07/2013) FINAL DIAGNOSIS Diagnosis 1. Breast, lumpectomy, Right - INVASIVE DUCTAL CARCINOMA, GRADE III (1.8 CM), SEE COMMENT. - LYMPHOVASCULAR INVASION IDENTIFIED. - INVASIVE TUMOR IS 1 MM TO NEAREST MARGIN (POSTERIOR). - DUCTAL CARCINOMA IN SITU, GRADE III WITH COMEDO NECROSIS. - IN SITU CARCINOMA IS LESS THAN 0.1 MM FROM ANTERIOR-INFERIOR AND POSTERIOR MARGINS. - SEE TUMOR TEMPLATE BELOW. 2. Lymph node, sentinel, biopsy, Right axillary - ONE LYMPH NODE, NEGATIVE FOR TUMOR (0/1), SEE COMMENT. 3. Lymph node, sentinel, biopsy, Right axillary - ONE LYMPH NODE, NEGATIVE FOR TUMOR (0/1), SEE COMMENT. Microscopic Comment 1. BREAST, INVASIVE TUMOR, WITH LYMPH NODE SAMPLING Specimen, including laterality: Right breast Procedure: Lumpectomy Grade: III of III Tubule formation: II Nuclear pleomorphism: III Mitotic:III Tumor size (gross measurement): 1.8 cm Margins: Invasive, distance to closest margin: 0.6 cm In-situ, distance to closest margin: less than 0.1 cm If margin positive, focally or broadly: N/A Lymphovascular invasion: Present Ductal carcinoma in situ: Present Grade: III of III Extensive intraductal component: Absent Lobular neoplasia: Absent Tumor focality: Unifocal Treatment effect: None If present, treatment effect in breast tissue, lymph nodes or both: N/A Extent of tumor: Skin: N/A Nipple: N/A Skeletal muscle: N/A Lymph nodes: #  examined: 2 Lymph nodes with metastasis: 0 Breast prognostic profile: Estrogen receptor: Repeated, previous study demonstrated 0% positivity ([id number]) 2 of 4 FINAL for VYLETTE, STRUBEL ([id number]) Microscopic Comment(continued) Progesterone receptor: Repeated, previous study demonstrated 0% positivity ([id number]) Her 2 neu: Repeated, previous study demonstrated amplification (3.88) ([id number]) Ki-67: Not repeated, previous study demonstrated 83% proliferation rate ([id number]) Non-neoplastic breast: Previous biopsy site, fibrocystic change and microcalcifications TNM: pT1c pN0 pMX   RADIOGRAPHIC STUDIES:  Mr Breast Bilateral W Wo Contrast  12/23/2012  *RADIOLOGY REPORT*  Clinical Data: recent diagnosis of invasive ductal carcinoma 12 o'clock right breast, for treatment planning  BILATERAL BREAST MRI WITH AND WITHOUT CONTRAST  Technique: Multiplanar, multisequence MR images of both breasts were obtained prior to and following the intravenous administration of 20ml of multihance.  Three dimensional images were evaluated at the independent DynaCad workstation.  Comparison:  mammographic and ultrasound images 12/08/12 and 11/20/12  Findings: There is mild background parenchymal enhancement.  The left breast is negative. There is no evidence of axillary or internal mammary adenopathy.  There is no abnormal skin or nipple enhancement.  On the right, associated with the recently placed biopsy marker clip, there is an enhancing spiculated mass in the 12 o'clock position 11cm from the nipple.  The mass measures 23 x 12mm transverse dimension by 35mm craniocaudal dimension.  1cm anterior and slightly inferior to the mass is a second 7mm mass which is connected to the dominant mass by a thin bridge of enhancement. There are no other abnormalities in the right breast.  IMPRESSION: Biopsy-proven invasive carcinoma in the 12 o'clock position of the right breast, with an additional satellite focus as  described above.  BI-RADS CATEGORY 6:  Known biopsy-proven malignancy - appropriate action should be taken.  THREE-DIMENSIONAL MR IMAGE RENDERING ON INDEPENDENT WORKSTATION:  Three-dimensional MR images were rendered by post-processing of the original MR data on an independent workstation.  The three- dimensional MR images were interpreted, and findings were reported in the accompanying complete MRI report for this study.   Original Report Authenticated By: Esperanza Heir, M.D.    Nm Sentinel Node Inj-no Rpt (breast)  12/31/2012  CLINICAL DATA: right breast cancer   Sulfur colloid was injected intradermally by the nuclear medicine  technologist for breast cancer sentinel node localization.     Dg Chest Port 1 View  12/31/2012  *RADIOLOGY REPORT*  Clinical Data: Port-A-Cath placement.  History of breast cancer.  PORTABLE CHEST - 1 VIEW  Comparison: 03/26/2011.  Findings: Central line has been placed from the left.  The tip is at the level of the distal superior vena cava/caval atrial junction.  No gross pneumothorax.  Consolidation medial aspect of the lung bases may be present possibly representing atelectasis or infiltrate.  Follow-up two- view chest with better inspiration recommended for further delineation.  Heart size top normal.  Mild central pulmonary vascular prominence.  Surgical clips project over the right lower chest and may be related to breast reconstruction/axillary lymph node dissection.  IMPRESSION: Central line has been placed from the left.  The tip is at the level of the distal superior vena cava/caval atrial junction.  No gross pneumothorax.  Consolidation medial aspect of the lung bases may be present possibly representing atelectasis or infiltrate.  Follow-up two- view chest with better inspiration recommended for further delineation.   Original Report Authenticated By: Lacy Duverney, M.D.    Dg Fluoro Guide Cv Line-no Report  12/31/2012  CLINICAL DATA: portacath   FLOURO GUIDE CV LINE   Fluoroscopy was utilized by the requesting physician.  No radiographic  interpretation.      ASSESSMENT: 52 year old female with  #1 new diagnosis of invasive ductal carcinoma of the right breast she is now status post lumpectomy with sentinel lymph node biopsy with the final pathology revealing a 1.8 cm invasive ductal carcinoma grade 3 ER negative PR negative HER-2/neu amplified at 4.36. 2 sentinel nodes were negative for metastatic disease. There was noted to be lymphovascular invasion. Postoperatively patient is doing well. She will go for a reexcision for positive margin.  #2 subsequently begun on adjuvant chemotherapy consisting of Taxotere carboplatinum and Herceptin starting 01/26/2013. Total of 6 cycles of TCH combination is planned.  #3 reflux which she has a chronic history. But it is worsened since starting her chemotherapy.  #4 rash on her scapula.  #5 neuropathy patient was recently  seen in the emergency room with tingling and numbness of her hands and scalp. She was begun on Neurontin. The neuropathy is a little bit better. She currently is on 100 mg 3 times a day of Neurontin.  PLAN:    #1 patient will proceed with her scheduled chemotherapy today.  #2 she will be seen back in one week's time for Herceptin.    All questions were answered. The patient knows to call the clinic with any problems, questions or concerns. We can certainly see the patient much sooner if necessary.  I spent 25 minutes counseling the patient face to face. The total time spent in the appointment was 30 minutes.  Drue Second, MD Medical/Oncology [facility] [phone] (beeper) [phone] (Office)  03/09/2013, 9:48 AM

## 2013-03-09 NOTE — Patient Instructions (Addendum)
Proceed with TCH  I will see you back in 1 week for herceptin

## 2013-03-09 NOTE — Patient Instructions (Addendum)
[facility] Discharge Instructions for Patients Receiving Chemotherapy  Today you received the following chemotherapy agents: taxotere, carboplatin, herceptin  To help prevent nausea and vomiting after your treatment, we encourage you to take your nausea medication.  Take it as often as prescribed.     If you develop nausea and vomiting that is not controlled by your nausea medication, call the clinic. If it is after clinic hours your family physician or the after hours number for the clinic or go to the Emergency Department.   BELOW ARE SYMPTOMS THAT SHOULD BE REPORTED IMMEDIATELY:  *FEVER GREATER THAN 100.5 F  *CHILLS WITH OR WITHOUT FEVER  NAUSEA AND VOMITING THAT IS NOT CONTROLLED WITH YOUR NAUSEA MEDICATION  *UNUSUAL SHORTNESS OF BREATH  *UNUSUAL BRUISING OR BLEEDING  TENDERNESS IN MOUTH AND THROAT WITH OR WITHOUT PRESENCE OF ULCERS  *URINARY PROBLEMS  *BOWEL PROBLEMS  UNUSUAL RASH Items with * indicate a potential emergency and should be followed up as soon as possible.  Feel free to call the clinic you have any questions or concerns. The clinic phone number is [phone].   I have been informed and understand all the instructions given to me. I know to contact the clinic, my physician, or go to the Emergency Department if any problems should occur. I do not have any questions at this time, but understand that I may call the clinic during office hours   should I have any questions or need assistance in obtaining follow up care.    __________________________________________  _____________  __________ Signature of Patient or Authorized Representative            Date                   Time    __________________________________________ Nurse's Signature    

## 2013-03-10 ENCOUNTER — Ambulatory Visit (HOSPITAL_BASED_OUTPATIENT_CLINIC_OR_DEPARTMENT_OTHER): Payer: BC Managed Care – PPO

## 2013-03-10 VITALS — BP 149/95 | HR 91 | Temp 98.3°F

## 2013-03-10 DIAGNOSIS — C50419 Malignant neoplasm of upper-outer quadrant of unspecified female breast: Secondary | ICD-10-CM

## 2013-03-10 DIAGNOSIS — Z5189 Encounter for other specified aftercare: Secondary | ICD-10-CM

## 2013-03-10 MED ORDER — PEGFILGRASTIM INJECTION 6 MG/0.6ML
6.0000 mg | Freq: Once | SUBCUTANEOUS | Status: AC
  Administered 2013-03-10: 6 mg via SUBCUTANEOUS
  Filled 2013-03-10: qty 0.6

## 2013-03-10 NOTE — Progress Notes (Addendum)
Last treatment patient reported allergic reaction to dressing.  Used opsite dressing for that infusion.  Patient reported this treatment that the opsite did not help - she still had an allergic reaction.  She reported bumps around the areas where the dressing and bright red circle approximately 2 inches round around the actual port.  Redness around port does not indicate allergy to dressing as dressing does not touch skin around the needle. Possibly allergy to chlorhexidine?  Patient reported no allergies to iodine.  Cleaned the site with providone-iodine swabs and applied skin protectant prior to dressing with opsite.  Follow up with patient required to verify whether or not she had allergic reaction.

## 2013-03-10 NOTE — ED Provider Notes (Signed)
Medical screening examination/treatment/procedure(s) were conducted as a shared visit with non-physician practitioner(s) and myself.  I personally evaluated the patient during the encounter   Julia Gignac, MD 03/10/13 1556 

## 2013-03-10 NOTE — ED Provider Notes (Signed)
Medical screening examination/treatment/procedure(s) were conducted as a shared visit with non-physician practitioner(s) and myself.  I personally evaluated the patient during the encounter   Loren Racer, MD 03/10/13 1556

## 2013-03-13 ENCOUNTER — Encounter: Payer: Self-pay | Admitting: *Deleted

## 2013-03-16 ENCOUNTER — Ambulatory Visit (HOSPITAL_BASED_OUTPATIENT_CLINIC_OR_DEPARTMENT_OTHER): Payer: BC Managed Care – PPO

## 2013-03-16 ENCOUNTER — Ambulatory Visit (HOSPITAL_BASED_OUTPATIENT_CLINIC_OR_DEPARTMENT_OTHER): Payer: BC Managed Care – PPO | Admitting: Oncology

## 2013-03-16 ENCOUNTER — Other Ambulatory Visit (HOSPITAL_BASED_OUTPATIENT_CLINIC_OR_DEPARTMENT_OTHER): Payer: BC Managed Care – PPO | Admitting: Lab

## 2013-03-16 ENCOUNTER — Encounter: Payer: Self-pay | Admitting: Oncology

## 2013-03-16 VITALS — BP 129/94 | HR 100 | Temp 98.6°F | Resp 20 | Ht 66.0 in | Wt 223.0 lb

## 2013-03-16 DIAGNOSIS — G589 Mononeuropathy, unspecified: Secondary | ICD-10-CM

## 2013-03-16 DIAGNOSIS — C50419 Malignant neoplasm of upper-outer quadrant of unspecified female breast: Secondary | ICD-10-CM

## 2013-03-16 DIAGNOSIS — C50411 Malignant neoplasm of upper-outer quadrant of right female breast: Secondary | ICD-10-CM

## 2013-03-16 DIAGNOSIS — Z5112 Encounter for antineoplastic immunotherapy: Secondary | ICD-10-CM

## 2013-03-16 DIAGNOSIS — Z170 Estrogen receptor positive status [ER+]: Secondary | ICD-10-CM

## 2013-03-16 DIAGNOSIS — R21 Rash and other nonspecific skin eruption: Secondary | ICD-10-CM

## 2013-03-16 LAB — CBC WITH DIFFERENTIAL/PLATELET
BASO%: 0 % (ref 0.0–2.0)
EOS%: 0.7 % (ref 0.0–7.0)
LYMPH%: 38.9 % (ref 14.0–49.7)
MCH: 26.1 pg (ref 25.1–34.0)
MCHC: 32.3 g/dL (ref 31.5–36.0)
MCV: 80.9 fL (ref 79.5–101.0)
MONO%: 22.6 % — ABNORMAL HIGH (ref 0.0–14.0)
Platelets: 106 10*3/uL — ABNORMAL LOW (ref 145–400)
RBC: 4.4 10*6/uL (ref 3.70–5.45)
RDW: 15.6 % — ABNORMAL HIGH (ref 11.2–14.5)
nRBC: 0 % (ref 0–0)

## 2013-03-16 LAB — COMPREHENSIVE METABOLIC PANEL (CC13)
ALT: 60 U/L — ABNORMAL HIGH (ref 0–55)
Alkaline Phosphatase: 116 U/L (ref 40–150)
CO2: 26 mEq/L (ref 22–29)
Creatinine: 0.7 mg/dL (ref 0.6–1.1)
Total Bilirubin: 0.56 mg/dL (ref 0.20–1.20)

## 2013-03-16 MED ORDER — SODIUM CHLORIDE 0.9 % IV SOLN
Freq: Once | INTRAVENOUS | Status: AC
  Administered 2013-03-16: 11:00:00 via INTRAVENOUS

## 2013-03-16 MED ORDER — TRASTUZUMAB CHEMO INJECTION 440 MG
2.0000 mg/kg | Freq: Once | INTRAVENOUS | Status: AC
  Administered 2013-03-16: 210 mg via INTRAVENOUS
  Filled 2013-03-16: qty 10

## 2013-03-16 MED ORDER — DIPHENHYDRAMINE HCL 25 MG PO CAPS
50.0000 mg | ORAL_CAPSULE | Freq: Once | ORAL | Status: AC
  Administered 2013-03-16: 50 mg via ORAL

## 2013-03-16 MED ORDER — HEPARIN SOD (PORK) LOCK FLUSH 100 UNIT/ML IV SOLN
500.0000 [IU] | Freq: Once | INTRAVENOUS | Status: AC | PRN
  Administered 2013-03-16: 500 [IU]
  Filled 2013-03-16: qty 5

## 2013-03-16 MED ORDER — ACETAMINOPHEN 325 MG PO TABS
650.0000 mg | ORAL_TABLET | Freq: Once | ORAL | Status: AC
  Administered 2013-03-16: 650 mg via ORAL

## 2013-03-16 MED ORDER — SODIUM CHLORIDE 0.9 % IJ SOLN
10.0000 mL | INTRAMUSCULAR | Status: DC | PRN
  Administered 2013-03-16: 10 mL
  Filled 2013-03-16: qty 10

## 2013-03-16 NOTE — Progress Notes (Signed)
Pt reported no problems last treatment with dressing.  Chlorhexidine added to patient allergy list.  Port access today -  used betadine to clean, skin protectant and opsite.

## 2013-03-16 NOTE — Patient Instructions (Addendum)
[facility] Discharge Instructions for Patients Receiving Chemotherapy  Today you received the following chemotherapy agents: herceptin  To help prevent nausea and vomiting after your treatment, we encourage you to take your nausea medication.  Take it as often as prescribed.     If you develop nausea and vomiting that is not controlled by your nausea medication, call the clinic. If it is after clinic hours your family physician or the after hours number for the clinic or go to the Emergency Department.   BELOW ARE SYMPTOMS THAT SHOULD BE REPORTED IMMEDIATELY:  *FEVER GREATER THAN 100.5 F  *CHILLS WITH OR WITHOUT FEVER  NAUSEA AND VOMITING THAT IS NOT CONTROLLED WITH YOUR NAUSEA MEDICATION  *UNUSUAL SHORTNESS OF BREATH  *UNUSUAL BRUISING OR BLEEDING  TENDERNESS IN MOUTH AND THROAT WITH OR WITHOUT PRESENCE OF ULCERS  *URINARY PROBLEMS  *BOWEL PROBLEMS  UNUSUAL RASH Items with * indicate a potential emergency and should be followed up as soon as possible.  Feel free to call the clinic you have any questions or concerns. The clinic phone number is [phone].   I have been informed and understand all the instructions given to me. I know to contact the clinic, my physician, or go to the Emergency Department if any problems should occur. I do not have any questions at this time, but understand that I may call the clinic during office hours   should I have any questions or need assistance in obtaining follow up care.    __________________________________________  _____________  __________ Signature of Patient or Authorized Representative            Date                   Time    __________________________________________ Nurse's Signature    

## 2013-03-16 NOTE — Patient Instructions (Addendum)
Use the natural dentist mouth wash from the vitamin store on [facility]  We will proceed with herceptin today  Return in 1 week for Herceptin

## 2013-03-16 NOTE — Progress Notes (Signed)
OFFICE PROGRESS NOTE  CC  Julia Zuniga, Julia Lemon, MD [address] [place] [address] Dr. Almond Lint Dr. Lurline Hare  DIAGNOSIS: 52 year old female with invasive ductal carcinoma of the right breast diagnosed December 2013.  PRIOR THERAPY:  #1 patient was originally seen in the multidisciplinary breast clinic on 12/17/2012 with a screening mammogram that showed an abnormality in the right breast. This abnormality measured 1.6 cm. She had a biopsy performed that showed invasive ductal carcinoma grade 2, ER negative, PR negative, HER-2 positive with a Ki-67 of 80%.  #2 patient is now status post right lumpectomy on 12/31/2012 with the pathology revealing a 8 cm invasive ductal carcinoma grade 3 with lymphovascular invasion, ER negative PR negative HER-2/neu positive with amplification of 4.36, 2 sentinel nodes were negative for metastatic disease. Patient did have positive margins and she will need a reexcision which will be performed on 01/14/2013. Patient has a Port-A-Cath in place.  #3 patient will proceed with adjuvant chemotherapy initially consisting of Taxotere carboplatinum and Herceptin. With Taxotere carboplatinum to be given every 21 days and Herceptin on a weekly basis for a total of 6 cycles of TC.we will plan on starting her first cycle of chemotherapy on 01/26/2013.  CURRENT THERAPY: Here for Herceptin  INTERVAL HISTORY: LASAUNDRA Zuniga 52 y.o. female returns for followup today. Overall patient tolerated the chemotherapy well. However she is having some nausea and bad taste in her mouth. She denies having any fevers chills night sweats headaches no shortness of breath no chest pains no palpitations. Her neuropathy is significantly improved. I do think that reducing the dose of Taxotere has helped her significantly.  MEDICAL HISTORY: Past Medical History  Diagnosis Date  . Breast cancer   . Hyperlipidemia   . Diverticulitis   . Hernia, hiatal   . Heartburn   .  Anxiety   . Hot flashes   . PONV (postoperative nausea and vomiting)     ALLERGIES:  is allergic to codeine; medralone; and oxycodone-acetaminophen.  MEDICATIONS:  Current Outpatient Prescriptions  Medication Sig Dispense Refill  . atorvastatin (LIPITOR) 40 MG tablet Take 40 mg by mouth every evening.      . B Complex-C (SUPER B COMPLEX PO) Take 1 tablet by mouth daily.      . carvedilol (COREG) 6.25 MG tablet Take 1 tablet (6.25 mg total) by mouth 2 (two) times daily with a meal.  60 tablet  6  . dexamethasone (DECADRON) 4 MG tablet Take 2 tablets (8 mg total) by mouth 2 (two) times daily with a meal. Take two times a day the day before Taxotere. Then take two times a day starting the day after chemo for 3 days.  30 tablet  1  . nystatin (MYCOSTATIN) 100000 UNIT/ML suspension Take 5 mLs (500,000 Units total) by mouth 4 (four) times daily.  60 mL  0  . omeprazole (PRILOSEC) 40 MG capsule Take 1 capsule (40 mg total) by mouth daily.  90 capsule  2  . ondansetron (ZOFRAN) 8 MG tablet Take 1 tablet (8 mg total) by mouth 2 (two) times daily. Take two times a day starting the day after chemo for 3 days. Then take two times a day as needed for nausea or vomiting.  30 tablet  1  . UNABLE TO FIND Cranial prosthesis due to chemotherapy induced hair loss.  1 Units  0  . gabapentin (NEURONTIN) 100 MG capsule Take 2 capsules three times a day  180 capsule  6  .  HYDROcodone-acetaminophen (NORCO) 5-325 MG per tablet Take 1-2 tablets by mouth every 6 (six) hours as needed for pain.  15 tablet  1  . ibuprofen (ADVIL,MOTRIN) 200 MG tablet Take 400 mg by mouth every 6 (six) hours as needed. Pain      . Probiotic Product (PROBIOTIC DAILY PO) Take 1 tablet by mouth daily.       . prochlorperazine (COMPAZINE) 25 MG suppository Place 1 suppository (25 mg total) rectally every 12 (twelve) hours as needed for nausea.  12 suppository  3  . sucralfate (CARAFATE) 1 G tablet Take 1 tablet (1 g total) by mouth 4 (four)  times daily.  120 tablet  5   No current facility-administered medications for this visit.    SURGICAL HISTORY:  Past Surgical History  Procedure Laterality Date  . Cesarean section      x 3  . Tubal ligation    . Tonsillectomy and adenoidectomy    . Cholecystectomy  2001    lap choli  . Breast lumpectomy with sentinel lymph node biopsy  12/31/2012    Procedure: BREAST LUMPECTOMY WITH SENTINEL LYMPH NODE BX;  Surgeon: Almond Lint, MD;  Location:  SUR[facility]ERY [facility];  Service: General;  Laterality: Right;  . Portacath placement  12/31/2012    Procedure: INSERTION PORT-A-CATH;  Surgeon: Almond Lint, MD;  Location:  SURGERY CENTER;  Service: General;  Laterality: Left;  Left Subclavian Vein  . Re-excision of breast cancer,superior margins  01/14/2013    Procedure: RE-EXCISION OF BREAST CANCER,SUPERIOR MARGINS;  Surgeon: Almond Lint, MD;  Location: [facility];  Service: General;  Laterality: Right;  right breast re excision for markers     REVIEW OF SYSTEMS:   General: fatigue (+), night sweats (-), fever (-), pain (-) Lymph: palpable nodes (-) HEENT: vision changes (-), mucositis (+), gum bleeding (-), epistaxis (-) Cardiovascular: chest pain (-), palpitations (-) Pulmonary: shortness of breath (-), dyspnea on exertion (-), cough (-), hemoptysis (-) GI:  Early satiety (-), melena (-), dysphagia (-), nausea/vomiting (+), diarrhea (+) GU: dysuria (-), hematuria (-), incontinence (-) Musculoskeletal: joint swelling (-), joint pain (-), back pain (-) Neuro: weakness (-), numbness (+), headache (-), confusion (-) Skin: Rash (-), lesions (-), dryness (-) Psych: depression (-), suicidal/homicidal ideation (-), feeling of hopelessness (-)  PHYSICAL EXAMINATION: Blood pressure 129/94, pulse 100, temperature 98.6 F (37 C), temperature source Oral, resp. rate 20, height 5\' 6"  (1.676 m), weight 223 lb (101.152 kg), last menstrual period 01/07/2009. Body mass index is 36.01  kg/(m^2). General: Patient is a well appearing female in no acute distress HEENT: PERRLA, sclerae anicteric no conjunctival pallor, MMM, erythema and ulcerations on tongue, no white patches Neck: supple, no palpable adenopathy Lungs: clear to auscultation bilaterally, no wheezes, rhonchi, or rales Cardiovascular: regular rate rhythm, S1, S2, no murmurs, rubs or gallops Abdomen: Soft, non-tender, non-distended, normoactive bowel sounds, no HSM Extremities: warm and well perfused, no clubbing, cyanosis, or edema Skin: No rashes or lesions Neuro: Non-focal ECOG PERFORMANCE STATUS: 0 - Asymptomatic Right breast lumpectomy scar healed, no nodularity, left breast, no masses or nipple discharge   LABORATORY DATA: Lab Results  Component Value Date   WBC 5.5 03/16/2013   HGB 11.5* 03/16/2013   HCT 35.6 03/16/2013   MCV 80.9 03/16/2013   PLT 106* 03/16/2013      Chemistry      Component Value Date/Time   NA 139 03/09/2013 0849   NA 135 02/24/2013 1857   K 4.4 03/09/2013 0849  K 3.4* 02/24/2013 1857   CL 105 03/09/2013 0849   CL 97 02/24/2013 1857   CO2 21* 03/09/2013 0849   CO2 29 02/24/2013 1857   BUN 15.7 03/09/2013 0849   BUN 6 02/24/2013 1857   CREATININE 0.7 03/09/2013 0849   CREATININE 0.55 02/24/2013 1857      Component Value Date/Time   CALCIUM 10.0 03/09/2013 0849   CALCIUM 9.2 02/24/2013 1857   ALKPHOS 113 03/09/2013 0849   ALKPHOS 113 02/24/2013 1857   AST 21 03/09/2013 0849   AST 18 02/24/2013 1857   ALT 74* 03/09/2013 0849   ALT 30 02/24/2013 1857   BILITOT 0.31 03/09/2013 0849   BILITOT 0.2* 02/24/2013 1857    ADDITIONAL INFORMATION: 1. PROGNOSTIC INDICATORS - ACIS Results IMMUNOHISTOCHEMICAL AND MORPHOMETRIC ANALYSIS BY THE AUTOMATED CELLULAR IMAGING SYSTEM (ACIS) Estrogen Receptor (Negative, <1%): 0%, NEGATIVE Progesterone Receptor (Negative, <1%): 0%, NEGATIVE COMMENT: The negative hormone receptor study(ies) in this case have an internal positive control. All controls stained  appropriately Pecola Leisure MD Pathologist, Electronic Signature ( Signed 01/07/2013) 1. CHROMOGENIC IN-SITU HYBRIDIZATION Interpretation: HER2/NEU BY CISH - SHOWS AMPLIFICATION BY CISH ANALYSIS. THE RATIO OF HER2: CEP 17 SIGNALS WAS 4.36. Reference range: Ratio: HER2:CEP17 < 1.8 gene amplification not observed Ratio: HER2:CEP 17 1.8-2.2 - equivocal result Ratio: HER2:CEP17 > 2.2 - gene amplification observed 1 of 4 FINAL for ARON, NEEDLES ([id number]) ADDITIONAL INFORMATION:(continued) Pecola Leisure MD Pathologist, Electronic Signature ( Signed 01/07/2013) FINAL DIAGNOSIS Diagnosis 1. Breast, lumpectomy, Right - INVASIVE DUCTAL CARCINOMA, GRADE III (1.8 CM), SEE COMMENT. - LYMPHOVASCULAR INVASION IDENTIFIED. - INVASIVE TUMOR IS 1 MM TO NEAREST MARGIN (POSTERIOR). - DUCTAL CARCINOMA IN SITU, GRADE III WITH COMEDO NECROSIS. - IN SITU CARCINOMA IS LESS THAN 0.1 MM FROM ANTERIOR-INFERIOR AND POSTERIOR MARGINS. - SEE TUMOR TEMPLATE BELOW. 2. Lymph node, sentinel, biopsy, Right axillary - ONE LYMPH NODE, NEGATIVE FOR TUMOR (0/1), SEE COMMENT. 3. Lymph node, sentinel, biopsy, Right axillary - ONE LYMPH NODE, NEGATIVE FOR TUMOR (0/1), SEE COMMENT. Microscopic Comment 1. BREAST, INVASIVE TUMOR, WITH LYMPH NODE SAMPLING Specimen, including laterality: Right breast Procedure: Lumpectomy Grade: III of III Tubule formation: II Nuclear pleomorphism: III Mitotic:III Tumor size (gross measurement): 1.8 cm Margins: Invasive, distance to closest margin: 0.6 cm In-situ, distance to closest margin: less than 0.1 cm If margin positive, focally or broadly: N/A Lymphovascular invasion: Present Ductal carcinoma in situ: Present Grade: III of III Extensive intraductal component: Absent Lobular neoplasia: Absent Tumor focality: Unifocal Treatment effect: None If present, treatment effect in breast tissue, lymph nodes or both: N/A Extent of tumor: Skin: N/A Nipple: N/A Skeletal muscle:  N/A Lymph nodes: # examined: 2 Lymph nodes with metastasis: 0 Breast prognostic profile: Estrogen receptor: Repeated, previous study demonstrated 0% positivity ([id number]) 2 of 4 FINAL for LABRISHA, WUELLNER ([id number]) Microscopic Comment(continued) Progesterone receptor: Repeated, previous study demonstrated 0% positivity ([id number]) Her 2 neu: Repeated, previous study demonstrated amplification (3.88) ([id number]) Ki-67: Not repeated, previous study demonstrated 83% proliferation rate ([id number]) Non-neoplastic breast: Previous biopsy site, fibrocystic change and microcalcifications TNM: pT1c pN0 pMX   RADIOGRAPHIC STUDIES:  Mr Breast Bilateral W Wo Contrast  12/23/2012  *RADIOLOGY REPORT*  Clinical Data: recent diagnosis of invasive ductal carcinoma 12 o'clock right breast, for treatment planning  BILATERAL BREAST MRI WITH AND WITHOUT CONTRAST  Technique: Multiplanar, multisequence MR images of both breasts were obtained prior to and following the intravenous administration of 20ml of multihance.  Three dimensional images were evaluated at the independent DynaCad workstation.  Comparison:  mammographic and ultrasound images 12/08/12 and 11/20/12  Findings: There is mild background parenchymal enhancement.  The left breast is negative. There is no evidence of axillary or internal mammary adenopathy.  There is no abnormal skin or nipple enhancement.  On the right, associated with the recently placed biopsy marker clip, there is an enhancing spiculated mass in the 12 o'clock position 11cm from the nipple.  The mass measures 23 x 12mm transverse dimension by 35mm craniocaudal dimension.  1cm anterior and slightly inferior to the mass is a second 7mm mass which is connected to the dominant mass by a thin bridge of enhancement. There are no other abnormalities in the right breast.  IMPRESSION: Biopsy-proven invasive carcinoma in the 12 o'clock position of the right breast, with an additional  satellite focus as described above.  BI-RADS CATEGORY 6:  Known biopsy-proven malignancy - appropriate action should be taken.  THREE-DIMENSIONAL MR IMAGE RENDERING ON INDEPENDENT WORKSTATION:  Three-dimensional MR images were rendered by post-processing of the original MR data on an independent workstation.  The three- dimensional MR images were interpreted, and findings were reported in the accompanying complete MRI report for this study.   Original Report Authenticated By: Esperanza Heir, M.D.    Nm Sentinel Node Inj-no Rpt (breast)  12/31/2012  CLINICAL DATA: right breast cancer   Sulfur colloid was injected intradermally by the nuclear medicine  technologist for breast cancer sentinel node localization.     Dg Chest Port 1 View  12/31/2012  *RADIOLOGY REPORT*  Clinical Data: Port-A-Cath placement.  History of breast cancer.  PORTABLE CHEST - 1 VIEW  Comparison: 03/26/2011.  Findings: Central line has been placed from the left.  The tip is at the level of the distal superior vena cava/caval atrial junction.  No gross pneumothorax.  Consolidation medial aspect of the lung bases may be present possibly representing atelectasis or infiltrate.  Follow-up two- view chest with better inspiration recommended for further delineation.  Heart size top normal.  Mild central pulmonary vascular prominence.  Surgical clips project over the right lower chest and may be related to breast reconstruction/axillary lymph node dissection.  IMPRESSION: Central line has been placed from the left.  The tip is at the level of the distal superior vena cava/caval atrial junction.  No gross pneumothorax.  Consolidation medial aspect of the lung bases may be present possibly representing atelectasis or infiltrate.  Follow-up two- view chest with better inspiration recommended for further delineation.   Original Report Authenticated By: Lacy Duverney, M.D.    Dg Fluoro Guide Cv Line-no Report  12/31/2012  CLINICAL DATA: portacath    FLOURO GUIDE CV LINE  Fluoroscopy was utilized by the requesting physician.  No radiographic  interpretation.      ASSESSMENT: 52 year old female with  #1 new diagnosis of invasive ductal carcinoma of the right breast she is now status post lumpectomy with sentinel lymph node biopsy with the final pathology revealing a 1.8 cm invasive ductal carcinoma grade 3 ER negative PR negative HER-2/neu amplified at 4.36. 2 sentinel nodes were negative for metastatic disease. There was noted to be lymphovascular invasion. Postoperatively patient is doing well. She will go for a reexcision for positive margin.  #2 subsequently begun on adjuvant chemotherapy consisting of Taxotere carboplatinum and Herceptin starting 01/26/2013. Total of 6 cycles of TCH combination is planned. Her dose of Taxotere was reduced with cycle 3.  #3 reflux which she has a chronic history. But it is worsened since starting her chemotherapy.  #4 rash  on her scapula.  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 The neuropathy is a little bit better. She currently is on 100 mg 3 times a day of Neurontin.  PLAN:   #1 patient will proceed with Herceptin today.  #2 she will return in one week's time for Herceptin alone.  All questions were answered. The patient knows to call the clinic with any problems, questions or concerns. We can certainly see the patient much sooner if necessary.  I spent 25 minutes counseling the patient face to face. The total time spent in the appointment was 30 minutes.  Drue Second, MD Medical/Oncology [facility] [phone] (beeper) [phone] (Office)  03/16/2013, 10:00 AM

## 2013-03-19 ENCOUNTER — Other Ambulatory Visit: Payer: Self-pay | Admitting: Emergency Medicine

## 2013-03-19 MED ORDER — AZITHROMYCIN 250 MG PO TABS: ORAL_TABLET | ORAL | Status: DC

## 2013-03-23 ENCOUNTER — Other Ambulatory Visit (HOSPITAL_BASED_OUTPATIENT_CLINIC_OR_DEPARTMENT_OTHER): Payer: BC Managed Care – PPO | Admitting: Lab

## 2013-03-23 ENCOUNTER — Other Ambulatory Visit: Payer: BC Managed Care – PPO | Admitting: Lab

## 2013-03-23 ENCOUNTER — Encounter: Payer: Self-pay | Admitting: Adult Health

## 2013-03-23 ENCOUNTER — Telehealth: Payer: Self-pay | Admitting: *Deleted

## 2013-03-23 ENCOUNTER — Telehealth: Payer: Self-pay | Admitting: Oncology

## 2013-03-23 ENCOUNTER — Ambulatory Visit (HOSPITAL_BASED_OUTPATIENT_CLINIC_OR_DEPARTMENT_OTHER): Payer: BC Managed Care – PPO

## 2013-03-23 ENCOUNTER — Ambulatory Visit: Payer: BC Managed Care – PPO | Admitting: Adult Health

## 2013-03-23 ENCOUNTER — Ambulatory Visit (HOSPITAL_BASED_OUTPATIENT_CLINIC_OR_DEPARTMENT_OTHER): Payer: BC Managed Care – PPO | Admitting: Adult Health

## 2013-03-23 ENCOUNTER — Ambulatory Visit: Payer: BC Managed Care – PPO

## 2013-03-23 VITALS — BP 139/86 | HR 87 | Temp 98.8°F | Resp 20 | Ht 66.0 in | Wt 231.1 lb

## 2013-03-23 DIAGNOSIS — C50411 Malignant neoplasm of upper-outer quadrant of right female breast: Secondary | ICD-10-CM

## 2013-03-23 DIAGNOSIS — C50419 Malignant neoplasm of upper-outer quadrant of unspecified female breast: Secondary | ICD-10-CM

## 2013-03-23 DIAGNOSIS — K1379 Other lesions of oral mucosa: Secondary | ICD-10-CM

## 2013-03-23 DIAGNOSIS — G589 Mononeuropathy, unspecified: Secondary | ICD-10-CM

## 2013-03-23 DIAGNOSIS — C50919 Malignant neoplasm of unspecified site of unspecified female breast: Secondary | ICD-10-CM

## 2013-03-23 DIAGNOSIS — K219 Gastro-esophageal reflux disease without esophagitis: Secondary | ICD-10-CM

## 2013-03-23 DIAGNOSIS — K1370 Unspecified lesions of oral mucosa: Secondary | ICD-10-CM

## 2013-03-23 DIAGNOSIS — Z5112 Encounter for antineoplastic immunotherapy: Secondary | ICD-10-CM

## 2013-03-23 LAB — CBC WITH DIFFERENTIAL/PLATELET
Basophils Absolute: 0 10*3/uL (ref 0.0–0.1)
EOS%: 0 % (ref 0.0–7.0)
HCT: 28.2 % — ABNORMAL LOW (ref 34.8–46.6)
HGB: 8.9 g/dL — ABNORMAL LOW (ref 11.6–15.9)
LYMPH%: 32.2 % (ref 14.0–49.7)
MCH: 25.9 pg (ref 25.1–34.0)
MCV: 82 fL (ref 79.5–101.0)
MONO%: 5.4 % (ref 0.0–14.0)
NEUT%: 61.9 % (ref 38.4–76.8)

## 2013-03-23 LAB — COMPREHENSIVE METABOLIC PANEL (CC13)
Albumin: 3.7 g/dL (ref 3.5–5.0)
Alkaline Phosphatase: 112 U/L (ref 40–150)
BUN: 6.3 mg/dL — ABNORMAL LOW (ref 7.0–26.0)
Creatinine: 0.6 mg/dL (ref 0.6–1.1)
Glucose: 90 mg/dl (ref 70–99)
Total Bilirubin: 0.54 mg/dL (ref 0.20–1.20)

## 2013-03-23 MED ORDER — SODIUM CHLORIDE 0.9 % IV SOLN
Freq: Once | INTRAVENOUS | Status: AC
  Administered 2013-03-23: 12:00:00 via INTRAVENOUS

## 2013-03-23 MED ORDER — DIPHENHYDRAMINE HCL 25 MG PO CAPS: 50.0000 mg | ORAL_CAPSULE | Freq: Once | ORAL | Status: DC

## 2013-03-23 MED ORDER — VALACYCLOVIR HCL 500 MG PO TABS: 500.0000 mg | ORAL_TABLET | Freq: Two times a day (BID) | ORAL | Status: DC

## 2013-03-23 MED ORDER — ACETAMINOPHEN 325 MG PO TABS
650.0000 mg | ORAL_TABLET | Freq: Once | ORAL | Status: AC
  Administered 2013-03-23: 650 mg via ORAL

## 2013-03-23 MED ORDER — HEPARIN SOD (PORK) LOCK FLUSH 100 UNIT/ML IV SOLN
500.0000 [IU] | Freq: Once | INTRAVENOUS | Status: AC | PRN
  Administered 2013-03-23: 500 [IU]
  Filled 2013-03-23: qty 5

## 2013-03-23 MED ORDER — SODIUM CHLORIDE 0.9 % IJ SOLN
10.0000 mL | INTRAMUSCULAR | Status: DC | PRN
  Administered 2013-03-23: 10 mL
  Filled 2013-03-23: qty 10

## 2013-03-23 MED ORDER — TRASTUZUMAB CHEMO INJECTION 440 MG
2.0000 mg/kg | Freq: Once | INTRAVENOUS | Status: AC
  Administered 2013-03-23: 210 mg via INTRAVENOUS
  Filled 2013-03-23: qty 10

## 2013-03-23 NOTE — Patient Instructions (Signed)
Valacyclovir caplets What is this medicine? VALACYCLOVIR (val ay SYE kloe veer) is an antiviral medicine. It is used to treat or prevent infections caused by certain kinds of viruses. Examples of these infections include herpes and shingles. This medicine will not cure herpes. This medicine may be used for other purposes; ask your health care provider or pharmacist if you have questions. What should I tell my health care provider before I take this medicine? They need to know if you have any of these conditions: -acquired immunodeficiency syndrome (AIDS) -any other condition that may weaken the immune system -bone marrow or kidney transplant -kidney disease -an unusual or allergic reaction to valacyclovir, acyclovir, ganciclovir, valganciclovir, other medicines, foods, dyes, or preservatives -pregnant or trying to get pregnant -breast-feeding How should I use this medicine? Take this medicine by mouth with a glass of water. Follow the directions on the prescription label. You can take this medicine with or without food. Take your doses at regular intervals. Do not take your medicine more often than directed. Finish the full course prescribed by your doctor or health care professional even if you think your condition is better. Do not stop taking except on the advice of your doctor or health care professional. Talk to your pediatrician regarding the use of this medicine in children. While this drug may be prescribed for children as young as 2 years for selected conditions, precautions do apply. Overdosage: If you think you have taken too much of this medicine contact a poison control center or emergency room at once. NOTE: This medicine is only for you. Do not share this medicine with others. What if I miss a dose? If you miss a dose, take it as soon as you can. If it is almost time for your next dose, take only that dose. Do not take double or extra doses. What may interact with this  medicine? -cimetidine -probenecid This list may not describe all possible interactions. Give your health care provider a list of all the medicines, herbs, non-prescription drugs, or dietary supplements you use. Also tell them if you smoke, drink alcohol, or use illegal drugs. Some items may interact with your medicine. What should I watch for while using this medicine? Tell your doctor or health care professional if your symptoms do not start to get better after 1 week. This medicine works best when taken early in the course of an infection, within the first 72 hours. Begin treatment as soon as possible after the first signs of infection like tingling, itching, or pain in the affected area. It is possible that genital herpes may still be spread even when you are not having symptoms. Always use safer sex practices like condoms made of latex or polyurethane whenever you have sexual contact. You should stay well hydrated while taking this medicine. Drink plenty of fluids. What side effects may I notice from receiving this medicine? Side effects that you should report to your doctor or health care professional as soon as possible: -allergic reactions like skin rash, itching or hives, swelling of the face, lips, or tongue -aggressive behavior -confusion -hallucinations -problems with balance, talking, walking -stomach pain -tremor -trouble passing urine or change in the amount of urine Side effects that usually do not require medical attention (report to your doctor or health care professional if they continue or are bothersome): -dizziness -headache -nausea, vomiting This list may not describe all possible side effects. Call your doctor for medical advice about side effects. You may report side effects  to FDA at 1-800-FDA-1088. Where should I keep my medicine? Keep out of the reach of children. Store at room temperature between 15 and 25 degrees C (59 and 77 degrees F). Keep container tightly  closed. Throw away any unused medicine after the expiration date. NOTE: This sheet is a summary. It may not cover all possible information. If you have questions about this medicine, talk to your doctor, pharmacist, or health care provider.  2013, [employer]. (02/02/2008 6:10:08 PM)

## 2013-03-23 NOTE — Progress Notes (Signed)
OFFICE PROGRESS NOTE  CC  DOOLITTLE, Harrel Lemon, MD [address] [place] [address] Dr. Almond Lint Dr. Lurline Hare  DIAGNOSIS: 52 year old female with invasive ductal carcinoma of the right breast diagnosed December 2013.  PRIOR THERAPY:  #1 patient was originally seen in the multidisciplinary breast clinic on 12/17/2012 with a screening mammogram that showed an abnormality in the right breast. This abnormality measured 1.6 cm. She had a biopsy performed that showed invasive ductal carcinoma grade 2, ER negative, PR negative, HER-2 positive with a Ki-67 of 80%.  #2 patient is now status post right lumpectomy on 12/31/2012 with the pathology revealing a 8 cm invasive ductal carcinoma grade 3 with lymphovascular invasion, ER negative PR negative HER-2/neu positive with amplification of 4.36, 2 sentinel nodes were negative for metastatic disease. Patient did have positive margins and she will need a reexcision which will be performed on 01/14/2013. Patient has a Port-A-Cath in place.  #3 patient will proceed with adjuvant chemotherapy initially consisting of Taxotere carboplatinum and Herceptin. With Taxotere carboplatinum to be given every 21 days and Herceptin on a weekly basis for a total of 6 cycles of TC.we will plan on starting her first cycle of chemotherapy on 01/26/2013.  CURRENT THERAPY: TCH cycle 3 day 15 with weekly herceptin  INTERVAL HISTORY: Julia Zuniga 52 y.o. female returns for followup today. She lost her voice last Sunday.  She said she came into her appt last week with no voice, finally she called on Thursday and was prescribed a Zpak.  She believes its the chemotherapy.  She denies cough, nasal drainage, fevers, chills, or shortness of breath.  She continues to have tiny ulcers on her tongue.  She has an appt with Dr. Gala Romney on 4/17.  Otherwise, a 10 point ROS is neg.  MEDICAL HISTORY: Past Medical History  Diagnosis Date  . Breast cancer   .  Hyperlipidemia   . Diverticulitis   . Hernia, hiatal   . Heartburn   . Anxiety   . Hot flashes   . PONV (postoperative nausea and vomiting)     ALLERGIES:  is allergic to chlorhexidine; codeine; medralone; and oxycodone-acetaminophen.  MEDICATIONS:  Current Outpatient Prescriptions  Medication Sig Dispense Refill  . atorvastatin (LIPITOR) 40 MG tablet Take 40 mg by mouth every evening.      Marland Kitchen azithromycin (ZITHROMAX Z-PAK) 250 MG tablet Take 2 tablets ( 500mg  ) on day one, then take 1 tablet 250mg  one days 1-4.  6 each  0  . B Complex-C (SUPER B COMPLEX PO) Take 1 tablet by mouth daily.      . carvedilol (COREG) 6.25 MG tablet Take 1 tablet (6.25 mg total) by mouth 2 (two) times daily with a meal.  60 tablet  6  . gabapentin (NEURONTIN) 100 MG capsule Take 2 capsules three times a day  180 capsule  6  . ibuprofen (ADVIL,MOTRIN) 200 MG tablet Take 400 mg by mouth every 6 (six) hours as needed. Pain      . omeprazole (PRILOSEC) 40 MG capsule Take 1 capsule (40 mg total) by mouth daily.  90 capsule  2  . UNABLE TO FIND Cranial prosthesis due to chemotherapy induced hair loss.  1 Units  0  . dexamethasone (DECADRON) 4 MG tablet Take 2 tablets (8 mg total) by mouth 2 (two) times daily with a meal. Take two times a day the day before Taxotere. Then take two times a day starting the day after chemo for 3 days.  30 tablet  1  . HYDROcodone-acetaminophen (NORCO) 5-325 MG per tablet Take 1-2 tablets by mouth every 6 (six) hours as needed for pain.  15 tablet  1  . nystatin (MYCOSTATIN) 100000 UNIT/ML suspension Take 5 mLs (500,000 Units total) by mouth 4 (four) times daily.  60 mL  0  . ondansetron (ZOFRAN) 8 MG tablet Take 1 tablet (8 mg total) by mouth 2 (two) times daily. Take two times a day starting the day after chemo for 3 days. Then take two times a day as needed for nausea or vomiting.  30 tablet  1  . Probiotic Product (PROBIOTIC DAILY PO) Take 1 tablet by mouth daily.       .  prochlorperazine (COMPAZINE) 25 MG suppository Place 1 suppository (25 mg total) rectally every 12 (twelve) hours as needed for nausea.  12 suppository  3  . sucralfate (CARAFATE) 1 G tablet Take 1 tablet (1 g total) by mouth 4 (four) times daily.  120 tablet  5  . valACYclovir (VALTREX) 500 MG tablet Take 1 tablet (500 mg total) by mouth 2 (two) times daily.  60 tablet  2   No current facility-administered medications for this visit.   Facility-Administered Medications Ordered in Other Visits  Medication Dose Route Frequency Provider Last Rate Last Dose  . diphenhydrAMINE (BENADRYL) capsule 50 mg  50 mg Oral Once Victorino December, MD      . heparin lock flush 100 unit/mL  500 Units Intracatheter Once PRN Victorino December, MD      . sodium chloride 0.9 % injection 10 mL  10 mL Intracatheter PRN Victorino December, MD      . trastuzumab (HERCEPTIN) 210 mg in sodium chloride 0.9 % 250 mL chemo infusion  2 mg/kg (Treatment Plan Actual) Intravenous Once Victorino December, MD        SURGICAL HISTORY:  Past Surgical History  Procedure Laterality Date  . Cesarean section      x 3  . Tubal ligation    . Tonsillectomy and adenoidectomy    . Cholecystectomy  2001    lap choli  . Breast lumpectomy with sentinel lymph node biopsy  12/31/2012    Procedure: BREAST LUMPECTOMY WITH SENTINEL LYMPH NODE BX;  Surgeon: Almond Lint, MD;  Location: [facility];  Service: General;  Laterality: Right;  . Portacath placement  12/31/2012    Procedure: INSERTION PORT-A-CATH;  Surgeon: Almond Lint, MD;  Location: [facility];  Service: General;  Laterality: Left;  Left Subclavian Vein  . Re-excision of breast cancer,superior margins  01/14/2013    Procedure: RE-EXCISION OF BREAST CANCER,SUPERIOR MARGINS;  Surgeon: Almond Lint, MD;  Location: [facility];  Service: General;  Laterality: Right;  right breast re excision for markers     REVIEW OF SYSTEMS:   General: fatigue (+), night sweats (-), fever  (-), pain (-) Lymph: palpable nodes (-) HEENT: vision changes (-), mucositis (+), gum bleeding (-), epistaxis (-) Cardiovascular: chest pain (-), palpitations (-) Pulmonary: shortness of breath (-), dyspnea on exertion (-), cough (-), hemoptysis (-) GI:  Early satiety (-), melena (-), dysphagia (-), nausea/vomiting (+), diarrhea (+) GU: dysuria (-), hematuria (-), incontinence (-) Musculoskeletal: joint swelling (-), joint pain (-), back pain (-) Neuro: weakness (-), numbness (+), headache (-), confusion (-) Skin: Rash (-), lesions (-), dryness (-) Psych: depression (-), suicidal/homicidal ideation (-), feeling of hopelessness (-)  PHYSICAL EXAMINATION: Blood pressure 139/86, pulse 87, temperature 98.8 F (37.1 C),  temperature source Oral, resp. rate 20, height 5\' 6"  (1.676 m), weight 231 lb 1.6 oz (104.826 kg), last menstrual period 01/07/2009. Body mass index is 37.32 kg/(m^2). General: Patient is a well appearing female in no acute distress HEENT: PERRLA, sclerae anicteric no conjunctival pallor, MMM, erythema and ulcerations on tongue, no white patches Neck: supple, no palpable adenopathy Lungs: clear to auscultation bilaterally, no wheezes, rhonchi, or rales Cardiovascular: regular rate rhythm, S1, S2, no murmurs, rubs or gallops Abdomen: Soft, non-tender, non-distended, normoactive bowel sounds, no HSM Extremities: warm and well perfused, no clubbing, cyanosis, or edema Skin: No rashes or lesions Neuro: Non-focal ECOG PERFORMANCE STATUS: 0 - Asymptomatic Right breast lumpectomy scar healed, no nodularity, left breast, no masses or nipple discharge   LABORATORY DATA: Lab Results  Component Value Date   WBC 7.9 03/23/2013   HGB 8.9* 03/23/2013   HCT 28.2* 03/23/2013   MCV 82.0 03/23/2013   PLT 114* 03/23/2013      Chemistry      Component Value Date/Time   NA 135* 03/16/2013 0913   NA 135 02/24/2013 1857   K 3.6 03/16/2013 0913   K 3.4* 02/24/2013 1857   CL 98 03/16/2013 0913    CL 97 02/24/2013 1857   CO2 26 03/16/2013 0913   CO2 29 02/24/2013 1857   BUN 13.9 03/16/2013 0913   BUN 6 02/24/2013 1857   CREATININE 0.7 03/16/2013 0913   CREATININE 0.55 02/24/2013 1857      Component Value Date/Time   CALCIUM 9.6 03/16/2013 0913   CALCIUM 9.2 02/24/2013 1857   ALKPHOS 116 03/16/2013 0913   ALKPHOS 113 02/24/2013 1857   AST 19 03/16/2013 0913   AST 18 02/24/2013 1857   ALT 60* 03/16/2013 0913   ALT 30 02/24/2013 1857   BILITOT 0.56 03/16/2013 0913   BILITOT 0.2* 02/24/2013 1857    ADDITIONAL INFORMATION: 1. PROGNOSTIC INDICATORS - ACIS Results IMMUNOHISTOCHEMICAL AND MORPHOMETRIC ANALYSIS BY THE AUTOMATED CELLULAR IMAGING SYSTEM (ACIS) Estrogen Receptor (Negative, <1%): 0%, NEGATIVE Progesterone Receptor (Negative, <1%): 0%, NEGATIVE COMMENT: The negative hormone receptor study(ies) in this case have an internal positive control. All controls stained appropriately Pecola Leisure MD Pathologist, Electronic Signature ( Signed 01/07/2013) 1. CHROMOGENIC IN-SITU HYBRIDIZATION Interpretation: HER2/NEU BY CISH - SHOWS AMPLIFICATION BY CISH ANALYSIS. THE RATIO OF HER2: CEP 17 SIGNALS WAS 4.36. Reference range: Ratio: HER2:CEP17 < 1.8 gene amplification not observed Ratio: HER2:CEP 17 1.8-2.2 - equivocal result Ratio: HER2:CEP17 > 2.2 - gene amplification observed 1 of 4 FINAL for Julia Zuniga, Julia Zuniga ([id number]) ADDITIONAL INFORMATION:(continued) Pecola Leisure MD Pathologist, Electronic Signature ( Signed 01/07/2013) FINAL DIAGNOSIS Diagnosis 1. Breast, lumpectomy, Right - INVASIVE DUCTAL CARCINOMA, GRADE III (1.8 CM), SEE COMMENT. - LYMPHOVASCULAR INVASION IDENTIFIED. - INVASIVE TUMOR IS 1 MM TO NEAREST MARGIN (POSTERIOR). - DUCTAL CARCINOMA IN SITU, GRADE III WITH COMEDO NECROSIS. - IN SITU CARCINOMA IS LESS THAN 0.1 MM FROM ANTERIOR-INFERIOR AND POSTERIOR MARGINS. - SEE TUMOR TEMPLATE BELOW. 2. Lymph node, sentinel, biopsy, Right axillary - ONE LYMPH NODE, NEGATIVE FOR TUMOR  (0/1), SEE COMMENT. 3. Lymph node, sentinel, biopsy, Right axillary - ONE LYMPH NODE, NEGATIVE FOR TUMOR (0/1), SEE COMMENT. Microscopic Comment 1. BREAST, INVASIVE TUMOR, WITH LYMPH NODE SAMPLING Specimen, including laterality: Right breast Procedure: Lumpectomy Grade: III of III Tubule formation: II Nuclear pleomorphism: III Mitotic:III Tumor size (gross measurement): 1.8 cm Margins: Invasive, distance to closest margin: 0.6 cm In-situ, distance to closest margin: less than 0.1 cm If margin positive, focally or broadly: N/A Lymphovascular  invasion: Present Ductal carcinoma in situ: Present Grade: III of III Extensive intraductal component: Absent Lobular neoplasia: Absent Tumor focality: Unifocal Treatment effect: None If present, treatment effect in breast tissue, lymph nodes or both: N/A Extent of tumor: Skin: N/A Nipple: N/A Skeletal muscle: N/A Lymph nodes: # examined: 2 Lymph nodes with metastasis: 0 Breast prognostic profile: Estrogen receptor: Repeated, previous study demonstrated 0% positivity ([id number]) 2 of 4 FINAL for Julia Zuniga, Julia Zuniga ([id number]) Microscopic Comment(continued) Progesterone receptor: Repeated, previous study demonstrated 0% positivity ([id number]) Her 2 neu: Repeated, previous study demonstrated amplification (3.88) ([id number]) Ki-67: Not repeated, previous study demonstrated 83% proliferation rate ([id number]) Non-neoplastic breast: Previous biopsy site, fibrocystic change and microcalcifications TNM: pT1c pN0 pMX   RADIOGRAPHIC STUDIES:  Mr Breast Bilateral W Wo Contrast  12/23/2012  *RADIOLOGY REPORT*  Clinical Data: recent diagnosis of invasive ductal carcinoma 12 o'clock right breast, for treatment planning  BILATERAL BREAST MRI WITH AND WITHOUT CONTRAST  Technique: Multiplanar, multisequence MR images of both breasts were obtained prior to and following the intravenous administration of 20ml of multihance.  Three dimensional  images were evaluated at the independent DynaCad workstation.  Comparison:  mammographic and ultrasound images 12/08/12 and 11/20/12  Findings: There is mild background parenchymal enhancement.  The left breast is negative. There is no evidence of axillary or internal mammary adenopathy.  There is no abnormal skin or nipple enhancement.  On the right, associated with the recently placed biopsy marker clip, there is an enhancing spiculated mass in the 12 o'clock position 11cm from the nipple.  The mass measures 23 x 12mm transverse dimension by 35mm craniocaudal dimension.  1cm anterior and slightly inferior to the mass is a second 7mm mass which is connected to the dominant mass by a thin bridge of enhancement. There are no other abnormalities in the right breast.  IMPRESSION: Biopsy-proven invasive carcinoma in the 12 o'clock position of the right breast, with an additional satellite focus as described above.  BI-RADS CATEGORY 6:  Known biopsy-proven malignancy - appropriate action should be taken.  THREE-DIMENSIONAL MR IMAGE RENDERING ON INDEPENDENT WORKSTATION:  Three-dimensional MR images were rendered by post-processing of the original MR data on an independent workstation.  The three- dimensional MR images were interpreted, and findings were reported in the accompanying complete MRI report for this study.   Original Report Authenticated By: Esperanza Heir, M.D.    Nm Sentinel Node Inj-no Rpt (breast)  12/31/2012  CLINICAL DATA: right breast cancer   Sulfur colloid was injected intradermally by the nuclear medicine  technologist for breast cancer sentinel node localization.     Dg Chest Port 1 View  12/31/2012  *RADIOLOGY REPORT*  Clinical Data: Port-A-Cath placement.  History of breast cancer.  PORTABLE CHEST - 1 VIEW  Comparison: 03/26/2011.  Findings: Central line has been placed from the left.  The tip is at the level of the distal superior vena cava/caval atrial junction.  No gross pneumothorax.   Consolidation medial aspect of the lung bases may be present possibly representing atelectasis or infiltrate.  Follow-up two- view chest with better inspiration recommended for further delineation.  Heart size top normal.  Mild central pulmonary vascular prominence.  Surgical clips project over the right lower chest and may be related to breast reconstruction/axillary lymph node dissection.  IMPRESSION: Central line has been placed from the left.  The tip is at the level of the distal superior vena cava/caval atrial junction.  No gross pneumothorax.  Consolidation medial aspect of the lung bases may  be present possibly representing atelectasis or infiltrate.  Follow-up two- view chest with better inspiration recommended for further delineation.   Original Report Authenticated By: Lacy Duverney, M.D.    Dg Fluoro Guide Cv Line-no Report  12/31/2012  CLINICAL DATA: portacath   FLOURO GUIDE CV LINE  Fluoroscopy was utilized by the requesting physician.  No radiographic  interpretation.      ASSESSMENT: 52 year old female with  #1 new diagnosis of invasive ductal carcinoma of the right breast she is now status post lumpectomy with sentinel lymph node biopsy with the final pathology revealing a 1.8 cm invasive ductal carcinoma grade 3 ER negative PR negative HER-2/neu amplified at 4.36. 2 sentinel nodes were negative for metastatic disease. There was noted to be lymphovascular invasion. Postoperatively patient is doing well. She will go for a reexcision for positive margin.  #2 subsequently begun on adjuvant chemotherapy consisting of Taxotere carboplatinum and Herceptin starting 01/26/2013. Total of 6 cycles of TCH combination is planned. Her dose of Taxotere was reduced with cycle 3.  #3 reflux which she has a chronic history. But it is worsened since starting her chemotherapy.  #4 rash on her scapula.  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 The neuropathy is a little bit better. She currently is on  100 mg 3 times a day of Neurontin.  PLAN:   #1 Doing well, labs look good.  Proceed with Herceptin.  She will finish up the Zpak tonight.  She will give the medications a few more days to work, but will call me at the end of the week if her voice is not better.    #2 she will return in one week's time for her next cycle of TCH.  #3 I prescribed Valtrex BID for her continued oral ulcers.    All questions were answered. The patient knows to call the clinic with any problems, questions or concerns. We can certainly see the patient much sooner if necessary.  I spent 25 minutes counseling the patient face to face. The total time spent in the appointment was 30 minutes.  Cherie Ouch Lyn Hollingshead, NP Medical Oncology [facility] Phone: [phone] 03/23/2013, 1:21 PM

## 2013-03-23 NOTE — Patient Instructions (Addendum)
[facility] Discharge Instructions for Patients Receiving Chemotherapy  Today you received the following chemotherapy agents Herceptin.  To help prevent nausea and vomiting after your treatment, we encourage you to take your nausea medication as prescribed.    If you develop nausea and vomiting that is not controlled by your nausea medication, call the clinic. If it is after clinic hours your family physician or the after hours number for the clinic or go to the Emergency Department.   BELOW ARE SYMPTOMS THAT SHOULD BE REPORTED IMMEDIATELY:  *FEVER GREATER THAN 100.5 F  *CHILLS WITH OR WITHOUT FEVER  NAUSEA AND VOMITING THAT IS NOT CONTROLLED WITH YOUR NAUSEA MEDICATION  *UNUSUAL SHORTNESS OF BREATH  *UNUSUAL BRUISING OR BLEEDING  TENDERNESS IN MOUTH AND THROAT WITH OR WITHOUT PRESENCE OF ULCERS  *URINARY PROBLEMS  *BOWEL PROBLEMS  UNUSUAL RASH  Please let the nurse know about any problems that you may have experienced. Feel free to call the clinic you have any questions or concerns. The clinic phone number is [phone].   I have been informed and understand all the instructions given to me. I know to contact the clinic, my physician, or go to the Emergency Department if any problems should occur. I do not have any questions at this time, but understand that I may call the clinic during office hours   should I have any questions or need assistance in obtaining follow up care.    __________________________________________  _____________  __________ Signature of Patient or Authorized Representative            Date                   Time    __________________________________________ Nurse's Signature

## 2013-03-23 NOTE — Telephone Encounter (Signed)
Per scheduler I have adjusted appts.   JMW  

## 2013-03-23 NOTE — Telephone Encounter (Signed)
Per staff message and POF I have scheduled appts.  JMW  

## 2013-03-26 ENCOUNTER — Ambulatory Visit (HOSPITAL_COMMUNITY)
Admission: RE | Admit: 2013-03-26 | Discharge: 2013-03-26 | Disposition: A | Discharge status: Home / Self Care | Payer: BC Managed Care – PPO | Source: Ambulatory Visit | Attending: Internal Medicine | Admitting: Internal Medicine

## 2013-03-26 ENCOUNTER — Encounter (HOSPITAL_COMMUNITY): Payer: Self-pay

## 2013-03-26 ENCOUNTER — Ambulatory Visit (HOSPITAL_BASED_OUTPATIENT_CLINIC_OR_DEPARTMENT_OTHER)
Admission: RE | Admit: 2013-03-26 | Discharge: 2013-03-26 | Disposition: A | Discharge status: Home / Self Care | Payer: BC Managed Care – PPO | Source: Ambulatory Visit | Attending: Internal Medicine | Admitting: Internal Medicine

## 2013-03-26 VITALS — BP 118/72 | HR 76 | Wt 232.8 lb

## 2013-03-26 DIAGNOSIS — C50919 Malignant neoplasm of unspecified site of unspecified female breast: Secondary | ICD-10-CM | POA: Insufficient documentation

## 2013-03-26 DIAGNOSIS — Z8249 Family history of ischemic heart disease and other diseases of the circulatory system: Secondary | ICD-10-CM | POA: Insufficient documentation

## 2013-03-26 DIAGNOSIS — C50419 Malignant neoplasm of upper-outer quadrant of unspecified female breast: Secondary | ICD-10-CM

## 2013-03-26 DIAGNOSIS — Z01810 Encounter for preprocedural cardiovascular examination: Secondary | ICD-10-CM | POA: Insufficient documentation

## 2013-03-26 DIAGNOSIS — Z79899 Other long term (current) drug therapy: Secondary | ICD-10-CM | POA: Insufficient documentation

## 2013-03-26 DIAGNOSIS — E785 Hyperlipidemia, unspecified: Secondary | ICD-10-CM | POA: Insufficient documentation

## 2013-03-26 DIAGNOSIS — K449 Diaphragmatic hernia without obstruction or gangrene: Secondary | ICD-10-CM | POA: Insufficient documentation

## 2013-03-26 DIAGNOSIS — F411 Generalized anxiety disorder: Secondary | ICD-10-CM | POA: Insufficient documentation

## 2013-03-26 DIAGNOSIS — I517 Cardiomegaly: Secondary | ICD-10-CM

## 2013-03-26 DIAGNOSIS — I10 Essential (primary) hypertension: Secondary | ICD-10-CM

## 2013-03-26 NOTE — Progress Notes (Signed)
Referring Physician: Dr. Welton Flakes Primary Care: Dr. Ellamae Sia Primary Cardiologist: new  HPI: Julia Zuniga ia a 52 y.o. 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 HER-2/neu was amplified at 3.88.  S/p rt lumpectomy on 12/31/12.  +family history of CAD.  She has been referred to the cardio-oncology treatment by Dr. Welton Flakes for follow up while receiving chemotherapy consisting of taxotere, carboplatinum and herceptin.  She will follow up with Dr. Welton Flakes on 01/08/13 to discuss start dates for chemotherapy.  She knows she will get herceptin for a year.    Echos: 01/06/13: EF 55-60%, lat s' 9.2 03/26/13: EF 55-60% lat s' 9.8  She returns for follow up today.  She is feeling better.  She is finishing up zpak for cough and losing her voice.  Her voice is coming back.  She denies fever/chills.  +neuropathy from chemo, on neurontin.  She denies dyspnea, orthopnea or PND.  No chest pain.   Review of Systems: All pertinent positives and negatives as in HPI, otherwise negative.   Past Medical History  Diagnosis Date  . Breast cancer   . Hyperlipidemia   . Diverticulitis   . Hernia, hiatal   . Heartburn   . Anxiety   . Hot flashes   . PONV (postoperative nausea and vomiting)     Current Outpatient Prescriptions  Medication Sig Dispense Refill  . atorvastatin (LIPITOR) 40 MG tablet Take 40 mg by mouth every evening.      . carvedilol (COREG) 6.25 MG tablet Take 1 tablet (6.25 mg total) by mouth 2 (two) times daily with a meal.  60 tablet  6  . gabapentin (NEURONTIN) 100 MG capsule Take 2 capsules three times a day  180 capsule  6  . ibuprofen (ADVIL,MOTRIN) 200 MG tablet Take 400 mg by mouth every 6 (six) hours as needed. Pain      . azithromycin (ZITHROMAX Z-PAK) 250 MG tablet Take 2 tablets ( 500mg  ) on day one, then take 1 tablet 250mg  one  days 1-4.  6 each  0  . B Complex-C (SUPER B COMPLEX PO) Take 1 tablet by mouth daily.      Marland Kitchen dexamethasone (DECADRON) 4 MG tablet Take 2 tablets (8 mg total) by mouth 2 (two) times daily with a meal. Take two times a day the day before Taxotere. Then take two times a day starting the day after chemo for 3 days.  30 tablet  1  . HYDROcodone-acetaminophen (NORCO) 5-325 MG per tablet Take 1-2 tablets by mouth every 6 (six) hours as needed for pain.  15 tablet  1  . nystatin (MYCOSTATIN) 100000 UNIT/ML suspension Take 5 mLs (500,000 Units total) by mouth 4 (four) times daily.  60 mL  0  . omeprazole (PRILOSEC) 40 MG capsule Take 1 capsule (40 mg total) by mouth daily.  90 capsule  2  . ondansetron (ZOFRAN) 8 MG tablet Take 1 tablet (8 mg total) by mouth 2 (two) times daily. Take two times a day starting the day after chemo for 3 days. Then take two times a day as needed for nausea or vomiting.  30 tablet  1  . Probiotic Product (PROBIOTIC DAILY PO) Take 1 tablet by mouth daily.       . prochlorperazine (COMPAZINE) 25 MG suppository Place 1 suppository (25 mg total) rectally every 12 (twelve) hours as  needed for nausea.  12 suppository  3  . sucralfate (CARAFATE) 1 G tablet Take 1 tablet (1 g total) by mouth 4 (four) times daily.  120 tablet  5  . UNABLE TO FIND Cranial prosthesis due to chemotherapy induced hair loss.  1 Units  0  . valACYclovir (VALTREX) 500 MG tablet Take 1 tablet (500 mg total) by mouth 2 (two) times daily.  60 tablet  2   No current facility-administered medications for this encounter.    Allergies  Allergen Reactions  . Chlorhexidine     Patient skin gets bright red with rash.      . Codeine Swelling  . Medralone (Methylprednisolone Acetate)     "coming out of skin"   . Oxycodone-Acetaminophen Other (See Comments)    Face got red and hot      PHYSICAL EXAM: Filed Vitals:   03/26/13 1001  BP: 118/72  Pulse: 76  Weight: 232 lb 12.8 oz (105.597 kg)  SpO2: 96%     General:  Well appearing. No respiratory difficulty HEENT: normal Neck: supple. no JVD. Carotids 2+ bilat; no bruits. No lymphadenopathy or thryomegaly appreciated. Cor: PMI nondisplaced. Regular rate & rhythm. No rubs, gallops or murmurs. Lungs: clear Abdomen: obese, soft, nontender, nondistended. No hepatosplenomegaly. No bruits or masses. Good bowel sounds. Extremities: no cyanosis, clubbing, rash, edema Neuro: alert & oriented x 3, cranial nerves grossly intact. moves all 4 extremities w/o difficulty. Affect pleasant.    ASSESSMENT & PLAN:

## 2013-03-26 NOTE — Progress Notes (Signed)
  Echocardiogram 2D Echocardiogram has been performed.  Julia Zuniga 03/26/2013, 10:00 AM

## 2013-03-27 NOTE — Assessment & Plan Note (Signed)
Controlled, continue carvedilol.

## 2013-03-27 NOTE — Assessment & Plan Note (Addendum)
Have reviewed today's echo with the patient, EF and lat s' remain stable.  Will continue herceptin therapy at this time.  She continues to get herceptin weekly through May therefore will follow up in 2 months with repeat echo then extend to every 3 months.  Continue carvedilol.

## 2013-03-30 ENCOUNTER — Ambulatory Visit (HOSPITAL_BASED_OUTPATIENT_CLINIC_OR_DEPARTMENT_OTHER): Payer: BC Managed Care – PPO | Admitting: Adult Health

## 2013-03-30 ENCOUNTER — Other Ambulatory Visit (HOSPITAL_BASED_OUTPATIENT_CLINIC_OR_DEPARTMENT_OTHER): Payer: BC Managed Care – PPO | Admitting: Lab

## 2013-03-30 ENCOUNTER — Ambulatory Visit (HOSPITAL_BASED_OUTPATIENT_CLINIC_OR_DEPARTMENT_OTHER): Payer: BC Managed Care – PPO

## 2013-03-30 ENCOUNTER — Encounter: Payer: Self-pay | Admitting: Adult Health

## 2013-03-30 VITALS — BP 147/84 | HR 89 | Temp 98.5°F | Resp 20 | Ht 66.0 in | Wt 232.2 lb

## 2013-03-30 DIAGNOSIS — C50411 Malignant neoplasm of upper-outer quadrant of right female breast: Secondary | ICD-10-CM

## 2013-03-30 DIAGNOSIS — C50419 Malignant neoplasm of upper-outer quadrant of unspecified female breast: Secondary | ICD-10-CM

## 2013-03-30 DIAGNOSIS — K1370 Unspecified lesions of oral mucosa: Secondary | ICD-10-CM

## 2013-03-30 DIAGNOSIS — Z171 Estrogen receptor negative status [ER-]: Secondary | ICD-10-CM

## 2013-03-30 DIAGNOSIS — Z5111 Encounter for antineoplastic chemotherapy: Secondary | ICD-10-CM

## 2013-03-30 DIAGNOSIS — C50919 Malignant neoplasm of unspecified site of unspecified female breast: Secondary | ICD-10-CM

## 2013-03-30 DIAGNOSIS — K219 Gastro-esophageal reflux disease without esophagitis: Secondary | ICD-10-CM

## 2013-03-30 DIAGNOSIS — Z5112 Encounter for antineoplastic immunotherapy: Secondary | ICD-10-CM

## 2013-03-30 LAB — CBC WITH DIFFERENTIAL/PLATELET
BASO%: 0.2 % (ref 0.0–2.0)
Eosinophils Absolute: 0 10*3/uL (ref 0.0–0.5)
HCT: 29.8 % — ABNORMAL LOW (ref 34.8–46.6)
LYMPH%: 13.8 % — ABNORMAL LOW (ref 14.0–49.7)
MCHC: 32 g/dL (ref 31.5–36.0)
MONO#: 0.5 10*3/uL (ref 0.1–0.9)
NEUT%: 81.8 % — ABNORMAL HIGH (ref 38.4–76.8)
Platelets: 215 10*3/uL (ref 145–400)
WBC: 13.1 10*3/uL — ABNORMAL HIGH (ref 3.9–10.3)

## 2013-03-30 LAB — COMPREHENSIVE METABOLIC PANEL (CC13)
BUN: 13.4 mg/dL (ref 7.0–26.0)
CO2: 21 mEq/L — ABNORMAL LOW (ref 22–29)
Creatinine: 0.7 mg/dL (ref 0.6–1.1)
Glucose: 132 mg/dl — ABNORMAL HIGH (ref 70–99)
Total Bilirubin: 0.33 mg/dL (ref 0.20–1.20)

## 2013-03-30 MED ORDER — ACETAMINOPHEN 325 MG PO TABS
650.0000 mg | ORAL_TABLET | Freq: Once | ORAL | Status: AC
  Administered 2013-03-30: 650 mg via ORAL

## 2013-03-30 MED ORDER — SODIUM CHLORIDE 0.9 % IV SOLN
Freq: Once | INTRAVENOUS | Status: AC
  Administered 2013-03-30: 11:00:00 via INTRAVENOUS

## 2013-03-30 MED ORDER — SODIUM CHLORIDE 0.9 % IJ SOLN
10.0000 mL | INTRAMUSCULAR | Status: DC | PRN
  Administered 2013-03-30: 10 mL
  Filled 2013-03-30: qty 10

## 2013-03-30 MED ORDER — DEXAMETHASONE SODIUM PHOSPHATE 4 MG/ML IJ SOLN
20.0000 mg | Freq: Once | INTRAMUSCULAR | Status: AC
  Administered 2013-03-30: 20 mg via INTRAVENOUS

## 2013-03-30 MED ORDER — SODIUM CHLORIDE 0.9 % IV SOLN
900.0000 mg | Freq: Once | INTRAVENOUS | Status: AC
  Administered 2013-03-30: 900 mg via INTRAVENOUS
  Filled 2013-03-30: qty 90

## 2013-03-30 MED ORDER — TRASTUZUMAB CHEMO INJECTION 440 MG
2.0000 mg/kg | Freq: Once | INTRAVENOUS | Status: AC
  Administered 2013-03-30: 210 mg via INTRAVENOUS
  Filled 2013-03-30: qty 10

## 2013-03-30 MED ORDER — HEPARIN SOD (PORK) LOCK FLUSH 100 UNIT/ML IV SOLN
500.0000 [IU] | Freq: Once | INTRAVENOUS | Status: AC | PRN
  Administered 2013-03-30: 500 [IU]
  Filled 2013-03-30: qty 5

## 2013-03-30 MED ORDER — ONDANSETRON 16 MG/50ML IVPB (CHCC)
16.0000 mg | Freq: Once | INTRAVENOUS | Status: AC
  Administered 2013-03-30: 16 mg via INTRAVENOUS

## 2013-03-30 MED ORDER — SODIUM CHLORIDE 0.9 % IV SOLN
65.0000 mg/m2 | Freq: Once | INTRAVENOUS | Status: AC
  Administered 2013-03-30: 140 mg via INTRAVENOUS
  Filled 2013-03-30: qty 14

## 2013-03-30 MED ORDER — DIPHENHYDRAMINE HCL 25 MG PO CAPS
50.0000 mg | ORAL_CAPSULE | Freq: Once | ORAL | Status: AC
Start: 2013-03-30 — End: 2013-03-30
  Administered 2013-03-30: 50 mg via ORAL

## 2013-03-30 NOTE — Patient Instructions (Signed)
Doing well.  Proceed with chemotherapy.  Please call us if you have any questions or concerns.    

## 2013-03-30 NOTE — Progress Notes (Signed)
OFFICE PROGRESS NOTE  CC  DOOLITTLE, Harrel Lemon, MD [address] [place] [address] Dr. Almond Lint Dr. Lurline Hare  DIAGNOSIS: 52 year old female with invasive ductal carcinoma of the right breast diagnosed December 2013.  PRIOR THERAPY:  #1 patient was originally seen in the multidisciplinary breast clinic on 12/17/2012 with a screening mammogram that showed an abnormality in the right breast. This abnormality measured 1.6 cm. She had a biopsy performed that showed invasive ductal carcinoma grade 2, ER negative, PR negative, HER-2 positive with a Ki-67 of 80%.  #2 patient is now status post right lumpectomy on 12/31/2012 with the pathology revealing a 8 cm invasive ductal carcinoma grade 3 with lymphovascular invasion, ER negative PR negative HER-2/neu positive with amplification of 4.36, 2 sentinel nodes were negative for metastatic disease. Patient did have positive margins and she will need a reexcision which will be performed on 01/14/2013. Patient has a Port-A-Cath in place.  #3 patient will proceed with adjuvant chemotherapy initially consisting of Taxotere carboplatinum and Herceptin. With Taxotere carboplatinum to be given every 21 days and Herceptin on a weekly basis for a total of 6 cycles of TC.we will plan on starting her first cycle of chemotherapy on 01/26/2013.  CURRENT THERAPY: TCH cycle 4 day 1 with weekly herceptin  INTERVAL HISTORY: Julia Zuniga 52 y.o. female returns for followup today. She's doing well today.  Her voice has returned.  She had an echo on 4/17 and appt with Dr. Gala Romney and was cleared to continue with Herceptin therapy.  She hasn't taken neurontin recently b/c she hasn't had any numbness and is doing well with this.  She denies fevers, chills, nausea, vomiting, constipation, diarrhea.    MEDICAL HISTORY: Past Medical History  Diagnosis Date  . Breast cancer   . Hyperlipidemia   . Diverticulitis   . Hernia, hiatal   . Heartburn   .  Anxiety   . Hot flashes   . PONV (postoperative nausea and vomiting)     ALLERGIES:  is allergic to chlorhexidine; codeine; medralone; and oxycodone-acetaminophen.  MEDICATIONS:  Current Outpatient Prescriptions  Medication Sig Dispense Refill  . atorvastatin (LIPITOR) 40 MG tablet Take 40 mg by mouth every evening.      . B Complex-C (SUPER B COMPLEX PO) Take 1 tablet by mouth daily.      . carvedilol (COREG) 6.25 MG tablet Take 1 tablet (6.25 mg total) by mouth 2 (two) times daily with a meal.  60 tablet  6  . dexamethasone (DECADRON) 4 MG tablet Take 2 tablets (8 mg total) by mouth 2 (two) times daily with a meal. Take two times a day the day before Taxotere. Then take two times a day starting the day after chemo for 3 days.  30 tablet  1  . gabapentin (NEURONTIN) 100 MG capsule Take 2 capsules three times a day  180 capsule  6  . HYDROcodone-acetaminophen (NORCO) 5-325 MG per tablet Take 1-2 tablets by mouth every 6 (six) hours as needed for pain.  15 tablet  1  . ibuprofen (ADVIL,MOTRIN) 200 MG tablet Take 400 mg by mouth every 6 (six) hours as needed. Pain      . nystatin (MYCOSTATIN) 100000 UNIT/ML suspension Take 5 mLs (500,000 Units total) by mouth 4 (four) times daily.  60 mL  0  . omeprazole (PRILOSEC) 40 MG capsule Take 1 capsule (40 mg total) by mouth daily.  90 capsule  2  . ondansetron (ZOFRAN) 8 MG tablet Take 1 tablet (  8 mg total) by mouth 2 (two) times daily. Take two times a day starting the day after chemo for 3 days. Then take two times a day as needed for nausea or vomiting.  30 tablet  1  . Probiotic Product (PROBIOTIC DAILY PO) Take 1 tablet by mouth daily.       . prochlorperazine (COMPAZINE) 25 MG suppository Place 1 suppository (25 mg total) rectally every 12 (twelve) hours as needed for nausea.  12 suppository  3  . sucralfate (CARAFATE) 1 G tablet Take 1 tablet (1 g total) by mouth 4 (four) times daily.  120 tablet  5  . UNABLE TO FIND Cranial prosthesis due to  chemotherapy induced hair loss.  1 Units  0  . valACYclovir (VALTREX) 500 MG tablet Take 1 tablet (500 mg total) by mouth 2 (two) times daily.  60 tablet  2   No current facility-administered medications for this visit.    SURGICAL HISTORY:  Past Surgical History  Procedure Laterality Date  . Cesarean section      x 3  . Tubal ligation    . Tonsillectomy and adenoidectomy    . Cholecystectomy  2001    lap choli  . Breast lumpectomy with sentinel lymph node biopsy  12/31/2012    Procedure: BREAST LUMPECTOMY WITH SENTINEL LYMPH NODE BX;  Surgeon: Almond Lint, MD;  Location: [facility];  Service: General;  Laterality: Right;  . Portacath placement  12/31/2012    Procedure: INSERTION PORT-A-CATH;  Surgeon: Almond Lint, MD;  Location:  SURGERY CENTER;  Service: General;  Laterality: Left;  Left Subclavian Vein  . Re-excision of breast cancer,superior margins  01/14/2013    Procedure: RE-EXCISION OF BREAST CANCER,SUPERIOR MARGINS;  Surgeon: Almond Lint, MD;  Location: [facility];  Service: General;  Laterality: Right;  right breast re excision for markers     REVIEW OF SYSTEMS:   General: fatigue (+), night sweats (-), fever (-), pain (-) Lymph: palpable nodes (-) HEENT: vision changes (-), mucositis (-), gum bleeding (-), epistaxis (-) Cardiovascular: chest pain (-), palpitations (-) Pulmonary: shortness of breath (-), dyspnea on exertion (-), cough (-), hemoptysis (-) GI:  Early satiety (-), melena (-), dysphagia (-), nausea/vomiting (+), diarrhea (+) GU: dysuria (-), hematuria (-), incontinence (-) Musculoskeletal: joint swelling (-), joint pain (-), back pain (-) Neuro: weakness (-), numbness (-), headache (-), confusion (-) Skin: Rash (-), lesions (-), dryness (-) Psych: depression (-), suicidal/homicidal ideation (-), feeling of hopelessness (-)  PHYSICAL EXAMINATION: Blood pressure 147/84, pulse 89, temperature 98.5 F (36.9 C), temperature source Oral,  resp. rate 20, height 5\' 6"  (1.676 m), weight 232 lb 3.2 oz (105.325 kg), last menstrual period 01/07/2009. Body mass index is 37.5 kg/(m^2). General: Patient is a well appearing female in no acute distress HEENT: PERRLA, sclerae anicteric no conjunctival pallor, MMM, erythema and ulcerations on tongue, no white patches Neck: supple, no palpable adenopathy Lungs: clear to auscultation bilaterally, no wheezes, rhonchi, or rales Cardiovascular: regular rate rhythm, S1, S2, no murmurs, rubs or gallops Abdomen: Soft, non-tender, non-distended, normoactive bowel sounds, no HSM Extremities: warm and well perfused, no clubbing, cyanosis, or edema Skin: No rashes or lesions Neuro: Non-focal ECOG PERFORMANCE STATUS: 0 - Asymptomatic Right breast lumpectomy scar healed, no nodularity, left breast, no masses or nipple discharge   LABORATORY DATA: Lab Results  Component Value Date   WBC 13.1* 03/30/2013   HGB 9.5* 03/30/2013   HCT 29.8* 03/30/2013   MCV 83.1 03/30/2013  PLT 215 03/30/2013      Chemistry      Component Value Date/Time   NA 142 03/23/2013 1054   NA 135 02/24/2013 1857   K 4.0 03/23/2013 1054   K 3.4* 02/24/2013 1857   CL 105 03/23/2013 1054   CL 97 02/24/2013 1857   CO2 25 03/23/2013 1054   CO2 29 02/24/2013 1857   BUN 6.3* 03/23/2013 1054   BUN 6 02/24/2013 1857   CREATININE 0.6 03/23/2013 1054   CREATININE 0.55 02/24/2013 1857      Component Value Date/Time   CALCIUM 9.4 03/23/2013 1054   CALCIUM 9.2 02/24/2013 1857   ALKPHOS 112 03/23/2013 1054   ALKPHOS 113 02/24/2013 1857   AST 22 03/23/2013 1054   AST 18 02/24/2013 1857   ALT 43 03/23/2013 1054   ALT 30 02/24/2013 1857   BILITOT 0.54 03/23/2013 1054   BILITOT 0.2* 02/24/2013 1857    ADDITIONAL INFORMATION: 1. PROGNOSTIC INDICATORS - ACIS Results IMMUNOHISTOCHEMICAL AND MORPHOMETRIC ANALYSIS BY THE AUTOMATED CELLULAR IMAGING SYSTEM (ACIS) Estrogen Receptor (Negative, <1%): 0%, NEGATIVE Progesterone Receptor (Negative, <1%):  0%, NEGATIVE COMMENT: The negative hormone receptor study(ies) in this case have an internal positive control. All controls stained appropriately Pecola Leisure MD Pathologist, Electronic Signature ( Signed 01/07/2013) 1. CHROMOGENIC IN-SITU HYBRIDIZATION Interpretation: HER2/NEU BY CISH - SHOWS AMPLIFICATION BY CISH ANALYSIS. THE RATIO OF HER2: CEP 17 SIGNALS WAS 4.36. Reference range: Ratio: HER2:CEP17 < 1.8 gene amplification not observed Ratio: HER2:CEP 17 1.8-2.2 - equivocal result Ratio: HER2:CEP17 > 2.2 - gene amplification observed 1 of 4 FINAL for JAHIRA, SWISS ([id number]) ADDITIONAL INFORMATION:(continued) Pecola Leisure MD Pathologist, Electronic Signature ( Signed 01/07/2013) FINAL DIAGNOSIS Diagnosis 1. Breast, lumpectomy, Right - INVASIVE DUCTAL CARCINOMA, GRADE III (1.8 CM), SEE COMMENT. - LYMPHOVASCULAR INVASION IDENTIFIED. - INVASIVE TUMOR IS 1 MM TO NEAREST MARGIN (POSTERIOR). - DUCTAL CARCINOMA IN SITU, GRADE III WITH COMEDO NECROSIS. - IN SITU CARCINOMA IS LESS THAN 0.1 MM FROM ANTERIOR-INFERIOR AND POSTERIOR MARGINS. - SEE TUMOR TEMPLATE BELOW. 2. Lymph node, sentinel, biopsy, Right axillary - ONE LYMPH NODE, NEGATIVE FOR TUMOR (0/1), SEE COMMENT. 3. Lymph node, sentinel, biopsy, Right axillary - ONE LYMPH NODE, NEGATIVE FOR TUMOR (0/1), SEE COMMENT. Microscopic Comment 1. BREAST, INVASIVE TUMOR, WITH LYMPH NODE SAMPLING Specimen, including laterality: Right breast Procedure: Lumpectomy Grade: III of III Tubule formation: II Nuclear pleomorphism: III Mitotic:III Tumor size (gross measurement): 1.8 cm Margins: Invasive, distance to closest margin: 0.6 cm In-situ, distance to closest margin: less than 0.1 cm If margin positive, focally or broadly: N/A Lymphovascular invasion: Present Ductal carcinoma in situ: Present Grade: III of III Extensive intraductal component: Absent Lobular neoplasia: Absent Tumor focality: Unifocal Treatment effect:  None If present, treatment effect in breast tissue, lymph nodes or both: N/A Extent of tumor: Skin: N/A Nipple: N/A Skeletal muscle: N/A Lymph nodes: # examined: 2 Lymph nodes with metastasis: 0 Breast prognostic profile: Estrogen receptor: Repeated, previous study demonstrated 0% positivity ([id number]) 2 of 4 FINAL for LALA, BEEN ([id number]) Microscopic Comment(continued) Progesterone receptor: Repeated, previous study demonstrated 0% positivity ([id number]) Her 2 neu: Repeated, previous study demonstrated amplification (3.88) ([id number]) Ki-67: Not repeated, previous study demonstrated 83% proliferation rate ([id number]) Non-neoplastic breast: Previous biopsy site, fibrocystic change and microcalcifications TNM: pT1c pN0 pMX   RADIOGRAPHIC STUDIES:  Mr Breast Bilateral W Wo Contrast  12/23/2012  *RADIOLOGY REPORT*  Clinical Data: recent diagnosis of invasive ductal carcinoma 12 o'clock right breast, for treatment planning  BILATERAL BREAST MRI WITH AND  WITHOUT CONTRAST  Technique: Multiplanar, multisequence MR images of both breasts were obtained prior to and following the intravenous administration of 20ml of multihance.  Three dimensional images were evaluated at the independent DynaCad workstation.  Comparison:  mammographic and ultrasound images 12/08/12 and 11/20/12  Findings: There is mild background parenchymal enhancement.  The left breast is negative. There is no evidence of axillary or internal mammary adenopathy.  There is no abnormal skin or nipple enhancement.  On the right, associated with the recently placed biopsy marker clip, there is an enhancing spiculated mass in the 12 o'clock position 11cm from the nipple.  The mass measures 23 x 12mm transverse dimension by 35mm craniocaudal dimension.  1cm anterior and slightly inferior to the mass is a second 7mm mass which is connected to the dominant mass by a thin bridge of enhancement. There are no other  abnormalities in the right breast.  IMPRESSION: Biopsy-proven invasive carcinoma in the 12 o'clock position of the right breast, with an additional satellite focus as described above.  BI-RADS CATEGORY 6:  Known biopsy-proven malignancy - appropriate action should be taken.  THREE-DIMENSIONAL MR IMAGE RENDERING ON INDEPENDENT WORKSTATION:  Three-dimensional MR images were rendered by post-processing of the original MR data on an independent workstation.  The three- dimensional MR images were interpreted, and findings were reported in the accompanying complete MRI report for this study.   Original Report Authenticated By: Esperanza Heir, M.D.    Nm Sentinel Node Inj-no Rpt (breast)  12/31/2012  CLINICAL DATA: right breast cancer   Sulfur colloid was injected intradermally by the nuclear medicine  technologist for breast cancer sentinel node localization.     Dg Chest Port 1 View  12/31/2012  *RADIOLOGY REPORT*  Clinical Data: Port-A-Cath placement.  History of breast cancer.  PORTABLE CHEST - 1 VIEW  Comparison: 03/26/2011.  Findings: Central line has been placed from the left.  The tip is at the level of the distal superior vena cava/caval atrial junction.  No gross pneumothorax.  Consolidation medial aspect of the lung bases may be present possibly representing atelectasis or infiltrate.  Follow-up two- view chest with better inspiration recommended for further delineation.  Heart size top normal.  Mild central pulmonary vascular prominence.  Surgical clips project over the right lower chest and may be related to breast reconstruction/axillary lymph node dissection.  IMPRESSION: Central line has been placed from the left.  The tip is at the level of the distal superior vena cava/caval atrial junction.  No gross pneumothorax.  Consolidation medial aspect of the lung bases may be present possibly representing atelectasis or infiltrate.  Follow-up two- view chest with better inspiration recommended for further  delineation.   Original Report Authenticated By: Lacy Duverney, M.D.    Dg Fluoro Guide Cv Line-no Report  12/31/2012  CLINICAL DATA: portacath   FLOURO GUIDE CV LINE  Fluoroscopy was utilized by the requesting physician.  No radiographic  interpretation.      ASSESSMENT: 52 year old female with  #1 new diagnosis of invasive ductal carcinoma of the right breast she is now status post lumpectomy with sentinel lymph node biopsy with the final pathology revealing a 1.8 cm invasive ductal carcinoma grade 3 ER negative PR negative HER-2/neu amplified at 4.36. 2 sentinel nodes were negative for metastatic disease. There was noted to be lymphovascular invasion. Postoperatively patient is doing well. She will go for a reexcision for positive margin.  #2 subsequently begun on adjuvant chemotherapy consisting of Taxotere carboplatinum and Herceptin starting 01/26/2013.  Total of 6 cycles of TCH combination is planned. Her dose of Taxotere was reduced with cycle 3.  #3 reflux which she has a chronic history. But it is worsened since starting her chemotherapy.  #4 rash on her scapula.  #5 neuropathy--this is stable.  She has stopped the neurontin currently as her numbness resolved.   PLAN:   #1 Doing well, labs look good.  Proceed with chemotherapy.    #2 she will return in one week's time for Herceptin.  #3 I prescribed Valtrex BID last week for her continued oral ulcers.  She will start taking this today.    All questions were answered. The patient knows to call the clinic with any problems, questions or concerns. We can certainly see the patient much sooner if necessary.  I spent 25 minutes counseling the patient face to face. The total time spent in the appointment was 30 minutes.  Cherie Ouch Lyn Hollingshead, NP Medical Oncology [facility] Phone: [phone] 03/30/2013, 10:15 AM

## 2013-03-30 NOTE — Patient Instructions (Addendum)
[facility] Discharge Instructions for Patients Receiving Chemotherapy  Today you received the following chemotherapy agents Taxotere, Carboplatin and Herceptin.  To help prevent nausea and vomiting after your treatment, we encourage you to take your nausea medication as prescribed.    If you develop nausea and vomiting that is not controlled by your nausea medication, call the clinic. If it is after clinic hours your family physician or the after hours number for the clinic or go to the Emergency Department.   BELOW ARE SYMPTOMS THAT SHOULD BE REPORTED IMMEDIATELY:  *FEVER GREATER THAN 100.5 F  *CHILLS WITH OR WITHOUT FEVER  NAUSEA AND VOMITING THAT IS NOT CONTROLLED WITH YOUR NAUSEA MEDICATION  *UNUSUAL SHORTNESS OF BREATH  *UNUSUAL BRUISING OR BLEEDING  TENDERNESS IN MOUTH AND THROAT WITH OR WITHOUT PRESENCE OF ULCERS  *URINARY PROBLEMS  *BOWEL PROBLEMS  UNUSUAL RASH Items with * indicate a potential emergency and should be followed up as soon as possible.  Please let the nurse know about any problems that you may have experienced. Feel free to call the clinic you have any questions or concerns. The clinic phone number is [phone].   I have been informed and understand all the instructions given to me. I know to contact the clinic, my physician, or go to the Emergency Department if any problems should occur. I do not have any questions at this time, but understand that I may call the clinic during office hours   should I have any questions or need assistance in obtaining follow up care.    __________________________________________  _____________  __________ Signature of Patient or Authorized Representative            Date                   Time    __________________________________________ Nurse's Signature    

## 2013-03-31 ENCOUNTER — Ambulatory Visit (HOSPITAL_BASED_OUTPATIENT_CLINIC_OR_DEPARTMENT_OTHER): Payer: BC Managed Care – PPO

## 2013-03-31 VITALS — BP 148/92 | HR 75 | Temp 97.8°F

## 2013-03-31 DIAGNOSIS — C50419 Malignant neoplasm of upper-outer quadrant of unspecified female breast: Secondary | ICD-10-CM

## 2013-03-31 DIAGNOSIS — Z5189 Encounter for other specified aftercare: Secondary | ICD-10-CM

## 2013-03-31 MED ORDER — PEGFILGRASTIM INJECTION 6 MG/0.6ML
6.0000 mg | Freq: Once | SUBCUTANEOUS | Status: AC
  Administered 2013-03-31: 6 mg via SUBCUTANEOUS
  Filled 2013-03-31: qty 0.6

## 2013-04-06 ENCOUNTER — Ambulatory Visit (HOSPITAL_BASED_OUTPATIENT_CLINIC_OR_DEPARTMENT_OTHER): Payer: BC Managed Care – PPO

## 2013-04-06 ENCOUNTER — Other Ambulatory Visit: Payer: Self-pay | Admitting: *Deleted

## 2013-04-06 ENCOUNTER — Encounter: Payer: Self-pay | Admitting: Adult Health

## 2013-04-06 ENCOUNTER — Ambulatory Visit (HOSPITAL_BASED_OUTPATIENT_CLINIC_OR_DEPARTMENT_OTHER): Payer: BC Managed Care – PPO | Admitting: Adult Health

## 2013-04-06 ENCOUNTER — Other Ambulatory Visit (HOSPITAL_BASED_OUTPATIENT_CLINIC_OR_DEPARTMENT_OTHER): Payer: BC Managed Care – PPO | Admitting: Lab

## 2013-04-06 VITALS — BP 118/79 | HR 105 | Temp 98.4°F | Resp 20 | Ht 66.0 in | Wt 225.8 lb

## 2013-04-06 DIAGNOSIS — C50411 Malignant neoplasm of upper-outer quadrant of right female breast: Secondary | ICD-10-CM

## 2013-04-06 DIAGNOSIS — G589 Mononeuropathy, unspecified: Secondary | ICD-10-CM

## 2013-04-06 DIAGNOSIS — K219 Gastro-esophageal reflux disease without esophagitis: Secondary | ICD-10-CM

## 2013-04-06 DIAGNOSIS — C50419 Malignant neoplasm of upper-outer quadrant of unspecified female breast: Secondary | ICD-10-CM

## 2013-04-06 DIAGNOSIS — Z171 Estrogen receptor negative status [ER-]: Secondary | ICD-10-CM

## 2013-04-06 DIAGNOSIS — Z5112 Encounter for antineoplastic immunotherapy: Secondary | ICD-10-CM

## 2013-04-06 LAB — CBC WITH DIFFERENTIAL/PLATELET
BASO%: 0.2 % (ref 0.0–2.0)
EOS%: 0.4 % (ref 0.0–7.0)
LYMPH%: 49.4 % (ref 14.0–49.7)
MCH: 27 pg (ref 25.1–34.0)
MCHC: 32.3 g/dL (ref 31.5–36.0)
MCV: 83.4 fL (ref 79.5–101.0)
MONO%: 14.6 % — ABNORMAL HIGH (ref 0.0–14.0)
NEUT#: 1.7 10*3/uL (ref 1.5–6.5)
RBC: 3.56 10*6/uL — ABNORMAL LOW (ref 3.70–5.45)
RDW: 16.7 % — ABNORMAL HIGH (ref 11.2–14.5)
nRBC: 0 % (ref 0–0)

## 2013-04-06 LAB — COMPREHENSIVE METABOLIC PANEL (CC13)
ALT: 32 U/L (ref 0–55)
Alkaline Phosphatase: 108 U/L (ref 40–150)
Creatinine: 0.6 mg/dL (ref 0.6–1.1)
Sodium: 135 mEq/L — ABNORMAL LOW (ref 136–145)
Total Bilirubin: 0.63 mg/dL (ref 0.20–1.20)
Total Protein: 6.6 g/dL (ref 6.4–8.3)

## 2013-04-06 MED ORDER — ONDANSETRON HCL 8 MG PO TABS: 8.0000 mg | ORAL_TABLET | Freq: Two times a day (BID) | ORAL | Status: DC

## 2013-04-06 MED ORDER — SODIUM CHLORIDE 0.9 % IJ SOLN
10.0000 mL | INTRAMUSCULAR | Status: DC | PRN
  Administered 2013-04-06: 10 mL
  Filled 2013-04-06: qty 10

## 2013-04-06 MED ORDER — ACETAMINOPHEN 325 MG PO TABS
650.0000 mg | ORAL_TABLET | Freq: Once | ORAL | Status: AC
  Administered 2013-04-06: 650 mg via ORAL

## 2013-04-06 MED ORDER — TRASTUZUMAB CHEMO INJECTION 440 MG
2.0000 mg/kg | Freq: Once | INTRAVENOUS | Status: AC
  Administered 2013-04-06: 210 mg via INTRAVENOUS
  Filled 2013-04-06: qty 10

## 2013-04-06 MED ORDER — DEXAMETHASONE 4 MG PO TABS: 8.0000 mg | ORAL_TABLET | Freq: Two times a day (BID) | ORAL | Status: DC

## 2013-04-06 MED ORDER — HEPARIN SOD (PORK) LOCK FLUSH 100 UNIT/ML IV SOLN
500.0000 [IU] | Freq: Once | INTRAVENOUS | Status: AC | PRN
  Administered 2013-04-06: 500 [IU]
  Filled 2013-04-06: qty 5

## 2013-04-06 MED ORDER — SODIUM CHLORIDE 0.9 % IV SOLN
Freq: Once | INTRAVENOUS | Status: AC
  Administered 2013-04-06: 11:00:00 via INTRAVENOUS

## 2013-04-06 NOTE — Patient Instructions (Addendum)
Doing well.  Proceed with Herceptin today.  Please call us if you have any questions or concerns.

## 2013-04-06 NOTE — Patient Instructions (Addendum)
[facility] Discharge Instructions for Patients Receiving Chemotherapy  Today you received the following chemotherapy agents Herceptin.  To help prevent nausea and vomiting after your treatment, we encourage you to take your nausea medication as prescribed.   If you develop nausea and vomiting that is not controlled by your nausea medication, call the clinic. If it is after clinic hours your family physician or the after hours number for the clinic or go to the Emergency Department.   BELOW ARE SYMPTOMS THAT SHOULD BE REPORTED IMMEDIATELY:  *FEVER GREATER THAN 100.5 F  *CHILLS WITH OR WITHOUT FEVER  NAUSEA AND VOMITING THAT IS NOT CONTROLLED WITH YOUR NAUSEA MEDICATION  *UNUSUAL SHORTNESS OF BREATH  *UNUSUAL BRUISING OR BLEEDING  TENDERNESS IN MOUTH AND THROAT WITH OR WITHOUT PRESENCE OF ULCERS  *URINARY PROBLEMS  *BOWEL PROBLEMS  UNUSUAL RASH Items with * indicate a potential emergency and should be followed up as soon as possible.  Feel free to call the clinic you have any questions or concerns. The clinic phone number is [phone].   I have been informed and understand all the instructions given to me. I know to contact the clinic, my physician, or go to the Emergency Department if any problems should occur. I do not have any questions at this time, but understand that I may call the clinic during office hours   should I have any questions or need assistance in obtaining follow up care.    __________________________________________  _____________  __________ Signature of Patient or Authorized Representative            Date                   Time    __________________________________________ Nurse's Signature    

## 2013-04-06 NOTE — Progress Notes (Signed)
OFFICE PROGRESS NOTE  CC  DOOLITTLE, Harrel Lemon, MD [address] [place] [address] Dr. Almond Lint Dr. Lurline Hare  DIAGNOSIS: 52 year old female with invasive ductal carcinoma of the right breast diagnosed December 2013.  PRIOR THERAPY:  #1 patient was originally seen in the multidisciplinary breast clinic on 12/17/2012 with a screening mammogram that showed an abnormality in the right breast. This abnormality measured 1.6 cm. She had a biopsy performed that showed invasive ductal carcinoma grade 2, ER negative, PR negative, HER-2 positive with a Ki-67 of 80%.  #2 patient is now status post right lumpectomy on 12/31/2012 with the pathology revealing a 8 cm invasive ductal carcinoma grade 3 with lymphovascular invasion, ER negative PR negative HER-2/neu positive with amplification of 4.36, 2 sentinel nodes were negative for metastatic disease. Patient did have positive margins and she will need a reexcision which will be performed on 01/14/2013. Patient has a Port-A-Cath in place.  #3 patient will proceed with adjuvant chemotherapy initially consisting of Taxotere carboplatinum and Herceptin. With Taxotere carboplatinum to be given every 21 days and Herceptin on a weekly basis for a total of 6 cycles of TC.we will plan on starting her first cycle of chemotherapy on 01/26/2013.  CURRENT THERAPY: TCH cycle 4 day 8 with weekly herceptin  INTERVAL HISTORY: Julia Zuniga 52 y.o. female returns for followup today.  She had an echo on 4/17 and appt with Dr. Gala Romney and was cleared to continue with Herceptin therapy.  She is doing essentially well after chemotherapy.  She had diarrhea x 1 that resolved with imodium.  She also has breakdown in her mouth, and continues to use Nystatin and Biotene rinses.  Over the weekend she did miss a step at her home and fall.  She had some pain, swelling, and bruising to her left ankle that is resolving.  She has range of motion and is walking.  Her  numbness remains stable in her finger tips and feet, she continues to take Neurontin and Super b complex as prescribed. She has been taking Ibuprofen to manage the discomfort.  Otherwise, a 10 point ROS is neg.   MEDICAL HISTORY: Past Medical History  Diagnosis Date  . Breast cancer   . Hyperlipidemia   . Diverticulitis   . Hernia, hiatal   . Heartburn   . Anxiety   . Hot flashes   . PONV (postoperative nausea and vomiting)     ALLERGIES:  is allergic to chlorhexidine; codeine; medralone; and oxycodone-acetaminophen.  MEDICATIONS:  Current Outpatient Prescriptions  Medication Sig Dispense Refill  . atorvastatin (LIPITOR) 40 MG tablet Take 40 mg by mouth every evening.      . B Complex-C (SUPER B COMPLEX PO) Take 1 tablet by mouth daily.      . carvedilol (COREG) 6.25 MG tablet Take 1 tablet (6.25 mg total) by mouth 2 (two) times daily with a meal.  60 tablet  6  . dexamethasone (DECADRON) 4 MG tablet Take 2 tablets (8 mg total) by mouth 2 (two) times daily with a meal. Take two times a day the day before Taxotere. Then take two times a day starting the day after chemo for 3 days.  30 tablet  1  . gabapentin (NEURONTIN) 100 MG capsule Take 2 capsules three times a day  180 capsule  6  . ibuprofen (ADVIL,MOTRIN) 200 MG tablet Take 400 mg by mouth every 6 (six) hours as needed. Pain      . nystatin (MYCOSTATIN) 100000 UNIT/ML suspension  Take 5 mLs (500,000 Units total) by mouth 4 (four) times daily.  60 mL  0  . omeprazole (PRILOSEC) 40 MG capsule Take 1 capsule (40 mg total) by mouth daily.  90 capsule  2  . ondansetron (ZOFRAN) 8 MG tablet Take 1 tablet (8 mg total) by mouth 2 (two) times daily. Take two times a day starting the day after chemo for 3 days. Then take two times a day as needed for nausea or vomiting.  30 tablet  1  . prochlorperazine (COMPAZINE) 25 MG suppository Place 1 suppository (25 mg total) rectally every 12 (twelve) hours as needed for nausea.  12 suppository  3  .  UNABLE TO FIND Cranial prosthesis due to chemotherapy induced hair loss.  1 Units  0  . valACYclovir (VALTREX) 500 MG tablet Take 1 tablet (500 mg total) by mouth 2 (two) times daily.  60 tablet  2  . HYDROcodone-acetaminophen (NORCO) 5-325 MG per tablet Take 1-2 tablets by mouth every 6 (six) hours as needed for pain.  15 tablet  1  . Probiotic Product (PROBIOTIC DAILY PO) Take 1 tablet by mouth daily.       . sucralfate (CARAFATE) 1 G tablet Take 1 tablet (1 g total) by mouth 4 (four) times daily.  120 tablet  5   No current facility-administered medications for this visit.    SURGICAL HISTORY:  Past Surgical History  Procedure Laterality Date  . Cesarean section      x 3  . Tubal ligation    . Tonsillectomy and adenoidectomy    . Cholecystectomy  2001    lap choli  . Breast lumpectomy with sentinel lymph node biopsy  12/31/2012    Procedure: BREAST LUMPECTOMY WITH SENTINEL LYMPH NODE BX;  Surgeon: Almond Lint, MD;  Location: [facility];  Service: General;  Laterality: Right;  . Portacath placement  12/31/2012    Procedure: INSERTION PORT-A-CATH;  Surgeon: Almond Lint, MD;  Location: [facility];  Service: General;  Laterality: Left;  Left Subclavian Vein  . Re-excision of breast cancer,superior margins  01/14/2013    Procedure: RE-EXCISION OF BREAST CANCER,SUPERIOR MARGINS;  Surgeon: Almond Lint, MD;  Location: [facility];  Service: General;  Laterality: Right;  right breast re excision for markers     REVIEW OF SYSTEMS:   General: fatigue (+), night sweats (-), fever (-), pain (-) Lymph: palpable nodes (-) HEENT: vision changes (-), mucositis (+), gum bleeding (-), epistaxis (-) Cardiovascular: chest pain (-), palpitations (-) Pulmonary: shortness of breath (-), dyspnea on exertion (-), cough (-), hemoptysis (-) GI:  Early satiety (-), melena (-), dysphagia (-), nausea/vomiting (-), diarrhea (+) GU: dysuria (-), hematuria (-), incontinence  (-) Musculoskeletal: joint swelling (-), joint pain (-), back pain (-) Neuro: weakness (-), numbness (+), headache (-), confusion (-) Skin: Rash (-), lesions (-), dryness (-) Psych: depression (-), suicidal/homicidal ideation (-), feeling of hopelessness (-)  PHYSICAL EXAMINATION: Blood pressure 118/79, pulse 105, temperature 98.4 F (36.9 C), temperature source Oral, resp. rate 20, height 5\' 6"  (1.676 m), weight 225 lb 12.8 oz (102.422 kg), last menstrual period 01/07/2009. Body mass index is 36.46 kg/(m^2). General: Patient is a well appearing female in no acute distress HEENT: PERRLA, sclerae anicteric no conjunctival pallor, MMM, erythema and ulcerations on tongue, no white patches Neck: supple, no palpable adenopathy Lungs: clear to auscultation bilaterally, no wheezes, rhonchi, or rales Cardiovascular: regular rate rhythm, S1, S2, no murmurs, rubs or gallops Abdomen: Soft, non-tender,  non-distended, normoactive bowel sounds, no HSM Extremities: warm and well perfused, no clubbing, cyanosis, or edema Skin: No rashes or lesions Neuro: Non-focal ECOG PERFORMANCE STATUS: 0 - Asymptomatic Right breast lumpectomy scar healed, no nodularity, left breast, no masses or nipple discharge   LABORATORY DATA: Lab Results  Component Value Date   WBC 4.7 04/06/2013   HGB 9.6* 04/06/2013   HCT 29.7* 04/06/2013   MCV 83.4 04/06/2013   PLT 121* 04/06/2013      Chemistry      Component Value Date/Time   NA 139 03/30/2013 0909   NA 135 02/24/2013 1857   K 4.4 03/30/2013 0909   K 3.4* 02/24/2013 1857   CL 107 03/30/2013 0909   CL 97 02/24/2013 1857   CO2 21* 03/30/2013 0909   CO2 29 02/24/2013 1857   BUN 13.4 03/30/2013 0909   BUN 6 02/24/2013 1857   CREATININE 0.7 03/30/2013 0909   CREATININE 0.55 02/24/2013 1857      Component Value Date/Time   CALCIUM 10.0 03/30/2013 0909   CALCIUM 9.2 02/24/2013 1857   ALKPHOS 108 03/30/2013 0909   ALKPHOS 113 02/24/2013 1857   AST 21 03/30/2013 0909   AST 18  02/24/2013 1857   ALT 61* 03/30/2013 0909   ALT 30 02/24/2013 1857   BILITOT 0.33 03/30/2013 0909   BILITOT 0.2* 02/24/2013 1857    ADDITIONAL INFORMATION: 1. PROGNOSTIC INDICATORS - ACIS Results IMMUNOHISTOCHEMICAL AND MORPHOMETRIC ANALYSIS BY THE AUTOMATED CELLULAR IMAGING SYSTEM (ACIS) Estrogen Receptor (Negative, <1%): 0%, NEGATIVE Progesterone Receptor (Negative, <1%): 0%, NEGATIVE COMMENT: The negative hormone receptor study(ies) in this case have an internal positive control. All controls stained appropriately Pecola Leisure MD Pathologist, Electronic Signature ( Signed 01/07/2013) 1. CHROMOGENIC IN-SITU HYBRIDIZATION Interpretation: HER2/NEU BY CISH - SHOWS AMPLIFICATION BY CISH ANALYSIS. THE RATIO OF HER2: CEP 17 SIGNALS WAS 4.36. Reference range: Ratio: HER2:CEP17 < 1.8 gene amplification not observed Ratio: HER2:CEP 17 1.8-2.2 - equivocal result Ratio: HER2:CEP17 > 2.2 - gene amplification observed 1 of 4 FINAL for CHASTIN, GARLITZ ([id number]) ADDITIONAL INFORMATION:(continued) Pecola Leisure MD Pathologist, Electronic Signature ( Signed 01/07/2013) FINAL DIAGNOSIS Diagnosis 1. Breast, lumpectomy, Right - INVASIVE DUCTAL CARCINOMA, GRADE III (1.8 CM), SEE COMMENT. - LYMPHOVASCULAR INVASION IDENTIFIED. - INVASIVE TUMOR IS 1 MM TO NEAREST MARGIN (POSTERIOR). - DUCTAL CARCINOMA IN SITU, GRADE III WITH COMEDO NECROSIS. - IN SITU CARCINOMA IS LESS THAN 0.1 MM FROM ANTERIOR-INFERIOR AND POSTERIOR MARGINS. - SEE TUMOR TEMPLATE BELOW. 2. Lymph node, sentinel, biopsy, Right axillary - ONE LYMPH NODE, NEGATIVE FOR TUMOR (0/1), SEE COMMENT. 3. Lymph node, sentinel, biopsy, Right axillary - ONE LYMPH NODE, NEGATIVE FOR TUMOR (0/1), SEE COMMENT. Microscopic Comment 1. BREAST, INVASIVE TUMOR, WITH LYMPH NODE SAMPLING Specimen, including laterality: Right breast Procedure: Lumpectomy Grade: III of III Tubule formation: II Nuclear pleomorphism: III Mitotic:III Tumor size (gross  measurement): 1.8 cm Margins: Invasive, distance to closest margin: 0.6 cm In-situ, distance to closest margin: less than 0.1 cm If margin positive, focally or broadly: N/A Lymphovascular invasion: Present Ductal carcinoma in situ: Present Grade: III of III Extensive intraductal component: Absent Lobular neoplasia: Absent Tumor focality: Unifocal Treatment effect: None If present, treatment effect in breast tissue, lymph nodes or both: N/A Extent of tumor: Skin: N/A Nipple: N/A Skeletal muscle: N/A Lymph nodes: # examined: 2 Lymph nodes with metastasis: 0 Breast prognostic profile: Estrogen receptor: Repeated, previous study demonstrated 0% positivity ([id number]) 2 of 4 FINAL for WOODROW, DULSKI ([id number]) Microscopic Comment(continued) Progesterone receptor: Repeated, previous study  demonstrated 0% positivity ([id number]) Her 2 neu: Repeated, previous study demonstrated amplification (3.88) ([id number]) Ki-67: Not repeated, previous study demonstrated 83% proliferation rate ([id number]) Non-neoplastic breast: Previous biopsy site, fibrocystic change and microcalcifications TNM: pT1c pN0 pMX   RADIOGRAPHIC STUDIES:  Mr Breast Bilateral W Wo Contrast  12/23/2012  *RADIOLOGY REPORT*  Clinical Data: recent diagnosis of invasive ductal carcinoma 12 o'clock right breast, for treatment planning  BILATERAL BREAST MRI WITH AND WITHOUT CONTRAST  Technique: Multiplanar, multisequence MR images of both breasts were obtained prior to and following the intravenous administration of 20ml of multihance.  Three dimensional images were evaluated at the independent DynaCad workstation.  Comparison:  mammographic and ultrasound images 12/08/12 and 11/20/12  Findings: There is mild background parenchymal enhancement.  The left breast is negative. There is no evidence of axillary or internal mammary adenopathy.  There is no abnormal skin or nipple enhancement.  On the right, associated with the  recently placed biopsy marker clip, there is an enhancing spiculated mass in the 12 o'clock position 11cm from the nipple.  The mass measures 23 x 12mm transverse dimension by 35mm craniocaudal dimension.  1cm anterior and slightly inferior to the mass is a second 7mm mass which is connected to the dominant mass by a thin bridge of enhancement. There are no other abnormalities in the right breast.  IMPRESSION: Biopsy-proven invasive carcinoma in the 12 o'clock position of the right breast, with an additional satellite focus as described above.  BI-RADS CATEGORY 6:  Known biopsy-proven malignancy - appropriate action should be taken.  THREE-DIMENSIONAL MR IMAGE RENDERING ON INDEPENDENT WORKSTATION:  Three-dimensional MR images were rendered by post-processing of the original MR data on an independent workstation.  The three- dimensional MR images were interpreted, and findings were reported in the accompanying complete MRI report for this study.   Original Report Authenticated By: Esperanza Heir, M.D.    Nm Sentinel Node Inj-no Rpt (breast)  12/31/2012  CLINICAL DATA: right breast cancer   Sulfur colloid was injected intradermally by the nuclear medicine  technologist for breast cancer sentinel node localization.     Dg Chest Port 1 View  12/31/2012  *RADIOLOGY REPORT*  Clinical Data: Port-A-Cath placement.  History of breast cancer.  PORTABLE CHEST - 1 VIEW  Comparison: 03/26/2011.  Findings: Central line has been placed from the left.  The tip is at the level of the distal superior vena cava/caval atrial junction.  No gross pneumothorax.  Consolidation medial aspect of the lung bases may be present possibly representing atelectasis or infiltrate.  Follow-up two- view chest with better inspiration recommended for further delineation.  Heart size top normal.  Mild central pulmonary vascular prominence.  Surgical clips project over the right lower chest and may be related to breast reconstruction/axillary lymph  node dissection.  IMPRESSION: Central line has been placed from the left.  The tip is at the level of the distal superior vena cava/caval atrial junction.  No gross pneumothorax.  Consolidation medial aspect of the lung bases may be present possibly representing atelectasis or infiltrate.  Follow-up two- view chest with better inspiration recommended for further delineation.   Original Report Authenticated By: Lacy Duverney, M.D.    Dg Fluoro Guide Cv Line-no Report  12/31/2012  CLINICAL DATA: portacath   FLOURO GUIDE CV LINE  Fluoroscopy was utilized by the requesting physician.  No radiographic  interpretation.      ASSESSMENT: 52 year old female with  #1 new diagnosis of invasive ductal carcinoma of the right breast she  is now status post lumpectomy with sentinel lymph node biopsy with the final pathology revealing a 1.8 cm invasive ductal carcinoma grade 3 ER negative PR negative HER-2/neu amplified at 4.36. 2 sentinel nodes were negative for metastatic disease. There was noted to be lymphovascular invasion. Postoperatively patient is doing well. She will go for a reexcision for positive margin.  #2 subsequently begun on adjuvant chemotherapy consisting of Taxotere carboplatinum and Herceptin starting 01/26/2013. Total of 6 cycles of TCH combination is planned. Her dose of Taxotere was reduced with cycle 3.  #3 reflux which she has a chronic history. But it is worsened since starting her chemotherapy.  #4 rash on her scapula.  #5 neuropathy--this is stable.    PLAN:   #1 Doing well, labs look good.  Proceed with Herceptin.    #2 she will return in one week's time for Herceptin.  #3 She will continue Valtrex BID and Nystatin/Biotene mouth rinses for her mucositis.   #4 I offered Ms. Silbaugh an x ray of her ankle.  She declined and will let me know if the pain worsens in any way and I will arrange it for her.    All questions were answered. The patient knows to call the clinic with any  problems, questions or concerns. We can certainly see the patient much sooner if necessary.  I spent 15 minutes counseling the patient face to face. The total time spent in the appointment was 30 minutes.  Cherie Ouch Lyn Hollingshead, NP Medical Oncology [facility] Phone: [phone] 04/06/2013, 10:21 AM

## 2013-04-13 ENCOUNTER — Encounter: Payer: Self-pay | Admitting: Adult Health

## 2013-04-13 ENCOUNTER — Telehealth: Payer: Self-pay | Admitting: Medical Oncology

## 2013-04-13 ENCOUNTER — Telehealth: Payer: Self-pay | Admitting: Oncology

## 2013-04-13 ENCOUNTER — Ambulatory Visit (HOSPITAL_COMMUNITY)
Admission: RE | Admit: 2013-04-13 | Discharge: 2013-04-13 | Disposition: A | Discharge status: Home / Self Care | Payer: BC Managed Care – PPO | Source: Ambulatory Visit | Attending: Adult Health | Admitting: Adult Health

## 2013-04-13 ENCOUNTER — Other Ambulatory Visit (HOSPITAL_BASED_OUTPATIENT_CLINIC_OR_DEPARTMENT_OTHER): Payer: BC Managed Care – PPO | Admitting: Lab

## 2013-04-13 ENCOUNTER — Ambulatory Visit (HOSPITAL_BASED_OUTPATIENT_CLINIC_OR_DEPARTMENT_OTHER): Payer: BC Managed Care – PPO

## 2013-04-13 ENCOUNTER — Other Ambulatory Visit: Payer: BC Managed Care – PPO | Admitting: Lab

## 2013-04-13 ENCOUNTER — Ambulatory Visit (HOSPITAL_BASED_OUTPATIENT_CLINIC_OR_DEPARTMENT_OTHER): Payer: BC Managed Care – PPO | Admitting: Adult Health

## 2013-04-13 VITALS — BP 116/76 | HR 96 | Temp 98.4°F | Resp 20 | Ht 66.0 in | Wt 235.7 lb

## 2013-04-13 DIAGNOSIS — G589 Mononeuropathy, unspecified: Secondary | ICD-10-CM

## 2013-04-13 DIAGNOSIS — C50919 Malignant neoplasm of unspecified site of unspecified female breast: Secondary | ICD-10-CM

## 2013-04-13 DIAGNOSIS — M25472 Effusion, left ankle: Secondary | ICD-10-CM

## 2013-04-13 DIAGNOSIS — M25579 Pain in unspecified ankle and joints of unspecified foot: Secondary | ICD-10-CM | POA: Insufficient documentation

## 2013-04-13 DIAGNOSIS — C50419 Malignant neoplasm of upper-outer quadrant of unspecified female breast: Secondary | ICD-10-CM

## 2013-04-13 DIAGNOSIS — C50411 Malignant neoplasm of upper-outer quadrant of right female breast: Secondary | ICD-10-CM

## 2013-04-13 DIAGNOSIS — M79609 Pain in unspecified limb: Secondary | ICD-10-CM | POA: Insufficient documentation

## 2013-04-13 DIAGNOSIS — M7989 Other specified soft tissue disorders: Secondary | ICD-10-CM

## 2013-04-13 DIAGNOSIS — Z5112 Encounter for antineoplastic immunotherapy: Secondary | ICD-10-CM

## 2013-04-13 DIAGNOSIS — K219 Gastro-esophageal reflux disease without esophagitis: Secondary | ICD-10-CM

## 2013-04-13 DIAGNOSIS — W19XXXA Unspecified fall, initial encounter: Secondary | ICD-10-CM | POA: Insufficient documentation

## 2013-04-13 LAB — COMPREHENSIVE METABOLIC PANEL (CC13)
ALT: 32 U/L (ref 0–55)
Albumin: 3.2 g/dL — ABNORMAL LOW (ref 3.5–5.0)
CO2: 25 mEq/L (ref 22–29)
Calcium: 8.8 mg/dL (ref 8.4–10.4)
Chloride: 108 mEq/L — ABNORMAL HIGH (ref 98–107)
Potassium: 3.9 mEq/L (ref 3.5–5.1)
Sodium: 142 mEq/L (ref 136–145)
Total Protein: 6.1 g/dL — ABNORMAL LOW (ref 6.4–8.3)

## 2013-04-13 LAB — CBC WITH DIFFERENTIAL/PLATELET
BASO%: 0.6 % (ref 0.0–2.0)
EOS%: 0.1 % (ref 0.0–7.0)
LYMPH%: 24.4 % (ref 14.0–49.7)
MCH: 27.3 pg (ref 25.1–34.0)
MCHC: 32.4 g/dL (ref 31.5–36.0)
MONO#: 0.5 10*3/uL (ref 0.1–0.9)
Platelets: 124 10*3/uL — ABNORMAL LOW (ref 145–400)
RBC: 3.02 10*6/uL — ABNORMAL LOW (ref 3.70–5.45)
WBC: 7.8 10*3/uL (ref 3.9–10.3)
lymph#: 1.9 10*3/uL (ref 0.9–3.3)

## 2013-04-13 MED ORDER — TRASTUZUMAB CHEMO INJECTION 440 MG
2.0000 mg/kg | Freq: Once | INTRAVENOUS | Status: AC
  Administered 2013-04-13: 210 mg via INTRAVENOUS
  Filled 2013-04-13: qty 10

## 2013-04-13 MED ORDER — ACETAMINOPHEN 325 MG PO TABS
650.0000 mg | ORAL_TABLET | Freq: Once | ORAL | Status: AC
  Administered 2013-04-13: 650 mg via ORAL

## 2013-04-13 MED ORDER — SODIUM CHLORIDE 0.9 % IV SOLN
Freq: Once | INTRAVENOUS | Status: AC
  Administered 2013-04-13: 10:00:00 via INTRAVENOUS

## 2013-04-13 MED ORDER — HEPARIN SOD (PORK) LOCK FLUSH 100 UNIT/ML IV SOLN
500.0000 [IU] | Freq: Once | INTRAVENOUS | Status: AC | PRN
  Administered 2013-04-13: 500 [IU]
  Filled 2013-04-13: qty 5

## 2013-04-13 MED ORDER — SODIUM CHLORIDE 0.9 % IJ SOLN
10.0000 mL | INTRAMUSCULAR | Status: DC | PRN
  Administered 2013-04-13: 10 mL
  Filled 2013-04-13: qty 10

## 2013-04-13 NOTE — Patient Instructions (Addendum)
[facility] Discharge Instructions for Patients Receiving Chemotherapy  Today you received the following chemotherapy agents Herceptin.      BELOW ARE SYMPTOMS THAT SHOULD BE REPORTED IMMEDIATELY:  *FEVER GREATER THAN 100.5 F  *CHILLS WITH OR WITHOUT FEVER  NAUSEA AND VOMITING THAT IS NOT CONTROLLED WITH YOUR NAUSEA MEDICATION  *UNUSUAL SHORTNESS OF BREATH  *UNUSUAL BRUISING OR BLEEDING  TENDERNESS IN MOUTH AND THROAT WITH OR WITHOUT PRESENCE OF ULCERS  *URINARY PROBLEMS  *BOWEL PROBLEMS  UNUSUAL RASH Items with * indicate a potential emergency and should be followed up as soon as possible.  Feel free to call the clinic you have any questions or concerns. The clinic phone number is [phone].    

## 2013-04-13 NOTE — Progress Notes (Signed)
OFFICE PROGRESS NOTE  CC  DOOLITTLE, Harrel Lemon, MD [address] [place] [address] Dr. Almond Lint Dr. Lurline Hare  DIAGNOSIS: 52 year old female with invasive ductal carcinoma of the right breast diagnosed December 2013.  PRIOR THERAPY:  #1 patient was originally seen in the multidisciplinary breast clinic on 12/17/2012 with a screening mammogram that showed an abnormality in the right breast. This abnormality measured 1.6 cm. She had a biopsy performed that showed invasive ductal carcinoma grade 2, ER negative, PR negative, HER-2 positive with a Ki-67 of 80%.  #2 patient is now status post right lumpectomy on 12/31/2012 with the pathology revealing a 8 cm invasive ductal carcinoma grade 3 with lymphovascular invasion, ER negative PR negative HER-2/neu positive with amplification of 4.36, 2 sentinel nodes were negative for metastatic disease. Patient did have positive margins and she will need a reexcision which will be performed on 01/14/2013. Patient has a Port-A-Cath in place.  #3 patient will proceed with adjuvant chemotherapy initially consisting of Taxotere carboplatinum and Herceptin. With Taxotere carboplatinum to be given every 21 days and Herceptin on a weekly basis for a total of 6 cycles of TC.we will plan on starting her first cycle of chemotherapy on 01/26/2013.  CURRENT THERAPY: TCH cycle 4 day 15 with weekly herceptin  INTERVAL HISTORY: Julia Zuniga 52 y.o. female returns for followup today.  She had an echo on 4/17 and appt with Dr. Gala Romney and was cleared to continue with Herceptin therapy.  She is doing well today.  Her diarrhea and mouth ulcers are improved.  Her neuropathy is worse in her feet and fingertips.  The neuropathy covers her entire feet, and she occasionally has difficulty buttoning and tying ribbons with her fingers.  Her left ankle remains swollen today.  She says its much better when she elevates it, but is ready to have an x ray.  Otherwise,  she denies fevers, chills, nausea, vomiting, constipation, or any further concerns.    MEDICAL HISTORY: Past Medical History  Diagnosis Date  . Breast cancer   . Hyperlipidemia   . Diverticulitis   . Hernia, hiatal   . Heartburn   . Anxiety   . Hot flashes   . PONV (postoperative nausea and vomiting)     ALLERGIES:  is allergic to chlorhexidine; codeine; medralone; and oxycodone-acetaminophen.  MEDICATIONS:  Current Outpatient Prescriptions  Medication Sig Dispense Refill  . atorvastatin (LIPITOR) 40 MG tablet Take 40 mg by mouth every evening.      . B Complex-C (SUPER B COMPLEX PO) Take 1 tablet by mouth daily.      . carvedilol (COREG) 6.25 MG tablet Take 1 tablet (6.25 mg total) by mouth 2 (two) times daily with a meal.  60 tablet  6  . gabapentin (NEURONTIN) 100 MG capsule Take 2 capsules three times a day  180 capsule  6  . ibuprofen (ADVIL,MOTRIN) 200 MG tablet Take 400 mg by mouth every 6 (six) hours as needed. Pain      . omeprazole (PRILOSEC) 40 MG capsule Take 1 capsule (40 mg total) by mouth daily.  90 capsule  2  . dexamethasone (DECADRON) 4 MG tablet Take 2 tablets (8 mg total) by mouth 2 (two) times daily with a meal. Take two times a day the day before Taxotere. Then take two times a day starting the day after chemo for 3 days.  30 tablet  1  . HYDROcodone-acetaminophen (NORCO) 5-325 MG per tablet Take 1-2 tablets by mouth every 6 (  six) hours as needed for pain.  15 tablet  1  . nystatin (MYCOSTATIN) 100000 UNIT/ML suspension Take 5 mLs (500,000 Units total) by mouth 4 (four) times daily.  60 mL  0  . ondansetron (ZOFRAN) 8 MG tablet Take 1 tablet (8 mg total) by mouth 2 (two) times daily. Take two times a day starting the day after chemo for 3 days. Then take two times a day as needed for nausea or vomiting.  30 tablet  1  . Probiotic Product (PROBIOTIC DAILY PO) Take 1 tablet by mouth daily.       . prochlorperazine (COMPAZINE) 25 MG suppository Place 1 suppository  (25 mg total) rectally every 12 (twelve) hours as needed for nausea.  12 suppository  3  . sucralfate (CARAFATE) 1 G tablet Take 1 tablet (1 g total) by mouth 4 (four) times daily.  120 tablet  5  . UNABLE TO FIND Cranial prosthesis due to chemotherapy induced hair loss.  1 Units  0  . valACYclovir (VALTREX) 500 MG tablet Take 1 tablet (500 mg total) by mouth 2 (two) times daily.  60 tablet  2   No current facility-administered medications for this visit.    SURGICAL HISTORY:  Past Surgical History  Procedure Laterality Date  . Cesarean section      x 3  . Tubal ligation    . Tonsillectomy and adenoidectomy    . Cholecystectomy  2001    lap choli  . Breast lumpectomy with sentinel lymph node biopsy  12/31/2012    Procedure: BREAST LUMPECTOMY WITH SENTINEL LYMPH NODE BX;  Surgeon: Almond Lint, MD;  Location: [facility];  Service: General;  Laterality: Right;  . Portacath placement  12/31/2012    Procedure: INSERTION PORT-A-CATH;  Surgeon: Almond Lint, MD;  Location: [facility];  Service: General;  Laterality: Left;  Left Subclavian Vein  . Re-excision of breast cancer,superior margins  01/14/2013    Procedure: RE-EXCISION OF BREAST CANCER,SUPERIOR MARGINS;  Surgeon: Almond Lint, MD;  Location: [facility];  Service: General;  Laterality: Right;  right breast re excision for markers     REVIEW OF SYSTEMS:   General: fatigue (+), night sweats (-), fever (-), pain (-) Lymph: palpable nodes (-) HEENT: vision changes (-), mucositis (+), gum bleeding (-), epistaxis (-) Cardiovascular: chest pain (-), palpitations (-) Pulmonary: shortness of breath (-), dyspnea on exertion (-), cough (-), hemoptysis (-) GI:  Early satiety (-), melena (-), dysphagia (-), nausea/vomiting (-), diarrhea (+) GU: dysuria (-), hematuria (-), incontinence (-) Musculoskeletal: joint swelling (-), joint pain (-), back pain (-) Neuro: weakness (-), numbness (+), headache (-), confusion  (-) Skin: Rash (-), lesions (-), dryness (-) Psych: depression (-), suicidal/homicidal ideation (-), feeling of hopelessness (-)  PHYSICAL EXAMINATION: Blood pressure 116/76, pulse 96, temperature 98.4 F (36.9 C), temperature source Oral, resp. rate 20, height 5\' 6"  (1.676 m), weight 235 lb 11.2 oz (106.913 kg), last menstrual period 01/07/2009. Body mass index is 38.06 kg/(m^2). General: Patient is a well appearing female in no acute distress HEENT: PERRLA, sclerae anicteric no conjunctival pallor, MMM, erythema and ulcerations on tongue, no white patches Neck: supple, no palpable adenopathy Lungs: clear to auscultation bilaterally, no wheezes, rhonchi, or rales Cardiovascular: regular rate rhythm, S1, S2, no murmurs, rubs or gallops Abdomen: Soft, non-tender, non-distended, normoactive bowel sounds, no HSM Extremities: warm and well perfused, no clubbing, cyanosis, or edema, left ankle swelling with ace wrap on.  Skin: No rashes or lesions  Neuro: Non-focal ECOG PERFORMANCE STATUS: 0 - Asymptomatic Right breast lumpectomy scar healed, no nodularity, left breast, no masses or nipple discharge   LABORATORY DATA: Lab Results  Component Value Date   WBC 7.8 04/13/2013   HGB 8.3* 04/13/2013   HCT 25.5* 04/13/2013   MCV 84.5 04/13/2013   PLT 124* 04/13/2013      Chemistry      Component Value Date/Time   NA 135* 04/06/2013 0904   NA 135 02/24/2013 1857   K 3.5 04/06/2013 0904   K 3.4* 02/24/2013 1857   CL 98 04/06/2013 0904   CL 97 02/24/2013 1857   CO2 27 04/06/2013 0904   CO2 29 02/24/2013 1857   BUN 11.9 04/06/2013 0904   BUN 6 02/24/2013 1857   CREATININE 0.6 04/06/2013 0904   CREATININE 0.55 02/24/2013 1857      Component Value Date/Time   CALCIUM 8.9 04/06/2013 0904   CALCIUM 9.2 02/24/2013 1857   ALKPHOS 108 04/06/2013 0904   ALKPHOS 113 02/24/2013 1857   AST 13 04/06/2013 0904   AST 18 02/24/2013 1857   ALT 32 04/06/2013 0904   ALT 30 02/24/2013 1857   BILITOT 0.63 04/06/2013 0904    BILITOT 0.2* 02/24/2013 1857    ADDITIONAL INFORMATION: 1. PROGNOSTIC INDICATORS - ACIS Results IMMUNOHISTOCHEMICAL AND MORPHOMETRIC ANALYSIS BY THE AUTOMATED CELLULAR IMAGING SYSTEM (ACIS) Estrogen Receptor (Negative, <1%): 0%, NEGATIVE Progesterone Receptor (Negative, <1%): 0%, NEGATIVE COMMENT: The negative hormone receptor study(ies) in this case have an internal positive control. All controls stained appropriately Pecola Leisure MD Pathologist, Electronic Signature ( Signed 01/07/2013) 1. CHROMOGENIC IN-SITU HYBRIDIZATION Interpretation: HER2/NEU BY CISH - SHOWS AMPLIFICATION BY CISH ANALYSIS. THE RATIO OF HER2: CEP 17 SIGNALS WAS 4.36. Reference range: Ratio: HER2:CEP17 < 1.8 gene amplification not observed Ratio: HER2:CEP 17 1.8-2.2 - equivocal result Ratio: HER2:CEP17 > 2.2 - gene amplification observed 1 of 4 FINAL for CYNAI, SKEENS ([id number]) ADDITIONAL INFORMATION:(continued) Pecola Leisure MD Pathologist, Electronic Signature ( Signed 01/07/2013) FINAL DIAGNOSIS Diagnosis 1. Breast, lumpectomy, Right - INVASIVE DUCTAL CARCINOMA, GRADE III (1.8 CM), SEE COMMENT. - LYMPHOVASCULAR INVASION IDENTIFIED. - INVASIVE TUMOR IS 1 MM TO NEAREST MARGIN (POSTERIOR). - DUCTAL CARCINOMA IN SITU, GRADE III WITH COMEDO NECROSIS. - IN SITU CARCINOMA IS LESS THAN 0.1 MM FROM ANTERIOR-INFERIOR AND POSTERIOR MARGINS. - SEE TUMOR TEMPLATE BELOW. 2. Lymph node, sentinel, biopsy, Right axillary - ONE LYMPH NODE, NEGATIVE FOR TUMOR (0/1), SEE COMMENT. 3. Lymph node, sentinel, biopsy, Right axillary - ONE LYMPH NODE, NEGATIVE FOR TUMOR (0/1), SEE COMMENT. Microscopic Comment 1. BREAST, INVASIVE TUMOR, WITH LYMPH NODE SAMPLING Specimen, including laterality: Right breast Procedure: Lumpectomy Grade: III of III Tubule formation: II Nuclear pleomorphism: III Mitotic:III Tumor size (gross measurement): 1.8 cm Margins: Invasive, distance to closest margin: 0.6 cm In-situ, distance to  closest margin: less than 0.1 cm If margin positive, focally or broadly: N/A Lymphovascular invasion: Present Ductal carcinoma in situ: Present Grade: III of III Extensive intraductal component: Absent Lobular neoplasia: Absent Tumor focality: Unifocal Treatment effect: None If present, treatment effect in breast tissue, lymph nodes or both: N/A Extent of tumor: Skin: N/A Nipple: N/A Skeletal muscle: N/A Lymph nodes: # examined: 2 Lymph nodes with metastasis: 0 Breast prognostic profile: Estrogen receptor: Repeated, previous study demonstrated 0% positivity ([id number]) 2 of 4 FINAL for Julia Zuniga, Julia Zuniga ([id number]) Microscopic Comment(continued) Progesterone receptor: Repeated, previous study demonstrated 0% positivity ([id number]) Her 2 neu: Repeated, previous study demonstrated amplification (3.88) ([id number]) Ki-67: Not repeated, previous study demonstrated 83%  proliferation rate [phone]) Non-neoplastic breast: Previous biopsy site, fibrocystic change and microcalcifications TNM: pT1c pN0 pMX   RADIOGRAPHIC STUDIES:  Mr Breast Bilateral W Wo Contrast  12/23/2012  *RADIOLOGY REPORT*  Clinical Data: recent diagnosis of invasive ductal carcinoma 12 o'clock right breast, for treatment planning  BILATERAL BREAST MRI WITH AND WITHOUT CONTRAST  Technique: Multiplanar, multisequence MR images of both breasts were obtained prior to and following the intravenous administration of 20ml of multihance.  Three dimensional images were evaluated at the independent DynaCad workstation.  Comparison:  mammographic and ultrasound images 12/08/12 and 11/20/12  Findings: There is mild background parenchymal enhancement.  The left breast is negative. There is no evidence of axillary or internal mammary adenopathy.  There is no abnormal skin or nipple enhancement.  On the right, associated with the recently placed biopsy marker clip, there is an enhancing spiculated mass in the 12 o'clock  position 11cm from the nipple.  The mass measures 23 x 12mm transverse dimension by 35mm craniocaudal dimension.  1cm anterior and slightly inferior to the mass is a second 7mm mass which is connected to the dominant mass by a thin bridge of enhancement. There are no other abnormalities in the right breast.  IMPRESSION: Biopsy-proven invasive carcinoma in the 12 o'clock position of the right breast, with an additional satellite focus as described above.  BI-RADS CATEGORY 6:  Known biopsy-proven malignancy - appropriate action should be taken.  THREE-DIMENSIONAL MR IMAGE RENDERING ON INDEPENDENT WORKSTATION:  Three-dimensional MR images were rendered by post-processing of the original MR data on an independent workstation.  The three- dimensional MR images were interpreted, and findings were reported in the accompanying complete MRI report for this study.   Original Report Authenticated By: Esperanza Heir, M.D.    Nm Sentinel Node Inj-no Rpt (breast)  12/31/2012  CLINICAL DATA: right breast cancer   Sulfur colloid was injected intradermally by the nuclear medicine  technologist for breast cancer sentinel node localization.     Dg Chest Port 1 View  12/31/2012  *RADIOLOGY REPORT*  Clinical Data: Port-A-Cath placement.  History of breast cancer.  PORTABLE CHEST - 1 VIEW  Comparison: 03/26/2011.  Findings: Central line has been placed from the left.  The tip is at the level of the distal superior vena cava/caval atrial junction.  No gross pneumothorax.  Consolidation medial aspect of the lung bases may be present possibly representing atelectasis or infiltrate.  Follow-up two- view chest with better inspiration recommended for further delineation.  Heart size top normal.  Mild central pulmonary vascular prominence.  Surgical clips project over the right lower chest and may be related to breast reconstruction/axillary lymph node dissection.  IMPRESSION: Central line has been placed from the left.  The tip is at the  level of the distal superior vena cava/caval atrial junction.  No gross pneumothorax.  Consolidation medial aspect of the lung bases may be present possibly representing atelectasis or infiltrate.  Follow-up two- view chest with better inspiration recommended for further delineation.   Original Report Authenticated By: Lacy Duverney, M.D.    Dg Fluoro Guide Cv Line-no Report  12/31/2012  CLINICAL DATA: portacath   FLOURO GUIDE CV LINE  Fluoroscopy was utilized by the requesting physician.  No radiographic  interpretation.      ASSESSMENT: 52 year old female with  #1 new diagnosis of invasive ductal carcinoma of the right breast she is now status post lumpectomy with sentinel lymph node biopsy with the final pathology revealing a 1.8 cm invasive ductal carcinoma  grade 3 ER negative PR negative HER-2/neu amplified at 4.36. 2 sentinel nodes were negative for metastatic disease. There was noted to be lymphovascular invasion. Postoperatively patient is doing well. She will go for a reexcision for positive margin.  #2 subsequently begun on adjuvant chemotherapy consisting of Taxotere carboplatinum and Herceptin starting 01/26/2013. Total of 6 cycles of TCH combination is planned. Her dose of Taxotere was reduced with cycle 3.  #3 reflux which she has a chronic history. But it is worsened since starting her chemotherapy.  #4 rash on her scapula.  #5 neuropathy    PLAN:   #1 Doing well, labs look good.  Proceed with Herceptin.    #2 she will return in one week's time for Herceptin.  #3 She will continue Valtrex BID and Nystatin/Biotene mouth rinses for her mucositis.   #4 I offered Ms. Zarling an x ray again of her ankle.  She will proceed with this today.    #5 She will take Neurontin 300mg  TID scheduled.  We will evaluate her next week, and if it is worse, we will hold her treatment.     All questions were answered. The patient knows to call the clinic with any problems, questions or concerns.  We can certainly see the patient much sooner if necessary.  I spent 25 minutes counseling the patient face to face. The total time spent in the appointment was 30 minutes.  Cherie Ouch Lyn Hollingshead, NP Medical Oncology [facility] Phone: [phone] 04/13/2013, 9:02 AM

## 2013-04-13 NOTE — Patient Instructions (Signed)
Doing well.  Proceed with Herceptin.  Take Neurontin 3 tablets 3 times a day.  We will re-evaluate your neuropathy next week.  Please call us if you have any questions or concerns.

## 2013-04-13 NOTE — Progress Notes (Signed)
Pt refused benadryl, states it makes her too groggy.

## 2013-04-13 NOTE — Telephone Encounter (Signed)
Pt called requesting results to her xray of ankle from today. Informed pt results show soft tissue swelling without bony abnormality/fractures. Patient verbalized thanks. No further questions at this time. Encouraged pt to keep foot elevated as much as possible. Patient to call office should she have any further questions.

## 2013-04-17 ENCOUNTER — Telehealth: Payer: Self-pay | Admitting: Oncology

## 2013-04-17 ENCOUNTER — Other Ambulatory Visit: Payer: Self-pay | Admitting: Medical Oncology

## 2013-04-17 DIAGNOSIS — C50411 Malignant neoplasm of upper-outer quadrant of right female breast: Secondary | ICD-10-CM

## 2013-04-20 ENCOUNTER — Encounter: Payer: Self-pay | Admitting: Adult Health

## 2013-04-20 ENCOUNTER — Ambulatory Visit (HOSPITAL_BASED_OUTPATIENT_CLINIC_OR_DEPARTMENT_OTHER): Payer: BC Managed Care – PPO | Admitting: Adult Health

## 2013-04-20 ENCOUNTER — Ambulatory Visit (HOSPITAL_BASED_OUTPATIENT_CLINIC_OR_DEPARTMENT_OTHER): Payer: BC Managed Care – PPO

## 2013-04-20 ENCOUNTER — Other Ambulatory Visit: Payer: BC Managed Care – PPO | Admitting: Lab

## 2013-04-20 ENCOUNTER — Other Ambulatory Visit (HOSPITAL_BASED_OUTPATIENT_CLINIC_OR_DEPARTMENT_OTHER): Payer: BC Managed Care – PPO | Admitting: Lab

## 2013-04-20 VITALS — BP 133/81 | HR 99 | Temp 98.3°F | Resp 18 | Ht 66.0 in | Wt 235.8 lb

## 2013-04-20 DIAGNOSIS — C50919 Malignant neoplasm of unspecified site of unspecified female breast: Secondary | ICD-10-CM

## 2013-04-20 DIAGNOSIS — K219 Gastro-esophageal reflux disease without esophagitis: Secondary | ICD-10-CM

## 2013-04-20 DIAGNOSIS — C50411 Malignant neoplasm of upper-outer quadrant of right female breast: Secondary | ICD-10-CM

## 2013-04-20 DIAGNOSIS — G589 Mononeuropathy, unspecified: Secondary | ICD-10-CM

## 2013-04-20 DIAGNOSIS — Z5112 Encounter for antineoplastic immunotherapy: Secondary | ICD-10-CM

## 2013-04-20 DIAGNOSIS — Z171 Estrogen receptor negative status [ER-]: Secondary | ICD-10-CM

## 2013-04-20 LAB — COMPREHENSIVE METABOLIC PANEL (CC13)
ALT: 47 U/L (ref 0–55)
AST: 22 U/L (ref 5–34)
Albumin: 3.9 g/dL (ref 3.5–5.0)
Alkaline Phosphatase: 114 U/L (ref 40–150)
BUN: 12.4 mg/dL (ref 7.0–26.0)
CO2: 23 mEq/L (ref 22–29)
Calcium: 9.8 mg/dL (ref 8.4–10.4)
Chloride: 105 mEq/L (ref 98–107)
Creatinine: 0.6 mg/dL (ref 0.6–1.1)
Glucose: 175 mg/dl — ABNORMAL HIGH (ref 70–99)
Potassium: 4.2 mEq/L (ref 3.5–5.1)
Sodium: 139 mEq/L (ref 136–145)
Total Bilirubin: 0.46 mg/dL (ref 0.20–1.20)
Total Protein: 7.3 g/dL (ref 6.4–8.3)

## 2013-04-20 LAB — CBC WITH DIFFERENTIAL/PLATELET
BASO%: 0 % (ref 0.0–2.0)
Basophils Absolute: 0 10*3/uL (ref 0.0–0.1)
EOS%: 0.2 % (ref 0.0–7.0)
Eosinophils Absolute: 0 10*3/uL (ref 0.0–0.5)
HCT: 29.1 % — ABNORMAL LOW (ref 34.8–46.6)
HGB: 9.1 g/dL — ABNORMAL LOW (ref 11.6–15.9)
LYMPH%: 13.3 % — ABNORMAL LOW (ref 14.0–49.7)
MCH: 27.5 pg (ref 25.1–34.0)
MCHC: 31.3 g/dL — ABNORMAL LOW (ref 31.5–36.0)
MCV: 87.9 fL (ref 79.5–101.0)
MONO#: 0 10*3/uL — ABNORMAL LOW (ref 0.1–0.9)
MONO%: 0.5 % (ref 0.0–14.0)
NEUT#: 5 10*3/uL (ref 1.5–6.5)
NEUT%: 86 % — ABNORMAL HIGH (ref 38.4–76.8)
Platelets: 206 10*3/uL (ref 145–400)
RBC: 3.31 10*6/uL — ABNORMAL LOW (ref 3.70–5.45)
RDW: 18.6 % — ABNORMAL HIGH (ref 11.2–14.5)
WBC: 5.8 10*3/uL (ref 3.9–10.3)
lymph#: 0.8 10*3/uL — ABNORMAL LOW (ref 0.9–3.3)
nRBC: 0 % (ref 0–0)

## 2013-04-20 MED ORDER — SODIUM CHLORIDE 0.9 % IV SOLN
2.0000 mg/kg | Freq: Once | INTRAVENOUS | Status: AC
  Administered 2013-04-20: 210 mg via INTRAVENOUS
  Filled 2013-04-20: qty 10

## 2013-04-20 MED ORDER — ACETAMINOPHEN 325 MG PO TABS
650.0000 mg | ORAL_TABLET | Freq: Once | ORAL | Status: AC
  Administered 2013-04-20: 650 mg via ORAL

## 2013-04-20 MED ORDER — HEPARIN SOD (PORK) LOCK FLUSH 100 UNIT/ML IV SOLN
500.0000 [IU] | Freq: Once | INTRAVENOUS | Status: AC | PRN
  Administered 2013-04-20: 500 [IU]
  Filled 2013-04-20: qty 5

## 2013-04-20 MED ORDER — SODIUM CHLORIDE 0.9 % IJ SOLN
10.0000 mL | INTRAMUSCULAR | Status: DC | PRN
Start: 2013-04-20 — End: 2013-04-20
  Administered 2013-04-20: 10 mL
  Filled 2013-04-20: qty 10

## 2013-04-20 MED ORDER — SODIUM CHLORIDE 0.9 % IV SOLN
Freq: Once | INTRAVENOUS | Status: AC
  Administered 2013-04-20: 10:00:00 via INTRAVENOUS

## 2013-04-20 NOTE — Progress Notes (Signed)
OFFICE PROGRESS NOTE  CC  DOOLITTLE, Harrel Lemon, MD [address] [place] [address] Dr. Almond Lint Dr. Lurline Hare  DIAGNOSIS: 52 year old female with invasive ductal carcinoma of the right breast diagnosed December 2013.  PRIOR THERAPY:  #1 patient was originally seen in the multidisciplinary breast clinic on 12/17/2012 with a screening mammogram that showed an abnormality in the right breast. This abnormality measured 1.6 cm. She had a biopsy performed that showed invasive ductal carcinoma grade 2, ER negative, PR negative, HER-2 positive with a Ki-67 of 80%.  #2 patient is now status post right lumpectomy on 12/31/2012 with the pathology revealing a 8 cm invasive ductal carcinoma grade 3 with lymphovascular invasion, ER negative PR negative HER-2/neu positive with amplification of 4.36, 2 sentinel nodes were negative for metastatic disease. Patient did have positive margins and she will need a reexcision which will be performed on 01/14/2013. Patient has a Port-A-Cath in place.  #3 patient will proceed with adjuvant chemotherapy initially consisting of Taxotere carboplatinum and Herceptin. With Taxotere carboplatinum to be given every 21 days and Herceptin on a weekly basis for a total of 6 cycles of TC.we will plan on starting her first cycle of chemotherapy on 01/26/2013. She stopped Taxotere carboplatin after 4 cycles, it was discontinued due to worsening neuropathy.    CURRENT THERAPY: TCH cycle 5 day 1 with weekly herceptin  INTERVAL HISTORY: Julia Zuniga 52 y.o. female returns for followup today.  She had an echo on 4/17 and appt with Dr. Gala Romney and was cleared to continue with Herceptin therapy.  She is doing well today.  Her diarrhea and mouth ulcers are improved.  Her neuropathy is no better since increasing the Neurontin.  She is having motor disfunction that is no better.  She denies fevers, chills, nausea, vomiting, constipation, diarrhea.    MEDICAL  HISTORY: Past Medical History  Diagnosis Date  . Breast cancer   . Hyperlipidemia   . Diverticulitis   . Hernia, hiatal   . Heartburn   . Anxiety   . Hot flashes   . PONV (postoperative nausea and vomiting)     ALLERGIES:  is allergic to chlorhexidine; codeine; medralone; and oxycodone-acetaminophen.  MEDICATIONS:  Current Outpatient Prescriptions  Medication Sig Dispense Refill  . atorvastatin (LIPITOR) 40 MG tablet Take 40 mg by mouth every evening.      . B Complex-C (SUPER B COMPLEX PO) Take 1 tablet by mouth daily.      . carvedilol (COREG) 6.25 MG tablet Take 1 tablet (6.25 mg total) by mouth 2 (two) times daily with a meal.  60 tablet  6  . dexamethasone (DECADRON) 4 MG tablet Take 2 tablets (8 mg total) by mouth 2 (two) times daily with a meal. Take two times a day the day before Taxotere. Then take two times a day starting the day after chemo for 3 days.  30 tablet  1  . gabapentin (NEURONTIN) 100 MG capsule Take 2 capsules three times a day  180 capsule  6  . ibuprofen (ADVIL,MOTRIN) 200 MG tablet Take 400 mg by mouth every 6 (six) hours as needed. Pain      . omeprazole (PRILOSEC) 40 MG capsule Take 1 capsule (40 mg total) by mouth daily.  90 capsule  2  . UNABLE TO FIND Cranial prosthesis due to chemotherapy induced hair loss.  1 Units  0  . HYDROcodone-acetaminophen (NORCO) 5-325 MG per tablet Take 1-2 tablets by mouth every 6 (six) hours as needed  for pain.  15 tablet  1  . nystatin (MYCOSTATIN) 100000 UNIT/ML suspension Take 5 mLs (500,000 Units total) by mouth 4 (four) times daily.  60 mL  0  . ondansetron (ZOFRAN) 8 MG tablet Take 1 tablet (8 mg total) by mouth 2 (two) times daily. Take two times a day starting the day after chemo for 3 days. Then take two times a day as needed for nausea or vomiting.  30 tablet  1  . Probiotic Product (PROBIOTIC DAILY PO) Take 1 tablet by mouth daily.       . prochlorperazine (COMPAZINE) 25 MG suppository Place 1 suppository (25 mg  total) rectally every 12 (twelve) hours as needed for nausea.  12 suppository  3  . sucralfate (CARAFATE) 1 G tablet Take 1 tablet (1 g total) by mouth 4 (four) times daily.  120 tablet  5  . valACYclovir (VALTREX) 500 MG tablet Take 1 tablet (500 mg total) by mouth 2 (two) times daily.  60 tablet  2   No current facility-administered medications for this visit.    SURGICAL HISTORY:  Past Surgical History  Procedure Laterality Date  . Cesarean section      x 3  . Tubal ligation    . Tonsillectomy and adenoidectomy    . Cholecystectomy  2001    lap choli  . Breast lumpectomy with sentinel lymph node biopsy  12/31/2012    Procedure: BREAST LUMPECTOMY WITH SENTINEL LYMPH NODE BX;  Surgeon: Almond Lint, MD;  Location: [facility];  Service: General;  Laterality: Right;  . Portacath placement  12/31/2012    Procedure: INSERTION PORT-A-CATH;  Surgeon: Almond Lint, MD;  Location: [facility];  Service: General;  Laterality: Left;  Left Subclavian Vein  . Re-excision of breast cancer,superior margins  01/14/2013    Procedure: RE-EXCISION OF BREAST CANCER,SUPERIOR MARGINS;  Surgeon: Almond Lint, MD;  Location: [facility];  Service: General;  Laterality: Right;  right breast re excision for markers     REVIEW OF SYSTEMS:   General: fatigue (+), night sweats (-), fever (-), pain (-) Lymph: palpable nodes (-) HEENT: vision changes (-), mucositis (+), gum bleeding (-), epistaxis (-) Cardiovascular: chest pain (-), palpitations (-) Pulmonary: shortness of breath (-), dyspnea on exertion (-), cough (-), hemoptysis (-) GI:  Early satiety (-), melena (-), dysphagia (-), nausea/vomiting (-), diarrhea (+) GU: dysuria (-), hematuria (-), incontinence (-) Musculoskeletal: joint swelling (-), joint pain (-), back pain (-) Neuro: weakness (-), numbness (+), headache (-), confusion (-) Skin: Rash (-), lesions (-), dryness (-) Psych: depression (-), suicidal/homicidal ideation (-),  feeling of hopelessness (-)  PHYSICAL EXAMINATION: Blood pressure 133/81, pulse 99, temperature 98.3 F (36.8 C), temperature source Oral, resp. rate 18, height 5\' 6"  (1.676 m), weight 235 lb 12.8 oz (106.958 kg), last menstrual period 01/07/2009. Body mass index is 38.08 kg/(m^2). General: Patient is a well appearing female in no acute distress HEENT: PERRLA, sclerae anicteric no conjunctival pallor, MMM, erythema and ulcerations on tongue, no white patches Neck: supple, no palpable adenopathy Lungs: clear to auscultation bilaterally, no wheezes, rhonchi, or rales Cardiovascular: regular rate rhythm, S1, S2, no murmurs, rubs or gallops Abdomen: Soft, non-tender, non-distended, normoactive bowel sounds, no HSM Extremities: warm and well perfused, no clubbing, cyanosis, or edema, left ankle swelling with ace wrap on.  Skin: No rashes or lesions Neuro: Non-focal ECOG PERFORMANCE STATUS: 0 - Asymptomatic Right breast lumpectomy scar healed, no nodularity, left breast, no masses or nipple discharge  LABORATORY DATA: Lab Results  Component Value Date   WBC 5.8 04/20/2013   HGB 9.1* 04/20/2013   HCT 29.1* 04/20/2013   MCV 87.9 04/20/2013   PLT 206 04/20/2013      Chemistry      Component Value Date/Time   NA 142 04/13/2013 0818   NA 135 02/24/2013 1857   K 3.9 04/13/2013 0818   K 3.4* 02/24/2013 1857   CL 108* 04/13/2013 0818   CL 97 02/24/2013 1857   CO2 25 04/13/2013 0818   CO2 29 02/24/2013 1857   BUN 8.7 04/13/2013 0818   BUN 6 02/24/2013 1857   CREATININE 0.5* 04/13/2013 0818   CREATININE 0.55 02/24/2013 1857      Component Value Date/Time   CALCIUM 8.8 04/13/2013 0818   CALCIUM 9.2 02/24/2013 1857   ALKPHOS 91 04/13/2013 0818   ALKPHOS 113 02/24/2013 1857   AST 18 04/13/2013 0818   AST 18 02/24/2013 1857   ALT 32 04/13/2013 0818   ALT 30 02/24/2013 1857   BILITOT 0.54 04/13/2013 0818   BILITOT 0.2* 02/24/2013 1857    ADDITIONAL INFORMATION: 1. PROGNOSTIC INDICATORS -  ACIS Results IMMUNOHISTOCHEMICAL AND MORPHOMETRIC ANALYSIS BY THE AUTOMATED CELLULAR IMAGING SYSTEM (ACIS) Estrogen Receptor (Negative, <1%): 0%, NEGATIVE Progesterone Receptor (Negative, <1%): 0%, NEGATIVE COMMENT: The negative hormone receptor study(ies) in this case have an internal positive control. All controls stained appropriately Pecola Leisure MD Pathologist, Electronic Signature ( Signed 01/07/2013) 1. CHROMOGENIC IN-SITU HYBRIDIZATION Interpretation: HER2/NEU BY CISH - SHOWS AMPLIFICATION BY CISH ANALYSIS. THE RATIO OF HER2: CEP 17 SIGNALS WAS 4.36. Reference range: Ratio: HER2:CEP17 < 1.8 gene amplification not observed Ratio: HER2:CEP 17 1.8-2.2 - equivocal result Ratio: HER2:CEP17 > 2.2 - gene amplification observed 1 of 4 FINAL for NAIDELYN, PARRELLA ([id number]) ADDITIONAL INFORMATION:(continued) Pecola Leisure MD Pathologist, Electronic Signature ( Signed 01/07/2013) FINAL DIAGNOSIS Diagnosis 1. Breast, lumpectomy, Right - INVASIVE DUCTAL CARCINOMA, GRADE III (1.8 CM), SEE COMMENT. - LYMPHOVASCULAR INVASION IDENTIFIED. - INVASIVE TUMOR IS 1 MM TO NEAREST MARGIN (POSTERIOR). - DUCTAL CARCINOMA IN SITU, GRADE III WITH COMEDO NECROSIS. - IN SITU CARCINOMA IS LESS THAN 0.1 MM FROM ANTERIOR-INFERIOR AND POSTERIOR MARGINS. - SEE TUMOR TEMPLATE BELOW. 2. Lymph node, sentinel, biopsy, Right axillary - ONE LYMPH NODE, NEGATIVE FOR TUMOR (0/1), SEE COMMENT. 3. Lymph node, sentinel, biopsy, Right axillary - ONE LYMPH NODE, NEGATIVE FOR TUMOR (0/1), SEE COMMENT. Microscopic Comment 1. BREAST, INVASIVE TUMOR, WITH LYMPH NODE SAMPLING Specimen, including laterality: Right breast Procedure: Lumpectomy Grade: III of III Tubule formation: II Nuclear pleomorphism: III Mitotic:III Tumor size (gross measurement): 1.8 cm Margins: Invasive, distance to closest margin: 0.6 cm In-situ, distance to closest margin: less than 0.1 cm If margin positive, focally or broadly:  N/A Lymphovascular invasion: Present Ductal carcinoma in situ: Present Grade: III of III Extensive intraductal component: Absent Lobular neoplasia: Absent Tumor focality: Unifocal Treatment effect: None If present, treatment effect in breast tissue, lymph nodes or both: N/A Extent of tumor: Skin: N/A Nipple: N/A Skeletal muscle: N/A Lymph nodes: # examined: 2 Lymph nodes with metastasis: 0 Breast prognostic profile: Estrogen receptor: Repeated, previous study demonstrated 0% positivity ([id number]) 2 of 4 FINAL for SHAAKIRA, BORRERO ([id number]) Microscopic Comment(continued) Progesterone receptor: Repeated, previous study demonstrated 0% positivity ([id number]) Her 2 neu: Repeated, previous study demonstrated amplification (3.88) ([id number]) Ki-67: Not repeated, previous study demonstrated 83% proliferation rate ([id number]) Non-neoplastic breast: Previous biopsy site, fibrocystic change and microcalcifications TNM: pT1c pN0 pMX   RADIOGRAPHIC STUDIES:  Mr Breast Bilateral  W Wo Contrast  12/23/2012  *RADIOLOGY REPORT*  Clinical Data: recent diagnosis of invasive ductal carcinoma 12 o'clock right breast, for treatment planning  BILATERAL BREAST MRI WITH AND WITHOUT CONTRAST  Technique: Multiplanar, multisequence MR images of both breasts were obtained prior to and following the intravenous administration of 20ml of multihance.  Three dimensional images were evaluated at the independent DynaCad workstation.  Comparison:  mammographic and ultrasound images 12/08/12 and 11/20/12  Findings: There is mild background parenchymal enhancement.  The left breast is negative. There is no evidence of axillary or internal mammary adenopathy.  There is no abnormal skin or nipple enhancement.  On the right, associated with the recently placed biopsy marker clip, there is an enhancing spiculated mass in the 12 o'clock position 11cm from the nipple.  The mass measures 23 x 12mm transverse dimension  by 35mm craniocaudal dimension.  1cm anterior and slightly inferior to the mass is a second 7mm mass which is connected to the dominant mass by a thin bridge of enhancement. There are no other abnormalities in the right breast.  IMPRESSION: Biopsy-proven invasive carcinoma in the 12 o'clock position of the right breast, with an additional satellite focus as described above.  BI-RADS CATEGORY 6:  Known biopsy-proven malignancy - appropriate action should be taken.  THREE-DIMENSIONAL MR IMAGE RENDERING ON INDEPENDENT WORKSTATION:  Three-dimensional MR images were rendered by post-processing of the original MR data on an independent workstation.  The three- dimensional MR images were interpreted, and findings were reported in the accompanying complete MRI report for this study.   Original Report Authenticated By: Esperanza Heir, M.D.    Nm Sentinel Node Inj-no Rpt (breast)  12/31/2012  CLINICAL DATA: right breast cancer   Sulfur colloid was injected intradermally by the nuclear medicine  technologist for breast cancer sentinel node localization.     Dg Chest Port 1 View  12/31/2012  *RADIOLOGY REPORT*  Clinical Data: Port-A-Cath placement.  History of breast cancer.  PORTABLE CHEST - 1 VIEW  Comparison: 03/26/2011.  Findings: Central line has been placed from the left.  The tip is at the level of the distal superior vena cava/caval atrial junction.  No gross pneumothorax.  Consolidation medial aspect of the lung bases may be present possibly representing atelectasis or infiltrate.  Follow-up two- view chest with better inspiration recommended for further delineation.  Heart size top normal.  Mild central pulmonary vascular prominence.  Surgical clips project over the right lower chest and may be related to breast reconstruction/axillary lymph node dissection.  IMPRESSION: Central line has been placed from the left.  The tip is at the level of the distal superior vena cava/caval atrial junction.  No gross  pneumothorax.  Consolidation medial aspect of the lung bases may be present possibly representing atelectasis or infiltrate.  Follow-up two- view chest with better inspiration recommended for further delineation.   Original Report Authenticated By: Lacy Duverney, M.D.    Dg Fluoro Guide Cv Line-no Report  12/31/2012  CLINICAL DATA: portacath   FLOURO GUIDE CV LINE  Fluoroscopy was utilized by the requesting physician.  No radiographic  interpretation.      ASSESSMENT: 52 year old female with  #1 new diagnosis of invasive ductal carcinoma of the right breast she is now status post lumpectomy with sentinel lymph node biopsy with the final pathology revealing a 1.8 cm invasive ductal carcinoma grade 3 ER negative PR negative HER-2/neu amplified at 4.36. 2 sentinel nodes were negative for metastatic disease. There was noted to be lymphovascular  invasion. Postoperatively patient is doing well. She will go for a reexcision for positive margin.  #2 subsequently begun on adjuvant chemotherapy consisting of Taxotere carboplatinum and Herceptin starting 01/26/2013. Total of 6 cycles of TCH combination is planned. Her dose of Taxotere was reduced with cycle 3.  #3 reflux which she has a chronic history. But it is worsened since starting her chemotherapy.  #4 rash on her scapula.  #5 neuropathy    PLAN:   #1 Doing well, labs look good.  We will discontinue the last two cycles of Taxotere Carboplatin.  She will proceed with Herceptin only today.    #2 She will take Neurontin 300mg  TID scheduled.  She will continue this.    #3 She will follow up with Dr. Welton Flakes next week regarding future treatments and if she can receive a different type of chemotherapy.    All questions were answered. The patient knows to call the clinic with any problems, questions or concerns. We can certainly see the patient much sooner if necessary.  I spent 25 minutes counseling the patient face to face. The total time spent in the  appointment was 30 minutes.  Cherie Ouch Lyn Hollingshead, NP Medical Oncology [facility] Phone: [phone] 04/20/2013, 8:58 AM

## 2013-04-20 NOTE — Patient Instructions (Signed)
Proceed with Herceptin only today.  We will see you back next week for evaluation.  Continue Neurontin three times a day.  Please call us if you have any questions or concerns.

## 2013-04-20 NOTE — Patient Instructions (Addendum)

## 2013-04-21 ENCOUNTER — Ambulatory Visit: Payer: BC Managed Care – PPO

## 2013-04-22 ENCOUNTER — Other Ambulatory Visit: Payer: Self-pay | Admitting: Certified Registered Nurse Anesthetist

## 2013-04-27 ENCOUNTER — Encounter: Payer: Self-pay | Admitting: Oncology

## 2013-04-27 ENCOUNTER — Ambulatory Visit (HOSPITAL_BASED_OUTPATIENT_CLINIC_OR_DEPARTMENT_OTHER): Payer: BC Managed Care – PPO

## 2013-04-27 ENCOUNTER — Other Ambulatory Visit: Payer: BC Managed Care – PPO | Admitting: Lab

## 2013-04-27 ENCOUNTER — Ambulatory Visit (HOSPITAL_BASED_OUTPATIENT_CLINIC_OR_DEPARTMENT_OTHER): Payer: BC Managed Care – PPO | Admitting: Oncology

## 2013-04-27 ENCOUNTER — Other Ambulatory Visit (HOSPITAL_BASED_OUTPATIENT_CLINIC_OR_DEPARTMENT_OTHER): Payer: BC Managed Care – PPO | Admitting: Lab

## 2013-04-27 VITALS — BP 131/79 | HR 91 | Temp 98.6°F | Resp 20 | Ht 66.0 in | Wt 237.8 lb

## 2013-04-27 DIAGNOSIS — C50919 Malignant neoplasm of unspecified site of unspecified female breast: Secondary | ICD-10-CM

## 2013-04-27 DIAGNOSIS — Z171 Estrogen receptor negative status [ER-]: Secondary | ICD-10-CM

## 2013-04-27 DIAGNOSIS — C50411 Malignant neoplasm of upper-outer quadrant of right female breast: Secondary | ICD-10-CM

## 2013-04-27 DIAGNOSIS — Z5112 Encounter for antineoplastic immunotherapy: Secondary | ICD-10-CM

## 2013-04-27 DIAGNOSIS — K219 Gastro-esophageal reflux disease without esophagitis: Secondary | ICD-10-CM

## 2013-04-27 DIAGNOSIS — G589 Mononeuropathy, unspecified: Secondary | ICD-10-CM

## 2013-04-27 LAB — CBC WITH DIFFERENTIAL/PLATELET
Eosinophils Absolute: 0.2 10*3/uL (ref 0.0–0.5)
MONO#: 0.6 10*3/uL (ref 0.1–0.9)
NEUT#: 3.7 10*3/uL (ref 1.5–6.5)
RBC: 3.26 10*6/uL — ABNORMAL LOW (ref 3.70–5.45)
RDW: 17.2 % — ABNORMAL HIGH (ref 11.2–14.5)
WBC: 7.1 10*3/uL (ref 3.9–10.3)
nRBC: 0 % (ref 0–0)

## 2013-04-27 LAB — COMPREHENSIVE METABOLIC PANEL (CC13)
ALT: 32 U/L (ref 0–55)
Albumin: 3.5 g/dL (ref 3.5–5.0)
Alkaline Phosphatase: 109 U/L (ref 40–150)
Glucose: 102 mg/dl — ABNORMAL HIGH (ref 70–99)
Potassium: 4.1 mEq/L (ref 3.5–5.1)
Sodium: 141 mEq/L (ref 136–145)
Total Bilirubin: 0.52 mg/dL (ref 0.20–1.20)
Total Protein: 6.4 g/dL (ref 6.4–8.3)

## 2013-04-27 MED ORDER — TRASTUZUMAB CHEMO INJECTION 440 MG
2.0000 mg/kg | Freq: Once | INTRAVENOUS | Status: AC
  Administered 2013-04-27: 210 mg via INTRAVENOUS
  Filled 2013-04-27: qty 10

## 2013-04-27 MED ORDER — SODIUM CHLORIDE 0.9 % IV SOLN
Freq: Once | INTRAVENOUS | Status: AC
  Administered 2013-04-27: 10:00:00 via INTRAVENOUS

## 2013-04-27 MED ORDER — HYDROCODONE-ACETAMINOPHEN 5-325 MG PO TABS: 1.0000 | ORAL_TABLET | Freq: Four times a day (QID) | ORAL | Status: DC | PRN

## 2013-04-27 MED ORDER — SODIUM CHLORIDE 0.9 % IJ SOLN
10.0000 mL | INTRAMUSCULAR | Status: DC | PRN
  Administered 2013-04-27: 10 mL
  Filled 2013-04-27: qty 10

## 2013-04-27 MED ORDER — DULOXETINE HCL 20 MG PO CPEP: 20.0000 mg | ORAL_CAPSULE | Freq: Every day | ORAL | Status: DC

## 2013-04-27 MED ORDER — HEPARIN SOD (PORK) LOCK FLUSH 100 UNIT/ML IV SOLN
500.0000 [IU] | Freq: Once | INTRAVENOUS | Status: AC | PRN
  Administered 2013-04-27: 500 [IU]
  Filled 2013-04-27: qty 5

## 2013-04-27 MED ORDER — ACETAMINOPHEN 325 MG PO TABS
650.0000 mg | ORAL_TABLET | Freq: Once | ORAL | Status: AC
  Administered 2013-04-27: 650 mg via ORAL

## 2013-04-27 NOTE — Progress Notes (Signed)
OFFICE PROGRESS NOTE  CC  DOOLITTLE, Harrel Lemon, MD [address] [place] [address] Dr. Almond Lint Dr. Lurline Hare  DIAGNOSIS: 52 year old female with invasive ductal carcinoma of the right breast diagnosed December 2013.  PRIOR THERAPY:  #1 patient was originally seen in the multidisciplinary breast clinic on 12/17/2012 with a screening mammogram that showed an abnormality in the right breast. This abnormality measured 1.6 cm. She had a biopsy performed that showed invasive ductal carcinoma grade 2, ER negative, PR negative, HER-2 positive with a Ki-67 of 80%.  #2 patient is now status post right lumpectomy on 12/31/2012 with the pathology revealing a 8 cm invasive ductal carcinoma grade 3 with lymphovascular invasion, ER negative PR negative HER-2/neu positive with amplification of 4.36, 2 sentinel nodes were negative for metastatic disease. Patient did have positive margins and she will need a reexcision which will be performed on 01/14/2013. Patient has a Port-A-Cath in place.  #3 patient will proceed with adjuvant chemotherapy initially consisting of Taxotere carboplatinum and Herceptin. With Taxotere carboplatinum to be given every 21 days and Herceptin on a weekly basis for a total of 6 cycles of TC.we will plan on starting her first cycle of chemotherapy on 01/26/2013 - 04/20/13. She stopped Taxotere carboplatin after 4 cycles, it was discontinued due to worsening neuropathy.    CURRENT THERAPY: Herceptin weekly  INTERVAL HISTORY: Julia Zuniga 52 y.o. female returns for followup today. She still continues to suffer significantly from neuropathy aches pains fatigue. Her chemotherapy was discontinued on 04/20/2013. She is getting Herceptin only now. We had an extensive discussion today regarding stopping chemotherapy. Of note her tumor is ER negative PR negative HER-2/neu positive. Unfortunately she does not want any further chemotherapy due to worsening neuropathies and in  tolerance of chemotherapy in general. She does need radiation therapy and she will be referred. She would like to see Dr. Chipper Herb and I have made a referral to him.  MEDICAL HISTORY: Past Medical History  Diagnosis Date  . Breast cancer   . Hyperlipidemia   . Diverticulitis   . Hernia, hiatal   . Heartburn   . Anxiety   . Hot flashes   . PONV (postoperative nausea and vomiting)     ALLERGIES:  is allergic to chlorhexidine; codeine; medralone; and oxycodone-acetaminophen.  MEDICATIONS:  Current Outpatient Prescriptions  Medication Sig Dispense Refill  . atorvastatin (LIPITOR) 40 MG tablet Take 40 mg by mouth every evening.      . B Complex-C (SUPER B COMPLEX PO) Take 1 tablet by mouth daily.      . carvedilol (COREG) 6.25 MG tablet Take 1 tablet (6.25 mg total) by mouth 2 (two) times daily with a meal.  60 tablet  6  . gabapentin (NEURONTIN) 100 MG capsule Take 2 capsules three times a day  180 capsule  6  . HYDROcodone-acetaminophen (NORCO) 5-325 MG per tablet Take 1-2 tablets by mouth every 6 (six) hours as needed for pain.  15 tablet  1  . ibuprofen (ADVIL,MOTRIN) 200 MG tablet Take 400 mg by mouth every 6 (six) hours as needed. Pain      . omeprazole (PRILOSEC) 40 MG capsule Take 1 capsule (40 mg total) by mouth daily.  90 capsule  2  . UNABLE TO FIND Cranial prosthesis due to chemotherapy induced hair loss.  1 Units  0  . dexamethasone (DECADRON) 4 MG tablet Take 2 tablets (8 mg total) by mouth 2 (two) times daily with a meal. Take two  times a day the day before Taxotere. Then take two times a day starting the day after chemo for 3 days.  30 tablet  1  . nystatin (MYCOSTATIN) 100000 UNIT/ML suspension Take 5 mLs (500,000 Units total) by mouth 4 (four) times daily.  60 mL  0  . ondansetron (ZOFRAN) 8 MG tablet Take 1 tablet (8 mg total) by mouth 2 (two) times daily. Take two times a day starting the day after chemo for 3 days. Then take two times a day as needed for nausea or  vomiting.  30 tablet  1  . Probiotic Product (PROBIOTIC DAILY PO) Take 1 tablet by mouth daily.       . sucralfate (CARAFATE) 1 G tablet Take 1 tablet (1 g total) by mouth 4 (four) times daily.  120 tablet  5  . valACYclovir (VALTREX) 500 MG tablet Take 1 tablet (500 mg total) by mouth 2 (two) times daily.  60 tablet  2   No current facility-administered medications for this visit.    SURGICAL HISTORY:  Past Surgical History  Procedure Laterality Date  . Cesarean section      x 3  . Tubal ligation    . Tonsillectomy and adenoidectomy    . Cholecystectomy  2001    lap choli  . Breast lumpectomy with sentinel lymph node biopsy  12/31/2012    Procedure: BREAST LUMPECTOMY WITH SENTINEL LYMPH NODE BX;  Surgeon: Almond Lint, MD;  Location: [facility];  Service: General;  Laterality: Right;  . Portacath placement  12/31/2012    Procedure: INSERTION PORT-A-CATH;  Surgeon: Almond Lint, MD;  Location: [facility];  Service: General;  Laterality: Left;  Left Subclavian Vein  . Re-excision of breast cancer,superior margins  01/14/2013    Procedure: RE-EXCISION OF BREAST CANCER,SUPERIOR MARGINS;  Surgeon: Almond Lint, MD;  Location: [facility];  Service: General;  Laterality: Right;  right breast re excision for markers     REVIEW OF SYSTEMS:   General: fatigue (+), night sweats (-), fever (-), pain (-) Lymph: palpable nodes (-) HEENT: vision changes (-), mucositis (+), gum bleeding (-), epistaxis (-) Cardiovascular: chest pain (-), palpitations (-) Pulmonary: shortness of breath (-), dyspnea on exertion (-), cough (-), hemoptysis (-) GI:  Early satiety (-), melena (-), dysphagia (-), nausea/vomiting (-), diarrhea (+) GU: dysuria (-), hematuria (-), incontinence (-) Musculoskeletal: joint swelling (-), joint pain (-), back pain (-) Neuro: weakness (-), numbness (+), headache (-), confusion (-) Skin: Rash (-), lesions (-), dryness (-) Psych: depression (-),  suicidal/homicidal ideation (-), feeling of hopelessness (-)  PHYSICAL EXAMINATION: Blood pressure 131/79, pulse 91, temperature 98.6 F (37 C), temperature source Oral, resp. rate 20, height 5\' 6"  (1.676 m), weight 237 lb 12.8 oz (107.865 kg), last menstrual period 01/07/2009. Body mass index is 38.4 kg/(m^2). General: Patient is a well appearing female in no acute distress HEENT: PERRLA, sclerae anicteric no conjunctival pallor, MMM, erythema and ulcerations on tongue, no white patches Neck: supple, no palpable adenopathy Lungs: clear to auscultation bilaterally, no wheezes, rhonchi, or rales Cardiovascular: regular rate rhythm, S1, S2, no murmurs, rubs or gallops Abdomen: Soft, non-tender, non-distended, normoactive bowel sounds, no HSM Extremities: warm and well perfused, no clubbing, cyanosis, or edema, left ankle swelling with ace wrap on.  Skin: No rashes or lesions Neuro: Non-focal ECOG PERFORMANCE STATUS: 0 - Asymptomatic Right breast lumpectomy scar healed, no nodularity, left breast, no masses or nipple discharge   LABORATORY DATA: Lab Results  Component  Value Date   WBC 7.1 04/27/2013   HGB 8.9* 04/27/2013   HCT 28.8* 04/27/2013   MCV 88.3 04/27/2013   PLT 232 04/27/2013      Chemistry      Component Value Date/Time   NA 139 04/20/2013 0819   NA 135 02/24/2013 1857   K 4.2 04/20/2013 0819   K 3.4* 02/24/2013 1857   CL 105 04/20/2013 0819   CL 97 02/24/2013 1857   CO2 23 04/20/2013 0819   CO2 29 02/24/2013 1857   BUN 12.4 04/20/2013 0819   BUN 6 02/24/2013 1857   CREATININE 0.6 04/20/2013 0819   CREATININE 0.55 02/24/2013 1857      Component Value Date/Time   CALCIUM 9.8 04/20/2013 0819   CALCIUM 9.2 02/24/2013 1857   ALKPHOS 114 04/20/2013 0819   ALKPHOS 113 02/24/2013 1857   AST 22 04/20/2013 0819   AST 18 02/24/2013 1857   ALT 47 04/20/2013 0819   ALT 30 02/24/2013 1857   BILITOT 0.46 04/20/2013 0819   BILITOT 0.2* 02/24/2013 1857    ADDITIONAL INFORMATION: 1. PROGNOSTIC  INDICATORS - ACIS Results IMMUNOHISTOCHEMICAL AND MORPHOMETRIC ANALYSIS BY THE AUTOMATED CELLULAR IMAGING SYSTEM (ACIS) Estrogen Receptor (Negative, <1%): 0%, NEGATIVE Progesterone Receptor (Negative, <1%): 0%, NEGATIVE COMMENT: The negative hormone receptor study(ies) in this case have an internal positive control. All controls stained appropriately Pecola Leisure MD Pathologist, Electronic Signature ( Signed 01/07/2013) 1. CHROMOGENIC IN-SITU HYBRIDIZATION Interpretation: HER2/NEU BY CISH - SHOWS AMPLIFICATION BY CISH ANALYSIS. THE RATIO OF HER2: CEP 17 SIGNALS WAS 4.36. Reference range: Ratio: HER2:CEP17 < 1.8 gene amplification not observed Ratio: HER2:CEP 17 1.8-2.2 - equivocal result Ratio: HER2:CEP17 > 2.2 - gene amplification observed 1 of 4 FINAL for DARBY, FLEEMAN ([id number]) ADDITIONAL INFORMATION:(continued) Pecola Leisure MD Pathologist, Electronic Signature ( Signed 01/07/2013) FINAL DIAGNOSIS Diagnosis 1. Breast, lumpectomy, Right - INVASIVE DUCTAL CARCINOMA, GRADE III (1.8 CM), SEE COMMENT. - LYMPHOVASCULAR INVASION IDENTIFIED. - INVASIVE TUMOR IS 1 MM TO NEAREST MARGIN (POSTERIOR). - DUCTAL CARCINOMA IN SITU, GRADE III WITH COMEDO NECROSIS. - IN SITU CARCINOMA IS LESS THAN 0.1 MM FROM ANTERIOR-INFERIOR AND POSTERIOR MARGINS. - SEE TUMOR TEMPLATE BELOW. 2. Lymph node, sentinel, biopsy, Right axillary - ONE LYMPH NODE, NEGATIVE FOR TUMOR (0/1), SEE COMMENT. 3. Lymph node, sentinel, biopsy, Right axillary - ONE LYMPH NODE, NEGATIVE FOR TUMOR (0/1), SEE COMMENT. Microscopic Comment 1. BREAST, INVASIVE TUMOR, WITH LYMPH NODE SAMPLING Specimen, including laterality: Right breast Procedure: Lumpectomy Grade: III of III Tubule formation: II Nuclear pleomorphism: III Mitotic:III Tumor size (gross measurement): 1.8 cm Margins: Invasive, distance to closest margin: 0.6 cm In-situ, distance to closest margin: less than 0.1 cm If margin positive, focally or broadly:  N/A Lymphovascular invasion: Present Ductal carcinoma in situ: Present Grade: III of III Extensive intraductal component: Absent Lobular neoplasia: Absent Tumor focality: Unifocal Treatment effect: None If present, treatment effect in breast tissue, lymph nodes or both: N/A Extent of tumor: Skin: N/A Nipple: N/A Skeletal muscle: N/A Lymph nodes: # examined: 2 Lymph nodes with metastasis: 0 Breast prognostic profile: Estrogen receptor: Repeated, previous study demonstrated 0% positivity ([id number]) 2 of 4 FINAL for CADIENCE, BRADFIELD ([id number]) Microscopic Comment(continued) Progesterone receptor: Repeated, previous study demonstrated 0% positivity ([id number]) Her 2 neu: Repeated, previous study demonstrated amplification (3.88) ([id number]) Ki-67: Not repeated, previous study demonstrated 83% proliferation rate ([id number]) Non-neoplastic breast: Previous biopsy site, fibrocystic change and microcalcifications TNM: pT1c pN0 pMX   RADIOGRAPHIC STUDIES:  Mr Breast Bilateral W Wo Contrast  12/23/2012  *  RADIOLOGY REPORT*  Clinical Data: recent diagnosis of invasive ductal carcinoma 12 o'clock right breast, for treatment planning  BILATERAL BREAST MRI WITH AND WITHOUT CONTRAST  Technique: Multiplanar, multisequence MR images of both breasts were obtained prior to and following the intravenous administration of 20ml of multihance.  Three dimensional images were evaluated at the independent DynaCad workstation.  Comparison:  mammographic and ultrasound images 12/08/12 and 11/20/12  Findings: There is mild background parenchymal enhancement.  The left breast is negative. There is no evidence of axillary or internal mammary adenopathy.  There is no abnormal skin or nipple enhancement.  On the right, associated with the recently placed biopsy marker clip, there is an enhancing spiculated mass in the 12 o'clock position 11cm from the nipple.  The mass measures 23 x 12mm transverse dimension  by 35mm craniocaudal dimension.  1cm anterior and slightly inferior to the mass is a second 7mm mass which is connected to the dominant mass by a thin bridge of enhancement. There are no other abnormalities in the right breast.  IMPRESSION: Biopsy-proven invasive carcinoma in the 12 o'clock position of the right breast, with an additional satellite focus as described above.  BI-RADS CATEGORY 6:  Known biopsy-proven malignancy - appropriate action should be taken.  THREE-DIMENSIONAL MR IMAGE RENDERING ON INDEPENDENT WORKSTATION:  Three-dimensional MR images were rendered by post-processing of the original MR data on an independent workstation.  The three- dimensional MR images were interpreted, and findings were reported in the accompanying complete MRI report for this study.   Original Report Authenticated By: Esperanza Heir, M.D.    Nm Sentinel Node Inj-no Rpt (breast)  12/31/2012  CLINICAL DATA: right breast cancer   Sulfur colloid was injected intradermally by the nuclear medicine  technologist for breast cancer sentinel node localization.     Dg Chest Port 1 View  12/31/2012  *RADIOLOGY REPORT*  Clinical Data: Port-A-Cath placement.  History of breast cancer.  PORTABLE CHEST - 1 VIEW  Comparison: 03/26/2011.  Findings: Central line has been placed from the left.  The tip is at the level of the distal superior vena cava/caval atrial junction.  No gross pneumothorax.  Consolidation medial aspect of the lung bases may be present possibly representing atelectasis or infiltrate.  Follow-up two- view chest with better inspiration recommended for further delineation.  Heart size top normal.  Mild central pulmonary vascular prominence.  Surgical clips project over the right lower chest and may be related to breast reconstruction/axillary lymph node dissection.  IMPRESSION: Central line has been placed from the left.  The tip is at the level of the distal superior vena cava/caval atrial junction.  No gross  pneumothorax.  Consolidation medial aspect of the lung bases may be present possibly representing atelectasis or infiltrate.  Follow-up two- view chest with better inspiration recommended for further delineation.   Original Report Authenticated By: Lacy Duverney, M.D.    Dg Fluoro Guide Cv Line-no Report  12/31/2012  CLINICAL DATA: portacath   FLOURO GUIDE CV LINE  Fluoroscopy was utilized by the requesting physician.  No radiographic  interpretation.      ASSESSMENT: 52 year old female with  #1  diagnosis of invasive ductal carcinoma of the right breast she is now status post lumpectomy with sentinel lymph node biopsy with the final pathology revealing a 1.8 cm invasive ductal carcinoma grade 3 ER negative PR negative HER-2/neu amplified at 4.36. 2 sentinel nodes were negative for metastatic disease. There was noted to be lymphovascular invasion. Postoperatively patient is doing well.  She will go for a reexcision for positive margin.  #2 subsequently begun on adjuvant chemotherapy consisting of Taxotere carboplatinum and Herceptin starting 01/26/2013 - 04/20/13. She only received 4 cycles of Taxotere and carboplatinum but she will continue Herceptin to finish out a full year.  #3 reflux which she has a chronic history. But it is worsened since starting her chemotherapy.  #4 rash on her scapula.  #5 neuropathy  Patient will continue Neurontin 3 times a day. I will also add Cymbalta 20 mg daily.  PLAN:  #1 patient will proceed with Herceptin only today.  #2 I have referred her to Dr. Chipper Herb for radiation therapy.  #3 she will return in one week's time for followup  All questions were answered. The patient knows to call the clinic with any problems, questions or concerns. We can certainly see the patient much sooner if necessary.  I spent 25 minutes counseling the patient face to face. The total time spent in the appointment was 30 minutes.

## 2013-04-27 NOTE — Patient Instructions (Addendum)
Proceed with herceptin only  cymbalta for aches and pains  Continue same dose of neurontin  We will see you back in 1 week

## 2013-04-27 NOTE — Patient Instructions (Addendum)
[facility] Discharge Instructions for Patients Receiving Chemotherapy  Today you received the following chemotherapy agents Herceptin.  To help prevent nausea and vomiting after your treatment, we encourage you to take your nausea medication as prescribed.   If you develop nausea and vomiting that is not controlled by your nausea medication, call the clinic. If it is after clinic hours your family physician or the after hours number for the clinic or go to the Emergency Department.   BELOW ARE SYMPTOMS THAT SHOULD BE REPORTED IMMEDIATELY:  *FEVER GREATER THAN 100.5 F  *CHILLS WITH OR WITHOUT FEVER  NAUSEA AND VOMITING THAT IS NOT CONTROLLED WITH YOUR NAUSEA MEDICATION  *UNUSUAL SHORTNESS OF BREATH  *UNUSUAL BRUISING OR BLEEDING  TENDERNESS IN MOUTH AND THROAT WITH OR WITHOUT PRESENCE OF ULCERS  *URINARY PROBLEMS  *BOWEL PROBLEMS  UNUSUAL RASH Items with * indicate a potential emergency and should be followed up as soon as possible.  Feel free to call the clinic you have any questions or concerns. The clinic phone number is [phone].   I have been informed and understand all the instructions given to me. I know to contact the clinic, my physician, or go to the Emergency Department if any problems should occur. I do not have any questions at this time, but understand that I may call the clinic during office hours   should I have any questions or need assistance in obtaining follow up care.    __________________________________________  _____________  __________ Signature of Patient or Authorized Representative            Date                   Time    __________________________________________ Nurse's Signature    

## 2013-04-29 ENCOUNTER — Encounter: Payer: Self-pay | Admitting: Radiation Oncology

## 2013-04-29 DIAGNOSIS — C50919 Malignant neoplasm of unspecified site of unspecified female breast: Secondary | ICD-10-CM | POA: Insufficient documentation

## 2013-04-29 NOTE — Progress Notes (Signed)
Location of Breast Cancer: Right Breast  Histology per Pathology Report:Invasive ductal carcinoma, grade lll, No residual tumor  Receptor Status: ER(-), PR (-), Her2-neu (+)  Patient had no symptoms, mass seen by right breat diagnostic mammogram on 12/08/12. Ultrasound core biopsy (pathology) grade 2 invasive ductal carcinoma.  Past/Anticipated interventions by surgeon, if any: Right lumpectomy on 12/31/2012 revealed 8 cm grade 3 invasive ductal ca. With positive margins.Right breast re-excision 01/14/13 revealed no residual tumor.  Past/Anticipated interventions by medical oncology, if any: Adjuvant chemotherapy of taxotere and carboplatin.discontinued secondary to neuropathy.Herceptin to be weekly. Completed 4 of 6 planned cycles, last one on 03/30/13 MRI of brain 02/24/13: No acute or metastatic intracranial abnormality. CT of head:02/24/13 Normal MR breast bilateral on 12/22/12: carcinoma of 12 o'clock.  Lymphedema issues, if any:  no  Pain issues, if any:  Feet- neuropathy  SAFETY ISSUES:  Prior radiation? no  Pacemaker/ICD? no  Possible current pregnancy? no  Is the patient on methotrexate? no  Current Complaints / other details:  Married, 3 daughters, stay at home mother

## 2013-04-30 ENCOUNTER — Ambulatory Visit
Admission: RE | Admit: 2013-04-30 | Discharge: 2013-04-30 | Disposition: A | Discharge status: Home / Self Care | Payer: BC Managed Care – PPO | Source: Ambulatory Visit | Attending: Radiation Oncology | Admitting: Radiation Oncology

## 2013-04-30 ENCOUNTER — Encounter: Payer: Self-pay | Admitting: Radiation Oncology

## 2013-04-30 VITALS — BP 129/92 | HR 84 | Temp 98.6°F | Resp 20 | Ht 66.0 in | Wt 234.7 lb

## 2013-04-30 DIAGNOSIS — Z171 Estrogen receptor negative status [ER-]: Secondary | ICD-10-CM | POA: Insufficient documentation

## 2013-04-30 DIAGNOSIS — Z9221 Personal history of antineoplastic chemotherapy: Secondary | ICD-10-CM | POA: Insufficient documentation

## 2013-04-30 DIAGNOSIS — C50919 Malignant neoplasm of unspecified site of unspecified female breast: Secondary | ICD-10-CM | POA: Insufficient documentation

## 2013-04-30 DIAGNOSIS — C50411 Malignant neoplasm of upper-outer quadrant of right female breast: Secondary | ICD-10-CM

## 2013-04-30 NOTE — Addendum Note (Signed)
Encounter addended by: Glennie Hawk, RN on: 04/30/2013 10:33 AM<BR>     Documentation filed: Charges VN

## 2013-04-30 NOTE — Progress Notes (Signed)
CC: Dr. Drue Second, Dr. Almond Lint, Dr. 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 She was seen at the [facility] by Dr. Michell Heinrich on 12/17/2012. She is a family friend, and has asked to see me for her radiation therapy. She presented with a 1.8 cm invasive ductal carcinoma along with DCIS for which she underwent a right lumpectomy and sentinel lymph node biopsy on 12/31/2012. She had  invasive ductal carcinoma which was high grade, ER/PR negative and HER-2/neu positive with an elevated Ki-67 of 80%. 2 sentinel lymph nodes were negative. She had DCIS within 0.1 cm of the posterior surgical margin, and therefore she underwent reexcision on 01/14/2013. There was no residual invasive or noninvasive disease. Her staging workup was negative. She will onto receive [facility] chemotherapy with Dr. Welton Flakes was only able to tolerate 4 of a planned 6 cycles secondary to neuropathy. Last chemotherapy was approximately 1 month ago. She was placed on Cymbalta and Neurontin and she may be somewhat improved. Otherwise, she is without complaints today.  His examination: Alert and oriented. Filed Vitals:   04/30/13 0737  BP: 129/92  Pulse: 84  Temp: 98.6 F (37 C)  Resp: 20   Head and neck examination: She wears a wig. Nodes: Without palpable cervical, supraclavicular, or axillary lymphadenopathy. Chest: Lungs clear. Heart: Regular rate rhythm. Breasts: She is large breasted. There is a partial mastectomy scar along the superior aspect of her right breast extending from 11 to 1:00. No masses are appreciated. Left breast without masses or lesions. Abdomen without hepatomegaly. Extremities without edema.  Lab data: Lab Results  Component  Value Date   WBC 7.1 04/27/2013   HGB 8.9* 04/27/2013   HCT 28.8* 04/27/2013   MCV 88.3 04/27/2013   PLT 232 04/27/2013   Impression: Stage I (T1 C. N0 M0) high-grade invasive ductal/DCIS of the right breast which is ER/PR negative and HER-2/neu positive. She is a candidate for breast preservation. She will need standard fractionation based on her breast size. There is  some concern about hypofractionated radiation therapy in patients with poorly differentiated tumors which, which on subset analysis, shows a higher recurrence rate with hypo-fractionated radiation therapy ([place] trial). I anticipate delivering 5000 cGy in 5 weeks to the right breast (no boost). She'll continue with Herceptin. We discussed the potential acute and late toxicities of radiation therapy. Consent is signed today.  Plan: She'll return on May 27 for simulation/treatment planning. She'll begin her radiation therapy the week of June 2. Of note is that her daughter is getting married on June 14.  30 minutes was spent face-to-face with the patient, primarily counseling the patient and coordinating her care.

## 2013-04-30 NOTE — Progress Notes (Signed)
Please see the Nurse Progress Note in the MD Initial Consult Encounter for this patient. 

## 2013-05-04 ENCOUNTER — Other Ambulatory Visit: Payer: BC Managed Care – PPO | Admitting: Lab

## 2013-05-04 ENCOUNTER — Ambulatory Visit: Payer: BC Managed Care – PPO

## 2013-05-05 ENCOUNTER — Ambulatory Visit: Payer: BC Managed Care – PPO

## 2013-05-05 ENCOUNTER — Ambulatory Visit
Admission: RE | Admit: 2013-05-05 | Discharge: 2013-05-05 | Disposition: A | Discharge status: Home / Self Care | Payer: BC Managed Care – PPO | Source: Ambulatory Visit | Attending: Radiation Oncology | Admitting: Radiation Oncology

## 2013-05-05 ENCOUNTER — Ambulatory Visit: Payer: BC Managed Care – PPO | Admitting: Adult Health

## 2013-05-05 ENCOUNTER — Other Ambulatory Visit: Payer: BC Managed Care – PPO | Admitting: Lab

## 2013-05-05 DIAGNOSIS — C50411 Malignant neoplasm of upper-outer quadrant of right female breast: Secondary | ICD-10-CM

## 2013-05-05 DIAGNOSIS — L589 Radiodermatitis, unspecified: Secondary | ICD-10-CM | POA: Insufficient documentation

## 2013-05-05 DIAGNOSIS — Y842 Radiological procedure and radiotherapy as the cause of abnormal reaction of the patient, or of later complication, without mention of misadventure at the time of the procedure: Secondary | ICD-10-CM | POA: Insufficient documentation

## 2013-05-05 DIAGNOSIS — C50919 Malignant neoplasm of unspecified site of unspecified female breast: Secondary | ICD-10-CM | POA: Insufficient documentation

## 2013-05-05 NOTE — Progress Notes (Signed)
Complex simulation/treatment planning note: The patient was taken to the CT simulator. She was placed on a custom breast board with a custom neck mold constructed for immobilization. Her right breast field borders were marked with a radiopaque wire. Her upper partial mastectomy scar was marked as well. She was then scanned. I contoured her right breast lumpectomy tumor bed, and dosimetry contoured the remainder of her normal structures. She was set up to medial and lateral right breast tangents. 2 sets of multileaf collimators were designed to conform the field for a total of 3 complex treatment devices. I prescribing 5000 cGy 25 sessions . There is no boost.

## 2013-05-06 ENCOUNTER — Encounter: Payer: Self-pay | Admitting: Adult Health

## 2013-05-06 ENCOUNTER — Ambulatory Visit (HOSPITAL_BASED_OUTPATIENT_CLINIC_OR_DEPARTMENT_OTHER): Payer: BC Managed Care – PPO

## 2013-05-06 ENCOUNTER — Ambulatory Visit (HOSPITAL_BASED_OUTPATIENT_CLINIC_OR_DEPARTMENT_OTHER): Payer: BC Managed Care – PPO | Admitting: Adult Health

## 2013-05-06 ENCOUNTER — Telehealth: Payer: Self-pay | Admitting: Oncology

## 2013-05-06 ENCOUNTER — Other Ambulatory Visit (HOSPITAL_BASED_OUTPATIENT_CLINIC_OR_DEPARTMENT_OTHER): Payer: BC Managed Care – PPO | Admitting: Lab

## 2013-05-06 ENCOUNTER — Encounter: Payer: Self-pay | Admitting: Oncology

## 2013-05-06 ENCOUNTER — Telehealth: Payer: Self-pay | Admitting: *Deleted

## 2013-05-06 VITALS — BP 135/88 | HR 68 | Temp 98.6°F | Resp 20 | Ht 66.0 in | Wt 232.7 lb

## 2013-05-06 DIAGNOSIS — C50411 Malignant neoplasm of upper-outer quadrant of right female breast: Secondary | ICD-10-CM

## 2013-05-06 DIAGNOSIS — C50919 Malignant neoplasm of unspecified site of unspecified female breast: Secondary | ICD-10-CM

## 2013-05-06 DIAGNOSIS — C50419 Malignant neoplasm of upper-outer quadrant of unspecified female breast: Secondary | ICD-10-CM

## 2013-05-06 DIAGNOSIS — Z171 Estrogen receptor negative status [ER-]: Secondary | ICD-10-CM

## 2013-05-06 DIAGNOSIS — K219 Gastro-esophageal reflux disease without esophagitis: Secondary | ICD-10-CM

## 2013-05-06 DIAGNOSIS — G589 Mononeuropathy, unspecified: Secondary | ICD-10-CM

## 2013-05-06 DIAGNOSIS — Z5112 Encounter for antineoplastic immunotherapy: Secondary | ICD-10-CM

## 2013-05-06 LAB — CBC WITH DIFFERENTIAL/PLATELET
BASO%: 0.8 % (ref 0.0–2.0)
EOS%: 2.6 % (ref 0.0–7.0)
Eosinophils Absolute: 0.2 10*3/uL (ref 0.0–0.5)
LYMPH%: 36.1 % (ref 14.0–49.7)
MCH: 27.2 pg (ref 25.1–34.0)
MCHC: 31.1 g/dL — ABNORMAL LOW (ref 31.5–36.0)
MCV: 87.5 fL (ref 79.5–101.0)
MONO%: 6.7 % (ref 0.0–14.0)
Platelets: 164 10*3/uL (ref 145–400)
RBC: 3.6 10*6/uL — ABNORMAL LOW (ref 3.70–5.45)
RDW: 15.3 % — ABNORMAL HIGH (ref 11.2–14.5)
nRBC: 0 % (ref 0–0)

## 2013-05-06 LAB — COMPREHENSIVE METABOLIC PANEL (CC13)
ALT: 23 U/L (ref 0–55)
Albumin: 3.8 g/dL (ref 3.5–5.0)
CO2: 25 mEq/L (ref 22–29)
Chloride: 107 mEq/L (ref 98–107)
Potassium: 3.9 mEq/L (ref 3.5–5.1)
Sodium: 141 mEq/L (ref 136–145)
Total Bilirubin: 0.5 mg/dL (ref 0.20–1.20)
Total Protein: 6.8 g/dL (ref 6.4–8.3)

## 2013-05-06 MED ORDER — HEPARIN SOD (PORK) LOCK FLUSH 100 UNIT/ML IV SOLN
500.0000 [IU] | Freq: Once | INTRAVENOUS | Status: AC | PRN
  Administered 2013-05-06: 500 [IU]
  Filled 2013-05-06: qty 5

## 2013-05-06 MED ORDER — ACETAMINOPHEN 325 MG PO TABS
650.0000 mg | ORAL_TABLET | Freq: Once | ORAL | Status: AC
  Administered 2013-05-06: 650 mg via ORAL

## 2013-05-06 MED ORDER — SODIUM CHLORIDE 0.9 % IV SOLN
Freq: Once | INTRAVENOUS | Status: AC
  Administered 2013-05-06: 10:00:00 via INTRAVENOUS

## 2013-05-06 MED ORDER — SODIUM CHLORIDE 0.9 % IJ SOLN
10.0000 mL | INTRAMUSCULAR | Status: DC | PRN
  Administered 2013-05-06: 10 mL
  Filled 2013-05-06: qty 10

## 2013-05-06 MED ORDER — DIPHENHYDRAMINE HCL 25 MG PO CAPS: 50.0000 mg | ORAL_CAPSULE | Freq: Once | ORAL | Status: DC

## 2013-05-06 MED ORDER — TRASTUZUMAB CHEMO INJECTION 440 MG
2.0000 mg/kg | Freq: Once | INTRAVENOUS | Status: AC
  Administered 2013-05-06: 210 mg via INTRAVENOUS
  Filled 2013-05-06: qty 10

## 2013-05-06 NOTE — Telephone Encounter (Signed)
Per staff phone call and POF I have schedueld appts.  JMW  

## 2013-05-06 NOTE — Patient Instructions (Signed)

## 2013-05-06 NOTE — Patient Instructions (Signed)
Doing well.  Proceed with Herceptin.  Take Neurontin 200mg  three times per day.  Please call us if you have any questions or concerns.

## 2013-05-06 NOTE — Progress Notes (Signed)
OFFICE PROGRESS NOTE  CC  DOOLITTLE, Harrel Lemon, MD [address] [place] [address] Dr. Almond Lint Dr. Lurline Hare  DIAGNOSIS: 52 year old female with invasive ductal carcinoma of the right breast diagnosed December 2013.  PRIOR THERAPY:  #1 patient was originally seen in the multidisciplinary breast clinic on 12/17/2012 with a screening mammogram that showed an abnormality in the right breast. This abnormality measured 1.6 cm. She had a biopsy performed that showed invasive ductal carcinoma grade 2, ER negative, PR negative, HER-2 positive with a Ki-67 of 80%.  #2 patient is now status post right lumpectomy on 12/31/2012 with the pathology revealing a 8 cm invasive ductal carcinoma grade 3 with lymphovascular invasion, ER negative PR negative HER-2/neu positive with amplification of 4.36, 2 sentinel nodes were negative for metastatic disease. Patient did have positive margins and she will need a reexcision which will be performed on 01/14/2013. Patient has a Port-A-Cath in place.  #3 patient will proceed with adjuvant chemotherapy initially consisting of Taxotere carboplatinum and Herceptin. With Taxotere carboplatinum to be given every 21 days and Herceptin on a weekly basis for a total of 6 cycles of TC.we will plan on starting her first cycle of chemotherapy on 01/26/2013 - 04/20/13. She stopped Taxotere carboplatin after 4 cycles, it was discontinued due to worsening neuropathy.    CURRENT THERAPY: Herceptin weekly  INTERVAL HISTORY: Julia Zuniga 52 y.o. female returns for followup today. She is feeling well today.  She started Cymbalta last week, and feels like it has improved slightly since starting it.  She does feel a difference in her joints and muscles regarding balance in her legs.  She continues to have numbness.  Otherwise she denies fevers, chills, nausea, vomiting, constipation, pain, palpitations, shortness of breath, and swelling.    MEDICAL HISTORY: Past  Medical History  Diagnosis Date  . Hyperlipidemia   . Diverticulitis   . Hernia, hiatal   . Heartburn   . Anxiety   . Hot flashes   . PONV (postoperative nausea and vomiting)   . Breast cancer 12/08/12    Right    ALLERGIES:  is allergic to chlorhexidine; codeine; medralone; and oxycodone-acetaminophen.  MEDICATIONS:  Current Outpatient Prescriptions  Medication Sig Dispense Refill  . atorvastatin (LIPITOR) 40 MG tablet Take 40 mg by mouth every evening.      . B Complex-C (SUPER B COMPLEX PO) Take 1 tablet by mouth daily.      . carvedilol (COREG) 6.25 MG tablet Take 1 tablet (6.25 mg total) by mouth 2 (two) times daily with a meal.  60 tablet  6  . dexamethasone (DECADRON) 4 MG tablet Take 2 tablets (8 mg total) by mouth 2 (two) times daily with a meal. Take two times a day the day before Taxotere. Then take two times a day starting the day after chemo for 3 days.  30 tablet  1  . DULoxetine (CYMBALTA) 20 MG capsule Take 1 capsule (20 mg total) by mouth daily.  30 capsule  4  . gabapentin (NEURONTIN) 100 MG capsule Take 2 capsules three times a day  180 capsule  6  . HYDROcodone-acetaminophen (NORCO) 5-325 MG per tablet Take 1-2 tablets by mouth every 6 (six) hours as needed for pain.  30 tablet  1  . ibuprofen (ADVIL,MOTRIN) 200 MG tablet Take 400 mg by mouth every 6 (six) hours as needed. Pain      . omeprazole (PRILOSEC) 40 MG capsule Take 1 capsule (40 mg total) by mouth  daily.  90 capsule  2  . ondansetron (ZOFRAN) 8 MG tablet Take 1 tablet (8 mg total) by mouth 2 (two) times daily. Take two times a day starting the day after chemo for 3 days. Then take two times a day as needed for nausea or vomiting.  30 tablet  1  . Probiotic Product (PROBIOTIC DAILY PO) Take 1 tablet by mouth daily.        No current facility-administered medications for this visit.    SURGICAL HISTORY:  Past Surgical History  Procedure Laterality Date  . Cesarean section      x 3  . Tubal ligation     . Tonsillectomy and adenoidectomy    . Cholecystectomy  2001    lap choli  . Breast lumpectomy with sentinel lymph node biopsy  12/31/2012    Procedure: BREAST LUMPECTOMY WITH SENTINEL LYMPH NODE BX;  Surgeon: Almond Lint, MD;  Location: [facility];  Service: General;  Laterality: Right;  . Portacath placement  12/31/2012    Procedure: INSERTION PORT-A-CATH;  Surgeon: Almond Lint, MD;  Location: [facility];  Service: General;  Laterality: Left;  Left Subclavian Vein  . Re-excision of breast cancer,superior margins  01/14/2013    Procedure: RE-EXCISION OF BREAST CANCER,SUPERIOR MARGINS;  Surgeon: Almond Lint, MD;  Location: [facility];  Service: General;  Laterality: Right;  right breast re excision for markers     REVIEW OF SYSTEMS:   General: fatigue (+), night sweats (-), fever (-), pain (-) Lymph: palpable nodes (-) HEENT: vision changes (-), mucositis (+), gum bleeding (-), epistaxis (-) Cardiovascular: chest pain (-), palpitations (-) Pulmonary: shortness of breath (-), dyspnea on exertion (-), cough (-), hemoptysis (-) GI:  Early satiety (-), melena (-), dysphagia (-), nausea/vomiting (-), diarrhea (+) GU: dysuria (-), hematuria (-), incontinence (-) Musculoskeletal: joint swelling (-), joint pain (-), back pain (-) Neuro: weakness (-), numbness (+), headache (-), confusion (-) Skin: Rash (-), lesions (-), dryness (-) Psych: depression (-), suicidal/homicidal ideation (-), feeling of hopelessness (-)  PHYSICAL EXAMINATION: Blood pressure 135/88, pulse 68, temperature 98.6 F (37 C), temperature source Oral, resp. rate 20, height 5\' 6"  (1.676 m), weight 232 lb 11.2 oz (105.552 kg), last menstrual period 01/07/2009. Body mass index is 37.58 kg/(m^2). General: Patient is a well appearing female in no acute distress HEENT: PERRLA, sclerae anicteric no conjunctival pallor, MMM,  Neck: supple, no palpable adenopathy Lungs: clear to auscultation bilaterally, no  wheezes, rhonchi, or rales Cardiovascular: regular rate rhythm, S1, S2, no murmurs, rubs or gallops Abdomen: Soft, non-tender, non-distended, normoactive bowel sounds, no HSM Extremities: warm and well perfused, no clubbing, cyanosis, or edema Skin: No rashes or lesions Neuro: Non-focal ECOG PERFORMANCE STATUS: 0 - Asymptomatic Right breast lumpectomy scar healed, no nodularity, left breast, no masses or nipple discharge   LABORATORY DATA: Lab Results  Component Value Date   WBC 6.1 05/06/2013   HGB 9.8* 05/06/2013   HCT 31.5* 05/06/2013   MCV 87.5 05/06/2013   PLT 164 05/06/2013      Chemistry      Component Value Date/Time   NA 141 04/27/2013 0833   NA 135 02/24/2013 1857   K 4.1 04/27/2013 0833   K 3.4* 02/24/2013 1857   CL 107 04/27/2013 0833   CL 97 02/24/2013 1857   CO2 24 04/27/2013 0833   CO2 29 02/24/2013 1857   BUN 12.5 04/27/2013 0833   BUN 6 02/24/2013 1857   CREATININE 0.6 04/27/2013 2952  CREATININE 0.55 02/24/2013 1857      Component Value Date/Time   CALCIUM 9.2 04/27/2013 0833   CALCIUM 9.2 02/24/2013 1857   ALKPHOS 109 04/27/2013 0833   ALKPHOS 113 02/24/2013 1857   AST 18 04/27/2013 0833   AST 18 02/24/2013 1857   ALT 32 04/27/2013 0833   ALT 30 02/24/2013 1857   BILITOT 0.52 04/27/2013 0833   BILITOT 0.2* 02/24/2013 1857    ADDITIONAL INFORMATION: 1. PROGNOSTIC INDICATORS - ACIS Results IMMUNOHISTOCHEMICAL AND MORPHOMETRIC ANALYSIS BY THE AUTOMATED CELLULAR IMAGING SYSTEM (ACIS) Estrogen Receptor (Negative, <1%): 0%, NEGATIVE Progesterone Receptor (Negative, <1%): 0%, NEGATIVE COMMENT: The negative hormone receptor study(ies) in this case have an internal positive control. All controls stained appropriately Pecola Leisure MD Pathologist, Electronic Signature ( Signed 01/07/2013) 1. CHROMOGENIC IN-SITU HYBRIDIZATION Interpretation: HER2/NEU BY CISH - SHOWS AMPLIFICATION BY CISH ANALYSIS. THE RATIO OF HER2: CEP 17 SIGNALS WAS 4.36. Reference range: Ratio:  HER2:CEP17 < 1.8 gene amplification not observed Ratio: HER2:CEP 17 1.8-2.2 - equivocal result Ratio: HER2:CEP17 > 2.2 - gene amplification observed 1 of 4 FINAL for Julia, Zuniga ([id number]) ADDITIONAL INFORMATION:(continued) Pecola Leisure MD Pathologist, Electronic Signature ( Signed 01/07/2013) FINAL DIAGNOSIS Diagnosis 1. Breast, lumpectomy, Right - INVASIVE DUCTAL CARCINOMA, GRADE III (1.8 CM), SEE COMMENT. - LYMPHOVASCULAR INVASION IDENTIFIED. - INVASIVE TUMOR IS 1 MM TO NEAREST MARGIN (POSTERIOR). - DUCTAL CARCINOMA IN SITU, GRADE III WITH COMEDO NECROSIS. - IN SITU CARCINOMA IS LESS THAN 0.1 MM FROM ANTERIOR-INFERIOR AND POSTERIOR MARGINS. - SEE TUMOR TEMPLATE BELOW. 2. Lymph node, sentinel, biopsy, Right axillary - ONE LYMPH NODE, NEGATIVE FOR TUMOR (0/1), SEE COMMENT. 3. Lymph node, sentinel, biopsy, Right axillary - ONE LYMPH NODE, NEGATIVE FOR TUMOR (0/1), SEE COMMENT. Microscopic Comment 1. BREAST, INVASIVE TUMOR, WITH LYMPH NODE SAMPLING Specimen, including laterality: Right breast Procedure: Lumpectomy Grade: III of III Tubule formation: II Nuclear pleomorphism: III Mitotic:III Tumor size (gross measurement): 1.8 cm Margins: Invasive, distance to closest margin: 0.6 cm In-situ, distance to closest margin: less than 0.1 cm If margin positive, focally or broadly: N/A Lymphovascular invasion: Present Ductal carcinoma in situ: Present Grade: III of III Extensive intraductal component: Absent Lobular neoplasia: Absent Tumor focality: Unifocal Treatment effect: None If present, treatment effect in breast tissue, lymph nodes or both: N/A Extent of tumor: Skin: N/A Nipple: N/A Skeletal muscle: N/A Lymph nodes: # examined: 2 Lymph nodes with metastasis: 0 Breast prognostic profile: Estrogen receptor: Repeated, previous study demonstrated 0% positivity ([id number]) 2 of 4 FINAL for Julia, Zuniga ([id number]) Microscopic Comment(continued) Progesterone  receptor: Repeated, previous study demonstrated 0% positivity ([id number]) Her 2 neu: Repeated, previous study demonstrated amplification (3.88) ([id number]) Ki-67: Not repeated, previous study demonstrated 83% proliferation rate ([id number]) Non-neoplastic breast: Previous biopsy site, fibrocystic change and microcalcifications TNM: pT1c pN0 pMX   RADIOGRAPHIC STUDIES:  Mr Breast Bilateral W Wo Contrast  12/23/2012  *RADIOLOGY REPORT*  Clinical Data: recent diagnosis of invasive ductal carcinoma 12 o'clock right breast, for treatment planning  BILATERAL BREAST MRI WITH AND WITHOUT CONTRAST  Technique: Multiplanar, multisequence MR images of both breasts were obtained prior to and following the intravenous administration of 20ml of multihance.  Three dimensional images were evaluated at the independent DynaCad workstation.  Comparison:  mammographic and ultrasound images 12/08/12 and 11/20/12  Findings: There is mild background parenchymal enhancement.  The left breast is negative. There is no evidence of axillary or internal mammary adenopathy.  There is no abnormal skin or nipple enhancement.  On the right, associated with the  recently placed biopsy marker clip, there is an enhancing spiculated mass in the 12 o'clock position 11cm from the nipple.  The mass measures 23 x 12mm transverse dimension by 35mm craniocaudal dimension.  1cm anterior and slightly inferior to the mass is a second 7mm mass which is connected to the dominant mass by a thin bridge of enhancement. There are no other abnormalities in the right breast.  IMPRESSION: Biopsy-proven invasive carcinoma in the 12 o'clock position of the right breast, with an additional satellite focus as described above.  BI-RADS CATEGORY 6:  Known biopsy-proven malignancy - appropriate action should be taken.  THREE-DIMENSIONAL MR IMAGE RENDERING ON INDEPENDENT WORKSTATION:  Three-dimensional MR images were rendered by post-processing of the original MR  data on an independent workstation.  The three- dimensional MR images were interpreted, and findings were reported in the accompanying complete MRI report for this study.   Original Report Authenticated By: Esperanza Heir, M.D.    Nm Sentinel Node Inj-no Rpt (breast)  12/31/2012  CLINICAL DATA: right breast cancer   Sulfur colloid was injected intradermally by the nuclear medicine  technologist for breast cancer sentinel node localization.     Dg Chest Port 1 View  12/31/2012  *RADIOLOGY REPORT*  Clinical Data: Port-A-Cath placement.  History of breast cancer.  PORTABLE CHEST - 1 VIEW  Comparison: 03/26/2011.  Findings: Central line has been placed from the left.  The tip is at the level of the distal superior vena cava/caval atrial junction.  No gross pneumothorax.  Consolidation medial aspect of the lung bases may be present possibly representing atelectasis or infiltrate.  Follow-up two- view chest with better inspiration recommended for further delineation.  Heart size top normal.  Mild central pulmonary vascular prominence.  Surgical clips project over the right lower chest and may be related to breast reconstruction/axillary lymph node dissection.  IMPRESSION: Central line has been placed from the left.  The tip is at the level of the distal superior vena cava/caval atrial junction.  No gross pneumothorax.  Consolidation medial aspect of the lung bases may be present possibly representing atelectasis or infiltrate.  Follow-up two- view chest with better inspiration recommended for further delineation.   Original Report Authenticated By: Lacy Duverney, M.D.    Dg Fluoro Guide Cv Line-no Report  12/31/2012  CLINICAL DATA: portacath   FLOURO GUIDE CV LINE  Fluoroscopy was utilized by the requesting physician.  No radiographic  interpretation.      ASSESSMENT: 52 year old female with  #1  diagnosis of invasive ductal carcinoma of the right breast she is now status post lumpectomy with sentinel lymph  node biopsy with the final pathology revealing a 1.8 cm invasive ductal carcinoma grade 3 ER negative PR negative HER-2/neu amplified at 4.36. 2 sentinel nodes were negative for metastatic disease. There was noted to be lymphovascular invasion. Postoperatively patient is doing well. She will go for a reexcision for positive margin.  #2 subsequently begun on adjuvant chemotherapy consisting of Taxotere carboplatinum and Herceptin starting 01/26/2013 - 04/20/13. She only received 4 cycles of Taxotere and carboplatinum but she will continue Herceptin to finish out a full year.  #3 reflux which she has a chronic history.  #4 neuropathy  Patient will continue Neurontin 3 times a day. I will also add Cymbalta 20 mg daily.  PLAN:  #1 patient will proceed with Herceptin only today.  She will decrease Neurontin to 200mg  TID.  Neuropathy improved with Cymbalta.  Her last echo was 4/17, she will be due  again in July.    #2 She will start radiation therapy on June 4 under the care of Dr. Dayton Scrape  #3 she will return in one week's time for followup  All questions were answered. The patient knows to call the clinic with any problems, questions or concerns. We can certainly see the patient much sooner if necessary.  I spent 25 minutes counseling the patient face to face. The total time spent in the appointment was 30 minutes.  Cherie Ouch Lyn Hollingshead, NP Medical Oncology [facility] Phone: [phone]

## 2013-05-08 NOTE — Addendum Note (Signed)
Encounter addended by: Fernado Brigante Marie Jacqeline Broers, RN on: 05/08/2013  3:40 PM<BR>     Documentation filed: Charges VN

## 2013-05-11 ENCOUNTER — Encounter: Payer: Self-pay | Admitting: Adult Health

## 2013-05-11 ENCOUNTER — Ambulatory Visit (HOSPITAL_BASED_OUTPATIENT_CLINIC_OR_DEPARTMENT_OTHER): Payer: BC Managed Care – PPO | Admitting: Adult Health

## 2013-05-11 ENCOUNTER — Ambulatory Visit (HOSPITAL_BASED_OUTPATIENT_CLINIC_OR_DEPARTMENT_OTHER): Payer: BC Managed Care – PPO

## 2013-05-11 ENCOUNTER — Encounter: Payer: Self-pay | Admitting: Oncology

## 2013-05-11 ENCOUNTER — Other Ambulatory Visit: Payer: BC Managed Care – PPO | Admitting: Lab

## 2013-05-11 ENCOUNTER — Other Ambulatory Visit (HOSPITAL_BASED_OUTPATIENT_CLINIC_OR_DEPARTMENT_OTHER): Payer: BC Managed Care – PPO | Admitting: Lab

## 2013-05-11 VITALS — BP 135/86 | HR 76 | Temp 98.8°F | Resp 20 | Ht 66.0 in | Wt 234.3 lb

## 2013-05-11 DIAGNOSIS — C50419 Malignant neoplasm of upper-outer quadrant of unspecified female breast: Secondary | ICD-10-CM

## 2013-05-11 DIAGNOSIS — C50919 Malignant neoplasm of unspecified site of unspecified female breast: Secondary | ICD-10-CM

## 2013-05-11 DIAGNOSIS — C50411 Malignant neoplasm of upper-outer quadrant of right female breast: Secondary | ICD-10-CM

## 2013-05-11 DIAGNOSIS — K219 Gastro-esophageal reflux disease without esophagitis: Secondary | ICD-10-CM

## 2013-05-11 DIAGNOSIS — Z5112 Encounter for antineoplastic immunotherapy: Secondary | ICD-10-CM

## 2013-05-11 DIAGNOSIS — Z171 Estrogen receptor negative status [ER-]: Secondary | ICD-10-CM

## 2013-05-11 DIAGNOSIS — G589 Mononeuropathy, unspecified: Secondary | ICD-10-CM

## 2013-05-11 LAB — COMPREHENSIVE METABOLIC PANEL (CC13)
ALT: 27 U/L (ref 0–55)
Albumin: 3.9 g/dL (ref 3.5–5.0)
Alkaline Phosphatase: 108 U/L (ref 40–150)
Glucose: 92 mg/dl (ref 70–99)
Potassium: 4.3 mEq/L (ref 3.5–5.1)
Sodium: 142 mEq/L (ref 136–145)
Total Protein: 6.7 g/dL (ref 6.4–8.3)

## 2013-05-11 LAB — CBC WITH DIFFERENTIAL/PLATELET
EOS%: 2.9 % (ref 0.0–7.0)
MCHC: 31.8 g/dL (ref 31.5–36.0)
MONO#: 0.4 10*3/uL (ref 0.1–0.9)
NEUT%: 52.3 % (ref 38.4–76.8)
Platelets: 168 10*3/uL (ref 145–400)
RBC: 3.75 10*6/uL (ref 3.70–5.45)
RDW: 14.7 % — ABNORMAL HIGH (ref 11.2–14.5)
WBC: 5.8 10*3/uL (ref 3.9–10.3)

## 2013-05-11 MED ORDER — HEPARIN SOD (PORK) LOCK FLUSH 100 UNIT/ML IV SOLN
500.0000 [IU] | Freq: Once | INTRAVENOUS | Status: AC | PRN
  Administered 2013-05-11: 500 [IU]
  Filled 2013-05-11: qty 5

## 2013-05-11 MED ORDER — ACETAMINOPHEN 325 MG PO TABS
650.0000 mg | ORAL_TABLET | Freq: Once | ORAL | Status: AC
  Administered 2013-05-11: 650 mg via ORAL

## 2013-05-11 MED ORDER — TRASTUZUMAB CHEMO INJECTION 440 MG
6.0000 mg/kg | Freq: Once | INTRAVENOUS | Status: AC
  Administered 2013-05-11: 630 mg via INTRAVENOUS
  Filled 2013-05-11: qty 30

## 2013-05-11 MED ORDER — SODIUM CHLORIDE 0.9 % IJ SOLN
10.0000 mL | INTRAMUSCULAR | Status: DC | PRN
  Administered 2013-05-11: 10 mL
  Filled 2013-05-11: qty 10

## 2013-05-11 MED ORDER — SODIUM CHLORIDE 0.9 % IV SOLN
Freq: Once | INTRAVENOUS | Status: AC
  Administered 2013-05-11: 10:00:00 via INTRAVENOUS

## 2013-05-11 NOTE — Patient Instructions (Addendum)
Doing well.  Proceed with Herceptin.  We will see you back on 6/23.  Please call us if you have any questions or concerns.

## 2013-05-11 NOTE — Patient Instructions (Addendum)
[facility] Discharge Instructions for Patients Receiving Chemotherapy  Today you received the following chemotherapy agents Herceptin.  To help prevent nausea and vomiting after your treatment, we encourage you to take your nausea medication.   If you develop nausea and vomiting that is not controlled by your nausea medication, call the clinic. If it is after clinic hours your family physician or the after hours number for the clinic or go to the Emergency Department.   BELOW ARE SYMPTOMS THAT SHOULD BE REPORTED IMMEDIATELY:  *FEVER GREATER THAN 100.5 F  *CHILLS WITH OR WITHOUT FEVER  NAUSEA AND VOMITING THAT IS NOT CONTROLLED WITH YOUR NAUSEA MEDICATION  *UNUSUAL SHORTNESS OF BREATH  *UNUSUAL BRUISING OR BLEEDING  TENDERNESS IN MOUTH AND THROAT WITH OR WITHOUT PRESENCE OF ULCERS  *URINARY PROBLEMS  *BOWEL PROBLEMS  UNUSUAL RASH Items with * indicate a potential emergency and should be followed up as soon as possible.  One of the nurses will contact you 24 hours after your treatment. Please let the nurse know about any problems that you may have experienced. Feel free to call the clinic you have any questions or concerns. The clinic phone number is [phone].   I have been informed and understand all the instructions given to me. I know to contact the clinic, my physician, or go to the Emergency Department if any problems should occur. I do not have any questions at this time, but understand that I may call the clinic during office hours   should I have any questions or need assistance in obtaining follow up care.    __________________________________________  _____________  __________ Signature of Patient or Authorized Representative            Date                   Time    __________________________________________ Nurse's Signature    

## 2013-05-11 NOTE — Progress Notes (Signed)
OFFICE PROGRESS NOTE  CC  DOOLITTLE, Harrel Lemon, MD [address] [place] [address] Dr. Almond Lint Dr. Lurline Hare  DIAGNOSIS: 52 year old female with invasive ductal carcinoma of the right breast diagnosed December 2013.  PRIOR THERAPY:  #1 patient was originally seen in the multidisciplinary breast clinic on 12/17/2012 with a screening mammogram that showed an abnormality in the right breast. This abnormality measured 1.6 cm. She had a biopsy performed that showed invasive ductal carcinoma grade 2, ER negative, PR negative, HER-2 positive with a Ki-67 of 80%.  #2 patient is now status post right lumpectomy on 12/31/2012 with the pathology revealing a 8 cm invasive ductal carcinoma grade 3 with lymphovascular invasion, ER negative PR negative HER-2/neu positive with amplification of 4.36, 2 sentinel nodes were negative for metastatic disease. Patient did have positive margins and she will need a reexcision which will be performed on 01/14/2013. Patient has a Port-A-Cath in place.  #3 patient will proceed with adjuvant chemotherapy initially consisting of Taxotere carboplatinum and Herceptin. With Taxotere carboplatinum to be given every 21 days and Herceptin on a weekly basis for a total of 6 cycles of TC.we will plan on starting her first cycle of chemotherapy on 01/26/2013 - 04/20/13. She stopped Taxotere carboplatin after 4 cycles, it was discontinued due to worsening neuropathy.    CURRENT THERAPY: Herceptin weekly  INTERVAL HISTORY: Julia Zuniga 52 y.o. female returns for followup today. She is feeling well today.  She continues on Cymbalta daily and is tolerating it well.  She decreased Her Neurontin to 200mg  TID and her numbness has remained stable.  She will start radiation on Wednesday 05/13/13.  She is otherwise feeling well and a 10 point ROS is neg.   MEDICAL HISTORY: Past Medical History  Diagnosis Date  . Hyperlipidemia   . Diverticulitis   . Hernia, hiatal    . Heartburn   . Anxiety   . Hot flashes   . PONV (postoperative nausea and vomiting)   . Breast cancer 12/08/12    Right    ALLERGIES:  is allergic to chlorhexidine; codeine; medralone; and oxycodone-acetaminophen.  MEDICATIONS:  Current Outpatient Prescriptions  Medication Sig Dispense Refill  . atorvastatin (LIPITOR) 40 MG tablet Take 40 mg by mouth every evening.      . B Complex-C (SUPER B COMPLEX PO) Take 1 tablet by mouth daily.      . carvedilol (COREG) 6.25 MG tablet Take 1 tablet (6.25 mg total) by mouth 2 (two) times daily with a meal.  60 tablet  6  . dexamethasone (DECADRON) 4 MG tablet Take 2 tablets (8 mg total) by mouth 2 (two) times daily with a meal. Take two times a day the day before Taxotere. Then take two times a day starting the day after chemo for 3 days.  30 tablet  1  . DULoxetine (CYMBALTA) 20 MG capsule Take 1 capsule (20 mg total) by mouth daily.  30 capsule  4  . gabapentin (NEURONTIN) 100 MG capsule Take 2 capsules three times a day  180 capsule  6  . HYDROcodone-acetaminophen (NORCO) 5-325 MG per tablet Take 1-2 tablets by mouth every 6 (six) hours as needed for pain.  30 tablet  1  . ibuprofen (ADVIL,MOTRIN) 200 MG tablet Take 400 mg by mouth every 6 (six) hours as needed. Pain      . omeprazole (PRILOSEC) 40 MG capsule Take 1 capsule (40 mg total) by mouth daily.  90 capsule  2  . ondansetron (  ZOFRAN) 8 MG tablet Take 1 tablet (8 mg total) by mouth 2 (two) times daily. Take two times a day starting the day after chemo for 3 days. Then take two times a day as needed for nausea or vomiting.  30 tablet  1  . Probiotic Product (PROBIOTIC DAILY PO) Take 1 tablet by mouth daily.        No current facility-administered medications for this visit.    SURGICAL HISTORY:  Past Surgical History  Procedure Laterality Date  . Cesarean section      x 3  . Tubal ligation    . Tonsillectomy and adenoidectomy    . Cholecystectomy  2001    lap choli  . Breast  lumpectomy with sentinel lymph node biopsy  12/31/2012    Procedure: BREAST LUMPECTOMY WITH SENTINEL LYMPH NODE BX;  Surgeon: Almond Lint, MD;  Location: [facility];  Service: General;  Laterality: Right;  . Portacath placement  12/31/2012    Procedure: INSERTION PORT-A-CATH;  Surgeon: Almond Lint, MD;  Location: [facility];  Service: General;  Laterality: Left;  Left Subclavian Vein  . Re-excision of breast cancer,superior margins  01/14/2013    Procedure: RE-EXCISION OF BREAST CANCER,SUPERIOR MARGINS;  Surgeon: Almond Lint, MD;  Location: [facility];  Service: General;  Laterality: Right;  right breast re excision for markers     REVIEW OF SYSTEMS:   General: fatigue (-), night sweats (-), fever (-), pain (-) Lymph: palpable nodes (-) HEENT: vision changes (-), mucositis (-), gum bleeding (-), epistaxis (-) Cardiovascular: chest pain (-), palpitations (-) Pulmonary: shortness of breath (-), dyspnea on exertion (-), cough (-), hemoptysis (-) GI:  Early satiety (-), melena (-), dysphagia (-), nausea/vomiting (-), diarrhea (-) GU: dysuria (-), hematuria (-), incontinence (-) Musculoskeletal: joint swelling (-), joint pain (-), back pain (-) Neuro: weakness (-), numbness (+), headache (-), confusion (-) Skin: Rash (-), lesions (-), dryness (-) Psych: depression (-), suicidal/homicidal ideation (-), feeling of hopelessness (-)  PHYSICAL EXAMINATION: Blood pressure 135/86, pulse 76, temperature 98.8 F (37.1 C), temperature source Oral, resp. rate 20, height 5\' 6"  (1.676 m), weight 234 lb 4.8 oz (106.278 kg), last menstrual period 01/07/2009. Body mass index is 37.84 kg/(m^2). General: Patient is a well appearing female in no acute distress HEENT: PERRLA, sclerae anicteric no conjunctival pallor, MMM,  Neck: supple, no palpable adenopathy Lungs: clear to auscultation bilaterally, no wheezes, rhonchi, or rales Cardiovascular: regular rate rhythm, S1, S2, no murmurs,  rubs or gallops Abdomen: Soft, non-tender, non-distended, normoactive bowel sounds, no HSM Extremities: warm and well perfused, no clubbing, cyanosis, or edema Skin: No rashes or lesions Neuro: Non-focal ECOG PERFORMANCE STATUS: 0 - Asymptomatic Right breast lumpectomy scar healed, no nodularity, left breast, no masses or nipple discharge   LABORATORY DATA: Lab Results  Component Value Date   WBC 5.8 05/11/2013   HGB 10.4* 05/11/2013   HCT 32.7* 05/11/2013   MCV 87.2 05/11/2013   PLT 168 05/11/2013      Chemistry      Component Value Date/Time   NA 141 05/06/2013 0846   NA 135 02/24/2013 1857   K 3.9 05/06/2013 0846   K 3.4* 02/24/2013 1857   CL 107 05/06/2013 0846   CL 97 02/24/2013 1857   CO2 25 05/06/2013 0846   CO2 29 02/24/2013 1857   BUN 9.7 05/06/2013 0846   BUN 6 02/24/2013 1857   CREATININE 0.6 05/06/2013 0846   CREATININE 0.55 02/24/2013 1857  Component Value Date/Time   CALCIUM 9.5 05/06/2013 0846   CALCIUM 9.2 02/24/2013 1857   ALKPHOS 110 05/06/2013 0846   ALKPHOS 113 02/24/2013 1857   AST 16 05/06/2013 0846   AST 18 02/24/2013 1857   ALT 23 05/06/2013 0846   ALT 30 02/24/2013 1857   BILITOT 0.50 05/06/2013 0846   BILITOT 0.2* 02/24/2013 1857    ADDITIONAL INFORMATION: 1. PROGNOSTIC INDICATORS - ACIS Results IMMUNOHISTOCHEMICAL AND MORPHOMETRIC ANALYSIS BY THE AUTOMATED CELLULAR IMAGING SYSTEM (ACIS) Estrogen Receptor (Negative, <1%): 0%, NEGATIVE Progesterone Receptor (Negative, <1%): 0%, NEGATIVE COMMENT: The negative hormone receptor study(ies) in this case have an internal positive control. All controls stained appropriately Pecola Leisure MD Pathologist, Electronic Signature ( Signed 01/07/2013) 1. CHROMOGENIC IN-SITU HYBRIDIZATION Interpretation: HER2/NEU BY CISH - SHOWS AMPLIFICATION BY CISH ANALYSIS. THE RATIO OF HER2: CEP 17 SIGNALS WAS 4.36. Reference range: Ratio: HER2:CEP17 < 1.8 gene amplification not observed Ratio: HER2:CEP 17 1.8-2.2 - equivocal  result Ratio: HER2:CEP17 > 2.2 - gene amplification observed 1 of 4 FINAL for EVANNE, MATSUNAGA ([id number]) ADDITIONAL INFORMATION:(continued) Pecola Leisure MD Pathologist, Electronic Signature ( Signed 01/07/2013) FINAL DIAGNOSIS Diagnosis 1. Breast, lumpectomy, Right - INVASIVE DUCTAL CARCINOMA, GRADE III (1.8 CM), SEE COMMENT. - LYMPHOVASCULAR INVASION IDENTIFIED. - INVASIVE TUMOR IS 1 MM TO NEAREST MARGIN (POSTERIOR). - DUCTAL CARCINOMA IN SITU, GRADE III WITH COMEDO NECROSIS. - IN SITU CARCINOMA IS LESS THAN 0.1 MM FROM ANTERIOR-INFERIOR AND POSTERIOR MARGINS. - SEE TUMOR TEMPLATE BELOW. 2. Lymph node, sentinel, biopsy, Right axillary - ONE LYMPH NODE, NEGATIVE FOR TUMOR (0/1), SEE COMMENT. 3. Lymph node, sentinel, biopsy, Right axillary - ONE LYMPH NODE, NEGATIVE FOR TUMOR (0/1), SEE COMMENT. Microscopic Comment 1. BREAST, INVASIVE TUMOR, WITH LYMPH NODE SAMPLING Specimen, including laterality: Right breast Procedure: Lumpectomy Grade: III of III Tubule formation: II Nuclear pleomorphism: III Mitotic:III Tumor size (gross measurement): 1.8 cm Margins: Invasive, distance to closest margin: 0.6 cm In-situ, distance to closest margin: less than 0.1 cm If margin positive, focally or broadly: N/A Lymphovascular invasion: Present Ductal carcinoma in situ: Present Grade: III of III Extensive intraductal component: Absent Lobular neoplasia: Absent Tumor focality: Unifocal Treatment effect: None If present, treatment effect in breast tissue, lymph nodes or both: N/A Extent of tumor: Skin: N/A Nipple: N/A Skeletal muscle: N/A Lymph nodes: # examined: 2 Lymph nodes with metastasis: 0 Breast prognostic profile: Estrogen receptor: Repeated, previous study demonstrated 0% positivity ([id number]) 2 of 4 FINAL for PHYLLIS, ABELSON ([id number]) Microscopic Comment(continued) Progesterone receptor: Repeated, previous study demonstrated 0% positivity ([id number]) Her 2 neu:  Repeated, previous study demonstrated amplification (3.88) ([id number]) Ki-67: Not repeated, previous study demonstrated 83% proliferation rate ([id number]) Non-neoplastic breast: Previous biopsy site, fibrocystic change and microcalcifications TNM: pT1c pN0 pMX   RADIOGRAPHIC STUDIES:  Mr Breast Bilateral W Wo Contrast  12/23/2012  *RADIOLOGY REPORT*  Clinical Data: recent diagnosis of invasive ductal carcinoma 12 o'clock right breast, for treatment planning  BILATERAL BREAST MRI WITH AND WITHOUT CONTRAST  Technique: Multiplanar, multisequence MR images of both breasts were obtained prior to and following the intravenous administration of 20ml of multihance.  Three dimensional images were evaluated at the independent DynaCad workstation.  Comparison:  mammographic and ultrasound images 12/08/12 and 11/20/12  Findings: There is mild background parenchymal enhancement.  The left breast is negative. There is no evidence of axillary or internal mammary adenopathy.  There is no abnormal skin or nipple enhancement.  On the right, associated with the recently placed biopsy marker clip, there is an enhancing  spiculated mass in the 12 o'clock position 11cm from the nipple.  The mass measures 23 x 12mm transverse dimension by 35mm craniocaudal dimension.  1cm anterior and slightly inferior to the mass is a second 7mm mass which is connected to the dominant mass by a thin bridge of enhancement. There are no other abnormalities in the right breast.  IMPRESSION: Biopsy-proven invasive carcinoma in the 12 o'clock position of the right breast, with an additional satellite focus as described above.  BI-RADS CATEGORY 6:  Known biopsy-proven malignancy - appropriate action should be taken.  THREE-DIMENSIONAL MR IMAGE RENDERING ON INDEPENDENT WORKSTATION:  Three-dimensional MR images were rendered by post-processing of the original MR data on an independent workstation.  The three- dimensional MR images were interpreted,  and findings were reported in the accompanying complete MRI report for this study.   Original Report Authenticated By: Esperanza Heir, M.D.    Nm Sentinel Node Inj-no Rpt (breast)  12/31/2012  CLINICAL DATA: right breast cancer   Sulfur colloid was injected intradermally by the nuclear medicine  technologist for breast cancer sentinel node localization.     Dg Chest Port 1 View  12/31/2012  *RADIOLOGY REPORT*  Clinical Data: Port-A-Cath placement.  History of breast cancer.  PORTABLE CHEST - 1 VIEW  Comparison: 03/26/2011.  Findings: Central line has been placed from the left.  The tip is at the level of the distal superior vena cava/caval atrial junction.  No gross pneumothorax.  Consolidation medial aspect of the lung bases may be present possibly representing atelectasis or infiltrate.  Follow-up two- view chest with better inspiration recommended for further delineation.  Heart size top normal.  Mild central pulmonary vascular prominence.  Surgical clips project over the right lower chest and may be related to breast reconstruction/axillary lymph node dissection.  IMPRESSION: Central line has been placed from the left.  The tip is at the level of the distal superior vena cava/caval atrial junction.  No gross pneumothorax.  Consolidation medial aspect of the lung bases may be present possibly representing atelectasis or infiltrate.  Follow-up two- view chest with better inspiration recommended for further delineation.   Original Report Authenticated By: Lacy Duverney, M.D.    Dg Fluoro Guide Cv Line-no Report  12/31/2012  CLINICAL DATA: portacath   FLOURO GUIDE CV LINE  Fluoroscopy was utilized by the requesting physician.  No radiographic  interpretation.      ASSESSMENT: 52 year old female with  #1  diagnosis of invasive ductal carcinoma of the right breast she is now status post lumpectomy with sentinel lymph node biopsy with the final pathology revealing a 1.8 cm invasive ductal carcinoma grade 3  ER negative PR negative HER-2/neu amplified at 4.36. 2 sentinel nodes were negative for metastatic disease. There was noted to be lymphovascular invasion. Postoperatively patient is doing well. She will go for a reexcision for positive margin.  #2 subsequently begun on adjuvant chemotherapy consisting of Taxotere carboplatinum and Herceptin starting 01/26/2013 - 04/20/13. She only received 4 cycles of Taxotere and carboplatinum but she will continue Herceptin to finish out a full year.  #3 reflux which she has a chronic history.  #4 neuropathy  Patient will continue Neurontin 3 times a day. She is also on Cymbalta 20 mg daily.  PLAN:  #1 patient will proceed with Herceptin only today.  She will decrease Neurontin to 100mg  TID.  Neuropathy improved with Cymbalta.  Her last echo was 4/17, she will be due again in July.    #2 She will  start radiation therapy on June 4 under the care of Dr. Dayton Scrape  #3 she will return in three week's time for followup  All questions were answered. The patient knows to call the clinic with any problems, questions or concerns. We can certainly see the patient much sooner if necessary.  I spent 25 minutes counseling the patient face to face. The total time spent in the appointment was 30 minutes.  Cherie Ouch Lyn Hollingshead, NP Medical Oncology [facility] Phone: [phone]

## 2013-05-12 ENCOUNTER — Ambulatory Visit: Payer: BC Managed Care – PPO

## 2013-05-12 ENCOUNTER — Ambulatory Visit
Admission: RE | Admit: 2013-05-12 | Discharge: 2013-05-12 | Disposition: A | Discharge status: Home / Self Care | Payer: BC Managed Care – PPO | Source: Ambulatory Visit | Attending: Radiation Oncology | Admitting: Radiation Oncology

## 2013-05-12 ENCOUNTER — Telehealth: Payer: Self-pay | Admitting: Oncology

## 2013-05-12 DIAGNOSIS — C50411 Malignant neoplasm of upper-outer quadrant of right female breast: Secondary | ICD-10-CM

## 2013-05-12 NOTE — Progress Notes (Signed)
Simulation verification note: The patient underwent simulation verification for treatment to her right breast. Her isocenter is in good position and the multileaf collimators contoured the treatment volume appropriately. 

## 2013-05-13 ENCOUNTER — Ambulatory Visit
Admission: RE | Admit: 2013-05-13 | Discharge: 2013-05-13 | Disposition: A | Discharge status: Home / Self Care | Payer: BC Managed Care – PPO | Source: Ambulatory Visit | Attending: Radiation Oncology | Admitting: Radiation Oncology

## 2013-05-14 ENCOUNTER — Ambulatory Visit
Admission: RE | Admit: 2013-05-14 | Discharge: 2013-05-14 | Disposition: A | Discharge status: Home / Self Care | Payer: BC Managed Care – PPO | Source: Ambulatory Visit | Attending: Radiation Oncology | Admitting: Radiation Oncology

## 2013-05-15 ENCOUNTER — Ambulatory Visit
Admission: RE | Admit: 2013-05-15 | Discharge: 2013-05-15 | Disposition: A | Discharge status: Home / Self Care | Payer: BC Managed Care – PPO | Source: Ambulatory Visit | Attending: Radiation Oncology | Admitting: Radiation Oncology

## 2013-05-18 ENCOUNTER — Ambulatory Visit
Admission: RE | Admit: 2013-05-18 | Discharge: 2013-05-18 | Disposition: A | Discharge status: Home / Self Care | Payer: BC Managed Care – PPO | Source: Ambulatory Visit | Attending: Radiation Oncology | Admitting: Radiation Oncology

## 2013-05-18 VITALS — BP 114/82 | HR 72 | Temp 98.0°F | Ht 66.0 in | Wt 234.6 lb

## 2013-05-18 DIAGNOSIS — C50411 Malignant neoplasm of upper-outer quadrant of right female breast: Secondary | ICD-10-CM

## 2013-05-18 MED ORDER — RADIAPLEXRX EX GEL
Freq: Once | CUTANEOUS | Status: AC
  Administered 2013-05-18: 13:00:00 via TOPICAL

## 2013-05-18 MED ORDER — ALRA NON-METALLIC DEODORANT (RAD-ONC)
1.0000 "application " | Freq: Once | TOPICAL | Status: AC
  Administered 2013-05-18: 1 via TOPICAL

## 2013-05-18 NOTE — Progress Notes (Signed)
Weekly Management Note:  Site: Right breast Current Dose:  800  cGy Projected Dose: 5000 cGy (no boost)  Narrative: The patient is seen today for routine under treatment assessment. CBCT/MVCT images/port films were reviewed. The chart was reviewed.   No complaints today. Her oldest daughter is getting married this Saturday.  Physical Examination:  Filed Vitals:   05/18/13 1222  BP: 114/82  Pulse: 72  Temp: 98 F (36.7 C)  .  Weight: 234 lb 9.6 oz (106.414 kg). No significant skin changes.  Impression: Tolerating radiation therapy well.  Plan: Continue radiation therapy as planned.

## 2013-05-18 NOTE — Progress Notes (Signed)
Julia Zuniga here for weekly under treat.  She has had 4 fractions to her right breast.  She denies pain and fatigue.  The skin on her right breast is pink.  She was given the Radiation Therapy and You book and discussed the potential side effects of radiation including fatigue and skin changes.  She was given Alra deoderant and Radioplex gel and instructed to apply it after treatment and at bedtime.

## 2013-05-19 ENCOUNTER — Ambulatory Visit
Admission: RE | Admit: 2013-05-19 | Discharge: 2013-05-19 | Disposition: A | Discharge status: Home / Self Care | Payer: BC Managed Care – PPO | Source: Ambulatory Visit | Attending: Radiation Oncology | Admitting: Radiation Oncology

## 2013-05-20 ENCOUNTER — Ambulatory Visit
Admission: RE | Admit: 2013-05-20 | Discharge: 2013-05-20 | Disposition: A | Discharge status: Home / Self Care | Payer: BC Managed Care – PPO | Source: Ambulatory Visit | Attending: Radiation Oncology | Admitting: Radiation Oncology

## 2013-05-21 ENCOUNTER — Ambulatory Visit
Admission: RE | Admit: 2013-05-21 | Discharge: 2013-05-21 | Disposition: A | Discharge status: Home / Self Care | Payer: BC Managed Care – PPO | Source: Ambulatory Visit | Attending: Radiation Oncology | Admitting: Radiation Oncology

## 2013-05-22 ENCOUNTER — Ambulatory Visit
Admission: RE | Admit: 2013-05-22 | Discharge: 2013-05-22 | Disposition: A | Discharge status: Home / Self Care | Payer: BC Managed Care – PPO | Source: Ambulatory Visit | Attending: Radiation Oncology | Admitting: Radiation Oncology

## 2013-05-25 ENCOUNTER — Ambulatory Visit
Admission: RE | Admit: 2013-05-25 | Discharge: 2013-05-25 | Disposition: A | Discharge status: Home / Self Care | Payer: BC Managed Care – PPO | Source: Ambulatory Visit | Attending: Radiation Oncology | Admitting: Radiation Oncology

## 2013-05-25 VITALS — BP 124/88 | HR 66 | Temp 98.2°F | Wt 236.4 lb

## 2013-05-25 DIAGNOSIS — C50411 Malignant neoplasm of upper-outer quadrant of right female breast: Secondary | ICD-10-CM

## 2013-05-25 NOTE — Progress Notes (Signed)
Weekly Management Note:  Site: Right breast Current Dose:  1800  cGy Projected Dose: 5000  CGy (no boost)  Narrative: The patient is seen today for routine under treatment assessment. CBCT/MVCT images/port films were reviewed. The chart was reviewed.   She is without complaints today except for fatigue, recovering from her daughter's wedding over the weekend.  Physical Examination:  Filed Vitals:   05/25/13 1049  BP: 124/88  Pulse: 66  Temp: 98.2 F (36.8 C)  .  Weight: 236 lb 6.4 oz (107.23 kg). There is slight erythema along the lower axilla and inframammary region. No areas of desquamation.  Impression: Tolerating radiation therapy well.  Plan: Continue radiation therapy as planned.

## 2013-05-25 NOTE — Progress Notes (Signed)
Patient here for routine weekly assessment of radiation to right breast.Completed 8 of 25 treatments.Skin is pink.Breast tender to touch.No fatigue.

## 2013-05-26 ENCOUNTER — Ambulatory Visit
Admission: RE | Admit: 2013-05-26 | Discharge: 2013-05-26 | Disposition: A | Discharge status: Home / Self Care | Payer: BC Managed Care – PPO | Source: Ambulatory Visit | Attending: Radiation Oncology | Admitting: Radiation Oncology

## 2013-05-27 ENCOUNTER — Ambulatory Visit
Admission: RE | Admit: 2013-05-27 | Discharge: 2013-05-27 | Disposition: A | Discharge status: Home / Self Care | Payer: BC Managed Care – PPO | Source: Ambulatory Visit | Attending: Radiation Oncology | Admitting: Radiation Oncology

## 2013-05-28 ENCOUNTER — Ambulatory Visit
Admission: RE | Admit: 2013-05-28 | Discharge: 2013-05-28 | Disposition: A | Discharge status: Home / Self Care | Payer: BC Managed Care – PPO | Source: Ambulatory Visit | Attending: Radiation Oncology | Admitting: Radiation Oncology

## 2013-05-29 ENCOUNTER — Ambulatory Visit: Payer: BC Managed Care – PPO

## 2013-06-01 ENCOUNTER — Other Ambulatory Visit (HOSPITAL_BASED_OUTPATIENT_CLINIC_OR_DEPARTMENT_OTHER): Payer: BC Managed Care – PPO | Admitting: Lab

## 2013-06-01 ENCOUNTER — Ambulatory Visit (HOSPITAL_BASED_OUTPATIENT_CLINIC_OR_DEPARTMENT_OTHER): Payer: BC Managed Care – PPO | Admitting: Adult Health

## 2013-06-01 ENCOUNTER — Encounter: Payer: Self-pay | Admitting: Adult Health

## 2013-06-01 ENCOUNTER — Ambulatory Visit: Admission: RE | Admit: 2013-06-01 | Payer: BC Managed Care – PPO | Source: Ambulatory Visit

## 2013-06-01 ENCOUNTER — Encounter: Payer: Self-pay | Admitting: Oncology

## 2013-06-01 ENCOUNTER — Telehealth: Payer: Self-pay | Admitting: *Deleted

## 2013-06-01 ENCOUNTER — Ambulatory Visit (HOSPITAL_BASED_OUTPATIENT_CLINIC_OR_DEPARTMENT_OTHER): Payer: BC Managed Care – PPO

## 2013-06-01 ENCOUNTER — Ambulatory Visit: Payer: BC Managed Care – PPO

## 2013-06-01 VITALS — BP 137/87 | HR 68 | Temp 98.6°F | Resp 20 | Ht 65.0 in | Wt 233.2 lb

## 2013-06-01 DIAGNOSIS — C50919 Malignant neoplasm of unspecified site of unspecified female breast: Secondary | ICD-10-CM

## 2013-06-01 DIAGNOSIS — Z5112 Encounter for antineoplastic immunotherapy: Secondary | ICD-10-CM

## 2013-06-01 DIAGNOSIS — G589 Mononeuropathy, unspecified: Secondary | ICD-10-CM

## 2013-06-01 DIAGNOSIS — Z171 Estrogen receptor negative status [ER-]: Secondary | ICD-10-CM

## 2013-06-01 DIAGNOSIS — C50411 Malignant neoplasm of upper-outer quadrant of right female breast: Secondary | ICD-10-CM

## 2013-06-01 LAB — CBC WITH DIFFERENTIAL/PLATELET
BASO%: 0.9 % (ref 0.0–2.0)
EOS%: 3.6 % (ref 0.0–7.0)
Eosinophils Absolute: 0.2 10*3/uL (ref 0.0–0.5)
LYMPH%: 35.6 % (ref 14.0–49.7)
MCH: 26.7 pg (ref 25.1–34.0)
MCHC: 31.6 g/dL (ref 31.5–36.0)
MCV: 84.6 fL (ref 79.5–101.0)
MONO%: 6.9 % (ref 0.0–14.0)
NEUT#: 2.8 10*3/uL (ref 1.5–6.5)
RBC: 4.15 10*6/uL (ref 3.70–5.45)
RDW: 12.9 % (ref 11.2–14.5)

## 2013-06-01 LAB — COMPREHENSIVE METABOLIC PANEL (CC13)
AST: 16 U/L (ref 5–34)
Alkaline Phosphatase: 110 U/L (ref 40–150)
BUN: 9 mg/dL (ref 7.0–26.0)
Calcium: 9.7 mg/dL (ref 8.4–10.4)
Chloride: 106 mEq/L (ref 98–107)
Creatinine: 0.6 mg/dL (ref 0.6–1.1)

## 2013-06-01 MED ORDER — ACETAMINOPHEN 325 MG PO TABS
650.0000 mg | ORAL_TABLET | Freq: Once | ORAL | Status: AC
  Administered 2013-06-01: 650 mg via ORAL

## 2013-06-01 MED ORDER — HEPARIN SOD (PORK) LOCK FLUSH 100 UNIT/ML IV SOLN
500.0000 [IU] | Freq: Once | INTRAVENOUS | Status: DC | PRN
  Filled 2013-06-01: qty 5

## 2013-06-01 MED ORDER — SODIUM CHLORIDE 0.9 % IV SOLN
Freq: Once | INTRAVENOUS | Status: AC
  Administered 2013-06-01: 12:00:00 via INTRAVENOUS

## 2013-06-01 MED ORDER — TRASTUZUMAB CHEMO INJECTION 440 MG
6.0000 mg/kg | Freq: Once | INTRAVENOUS | Status: AC
  Administered 2013-06-01: 630 mg via INTRAVENOUS
  Filled 2013-06-01: qty 30

## 2013-06-01 MED ORDER — SODIUM CHLORIDE 0.9 % IJ SOLN
10.0000 mL | INTRAMUSCULAR | Status: DC | PRN
  Filled 2013-06-01: qty 10

## 2013-06-01 NOTE — Patient Instructions (Signed)
[facility] Discharge Instructions for Patients Receiving Chemotherapy  Today you received the following chemotherapy agents :  Herceptin  To help prevent nausea and vomiting after your treatment, we encourage you to take your nausea medication as needed.   If you develop nausea and vomiting that is not controlled by your nausea medication, call the clinic.   BELOW ARE SYMPTOMS THAT SHOULD BE REPORTED IMMEDIATELY:  *FEVER GREATER THAN 100.5 F  *CHILLS WITH OR WITHOUT FEVER  NAUSEA AND VOMITING THAT IS NOT CONTROLLED WITH YOUR NAUSEA MEDICATION  *UNUSUAL SHORTNESS OF BREATH  *UNUSUAL BRUISING OR BLEEDING  TENDERNESS IN MOUTH AND THROAT WITH OR WITHOUT PRESENCE OF ULCERS  *URINARY PROBLEMS  *BOWEL PROBLEMS  UNUSUAL RASH Items with * indicate a potential emergency and should be followed up as soon as possible.  Feel free to call the clinic you have any questions or concerns. The clinic phone number is [phone].    

## 2013-06-01 NOTE — Telephone Encounter (Signed)
appts made and printed. Pt is aware that tx will follow nafter her ov on 08/03/13. i emailed [facility] to add a tx for 08/03/13...td

## 2013-06-01 NOTE — Patient Instructions (Signed)
Doing well.  Proceed with Herceptin.  We will see you back in 3 weeks.  Please call us if you have any questions or concerns.    

## 2013-06-01 NOTE — Progress Notes (Signed)
OFFICE PROGRESS NOTE  CC  DOOLITTLE, Harrel Lemon, MD [address] [place] [address] Dr. Almond Lint Dr. Lurline Hare  DIAGNOSIS: 52 year old female with invasive ductal carcinoma of the right breast diagnosed December 2013.  PRIOR THERAPY:  #1 patient was originally seen in the multidisciplinary breast clinic on 12/17/2012 with a screening mammogram that showed an abnormality in the right breast. This abnormality measured 1.6 cm. She had a biopsy performed that showed invasive ductal carcinoma grade 2, ER negative, PR negative, HER-2 positive with a Ki-67 of 80%.  #2 patient is now status post right lumpectomy on 12/31/2012 with the pathology revealing a 8 cm invasive ductal carcinoma grade 3 with lymphovascular invasion, ER negative PR negative HER-2/neu positive with amplification of 4.36, 2 sentinel nodes were negative for metastatic disease. Patient did have positive margins and she will need a reexcision which will be performed on 01/14/2013. Patient has a Port-A-Cath in place.  #3 patient will proceed with adjuvant chemotherapy initially consisting of Taxotere carboplatinum and Herceptin. With Taxotere carboplatinum to be given every 21 days and Herceptin on a weekly basis for a total of 6 cycles of TC.we will plan on starting her first cycle of chemotherapy on 01/26/2013 - 04/20/13. She stopped Taxotere carboplatin after 4 cycles, it was discontinued due to worsening neuropathy.    #4 Radiation therapy starting 05/13/13   #5 Adjuvant Herceptin every 3 weeks starting 05/11/13.    CURRENT THERAPY: Herceptin weekly  INTERVAL HISTORY: LISSETH BRAZEAU 52 y.o. female returns for followup today. She continues to have neuropathy and is taking Cymbalta daily and Neurontin BID.  She is tolerating radiation well and is yet to have any skin breakdown.   Her last echo was 4/17 and her next appt for echo and Dr. Gala Romney on 06/18/13.  She denies swelling, dyspnea on exertion, orthopnea, PND,  chest pain, palpitations, or any further concerns.  A 10 point ROS is negative.    MEDICAL HISTORY: Past Medical History  Diagnosis Date  . Hyperlipidemia   . Diverticulitis   . Hernia, hiatal   . Heartburn   . Anxiety   . Hot flashes   . PONV (postoperative nausea and vomiting)   . Breast cancer 12/08/12    Right    ALLERGIES:  is allergic to chlorhexidine; codeine; medralone; and oxycodone-acetaminophen.  MEDICATIONS:  Current Outpatient Prescriptions  Medication Sig Dispense Refill  . atorvastatin (LIPITOR) 40 MG tablet Take 40 mg by mouth every evening.      . B Complex-C (SUPER B COMPLEX PO) Take 1 tablet by mouth daily.      . carvedilol (COREG) 6.25 MG tablet Take 1 tablet (6.25 mg total) by mouth 2 (two) times daily with a meal.  60 tablet  6  . dexamethasone (DECADRON) 4 MG tablet Take 2 tablets (8 mg total) by mouth 2 (two) times daily with a meal. Take two times a day the day before Taxotere. Then take two times a day starting the day after chemo for 3 days.  30 tablet  1  . DULoxetine (CYMBALTA) 20 MG capsule Take 1 capsule (20 mg total) by mouth daily.  30 capsule  4  . gabapentin (NEURONTIN) 100 MG capsule Take 2 capsules three times a day  180 capsule  6  . HYDROcodone-acetaminophen (NORCO) 5-325 MG per tablet Take 1-2 tablets by mouth every 6 (six) hours as needed for pain.  30 tablet  1  . ibuprofen (ADVIL,MOTRIN) 200 MG tablet Take 400 mg by  mouth every 6 (six) hours as needed. Pain      . omeprazole (PRILOSEC) 40 MG capsule Take 1 capsule (40 mg total) by mouth daily.  90 capsule  2   No current facility-administered medications for this visit.    SURGICAL HISTORY:  Past Surgical History  Procedure Laterality Date  . Cesarean section      x 3  . Tubal ligation    . Tonsillectomy and adenoidectomy    . Cholecystectomy  2001    lap choli  . Breast lumpectomy with sentinel lymph node biopsy  12/31/2012    Procedure: BREAST LUMPECTOMY WITH SENTINEL LYMPH NODE  BX;  Surgeon: Almond Lint, MD;  Location: [facility];  Service: General;  Laterality: Right;  . Portacath placement  12/31/2012    Procedure: INSERTION PORT-A-CATH;  Surgeon: Almond Lint, MD;  Location: [facility];  Service: General;  Laterality: Left;  Left Subclavian Vein  . Re-excision of breast cancer,superior margins  01/14/2013    Procedure: RE-EXCISION OF BREAST CANCER,SUPERIOR MARGINS;  Surgeon: Almond Lint, MD;  Location: [facility];  Service: General;  Laterality: Right;  right breast re excision for markers     REVIEW OF SYSTEMS:   General: fatigue (-), night sweats (-), fever (-), pain (-) Lymph: palpable nodes (-) HEENT: vision changes (-), mucositis (-), gum bleeding (-), epistaxis (-) Cardiovascular: chest pain (-), palpitations (-) Pulmonary: shortness of breath (-), dyspnea on exertion (-), cough (-), hemoptysis (-) GI:  Early satiety (-), melena (-), dysphagia (-), nausea/vomiting (-), diarrhea (-) GU: dysuria (-), hematuria (-), incontinence (-) Musculoskeletal: joint swelling (-), joint pain (-), back pain (-) Neuro: weakness (-), numbness (+), headache (-), confusion (-) Skin: Rash (-), lesions (-), dryness (-) Psych: depression (-), suicidal/homicidal ideation (-), feeling of hopelessness (-)  PHYSICAL EXAMINATION: Blood pressure 137/87, pulse 68, temperature 98.6 F (37 C), temperature source Oral, resp. rate 20, height 5\' 5"  (1.651 m), weight 233 lb 3.2 oz (105.779 kg), last menstrual period 01/07/2009. Body mass index is 38.81 kg/(m^2). General: Patient is a well appearing female in no acute distress HEENT: PERRLA, sclerae anicteric no conjunctival pallor, MMM,  Neck: supple, no palpable adenopathy Lungs: clear to auscultation bilaterally, no wheezes, rhonchi, or rales Cardiovascular: regular rate rhythm, S1, S2, no murmurs, rubs or gallops Abdomen: Soft, non-tender, non-distended, normoactive bowel sounds, no HSM Extremities: warm and  well perfused, no clubbing, cyanosis, or edema Skin: No rashes or lesions Neuro: Non-focal ECOG PERFORMANCE STATUS: 0 - Asymptomatic Right breast lumpectomy scar healed, no nodularity, left breast, no masses or nipple discharge   LABORATORY DATA: Lab Results  Component Value Date   WBC 5.3 06/01/2013   HGB 11.1* 06/01/2013   HCT 35.1 06/01/2013   MCV 84.6 06/01/2013   PLT 164 06/01/2013      Chemistry      Component Value Date/Time   NA 142 05/11/2013 0806   NA 135 02/24/2013 1857   K 4.3 05/11/2013 0806   K 3.4* 02/24/2013 1857   CL 107 05/11/2013 0806   CL 97 02/24/2013 1857   CO2 26 05/11/2013 0806   CO2 29 02/24/2013 1857   BUN 8.0 05/11/2013 0806   BUN 6 02/24/2013 1857   CREATININE 0.6 05/11/2013 0806   CREATININE 0.55 02/24/2013 1857      Component Value Date/Time   CALCIUM 9.5 05/11/2013 0806   CALCIUM 9.2 02/24/2013 1857   ALKPHOS 108 05/11/2013 0806   ALKPHOS 113 02/24/2013 1857  AST 18 05/11/2013 0806   AST 18 02/24/2013 1857   ALT 27 05/11/2013 0806   ALT 30 02/24/2013 1857   BILITOT 0.50 05/11/2013 0806   BILITOT 0.2* 02/24/2013 1857    ADDITIONAL INFORMATION: 1. PROGNOSTIC INDICATORS - ACIS Results IMMUNOHISTOCHEMICAL AND MORPHOMETRIC ANALYSIS BY THE AUTOMATED CELLULAR IMAGING SYSTEM (ACIS) Estrogen Receptor (Negative, <1%): 0%, NEGATIVE Progesterone Receptor (Negative, <1%): 0%, NEGATIVE COMMENT: The negative hormone receptor study(ies) in this case have an internal positive control. All controls stained appropriately Pecola Leisure MD Pathologist, Electronic Signature ( Signed 01/07/2013) 1. CHROMOGENIC IN-SITU HYBRIDIZATION Interpretation: HER2/NEU BY CISH - SHOWS AMPLIFICATION BY CISH ANALYSIS. THE RATIO OF HER2: CEP 17 SIGNALS WAS 4.36. Reference range: Ratio: HER2:CEP17 < 1.8 gene amplification not observed Ratio: HER2:CEP 17 1.8-2.2 - equivocal result Ratio: HER2:CEP17 > 2.2 - gene amplification observed 1 of 4 FINAL for PHUNG, KOTAS ([id number]) ADDITIONAL  INFORMATION:(continued) Pecola Leisure MD Pathologist, Electronic Signature ( Signed 01/07/2013) FINAL DIAGNOSIS Diagnosis 1. Breast, lumpectomy, Right - INVASIVE DUCTAL CARCINOMA, GRADE III (1.8 CM), SEE COMMENT. - LYMPHOVASCULAR INVASION IDENTIFIED. - INVASIVE TUMOR IS 1 MM TO NEAREST MARGIN (POSTERIOR). - DUCTAL CARCINOMA IN SITU, GRADE III WITH COMEDO NECROSIS. - IN SITU CARCINOMA IS LESS THAN 0.1 MM FROM ANTERIOR-INFERIOR AND POSTERIOR MARGINS. - SEE TUMOR TEMPLATE BELOW. 2. Lymph node, sentinel, biopsy, Right axillary - ONE LYMPH NODE, NEGATIVE FOR TUMOR (0/1), SEE COMMENT. 3. Lymph node, sentinel, biopsy, Right axillary - ONE LYMPH NODE, NEGATIVE FOR TUMOR (0/1), SEE COMMENT. Microscopic Comment 1. BREAST, INVASIVE TUMOR, WITH LYMPH NODE SAMPLING Specimen, including laterality: Right breast Procedure: Lumpectomy Grade: III of III Tubule formation: II Nuclear pleomorphism: III Mitotic:III Tumor size (gross measurement): 1.8 cm Margins: Invasive, distance to closest margin: 0.6 cm In-situ, distance to closest margin: less than 0.1 cm If margin positive, focally or broadly: N/A Lymphovascular invasion: Present Ductal carcinoma in situ: Present Grade: III of III Extensive intraductal component: Absent Lobular neoplasia: Absent Tumor focality: Unifocal Treatment effect: None If present, treatment effect in breast tissue, lymph nodes or both: N/A Extent of tumor: Skin: N/A Nipple: N/A Skeletal muscle: N/A Lymph nodes: # examined: 2 Lymph nodes with metastasis: 0 Breast prognostic profile: Estrogen receptor: Repeated, previous study demonstrated 0% positivity ([id number]) 2 of 4 FINAL for HADASSAH, RANA ([id number]) Microscopic Comment(continued) Progesterone receptor: Repeated, previous study demonstrated 0% positivity ([id number]) Her 2 neu: Repeated, previous study demonstrated amplification (3.88) ([id number]) Ki-67: Not repeated, previous study demonstrated  83% proliferation rate ([id number]) Non-neoplastic breast: Previous biopsy site, fibrocystic change and microcalcifications TNM: pT1c pN0 pMX   RADIOGRAPHIC STUDIES:  Mr Breast Bilateral W Wo Contrast  12/23/2012  *RADIOLOGY REPORT*  Clinical Data: recent diagnosis of invasive ductal carcinoma 12 o'clock right breast, for treatment planning  BILATERAL BREAST MRI WITH AND WITHOUT CONTRAST  Technique: Multiplanar, multisequence MR images of both breasts were obtained prior to and following the intravenous administration of 20ml of multihance.  Three dimensional images were evaluated at the independent DynaCad workstation.  Comparison:  mammographic and ultrasound images 12/08/12 and 11/20/12  Findings: There is mild background parenchymal enhancement.  The left breast is negative. There is no evidence of axillary or internal mammary adenopathy.  There is no abnormal skin or nipple enhancement.  On the right, associated with the recently placed biopsy marker clip, there is an enhancing spiculated mass in the 12 o'clock position 11cm from the nipple.  The mass measures 23 x 12mm transverse dimension by 35mm craniocaudal dimension.  1cm anterior and slightly  inferior to the mass is a second 7mm mass which is connected to the dominant mass by a thin bridge of enhancement. There are no other abnormalities in the right breast.  IMPRESSION: Biopsy-proven invasive carcinoma in the 12 o'clock position of the right breast, with an additional satellite focus as described above.  BI-RADS CATEGORY 6:  Known biopsy-proven malignancy - appropriate action should be taken.  THREE-DIMENSIONAL MR IMAGE RENDERING ON INDEPENDENT WORKSTATION:  Three-dimensional MR images were rendered by post-processing of the original MR data on an independent workstation.  The three- dimensional MR images were interpreted, and findings were reported in the accompanying complete MRI report for this study.   Original Report Authenticated By:  Esperanza Heir, M.D.    Nm Sentinel Node Inj-no Rpt (breast)  12/31/2012  CLINICAL DATA: right breast cancer   Sulfur colloid was injected intradermally by the nuclear medicine  technologist for breast cancer sentinel node localization.     Dg Chest Port 1 View  12/31/2012  *RADIOLOGY REPORT*  Clinical Data: Port-A-Cath placement.  History of breast cancer.  PORTABLE CHEST - 1 VIEW  Comparison: 03/26/2011.  Findings: Central line has been placed from the left.  The tip is at the level of the distal superior vena cava/caval atrial junction.  No gross pneumothorax.  Consolidation medial aspect of the lung bases may be present possibly representing atelectasis or infiltrate.  Follow-up two- view chest with better inspiration recommended for further delineation.  Heart size top normal.  Mild central pulmonary vascular prominence.  Surgical clips project over the right lower chest and may be related to breast reconstruction/axillary lymph node dissection.  IMPRESSION: Central line has been placed from the left.  The tip is at the level of the distal superior vena cava/caval atrial junction.  No gross pneumothorax.  Consolidation medial aspect of the lung bases may be present possibly representing atelectasis or infiltrate.  Follow-up two- view chest with better inspiration recommended for further delineation.   Original Report Authenticated By: Lacy Duverney, M.D.    Dg Fluoro Guide Cv Line-no Report  12/31/2012  CLINICAL DATA: portacath   FLOURO GUIDE CV LINE  Fluoroscopy was utilized by the requesting physician.  No radiographic  interpretation.      ASSESSMENT: 52 year old female with  #1  diagnosis of invasive ductal carcinoma of the right breast she is now status post lumpectomy with sentinel lymph node biopsy with the final pathology revealing a 1.8 cm invasive ductal carcinoma grade 3 ER negative PR negative HER-2/neu amplified at 4.36. 2 sentinel nodes were negative for metastatic disease. There was  noted to be lymphovascular invasion. Postoperatively patient is doing well. She will go for a reexcision for positive margin.  #2 subsequently begun on adjuvant chemotherapy consisting of Taxotere carboplatinum and Herceptin starting 01/26/2013 - 04/20/13. She only received 4 cycles of Taxotere and carboplatinum but she will continue Herceptin to finish out a full year. She has also began adjuvant radiation therapy beginning 05/23/13.  #3 reflux which she has a chronic history.  #4 neuropathy  Patient will continue NeurontinBID. She is also on Cymbalta 20 mg daily.  PLAN:  #1 patient will proceed with Herceptin today.  She will continue Neurontin BID and Cymbalta for the neuropathy.      #2 She will continue radiation therapy with Dr. Dayton Scrape.    #3 she will return in three week's time for followup and Herceptin.    All questions were answered. The patient knows to call the clinic with any problems,  questions or concerns. We can certainly see the patient much sooner if necessary.  I spent 25 minutes counseling the patient face to face. The total time spent in the appointment was 30 minutes.  Cherie Ouch Lyn Hollingshead, NP Medical Oncology [facility] Phone: [phone]

## 2013-06-01 NOTE — Telephone Encounter (Signed)
Per staff message and POF I have scheduled appts.  JMW  

## 2013-06-02 ENCOUNTER — Ambulatory Visit
Admission: RE | Admit: 2013-06-02 | Discharge: 2013-06-02 | Disposition: A | Discharge status: Home / Self Care | Payer: BC Managed Care – PPO | Source: Ambulatory Visit | Attending: Radiation Oncology | Admitting: Radiation Oncology

## 2013-06-02 ENCOUNTER — Encounter: Payer: Self-pay | Admitting: Radiation Oncology

## 2013-06-02 VITALS — BP 118/71 | HR 68 | Resp 18 | Wt 236.0 lb

## 2013-06-02 DIAGNOSIS — C50411 Malignant neoplasm of upper-outer quadrant of right female breast: Secondary | ICD-10-CM

## 2013-06-02 NOTE — Progress Notes (Signed)
Weekly Management Note:  Site: Right breast Current Dose:  2600  cGy Projected Dose: 5000  cGy  Narrative: The patient is seen today for routine under treatment assessment. CBCT/MVCT images/port films were reviewed. The chart was reviewed.   She is without complaints today. She has Radioplex gel to use when necessary.  Physical Examination:  Filed Vitals:   06/02/13 1007  BP: 118/71  Pulse: 68  Resp: 18  .  Weight: 236 lb (107.049 kg). There is mild hyperpigmentation and erythema of the skin with no areas of desquamation.  Impression: Tolerating radiation therapy well.  Plan: Continue radiation therapy as planned.

## 2013-06-02 NOTE — Progress Notes (Signed)
Reports only mild hyperpigmentation of right/treated without desquamation noted. Reports using radiaplex bid. Denies fatigue. Denies any complaints at this time.

## 2013-06-03 ENCOUNTER — Ambulatory Visit: Payer: BC Managed Care – PPO

## 2013-06-04 ENCOUNTER — Ambulatory Visit
Admission: RE | Admit: 2013-06-04 | Discharge: 2013-06-04 | Disposition: A | Discharge status: Home / Self Care | Payer: BC Managed Care – PPO | Source: Ambulatory Visit | Attending: Radiation Oncology | Admitting: Radiation Oncology

## 2013-06-05 ENCOUNTER — Ambulatory Visit
Admission: RE | Admit: 2013-06-05 | Discharge: 2013-06-05 | Disposition: A | Discharge status: Home / Self Care | Payer: BC Managed Care – PPO | Source: Ambulatory Visit | Attending: Radiation Oncology | Admitting: Radiation Oncology

## 2013-06-06 ENCOUNTER — Ambulatory Visit: Payer: BC Managed Care – PPO

## 2013-06-06 ENCOUNTER — Ambulatory Visit
Admission: RE | Admit: 2013-06-06 | Discharge: 2013-06-06 | Disposition: A | Discharge status: Home / Self Care | Payer: BC Managed Care – PPO | Source: Ambulatory Visit | Attending: Radiation Oncology | Admitting: Radiation Oncology

## 2013-06-08 ENCOUNTER — Ambulatory Visit
Admission: RE | Admit: 2013-06-08 | Discharge: 2013-06-08 | Disposition: A | Discharge status: Home / Self Care | Payer: BC Managed Care – PPO | Source: Ambulatory Visit | Attending: Radiation Oncology | Admitting: Radiation Oncology

## 2013-06-08 ENCOUNTER — Encounter: Payer: Self-pay | Admitting: Radiation Oncology

## 2013-06-08 VITALS — Temp 98.7°F | Ht 65.0 in | Wt 235.4 lb

## 2013-06-08 DIAGNOSIS — C50411 Malignant neoplasm of upper-outer quadrant of right female breast: Secondary | ICD-10-CM

## 2013-06-08 MED ORDER — BIAFINE EX EMUL: CUTANEOUS | Status: DC | PRN

## 2013-06-08 NOTE — Progress Notes (Addendum)
.    Ms. Skalicky has received 17 fractions to her right breast.  She reports tenderness right breast - Level 3-4.  Her  Erythema noted in the upper outer region of her right breast, near the axilla and in the the areola region. Note rash-like area in the upper inner portion of her  Right breast.  She reports that since last week she notes a metallic taste in her mouth with dryness which is the same as when she had chemotherapy immediately following radiation therapy.

## 2013-06-08 NOTE — Progress Notes (Signed)
   Weekly Management Note:  outpatient Current Dose:  34 Gy  Projected Dose: 50 Gy   Narrative:  The patient presents for routine under treatment assessment.  CBCT/MVCT images/Port film x-rays were reviewed.  The chart was checked. Doing well. A little pruritis in UIQ of right breast.   Physical Findings:  height is 5\' 5"  (1.651 m) and weight is 235 lb 6.4 oz (106.777 kg). Her temperature is 98.7 F (37.1 C).  diffuse hyperpigmentation of right breast, moderate, with increased dryness in UIQ.  Skin intact.  Impression:  The patient is tolerating radiotherapy.  Plan:  Continue radiotherapy as planned. Start Biafine, add 1% hydrocortisone cream PRN pruritis UIQ  ________________________________   Lonie Peak, M.D.

## 2013-06-09 ENCOUNTER — Ambulatory Visit
Admission: RE | Admit: 2013-06-09 | Discharge: 2013-06-09 | Disposition: A | Discharge status: Home / Self Care | Payer: BC Managed Care – PPO | Source: Ambulatory Visit | Attending: Radiation Oncology | Admitting: Radiation Oncology

## 2013-06-10 ENCOUNTER — Ambulatory Visit
Admission: RE | Admit: 2013-06-10 | Discharge: 2013-06-10 | Disposition: A | Discharge status: Home / Self Care | Payer: BC Managed Care – PPO | Source: Ambulatory Visit | Attending: Radiation Oncology | Admitting: Radiation Oncology

## 2013-06-11 ENCOUNTER — Ambulatory Visit
Admission: RE | Admit: 2013-06-11 | Discharge: 2013-06-11 | Disposition: A | Discharge status: Home / Self Care | Payer: BC Managed Care – PPO | Source: Ambulatory Visit | Attending: Radiation Oncology | Admitting: Radiation Oncology

## 2013-06-15 ENCOUNTER — Ambulatory Visit
Admission: RE | Admit: 2013-06-15 | Discharge: 2013-06-15 | Disposition: A | Discharge status: Home / Self Care | Payer: BC Managed Care – PPO | Source: Ambulatory Visit | Attending: Radiation Oncology | Admitting: Radiation Oncology

## 2013-06-15 ENCOUNTER — Encounter: Payer: Self-pay | Admitting: Radiation Oncology

## 2013-06-15 VITALS — BP 113/72 | HR 66 | Resp 16 | Wt 235.5 lb

## 2013-06-15 DIAGNOSIS — C50411 Malignant neoplasm of upper-outer quadrant of right female breast: Secondary | ICD-10-CM

## 2013-06-15 NOTE — Progress Notes (Signed)
Dermatitis continues right upper chest. Reports using biafine as directed. Reports on mild fatigue. Complete treatment on Thursday.

## 2013-06-15 NOTE — Progress Notes (Signed)
Weekly Management Note:  Site: Right breast Current Dose:  4400  cGy Projected Dose: 5000  cGy  Narrative: The patient is seen today for routine under treatment assessment. CBCT/MVCT images/port films were reviewed. The chart was reviewed.   No complaints today. She uses Biafine cream when necessary. She finishes her treatment this Thursday.  Physical Examination:  Filed Vitals:   06/15/13 1034  BP: 113/72  Pulse: 66  Resp: 16  .  Weight: 235 lb 8 oz (106.822 kg). There is moderate erythema and hyperpigmentation of the skin along the right breast with no areas of desquamation.  Impression: Tolerating radiation therapy well.  Plan: Continue radiation therapy as planned. She finishes her treatment this Thursday. She will be given a one-month followup visit.

## 2013-06-16 ENCOUNTER — Ambulatory Visit
Admission: RE | Admit: 2013-06-16 | Discharge: 2013-06-16 | Disposition: A | Discharge status: Home / Self Care | Payer: BC Managed Care – PPO | Source: Ambulatory Visit | Attending: Radiation Oncology | Admitting: Radiation Oncology

## 2013-06-17 ENCOUNTER — Ambulatory Visit
Admission: RE | Admit: 2013-06-17 | Discharge: 2013-06-17 | Disposition: A | Discharge status: Home / Self Care | Payer: BC Managed Care – PPO | Source: Ambulatory Visit | Attending: Radiation Oncology | Admitting: Radiation Oncology

## 2013-06-18 ENCOUNTER — Ambulatory Visit (HOSPITAL_COMMUNITY)
Admission: RE | Admit: 2013-06-18 | Discharge: 2013-06-18 | Disposition: A | Discharge status: Home / Self Care | Payer: BC Managed Care – PPO | Source: Ambulatory Visit | Attending: Internal Medicine | Admitting: Internal Medicine

## 2013-06-18 ENCOUNTER — Ambulatory Visit
Admission: RE | Admit: 2013-06-18 | Discharge: 2013-06-18 | Disposition: A | Discharge status: Home / Self Care | Payer: BC Managed Care – PPO | Source: Ambulatory Visit | Attending: Radiation Oncology | Admitting: Radiation Oncology

## 2013-06-18 ENCOUNTER — Ambulatory Visit: Payer: BC Managed Care – PPO

## 2013-06-18 ENCOUNTER — Encounter (HOSPITAL_COMMUNITY): Payer: Self-pay

## 2013-06-18 ENCOUNTER — Ambulatory Visit (HOSPITAL_BASED_OUTPATIENT_CLINIC_OR_DEPARTMENT_OTHER)
Admission: RE | Admit: 2013-06-18 | Discharge: 2013-06-18 | Disposition: A | Discharge status: Home / Self Care | Payer: BC Managed Care – PPO | Source: Ambulatory Visit | Attending: Internal Medicine | Admitting: Internal Medicine

## 2013-06-18 ENCOUNTER — Other Ambulatory Visit (HOSPITAL_COMMUNITY): Payer: Self-pay | Admitting: Internal Medicine

## 2013-06-18 VITALS — BP 122/78 | HR 59 | Wt 235.8 lb

## 2013-06-18 DIAGNOSIS — I509 Heart failure, unspecified: Secondary | ICD-10-CM

## 2013-06-18 DIAGNOSIS — R0989 Other specified symptoms and signs involving the circulatory and respiratory systems: Secondary | ICD-10-CM | POA: Insufficient documentation

## 2013-06-18 DIAGNOSIS — C50411 Malignant neoplasm of upper-outer quadrant of right female breast: Secondary | ICD-10-CM

## 2013-06-18 DIAGNOSIS — C50419 Malignant neoplasm of upper-outer quadrant of unspecified female breast: Secondary | ICD-10-CM | POA: Insufficient documentation

## 2013-06-18 DIAGNOSIS — Z09 Encounter for follow-up examination after completed treatment for conditions other than malignant neoplasm: Secondary | ICD-10-CM

## 2013-06-18 DIAGNOSIS — R0609 Other forms of dyspnea: Secondary | ICD-10-CM | POA: Insufficient documentation

## 2013-06-18 NOTE — Patient Instructions (Addendum)
Follow up in 3 months with an ECHO and Dr Bensimhon 

## 2013-06-18 NOTE — Progress Notes (Signed)
  Echocardiogram 2D Echocardiogram has been performed.  Jorje Guild 06/18/2013, 9:41 AM

## 2013-06-18 NOTE — Progress Notes (Signed)
Patient ID: Julia Zuniga, female   DOB: 07/21/1961, 52 y.o.   MRN: [id number] Referring Physician: Dr. Welton Flakes Primary Care: Dr. Ellamae Sia Primary Cardiologist: new  HPI: Julia Zuniga ia a 52 y.o. 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 HER-2/neu was amplified at 3.88.  S/P R lumpectomy on 12/31/12.  +family history of CAD.  She has completed 4 cycles of  taxotere, carboplatinum and herceptin.  Completed radiation today. She will continue herceptin every three weeks until February 2015.   Echos: 01/06/13: EF 55-60%, lat s' 9.2 03/26/13: EF 55-60% lat s' 9.8 06/18/13 EF lat s' 55--60 % lat s' 9.7 Global Strain -15.8  She returns for follow up today. Complain of fatigue and neuropathy.  Denies SOB/PND/Orthopnea.  She is feeling better.    Review of Systems: All pertinent positives and negatives as in HPI, otherwise negative.   Past Medical History  Diagnosis Date  . Hyperlipidemia   . Diverticulitis   . Hernia, hiatal   . Heartburn   . Anxiety   . Hot flashes   . PONV (postoperative nausea and vomiting)   . Breast cancer 12/08/12    Right    Current Outpatient Prescriptions  Medication Sig Dispense Refill  . atorvastatin (LIPITOR) 40 MG tablet Take 40 mg by mouth every evening.      . B Complex-C (SUPER B COMPLEX PO) Take 1 tablet by mouth daily.      . carvedilol (COREG) 6.25 MG tablet Take 1 tablet (6.25 mg total) by mouth 2 (two) times daily with a meal.  60 tablet  6  . DULoxetine (CYMBALTA) 20 MG capsule Take 1 capsule (20 mg total) by mouth daily.  30 capsule  4  . emollient (BIAFINE) cream Apply 1 % topically 2 (two) times daily.      Marland Kitchen gabapentin (NEURONTIN) 100 MG capsule Take 2 capsules three times a day  180 capsule  6  . HYDROcodone-acetaminophen (NORCO) 5-325 MG per tablet Take 1-2 tablets by mouth every  6 (six) hours as needed for pain.  30 tablet  1  . ibuprofen (ADVIL,MOTRIN) 200 MG tablet Take 400 mg by mouth every 6 (six) hours as needed. Pain      . non-metallic deodorant (ALRA) MISC Apply 1 application topically daily as needed.      Marland Kitchen omeprazole (PRILOSEC) 40 MG capsule Take 1 capsule (40 mg total) by mouth daily.  90 capsule  2  . Wound Cleansers (RADIAPLEX EX) Apply topically.       No current facility-administered medications for this encounter.    Allergies  Allergen Reactions  . Chlorhexidine     Patient skin gets bright red with rash.      . Codeine Swelling  . Medralone (Methylprednisolone Acetate)     "coming out of skin"   . Oxycodone-Acetaminophen Other (See Comments)    Face got red and hot      PHYSICAL EXAM: Filed Vitals:   06/18/13 0953  BP: 122/78  Pulse: 59  Weight: 235 lb 12.8 oz (106.958 kg)  SpO2: 98%    General:  Well appearing. No respiratory difficulty HEENT: normal Neck: supple. no JVD. Carotids 2+ bilat; no bruits. No lymphadenopathy or thryomegaly appreciated. Cor: PMI nondisplaced. Regular rate & rhythm. No rubs, gallops or murmurs. Lungs: clear Abdomen: obese, soft, nontender, nondistended. No hepatosplenomegaly. No bruits or  masses. Good bowel sounds. Extremities: no cyanosis, clubbing, rash, edema Neuro: alert & oriented x 3, cranial nerves grossly intact. moves all 4 extremities w/o difficulty. Affect pleasant.    ASSESSMENT & PLAN:

## 2013-06-18 NOTE — Assessment & Plan Note (Addendum)
Dr Gala Romney reviewed ECHO. EF and lateral s' stable. No evidence of cardiotoxicity. Follow up in 3 months with an ECHO.   Patient seen and examined with Tonye Becket, NP. We discussed all aspects of the encounter. I agree with the assessment and plan as stated above. I reviewed echos personally. EF and Doppler parameters stable. No HF on exam. Continue Herceptin.

## 2013-06-19 ENCOUNTER — Ambulatory Visit: Payer: BC Managed Care – PPO

## 2013-06-19 ENCOUNTER — Encounter: Payer: Self-pay | Admitting: Radiation Oncology

## 2013-06-19 NOTE — Progress Notes (Signed)
[facility] Radiation Oncology End of Treatment Note  Name:Julia Zuniga  Date: 06/19/2013 ZOX:[id number] DOB:1961-09-17   Status:outpatient    CC: Tonye Pearson, MD  Dr Almond Lint  REFERRING PHYSICIAN:  Dr. 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 She used Radioplex gel during her course of therapy. She had no areas of moist desquamation by completion of therapy.                         PLAN: Routine followup in one month. Patient instructed to call if questions or worsening complaints in interim.

## 2013-06-22 ENCOUNTER — Other Ambulatory Visit (HOSPITAL_BASED_OUTPATIENT_CLINIC_OR_DEPARTMENT_OTHER): Payer: BC Managed Care – PPO | Admitting: Lab

## 2013-06-22 ENCOUNTER — Encounter: Payer: Self-pay | Admitting: Oncology

## 2013-06-22 ENCOUNTER — Ambulatory Visit (HOSPITAL_BASED_OUTPATIENT_CLINIC_OR_DEPARTMENT_OTHER): Payer: BC Managed Care – PPO

## 2013-06-22 ENCOUNTER — Encounter: Payer: Self-pay | Admitting: Adult Health

## 2013-06-22 ENCOUNTER — Ambulatory Visit (HOSPITAL_BASED_OUTPATIENT_CLINIC_OR_DEPARTMENT_OTHER): Payer: BC Managed Care – PPO | Admitting: Adult Health

## 2013-06-22 ENCOUNTER — Ambulatory Visit: Payer: BC Managed Care – PPO

## 2013-06-22 ENCOUNTER — Other Ambulatory Visit: Payer: Self-pay | Admitting: Oncology

## 2013-06-22 ENCOUNTER — Telehealth: Payer: Self-pay | Admitting: *Deleted

## 2013-06-22 VITALS — BP 108/72 | HR 76 | Temp 98.3°F | Resp 20 | Ht 65.0 in | Wt 235.5 lb

## 2013-06-22 DIAGNOSIS — C50411 Malignant neoplasm of upper-outer quadrant of right female breast: Secondary | ICD-10-CM

## 2013-06-22 DIAGNOSIS — Z5112 Encounter for antineoplastic immunotherapy: Secondary | ICD-10-CM

## 2013-06-22 DIAGNOSIS — C50919 Malignant neoplasm of unspecified site of unspecified female breast: Secondary | ICD-10-CM

## 2013-06-22 DIAGNOSIS — G589 Mononeuropathy, unspecified: Secondary | ICD-10-CM

## 2013-06-22 DIAGNOSIS — Z171 Estrogen receptor negative status [ER-]: Secondary | ICD-10-CM

## 2013-06-22 LAB — CBC WITH DIFFERENTIAL/PLATELET
Basophils Absolute: 0.1 10*3/uL (ref 0.0–0.1)
Eosinophils Absolute: 0.2 10*3/uL (ref 0.0–0.5)
HCT: 36.5 % (ref 34.8–46.6)
HGB: 11.6 g/dL (ref 11.6–15.9)
MCH: 26.2 pg (ref 25.1–34.0)
MONO#: 0.4 10*3/uL (ref 0.1–0.9)
NEUT#: 2.6 10*3/uL (ref 1.5–6.5)
NEUT%: 54.1 % (ref 38.4–76.8)
RDW: 12.6 % (ref 11.2–14.5)
lymph#: 1.5 10*3/uL (ref 0.9–3.3)

## 2013-06-22 LAB — COMPREHENSIVE METABOLIC PANEL (CC13)
AST: 16 U/L (ref 5–34)
Albumin: 4 g/dL (ref 3.5–5.0)
BUN: 9.4 mg/dL (ref 7.0–26.0)
CO2: 24 mEq/L (ref 22–29)
Calcium: 9.5 mg/dL (ref 8.4–10.4)
Chloride: 108 mEq/L (ref 98–109)
Creatinine: 0.6 mg/dL (ref 0.6–1.1)
Glucose: 103 mg/dl (ref 70–140)
Potassium: 4.3 mEq/L (ref 3.5–5.1)

## 2013-06-22 MED ORDER — SODIUM CHLORIDE 0.9 % IJ SOLN
10.0000 mL | INTRAMUSCULAR | Status: DC | PRN
  Administered 2013-06-22: 10 mL
  Filled 2013-06-22: qty 10

## 2013-06-22 MED ORDER — ACETAMINOPHEN 325 MG PO TABS
650.0000 mg | ORAL_TABLET | Freq: Once | ORAL | Status: AC
  Administered 2013-06-22: 650 mg via ORAL

## 2013-06-22 MED ORDER — HEPARIN SOD (PORK) LOCK FLUSH 100 UNIT/ML IV SOLN
500.0000 [IU] | Freq: Once | INTRAVENOUS | Status: AC | PRN
  Administered 2013-06-22: 500 [IU]
  Filled 2013-06-22: qty 5

## 2013-06-22 MED ORDER — SODIUM CHLORIDE 0.9 % IV SOLN
6.0000 mg/kg | Freq: Once | INTRAVENOUS | Status: AC
  Administered 2013-06-22: 630 mg via INTRAVENOUS
  Filled 2013-06-22: qty 30

## 2013-06-22 MED ORDER — SODIUM CHLORIDE 0.9 % IV SOLN
Freq: Once | INTRAVENOUS | Status: AC
  Administered 2013-06-22: 10:00:00 via INTRAVENOUS

## 2013-06-22 NOTE — Telephone Encounter (Signed)
Per staff message and POF I have scheduled appts.  JMW  

## 2013-06-22 NOTE — Progress Notes (Signed)
OFFICE PROGRESS NOTE  CC  DOOLITTLE, Harrel Lemon, MD [address] [place] [address] Dr. Almond Lint Dr. Lurline Hare  DIAGNOSIS: 52 year old female with invasive ductal carcinoma of the right breast diagnosed December 2013.  PRIOR THERAPY:  #1 patient was originally seen in the multidisciplinary breast clinic on 12/17/2012 with a screening mammogram that showed an abnormality in the right breast. This abnormality measured 1.6 cm. She had a biopsy performed that showed invasive ductal carcinoma grade 2, ER negative, PR negative, HER-2 positive with a Ki-67 of 80%.  #2 patient is now status post right lumpectomy on 12/31/2012 with the pathology revealing a 8 cm invasive ductal carcinoma grade 3 with lymphovascular invasion, ER negative PR negative HER-2/neu positive with amplification of 4.36, 2 sentinel nodes were negative for metastatic disease. Patient did have positive margins and she will need a reexcision which will be performed on 01/14/2013. Patient has a Port-A-Cath in place.  #3 patient will proceed with adjuvant chemotherapy initially consisting of Taxotere carboplatinum and Herceptin. With Taxotere carboplatinum to be given every 21 days and Herceptin on a weekly basis for a total of 6 cycles of TC.we will plan on starting her first cycle of chemotherapy on 01/26/2013 - 04/20/13. She stopped Taxotere carboplatin after 4 cycles, it was discontinued due to worsening neuropathy.    #4 Radiation therapy starting 05/13/13 through 06/18/13.    #5 Adjuvant Herceptin every 3 weeks starting 05/11/13.    CURRENT THERAPY: Herceptin every 3 weeks  INTERVAL HISTORY: Julia Zuniga 52 y.o. female returns for followup today. She continues to have neuropathy and is taking Cymbalta daily and Neurontin BID. She completed radiation therapy on 06/18/13.  She also had an appointment with Dr. Gala Romney on 06/18/13 and her repeat echo was normal.  She did get some peeling of the skin from her  radiation therapy.  Otherwise, she denies fever, chills, nausea, vomiting, pain, swelling, orthopnea, DOE, PND, or any other concerns.  A 10 point ROS is otherwise negative.   MEDICAL HISTORY: Past Medical History  Diagnosis Date  . Hyperlipidemia   . Diverticulitis   . Hernia, hiatal   . Heartburn   . Anxiety   . Hot flashes   . PONV (postoperative nausea and vomiting)   . Breast cancer 12/08/12    Right    ALLERGIES:  is allergic to chlorhexidine; codeine; medralone; and oxycodone-acetaminophen.  MEDICATIONS:  Current Outpatient Prescriptions  Medication Sig Dispense Refill  . atorvastatin (LIPITOR) 40 MG tablet Take 40 mg by mouth every evening.      . B Complex-C (SUPER B COMPLEX PO) Take 1 tablet by mouth daily.      . carvedilol (COREG) 6.25 MG tablet Take 1 tablet (6.25 mg total) by mouth 2 (two) times daily with a meal.  60 tablet  6  . DULoxetine (CYMBALTA) 20 MG capsule Take 1 capsule (20 mg total) by mouth daily.  30 capsule  4  . emollient (BIAFINE) cream Apply 1 % topically 2 (two) times daily.      Marland Kitchen gabapentin (NEURONTIN) 100 MG capsule Take 2 capsules three times a day  180 capsule  6  . HYDROcodone-acetaminophen (NORCO) 5-325 MG per tablet Take 1-2 tablets by mouth every 6 (six) hours as needed for pain.  30 tablet  1  . ibuprofen (ADVIL,MOTRIN) 200 MG tablet Take 400 mg by mouth every 6 (six) hours as needed. Pain      . non-metallic deodorant (ALRA) MISC Apply 1 application topically daily  as needed.      Marland Kitchen omeprazole (PRILOSEC) 40 MG capsule Take 1 capsule (40 mg total) by mouth daily.  90 capsule  2  . Wound Cleansers (RADIAPLEX EX) Apply topically.       No current facility-administered medications for this visit.    SURGICAL HISTORY:  Past Surgical History  Procedure Laterality Date  . Cesarean section      x 3  . Tubal ligation    . Tonsillectomy and adenoidectomy    . Cholecystectomy  2001    lap choli  . Breast lumpectomy with sentinel lymph node  biopsy  12/31/2012    Procedure: BREAST LUMPECTOMY WITH SENTINEL LYMPH NODE BX;  Surgeon: Almond Lint, MD;  Location: [facility];  Service: General;  Laterality: Right;  . Portacath placement  12/31/2012    Procedure: INSERTION PORT-A-CATH;  Surgeon: Almond Lint, MD;  Location:  SURGERY CENTER;  Service: General;  Laterality: Left;  Left Subclavian Vein  . Re-excision of breast cancer,superior margins  01/14/2013    Procedure: RE-EXCISION OF BREAST CANCER,SUPERIOR MARGINS;  Surgeon: Almond Lint, MD;  Location: [facility];  Service: General;  Laterality: Right;  right breast re excision for markers     REVIEW OF SYSTEMS:   General: fatigue (-), night sweats (-), fever (-), pain (-) Lymph: palpable nodes (-) HEENT: vision changes (-), mucositis (-), gum bleeding (-), epistaxis (-) Cardiovascular: chest pain (-), palpitations (-) Pulmonary: shortness of breath (-), dyspnea on exertion (-), cough (-), hemoptysis (-) GI:  Early satiety (-), melena (-), dysphagia (-), nausea/vomiting (-), diarrhea (-) GU: dysuria (-), hematuria (-), incontinence (-) Musculoskeletal: joint swelling (-), joint pain (-), back pain (-) Neuro: weakness (-), numbness (+), headache (-), confusion (-) Skin: Rash (-), lesions (-), dryness (-) Psych: depression (-), suicidal/homicidal ideation (-), feeling of hopelessness (-)  PHYSICAL EXAMINATION: Blood pressure 108/72, pulse 76, temperature 98.3 F (36.8 C), temperature source Oral, resp. rate 20, height 5\' 5"  (1.651 m), weight 235 lb 8 oz (106.822 kg), last menstrual period 01/07/2009. Body mass index is 39.19 kg/(m^2). General: Patient is a well appearing female in no acute distress HEENT: PERRLA, sclerae anicteric no conjunctival pallor, MMM,  Neck: supple, no palpable adenopathy Lungs: clear to auscultation bilaterally, no wheezes, rhonchi, or rales Cardiovascular: regular rate rhythm, S1, S2, no murmurs, rubs or gallops Abdomen: Soft,  non-tender, non-distended, normoactive bowel sounds, no HSM Extremities: warm and well perfused, no clubbing, cyanosis, or edema Skin: No rashes or lesions Neuro: Non-focal ECOG PERFORMANCE STATUS: 0 - Asymptomatic Right breast lumpectomy scar healed, no nodularity, breast erythematous, minimal peeling, axilla erythematous. left breast, no masses or nipple discharge   LABORATORY DATA: Lab Results  Component Value Date   WBC 4.8 06/22/2013   HGB 11.6 06/22/2013   HCT 36.5 06/22/2013   MCV 82.6 06/22/2013   PLT 153 06/22/2013      Chemistry      Component Value Date/Time   NA 141 06/01/2013 1045   NA 135 02/24/2013 1857   K 4.2 06/01/2013 1045   K 3.4* 02/24/2013 1857   CL 106 06/01/2013 1045   CL 97 02/24/2013 1857   CO2 25 06/01/2013 1045   CO2 29 02/24/2013 1857   BUN 9.0 06/01/2013 1045   BUN 6 02/24/2013 1857   CREATININE 0.6 06/01/2013 1045   CREATININE 0.55 02/24/2013 1857      Component Value Date/Time   CALCIUM 9.7 06/01/2013 1045   CALCIUM 9.2 02/24/2013 1857  ALKPHOS 110 06/01/2013 1045   ALKPHOS 113 02/24/2013 1857   AST 16 06/01/2013 1045   AST 18 02/24/2013 1857   ALT 26 06/01/2013 1045   ALT 30 02/24/2013 1857   BILITOT 0.36 06/01/2013 1045   BILITOT 0.2* 02/24/2013 1857    ADDITIONAL INFORMATION: 1. PROGNOSTIC INDICATORS - ACIS Results IMMUNOHISTOCHEMICAL AND MORPHOMETRIC ANALYSIS BY THE AUTOMATED CELLULAR IMAGING SYSTEM (ACIS) Estrogen Receptor (Negative, <1%): 0%, NEGATIVE Progesterone Receptor (Negative, <1%): 0%, NEGATIVE COMMENT: The negative hormone receptor study(ies) in this case have an internal positive control. All controls stained appropriately Pecola Leisure MD Pathologist, Electronic Signature ( Signed 01/07/2013) 1. CHROMOGENIC IN-SITU HYBRIDIZATION Interpretation: HER2/NEU BY CISH - SHOWS AMPLIFICATION BY CISH ANALYSIS. THE RATIO OF HER2: CEP 17 SIGNALS WAS 4.36. Reference range: Ratio: HER2:CEP17 < 1.8 gene amplification not observed Ratio: HER2:CEP  17 1.8-2.2 - equivocal result Ratio: HER2:CEP17 > 2.2 - gene amplification observed 1 of 4 FINAL for MYIESHA, EDGAR ([id number]) ADDITIONAL INFORMATION:(continued) Pecola Leisure MD Pathologist, Electronic Signature ( Signed 01/07/2013) FINAL DIAGNOSIS Diagnosis 1. Breast, lumpectomy, Right - INVASIVE DUCTAL CARCINOMA, GRADE III (1.8 CM), SEE COMMENT. - LYMPHOVASCULAR INVASION IDENTIFIED. - INVASIVE TUMOR IS 1 MM TO NEAREST MARGIN (POSTERIOR). - DUCTAL CARCINOMA IN SITU, GRADE III WITH COMEDO NECROSIS. - IN SITU CARCINOMA IS LESS THAN 0.1 MM FROM ANTERIOR-INFERIOR AND POSTERIOR MARGINS. - SEE TUMOR TEMPLATE BELOW. 2. Lymph node, sentinel, biopsy, Right axillary - ONE LYMPH NODE, NEGATIVE FOR TUMOR (0/1), SEE COMMENT. 3. Lymph node, sentinel, biopsy, Right axillary - ONE LYMPH NODE, NEGATIVE FOR TUMOR (0/1), SEE COMMENT. Microscopic Comment 1. BREAST, INVASIVE TUMOR, WITH LYMPH NODE SAMPLING Specimen, including laterality: Right breast Procedure: Lumpectomy Grade: III of III Tubule formation: II Nuclear pleomorphism: III Mitotic:III Tumor size (gross measurement): 1.8 cm Margins: Invasive, distance to closest margin: 0.6 cm In-situ, distance to closest margin: less than 0.1 cm If margin positive, focally or broadly: N/A Lymphovascular invasion: Present Ductal carcinoma in situ: Present Grade: III of III Extensive intraductal component: Absent Lobular neoplasia: Absent Tumor focality: Unifocal Treatment effect: None If present, treatment effect in breast tissue, lymph nodes or both: N/A Extent of tumor: Skin: N/A Nipple: N/A Skeletal muscle: N/A Lymph nodes: # examined: 2 Lymph nodes with metastasis: 0 Breast prognostic profile: Estrogen receptor: Repeated, previous study demonstrated 0% positivity ([id number]) 2 of 4 FINAL for JENNIFR, GAETA ([id number]) Microscopic Comment(continued) Progesterone receptor: Repeated, previous study demonstrated 0% positivity  ([id number]) Her 2 neu: Repeated, previous study demonstrated amplification (3.88) ([id number]) Ki-67: Not repeated, previous study demonstrated 83% proliferation rate ([id number]) Non-neoplastic breast: Previous biopsy site, fibrocystic change and microcalcifications TNM: pT1c pN0 pMX   RADIOGRAPHIC STUDIES:  Mr Breast Bilateral W Wo Contrast  12/23/2012  *RADIOLOGY REPORT*  Clinical Data: recent diagnosis of invasive ductal carcinoma 12 o'clock right breast, for treatment planning  BILATERAL BREAST MRI WITH AND WITHOUT CONTRAST  Technique: Multiplanar, multisequence MR images of both breasts were obtained prior to and following the intravenous administration of 20ml of multihance.  Three dimensional images were evaluated at the independent DynaCad workstation.  Comparison:  mammographic and ultrasound images 12/08/12 and 11/20/12  Findings: There is mild background parenchymal enhancement.  The left breast is negative. There is no evidence of axillary or internal mammary adenopathy.  There is no abnormal skin or nipple enhancement.  On the right, associated with the recently placed biopsy marker clip, there is an enhancing spiculated mass in the 12 o'clock position 11cm from the nipple.  The mass measures 23 x  12mm transverse dimension by 35mm craniocaudal dimension.  1cm anterior and slightly inferior to the mass is a second 7mm mass which is connected to the dominant mass by a thin bridge of enhancement. There are no other abnormalities in the right breast.  IMPRESSION: Biopsy-proven invasive carcinoma in the 12 o'clock position of the right breast, with an additional satellite focus as described above.  BI-RADS CATEGORY 6:  Known biopsy-proven malignancy - appropriate action should be taken.  THREE-DIMENSIONAL MR IMAGE RENDERING ON INDEPENDENT WORKSTATION:  Three-dimensional MR images were rendered by post-processing of the original MR data on an independent workstation.  The three- dimensional MR  images were interpreted, and findings were reported in the accompanying complete MRI report for this study.   Original Report Authenticated By: Esperanza Heir, M.D.    Nm Sentinel Node Inj-no Rpt (breast)  12/31/2012  CLINICAL DATA: right breast cancer   Sulfur colloid was injected intradermally by the nuclear medicine  technologist for breast cancer sentinel node localization.     Dg Chest Port 1 View  12/31/2012  *RADIOLOGY REPORT*  Clinical Data: Port-A-Cath placement.  History of breast cancer.  PORTABLE CHEST - 1 VIEW  Comparison: 03/26/2011.  Findings: Central line has been placed from the left.  The tip is at the level of the distal superior vena cava/caval atrial junction.  No gross pneumothorax.  Consolidation medial aspect of the lung bases may be present possibly representing atelectasis or infiltrate.  Follow-up two- view chest with better inspiration recommended for further delineation.  Heart size top normal.  Mild central pulmonary vascular prominence.  Surgical clips project over the right lower chest and may be related to breast reconstruction/axillary lymph node dissection.  IMPRESSION: Central line has been placed from the left.  The tip is at the level of the distal superior vena cava/caval atrial junction.  No gross pneumothorax.  Consolidation medial aspect of the lung bases may be present possibly representing atelectasis or infiltrate.  Follow-up two- view chest with better inspiration recommended for further delineation.   Original Report Authenticated By: Lacy Duverney, M.D.    Dg Fluoro Guide Cv Line-no Report  12/31/2012  CLINICAL DATA: portacath   FLOURO GUIDE CV LINE  Fluoroscopy was utilized by the requesting physician.  No radiographic  interpretation.      ASSESSMENT: 52 year old female with  #1  diagnosis of invasive ductal carcinoma of the right breast she is now status post lumpectomy with sentinel lymph node biopsy with the final pathology revealing a 1.8 cm invasive  ductal carcinoma grade 3 ER negative PR negative HER-2/neu amplified at 4.36. 2 sentinel nodes were negative for metastatic disease. There was noted to be lymphovascular invasion. Postoperatively patient is doing well. She went for a reexcision for positive margin.  #2 subsequently begun on adjuvant chemotherapy consisting of Taxotere carboplatinum and Herceptin starting 01/26/2013 - 04/20/13. She only received 4 cycles of Taxotere and carboplatinum but she will continue Herceptin every three weeks to finish out a full year. She has also underwent adjuvant radiation therapy beginning 05/23/13 and completed on 06/18/13.  #3 reflux which she has a chronic history.  #4 neuropathy  Patient will continue NeurontinBID. She is also on Cymbalta 20 mg daily.  PLAN:  #1 patient will proceed with Herceptin today.  She will continue Neurontin BID and Cymbalta for the neuropathy.   She was seen by Dr. Gala Romney on 06/18/13 and was cleared to continue Herceptin therapy.  #2 She has completed radiation therapy. 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    #3 she will return in three week's time for followup and Herceptin.    All questions were answered. The patient knows to call the clinic with any problems, questions or concerns. We can certainly see the patient much sooner if necessary.  I spent 25 minutes counseling the patient face to face. The total time spent in the appointment was 30 minutes.  Cherie Ouch Lyn Hollingshead, NP Medical Oncology [facility] Phone: [phone]

## 2013-06-22 NOTE — Patient Instructions (Signed)
Doing well.  Proceed with Herceptin.  Please call us if you have any questions or concerns.

## 2013-06-22 NOTE — Patient Instructions (Signed)

## 2013-06-22 NOTE — Telephone Encounter (Signed)
appts made and printed. Pt is aware that tx will follow after her appts. i emailed MW to add the tx...td

## 2013-06-23 ENCOUNTER — Ambulatory Visit: Payer: BC Managed Care – PPO

## 2013-06-24 ENCOUNTER — Ambulatory Visit: Payer: BC Managed Care – PPO

## 2013-06-25 ENCOUNTER — Ambulatory Visit: Payer: BC Managed Care – PPO

## 2013-06-26 ENCOUNTER — Ambulatory Visit: Payer: BC Managed Care – PPO

## 2013-06-29 ENCOUNTER — Ambulatory Visit: Payer: BC Managed Care – PPO

## 2013-07-13 ENCOUNTER — Ambulatory Visit (HOSPITAL_BASED_OUTPATIENT_CLINIC_OR_DEPARTMENT_OTHER): Payer: BC Managed Care – PPO | Admitting: Adult Health

## 2013-07-13 ENCOUNTER — Encounter: Payer: Self-pay | Admitting: Adult Health

## 2013-07-13 ENCOUNTER — Ambulatory Visit (HOSPITAL_BASED_OUTPATIENT_CLINIC_OR_DEPARTMENT_OTHER): Payer: BC Managed Care – PPO

## 2013-07-13 ENCOUNTER — Other Ambulatory Visit (HOSPITAL_BASED_OUTPATIENT_CLINIC_OR_DEPARTMENT_OTHER): Payer: BC Managed Care – PPO | Admitting: Lab

## 2013-07-13 VITALS — BP 123/80 | HR 81 | Temp 98.5°F | Resp 20 | Ht 65.0 in | Wt 235.4 lb

## 2013-07-13 DIAGNOSIS — C50411 Malignant neoplasm of upper-outer quadrant of right female breast: Secondary | ICD-10-CM

## 2013-07-13 DIAGNOSIS — K219 Gastro-esophageal reflux disease without esophagitis: Secondary | ICD-10-CM

## 2013-07-13 DIAGNOSIS — C50919 Malignant neoplasm of unspecified site of unspecified female breast: Secondary | ICD-10-CM

## 2013-07-13 DIAGNOSIS — Z5112 Encounter for antineoplastic immunotherapy: Secondary | ICD-10-CM

## 2013-07-13 DIAGNOSIS — G589 Mononeuropathy, unspecified: Secondary | ICD-10-CM

## 2013-07-13 DIAGNOSIS — Z171 Estrogen receptor negative status [ER-]: Secondary | ICD-10-CM

## 2013-07-13 LAB — CBC WITH DIFFERENTIAL/PLATELET
BASO%: 1 % (ref 0.0–2.0)
Basophils Absolute: 0.1 10*3/uL (ref 0.0–0.1)
EOS%: 2.9 % (ref 0.0–7.0)
HCT: 37.3 % (ref 34.8–46.6)
HGB: 11.8 g/dL (ref 11.6–15.9)
MCH: 25.5 pg (ref 25.1–34.0)
MONO#: 0.4 10*3/uL (ref 0.1–0.9)
NEUT%: 56.7 % (ref 38.4–76.8)
RDW: 12.6 % (ref 11.2–14.5)
WBC: 5.9 10*3/uL (ref 3.9–10.3)
lymph#: 1.9 10*3/uL (ref 0.9–3.3)

## 2013-07-13 LAB — COMPREHENSIVE METABOLIC PANEL (CC13)
AST: 17 U/L (ref 5–34)
Albumin: 4.1 g/dL (ref 3.5–5.0)
Alkaline Phosphatase: 136 U/L (ref 40–150)
Calcium: 10 mg/dL (ref 8.4–10.4)
Chloride: 106 mEq/L (ref 98–109)
Glucose: 89 mg/dl (ref 70–140)
Potassium: 4.4 mEq/L (ref 3.5–5.1)
Sodium: 143 mEq/L (ref 136–145)
Total Protein: 7.6 g/dL (ref 6.4–8.3)

## 2013-07-13 MED ORDER — SODIUM CHLORIDE 0.9 % IJ SOLN
10.0000 mL | INTRAMUSCULAR | Status: DC | PRN
  Administered 2013-07-13: 10 mL
  Filled 2013-07-13: qty 10

## 2013-07-13 MED ORDER — TRASTUZUMAB CHEMO INJECTION 440 MG
6.0000 mg/kg | Freq: Once | INTRAVENOUS | Status: AC
  Administered 2013-07-13: 630 mg via INTRAVENOUS
  Filled 2013-07-13: qty 30

## 2013-07-13 MED ORDER — HEPARIN SOD (PORK) LOCK FLUSH 100 UNIT/ML IV SOLN
500.0000 [IU] | Freq: Once | INTRAVENOUS | Status: AC | PRN
  Administered 2013-07-13: 500 [IU]
  Filled 2013-07-13: qty 5

## 2013-07-13 MED ORDER — ACETAMINOPHEN 325 MG PO TABS
650.0000 mg | ORAL_TABLET | Freq: Once | ORAL | Status: AC
  Administered 2013-07-13: 650 mg via ORAL

## 2013-07-13 MED ORDER — SODIUM CHLORIDE 0.9 % IV SOLN
Freq: Once | INTRAVENOUS | Status: AC
  Administered 2013-07-13: 10:00:00 via INTRAVENOUS

## 2013-07-13 NOTE — Patient Instructions (Addendum)
[facility] Discharge Instructions for Patients Receiving Chemotherapy  Today you received the following chemotherapy agents Herceptin.  To help prevent nausea and vomiting after your treatment, we encourage you to take your nausea medication as prescribed.   If you develop nausea and vomiting that is not controlled by your nausea medication, call the clinic.   BELOW ARE SYMPTOMS THAT SHOULD BE REPORTED IMMEDIATELY:  *FEVER GREATER THAN 100.5 F  *CHILLS WITH OR WITHOUT FEVER  NAUSEA AND VOMITING THAT IS NOT CONTROLLED WITH YOUR NAUSEA MEDICATION  *UNUSUAL SHORTNESS OF BREATH  *UNUSUAL BRUISING OR BLEEDING  TENDERNESS IN MOUTH AND THROAT WITH OR WITHOUT PRESENCE OF ULCERS  *URINARY PROBLEMS  *BOWEL PROBLEMS  UNUSUAL RASH Items with * indicate a potential emergency and should be followed up as soon as possible.  Feel free to call the clinic you have any questions or concerns. The clinic phone number is [phone].    

## 2013-07-13 NOTE — Progress Notes (Signed)
OFFICE PROGRESS NOTE  CC  DOOLITTLE, Harrel Lemon, MD [address] [place] [address] Dr. Almond Lint Dr. Lurline Hare  DIAGNOSIS: 52 year old female with invasive ductal carcinoma of the right breast diagnosed December 2013.  PRIOR THERAPY:  #1 patient was originally seen in the multidisciplinary breast clinic on 12/17/2012 with a screening mammogram that showed an abnormality in the right breast. This abnormality measured 1.6 cm. She had a biopsy performed that showed invasive ductal carcinoma grade 2, ER negative, PR negative, HER-2 positive with a Ki-67 of 80%.  #2 patient is now status post right lumpectomy on 12/31/2012 with the pathology revealing a 8 cm invasive ductal carcinoma grade 3 with lymphovascular invasion, ER negative PR negative HER-2/neu positive with amplification of 4.36, 2 sentinel nodes were negative for metastatic disease. Patient did have positive margins and she will need a reexcision which will be performed on 01/14/2013. Patient has a Port-A-Cath in place.  #3 patient will proceed with adjuvant chemotherapy initially consisting of Taxotere carboplatinum and Herceptin. With Taxotere carboplatinum to be given every 21 days and Herceptin on a weekly basis for a total of 6 cycles of TC.we will plan on starting her first cycle of chemotherapy on 01/26/2013 - 04/20/13. She stopped Taxotere carboplatin after 4 cycles, it was discontinued due to worsening neuropathy.    #4 Radiation therapy starting 05/13/13 through 06/18/13.    #5 Adjuvant Herceptin every 3 weeks starting 05/11/13.    CURRENT THERAPY: Herceptin every 3 weeks  INTERVAL HISTORY: Julia Zuniga 52 y.o. female returns for followup today. Her numbness is improving in the fingertips.  It does remain in her feet.  She continues on Neurontin and Cymbalta.  She does endorse joint aches and pains since completing radiation.   She denies fevers, chills, night sweats, unintentional weight loss, orthopnea, DOE,  chest pain, palpitations, PND or any other concerns.  Other than the generalized aches a 10 point ROS is neg.   MEDICAL HISTORY: Past Medical History  Diagnosis Date  . Hyperlipidemia   . Diverticulitis   . Hernia, hiatal   . Heartburn   . Anxiety   . Hot flashes   . PONV (postoperative nausea and vomiting)   . Breast cancer 12/08/12    Right    ALLERGIES:  is allergic to chlorhexidine; codeine; medralone; and oxycodone-acetaminophen.  MEDICATIONS:  Current Outpatient Prescriptions  Medication Sig Dispense Refill  . atorvastatin (LIPITOR) 40 MG tablet Take 40 mg by mouth every evening.      . B Complex-C (SUPER B COMPLEX PO) Take 1 tablet by mouth daily.      . carvedilol (COREG) 6.25 MG tablet Take 1 tablet (6.25 mg total) by mouth 2 (two) times daily with a meal.  60 tablet  6  . DULoxetine (CYMBALTA) 20 MG capsule Take 1 capsule (20 mg total) by mouth daily.  30 capsule  4  . emollient (BIAFINE) cream Apply 1 % topically 2 (two) times daily.      Marland Kitchen gabapentin (NEURONTIN) 100 MG capsule Take 2 capsules three times a day  180 capsule  6  . HYDROcodone-acetaminophen (NORCO) 5-325 MG per tablet Take 1-2 tablets by mouth every 6 (six) hours as needed for pain.  30 tablet  1  . ibuprofen (ADVIL,MOTRIN) 200 MG tablet Take 400 mg by mouth every 6 (six) hours as needed. Pain      . non-metallic deodorant (ALRA) MISC Apply 1 application topically daily as needed.      Marland Kitchen omeprazole (  PRILOSEC) 40 MG capsule Take 1 capsule (40 mg total) by mouth daily.  90 capsule  2  . Wound Cleansers (RADIAPLEX EX) Apply topically.       No current facility-administered medications for this visit.    SURGICAL HISTORY:  Past Surgical History  Procedure Laterality Date  . Cesarean section      x 3  . Tubal ligation    . Tonsillectomy and adenoidectomy    . Cholecystectomy  2001    lap choli  . Breast lumpectomy with sentinel lymph node biopsy  12/31/2012    Procedure: BREAST LUMPECTOMY WITH  SENTINEL LYMPH NODE BX;  Surgeon: Almond Lint, MD;  Location: [facility];  Service: General;  Laterality: Right;  . Portacath placement  12/31/2012    Procedure: INSERTION PORT-A-CATH;  Surgeon: Almond Lint, MD;  Location: [facility];  Service: General;  Laterality: Left;  Left Subclavian Vein  . Re-excision of breast cancer,superior margins  01/14/2013    Procedure: RE-EXCISION OF BREAST CANCER,SUPERIOR MARGINS;  Surgeon: Almond Lint, MD;  Location: [facility];  Service: General;  Laterality: Right;  right breast re excision for markers     REVIEW OF SYSTEMS:   General: fatigue (-), night sweats (-), fever (-), pain (-) Lymph: palpable nodes (-) HEENT: vision changes (-), mucositis (-), gum bleeding (-), epistaxis (-) Cardiovascular: chest pain (-), palpitations (-) Pulmonary: shortness of breath (-), dyspnea on exertion (-), cough (-), hemoptysis (-) GI:  Early satiety (-), melena (-), dysphagia (-), nausea/vomiting (-), diarrhea (-) GU: dysuria (-), hematuria (-), incontinence (-) Musculoskeletal: joint swelling (-), joint pain (-), back pain (-) Neuro: weakness (-), numbness (+), headache (-), confusion (-) Skin: Rash (-), lesions (-), dryness (-) Psych: depression (-), suicidal/homicidal ideation (-), feeling of hopelessness (-)  PHYSICAL EXAMINATION: Blood pressure 123/80, pulse 81, temperature 98.5 F (36.9 C), temperature source Oral, resp. rate 20, height 5\' 5"  (1.651 m), weight 235 lb 6.4 oz (106.777 kg), last menstrual period 01/07/2009. Body mass index is 39.17 kg/(m^2). General: Patient is a well appearing female in no acute distress HEENT: PERRLA, sclerae anicteric no conjunctival pallor, MMM,  Neck: supple, no palpable adenopathy Lungs: clear to auscultation bilaterally, no wheezes, rhonchi, or rales Cardiovascular: regular rate rhythm, S1, S2, no murmurs, rubs or gallops Abdomen: Soft, non-tender, non-distended, normoactive bowel sounds, no  HSM Extremities: warm and well perfused, no clubbing, cyanosis, or edema Skin: No rashes or lesions Neuro: Non-focal ECOG PERFORMANCE STATUS: 0 - Asymptomatic Right breast lumpectomy scar healed, no nodularity, breast erythematous, minimal peeling, axilla erythematous. left breast, no masses or nipple discharge   LABORATORY DATA: Lab Results  Component Value Date   WBC 5.9 07/13/2013   HGB 11.8 07/13/2013   HCT 37.3 07/13/2013   MCV 80.7 07/13/2013   PLT 179 07/13/2013      Chemistry      Component Value Date/Time   NA 141 06/22/2013 0818   NA 135 02/24/2013 1857   K 4.3 06/22/2013 0818   K 3.4* 02/24/2013 1857   CL 106 06/01/2013 1045   CL 97 02/24/2013 1857   CO2 24 06/22/2013 0818   CO2 29 02/24/2013 1857   BUN 9.4 06/22/2013 0818   BUN 6 02/24/2013 1857   CREATININE 0.6 06/22/2013 0818   CREATININE 0.55 02/24/2013 1857      Component Value Date/Time   CALCIUM 9.5 06/22/2013 0818   CALCIUM 9.2 02/24/2013 1857   ALKPHOS 125 06/22/2013 0818   ALKPHOS 113 02/24/2013  1857   AST 16 06/22/2013 0818   AST 18 02/24/2013 1857   ALT 28 06/22/2013 0818   ALT 30 02/24/2013 1857   BILITOT 0.26 06/22/2013 0818   BILITOT 0.2* 02/24/2013 1857    ADDITIONAL INFORMATION: 1. PROGNOSTIC INDICATORS - ACIS Results IMMUNOHISTOCHEMICAL AND MORPHOMETRIC ANALYSIS BY THE AUTOMATED CELLULAR IMAGING SYSTEM (ACIS) Estrogen Receptor (Negative, <1%): 0%, NEGATIVE Progesterone Receptor (Negative, <1%): 0%, NEGATIVE COMMENT: The negative hormone receptor study(ies) in this case have an internal positive control. All controls stained appropriately Pecola Leisure MD Pathologist, Electronic Signature ( Signed 01/07/2013) 1. CHROMOGENIC IN-SITU HYBRIDIZATION Interpretation: HER2/NEU BY CISH - SHOWS AMPLIFICATION BY CISH ANALYSIS. THE RATIO OF HER2: CEP 17 SIGNALS WAS 4.36. Reference range: Ratio: HER2:CEP17 < 1.8 gene amplification not observed Ratio: HER2:CEP 17 1.8-2.2 - equivocal result Ratio: HER2:CEP17 > 2.2 - gene  amplification observed 1 of 4 FINAL for Julia, Zuniga ([id number]) ADDITIONAL INFORMATION:(continued) Pecola Leisure MD Pathologist, Electronic Signature ( Signed 01/07/2013) FINAL DIAGNOSIS Diagnosis 1. Breast, lumpectomy, Right - INVASIVE DUCTAL CARCINOMA, GRADE III (1.8 CM), SEE COMMENT. - LYMPHOVASCULAR INVASION IDENTIFIED. - INVASIVE TUMOR IS 1 MM TO NEAREST MARGIN (POSTERIOR). - DUCTAL CARCINOMA IN SITU, GRADE III WITH COMEDO NECROSIS. - IN SITU CARCINOMA IS LESS THAN 0.1 MM FROM ANTERIOR-INFERIOR AND POSTERIOR MARGINS. - SEE TUMOR TEMPLATE BELOW. 2. Lymph node, sentinel, biopsy, Right axillary - ONE LYMPH NODE, NEGATIVE FOR TUMOR (0/1), SEE COMMENT. 3. Lymph node, sentinel, biopsy, Right axillary - ONE LYMPH NODE, NEGATIVE FOR TUMOR (0/1), SEE COMMENT. Microscopic Comment 1. BREAST, INVASIVE TUMOR, WITH LYMPH NODE SAMPLING Specimen, including laterality: Right breast Procedure: Lumpectomy Grade: III of III Tubule formation: II Nuclear pleomorphism: III Mitotic:III Tumor size (gross measurement): 1.8 cm Margins: Invasive, distance to closest margin: 0.6 cm In-situ, distance to closest margin: less than 0.1 cm If margin positive, focally or broadly: N/A Lymphovascular invasion: Present Ductal carcinoma in situ: Present Grade: III of III Extensive intraductal component: Absent Lobular neoplasia: Absent Tumor focality: Unifocal Treatment effect: None If present, treatment effect in breast tissue, lymph nodes or both: N/A Extent of tumor: Skin: N/A Nipple: N/A Skeletal muscle: N/A Lymph nodes: # examined: 2 Lymph nodes with metastasis: 0 Breast prognostic profile: Estrogen receptor: Repeated, previous study demonstrated 0% positivity ([id number]) 2 of 4 FINAL for Julia, Zuniga ([id number]) Microscopic Comment(continued) Progesterone receptor: Repeated, previous study demonstrated 0% positivity ([id number]) Her 2 neu: Repeated, previous study demonstrated  amplification (3.88) ([id number]) Ki-67: Not repeated, previous study demonstrated 83% proliferation rate ([id number]) Non-neoplastic breast: Previous biopsy site, fibrocystic change and microcalcifications TNM: pT1c pN0 pMX   RADIOGRAPHIC STUDIES:  Mr Breast Bilateral W Wo Contrast  12/23/2012  *RADIOLOGY REPORT*  Clinical Data: recent diagnosis of invasive ductal carcinoma 12 o'clock right breast, for treatment planning  BILATERAL BREAST MRI WITH AND WITHOUT CONTRAST  Technique: Multiplanar, multisequence MR images of both breasts were obtained prior to and following the intravenous administration of 20ml of multihance.  Three dimensional images were evaluated at the independent DynaCad workstation.  Comparison:  mammographic and ultrasound images 12/08/12 and 11/20/12  Findings: There is mild background parenchymal enhancement.  The left breast is negative. There is no evidence of axillary or internal mammary adenopathy.  There is no abnormal skin or nipple enhancement.  On the right, associated with the recently placed biopsy marker clip, there is an enhancing spiculated mass in the 12 o'clock position 11cm from the nipple.  The mass measures 23 x 12mm transverse dimension by 35mm craniocaudal dimension.  1cm  anterior and slightly inferior to the mass is a second 7mm mass which is connected to the dominant mass by a thin bridge of enhancement. There are no other abnormalities in the right breast.  IMPRESSION: Biopsy-proven invasive carcinoma in the 12 o'clock position of the right breast, with an additional satellite focus as described above.  BI-RADS CATEGORY 6:  Known biopsy-proven malignancy - appropriate action should be taken.  THREE-DIMENSIONAL MR IMAGE RENDERING ON INDEPENDENT WORKSTATION:  Three-dimensional MR images were rendered by post-processing of the original MR data on an independent workstation.  The three- dimensional MR images were interpreted, and findings were reported in the  accompanying complete MRI report for this study.   Original Report Authenticated By: Esperanza Heir, M.D.    Nm Sentinel Node Inj-no Rpt (breast)  12/31/2012  CLINICAL DATA: right breast cancer   Sulfur colloid was injected intradermally by the nuclear medicine  technologist for breast cancer sentinel node localization.     Dg Chest Port 1 View  12/31/2012  *RADIOLOGY REPORT*  Clinical Data: Port-A-Cath placement.  History of breast cancer.  PORTABLE CHEST - 1 VIEW  Comparison: 03/26/2011.  Findings: Central line has been placed from the left.  The tip is at the level of the distal superior vena cava/caval atrial junction.  No gross pneumothorax.  Consolidation medial aspect of the lung bases may be present possibly representing atelectasis or infiltrate.  Follow-up two- view chest with better inspiration recommended for further delineation.  Heart size top normal.  Mild central pulmonary vascular prominence.  Surgical clips project over the right lower chest and may be related to breast reconstruction/axillary lymph node dissection.  IMPRESSION: Central line has been placed from the left.  The tip is at the level of the distal superior vena cava/caval atrial junction.  No gross pneumothorax.  Consolidation medial aspect of the lung bases may be present possibly representing atelectasis or infiltrate.  Follow-up two- view chest with better inspiration recommended for further delineation.   Original Report Authenticated By: Lacy Duverney, M.D.    Dg Fluoro Guide Cv Line-no Report  12/31/2012  CLINICAL DATA: portacath   FLOURO GUIDE CV LINE  Fluoroscopy was utilized by the requesting physician.  No radiographic  interpretation.      ASSESSMENT: 52 year old female with  #1  diagnosis of invasive ductal carcinoma of the right breast she is now status post lumpectomy with sentinel lymph node biopsy with the final pathology revealing a 1.8 cm invasive ductal carcinoma grade 3 ER negative PR negative HER-2/neu  amplified at 4.36. 2 sentinel nodes were negative for metastatic disease. There was noted to be lymphovascular invasion. Postoperatively patient is doing well. She went for a reexcision for positive margin.  #2 subsequently begun on adjuvant chemotherapy consisting of Taxotere carboplatinum and Herceptin starting 01/26/2013 - 04/20/13. She only received 4 cycles of Taxotere and carboplatinum but she will continue Herceptin every three weeks to finish out a full year. She has also underwent adjuvant radiation therapy beginning 05/23/13 and completed on 06/18/13.  #3 reflux which she has a chronic history.  #4 neuropathy  Patient will continue NeurontinBID. She is also on Cymbalta 20 mg daily.  PLAN:  #1 patient will proceed with Herceptin today.  She will continue Neurontin BID and Cymbalta for the neuropathy.   She was seen by Dr. Gala Romney on 06/18/13 and was cleared to continue Herceptin therapy.  #2 she will return in three week's time for followup and Herceptin.    All questions were answered.  The patient knows to call the clinic with any problems, questions or concerns. We can certainly see the patient much sooner if necessary.  I spent 15 minutes counseling the patient face to face. The total time spent in the appointment was 30 minutes.  Cherie Ouch Lyn Hollingshead, NP Medical Oncology [facility] Phone: [phone]

## 2013-07-20 ENCOUNTER — Encounter: Payer: Self-pay | Admitting: Oncology

## 2013-07-20 NOTE — Progress Notes (Signed)
The patient's insurance is paying herceptin 100%. Will shred application for asst.

## 2013-07-27 ENCOUNTER — Encounter: Payer: Self-pay | Admitting: Radiation Oncology

## 2013-07-28 ENCOUNTER — Ambulatory Visit
Admission: RE | Admit: 2013-07-28 | Payer: BC Managed Care – PPO | Source: Ambulatory Visit | Admitting: Radiation Oncology

## 2013-07-28 HISTORY — DX: Personal history of irradiation: Z92.3

## 2013-08-03 ENCOUNTER — Telehealth (HOSPITAL_COMMUNITY): Payer: Self-pay | Admitting: Cardiology

## 2013-08-03 ENCOUNTER — Ambulatory Visit (HOSPITAL_BASED_OUTPATIENT_CLINIC_OR_DEPARTMENT_OTHER): Payer: BC Managed Care – PPO | Admitting: Adult Health

## 2013-08-03 ENCOUNTER — Other Ambulatory Visit (HOSPITAL_BASED_OUTPATIENT_CLINIC_OR_DEPARTMENT_OTHER): Payer: BC Managed Care – PPO | Admitting: Lab

## 2013-08-03 ENCOUNTER — Telehealth: Payer: Self-pay | Admitting: *Deleted

## 2013-08-03 ENCOUNTER — Encounter: Payer: Self-pay | Admitting: Adult Health

## 2013-08-03 ENCOUNTER — Ambulatory Visit (HOSPITAL_BASED_OUTPATIENT_CLINIC_OR_DEPARTMENT_OTHER): Payer: BC Managed Care – PPO

## 2013-08-03 VITALS — BP 127/82 | HR 76 | Temp 98.2°F | Resp 20 | Ht 65.0 in | Wt 236.2 lb

## 2013-08-03 DIAGNOSIS — I10 Essential (primary) hypertension: Secondary | ICD-10-CM

## 2013-08-03 DIAGNOSIS — E785 Hyperlipidemia, unspecified: Secondary | ICD-10-CM

## 2013-08-03 DIAGNOSIS — C50919 Malignant neoplasm of unspecified site of unspecified female breast: Secondary | ICD-10-CM

## 2013-08-03 DIAGNOSIS — C50411 Malignant neoplasm of upper-outer quadrant of right female breast: Secondary | ICD-10-CM

## 2013-08-03 DIAGNOSIS — G589 Mononeuropathy, unspecified: Secondary | ICD-10-CM

## 2013-08-03 DIAGNOSIS — Z5112 Encounter for antineoplastic immunotherapy: Secondary | ICD-10-CM

## 2013-08-03 DIAGNOSIS — Z171 Estrogen receptor negative status [ER-]: Secondary | ICD-10-CM

## 2013-08-03 LAB — CBC WITH DIFFERENTIAL/PLATELET
BASO%: 1 % (ref 0.0–2.0)
EOS%: 2.3 % (ref 0.0–7.0)
MCH: 25.2 pg (ref 25.1–34.0)
MCV: 78.4 fL — ABNORMAL LOW (ref 79.5–101.0)
MONO%: 6.8 % (ref 0.0–14.0)
RBC: 4.64 10*6/uL (ref 3.70–5.45)
RDW: 12.8 % (ref 11.2–14.5)
lymph#: 1.8 10*3/uL (ref 0.9–3.3)
nRBC: 0 % (ref 0–0)

## 2013-08-03 LAB — COMPREHENSIVE METABOLIC PANEL (CC13)
ALT: 27 U/L (ref 0–55)
AST: 16 U/L (ref 5–34)
Alkaline Phosphatase: 144 U/L (ref 40–150)
Sodium: 142 mEq/L (ref 136–145)
Total Bilirubin: 0.46 mg/dL (ref 0.20–1.20)
Total Protein: 7.5 g/dL (ref 6.4–8.3)

## 2013-08-03 MED ORDER — HEPARIN SOD (PORK) LOCK FLUSH 100 UNIT/ML IV SOLN
500.0000 [IU] | Freq: Once | INTRAVENOUS | Status: AC | PRN
  Administered 2013-08-03: 500 [IU]
  Filled 2013-08-03: qty 5

## 2013-08-03 MED ORDER — SODIUM CHLORIDE 0.9 % IV SOLN
Freq: Once | INTRAVENOUS | Status: AC
  Administered 2013-08-03: 09:00:00 via INTRAVENOUS

## 2013-08-03 MED ORDER — DIPHENHYDRAMINE HCL 25 MG PO CAPS: 50.0000 mg | ORAL_CAPSULE | Freq: Once | ORAL | Status: DC

## 2013-08-03 MED ORDER — TRASTUZUMAB CHEMO INJECTION 440 MG
6.0000 mg/kg | Freq: Once | INTRAVENOUS | Status: AC
  Administered 2013-08-03: 630 mg via INTRAVENOUS
  Filled 2013-08-03: qty 30

## 2013-08-03 MED ORDER — CARVEDILOL 6.25 MG PO TABS: 6.2500 mg | ORAL_TABLET | Freq: Two times a day (BID) | ORAL | Status: DC

## 2013-08-03 MED ORDER — ACETAMINOPHEN 325 MG PO TABS
650.0000 mg | ORAL_TABLET | Freq: Once | ORAL | Status: AC
  Administered 2013-08-03: 650 mg via ORAL

## 2013-08-03 MED ORDER — ATORVASTATIN CALCIUM 40 MG PO TABS: 40.0000 mg | ORAL_TABLET | Freq: Every evening | ORAL | Status: DC

## 2013-08-03 MED ORDER — SODIUM CHLORIDE 0.9 % IJ SOLN
10.0000 mL | INTRAMUSCULAR | Status: DC | PRN
  Administered 2013-08-03: 10 mL
  Filled 2013-08-03: qty 10

## 2013-08-03 NOTE — Telephone Encounter (Signed)
Pt called to request refills of meds 

## 2013-08-03 NOTE — Telephone Encounter (Signed)
appts made and printed. Pt is aware that tx will follow her ov on 11/17, 12/8, and 12/29. i emailed MW to add the tx...td

## 2013-08-03 NOTE — Patient Instructions (Signed)
Doing well.  Proceed with Herceptin.  Continue Neurontin and Cymbalta.  Please call us if you have any questions or concerns.

## 2013-08-03 NOTE — Progress Notes (Signed)
OFFICE PROGRESS NOTE  CC  DOOLITTLE, Harrel Lemon, MD [address] [place] [address] Dr. Almond Lint Dr. Lurline Hare  DIAGNOSIS: 52 year old female with invasive ductal carcinoma of the right breast diagnosed December 2013.  PRIOR THERAPY:  #1 patient was originally seen in the multidisciplinary breast clinic on 12/17/2012 with a screening mammogram that showed an abnormality in the right breast. This abnormality measured 1.6 cm. She had a biopsy performed that showed invasive ductal carcinoma grade 2, ER negative, PR negative, HER-2 positive with a Ki-67 of 80%.  #2 patient is now status post right lumpectomy on 12/31/2012 with the pathology revealing a 8 cm invasive ductal carcinoma grade 3 with lymphovascular invasion, ER negative PR negative HER-2/neu positive with amplification of 4.36, 2 sentinel nodes were negative for metastatic disease. Patient did have positive margins and she will need a reexcision which will be performed on 01/14/2013. Patient has a Port-A-Cath in place.  #3 patient will proceed with adjuvant chemotherapy initially consisting of Taxotere carboplatinum and Herceptin. With Taxotere carboplatinum to be given every 21 days and Herceptin on a weekly basis for a total of 6 cycles of TC.we will plan on starting her first cycle of chemotherapy on 01/26/2013 - 04/20/13. She stopped Taxotere carboplatin after 4 cycles, it was discontinued due to worsening neuropathy.    #4 Radiation therapy starting 05/13/13 through 06/18/13.    #5 Adjuvant Herceptin every 3 weeks starting 05/11/13.    CURRENT THERAPY: Herceptin every 3 weeks  INTERVAL HISTORY: Julia Zuniga 52 y.o. female returns for followup today. She continues to have numbness in her feet.  It has improved in her hands.  She continues on Neurontin and Cymbalta for the neuropathy.  She denies fevers, chills, orthopnea, chest pain, palpitations.  She continues to take Ibuprofen for aches and pains in the morning.   Otherwise, a 10 point ROS is negative.    MEDICAL HISTORY: Past Medical History  Diagnosis Date  . Hyperlipidemia   . Diverticulitis   . Hernia, hiatal   . Heartburn   . Anxiety   . Hot flashes   . PONV (postoperative nausea and vomiting)   . Breast cancer 12/08/12    Right  . History of radiation therapy 05/13/13-06/18/13    right breast    ALLERGIES:  is allergic to chlorhexidine; codeine; medralone; and oxycodone-acetaminophen.  MEDICATIONS:  Current Outpatient Prescriptions  Medication Sig Dispense Refill  . atorvastatin (LIPITOR) 40 MG tablet Take 40 mg by mouth every evening.      . B Complex-C (SUPER B COMPLEX PO) Take 1 tablet by mouth daily.      . carvedilol (COREG) 6.25 MG tablet Take 1 tablet (6.25 mg total) by mouth 2 (two) times daily with a meal.  60 tablet  6  . DULoxetine (CYMBALTA) 20 MG capsule Take 1 capsule (20 mg total) by mouth daily.  30 capsule  4  . emollient (BIAFINE) cream Apply 1 % topically 2 (two) times daily.      Marland Kitchen gabapentin (NEURONTIN) 100 MG capsule Take 2 capsules three times a day  180 capsule  6  . HYDROcodone-acetaminophen (NORCO) 5-325 MG per tablet Take 1-2 tablets by mouth every 6 (six) hours as needed for pain.  30 tablet  1  . ibuprofen (ADVIL,MOTRIN) 200 MG tablet Take 400 mg by mouth every 6 (six) hours as needed. Pain      . non-metallic deodorant (ALRA) MISC Apply 1 application topically daily as needed.      Marland Kitchen  omeprazole (PRILOSEC) 40 MG capsule Take 1 capsule (40 mg total) by mouth daily.  90 capsule  2  . Wound Cleansers (RADIAPLEX EX) Apply topically.       No current facility-administered medications for this visit.    SURGICAL HISTORY:  Past Surgical History  Procedure Laterality Date  . Cesarean section      x 3  . Tubal ligation    . Tonsillectomy and adenoidectomy    . Cholecystectomy  2001    lap choli  . Breast lumpectomy with sentinel lymph node biopsy  12/31/2012    Procedure: BREAST LUMPECTOMY WITH SENTINEL  LYMPH NODE BX;  Surgeon: Almond Lint, MD;  Location: [facility];  Service: General;  Laterality: Right;  . Portacath placement  12/31/2012    Procedure: INSERTION PORT-A-CATH;  Surgeon: Almond Lint, MD;  Location: [facility];  Service: General;  Laterality: Left;  Left Subclavian Vein  . Re-excision of breast cancer,superior margins  01/14/2013    Procedure: RE-EXCISION OF BREAST CANCER,SUPERIOR MARGINS;  Surgeon: Almond Lint, MD;  Location: [facility];  Service: General;  Laterality: Right;  right breast re excision for markers     REVIEW OF SYSTEMS:   General: fatigue (-), night sweats (-), fever (-), pain (-) Lymph: palpable nodes (-) HEENT: vision changes (-), mucositis (-), gum bleeding (-), epistaxis (-) Cardiovascular: chest pain (-), palpitations (-) Pulmonary: shortness of breath (-), dyspnea on exertion (-), cough (-), hemoptysis (-) GI:  Early satiety (-), melena (-), dysphagia (-), nausea/vomiting (-), diarrhea (-) GU: dysuria (-), hematuria (-), incontinence (-) Musculoskeletal: joint swelling (-), joint pain (+), back pain (-) Neuro: weakness (-), numbness (+), headache (-), confusion (-) Skin: Rash (-), lesions (-), dryness (-) Psych: depression (-), suicidal/homicidal ideation (-), feeling of hopelessness (-)  PHYSICAL EXAMINATION: Blood pressure 127/82, pulse 76, temperature 98.2 F (36.8 C), temperature source Oral, resp. rate 20, height 5\' 5"  (1.651 m), weight 236 lb 3.2 oz (107.14 kg), last menstrual period 01/07/2009. Body mass index is 39.31 kg/(m^2). General: Patient is a well appearing female in no acute distress HEENT: PERRLA, sclerae anicteric no conjunctival pallor, MMM,  Neck: supple, no palpable adenopathy Lungs: clear to auscultation bilaterally, no wheezes, rhonchi, or rales Cardiovascular: regular rate rhythm, S1, S2, no murmurs, rubs or gallops Abdomen: Soft, non-tender, non-distended, normoactive bowel sounds, no  HSM Extremities: warm and well perfused, no clubbing, cyanosis, or edema Skin: No rashes or lesions Neuro: Non-focal ECOG PERFORMANCE STATUS: 0 - Asymptomatic Right breast lumpectomy scar healed, no nodularity, breast erythematous, hyperpigmentation and radiation changes present. left breast, no masses or nipple discharge   LABORATORY DATA: Lab Results  Component Value Date   WBC 6.2 08/03/2013   HGB 11.7 08/03/2013   HCT 36.4 08/03/2013   MCV 78.4* 08/03/2013   PLT 194 08/03/2013      Chemistry      Component Value Date/Time   NA 143 07/13/2013 0902   NA 135 02/24/2013 1857   K 4.4 07/13/2013 0902   K 3.4* 02/24/2013 1857   CL 106 06/01/2013 1045   CL 97 02/24/2013 1857   CO2 26 07/13/2013 0902   CO2 29 02/24/2013 1857   BUN 9.6 07/13/2013 0902   BUN 6 02/24/2013 1857   CREATININE 0.7 07/13/2013 0902   CREATININE 0.55 02/24/2013 1857      Component Value Date/Time   CALCIUM 10.0 07/13/2013 0902   CALCIUM 9.2 02/24/2013 1857   ALKPHOS 136 07/13/2013 0902   ALKPHOS  113 02/24/2013 1857   AST 17 07/13/2013 0902   AST 18 02/24/2013 1857   ALT 28 07/13/2013 0902   ALT 30 02/24/2013 1857   BILITOT 0.25 07/13/2013 0902   BILITOT 0.2* 02/24/2013 1857    ADDITIONAL INFORMATION: 1. PROGNOSTIC INDICATORS - ACIS Results IMMUNOHISTOCHEMICAL AND MORPHOMETRIC ANALYSIS BY THE AUTOMATED CELLULAR IMAGING SYSTEM (ACIS) Estrogen Receptor (Negative, <1%): 0%, NEGATIVE Progesterone Receptor (Negative, <1%): 0%, NEGATIVE COMMENT: The negative hormone receptor study(ies) in this case have an internal positive control. All controls stained appropriately Pecola Leisure MD Pathologist, Electronic Signature ( Signed 01/07/2013) 1. CHROMOGENIC IN-SITU HYBRIDIZATION Interpretation: HER2/NEU BY CISH - SHOWS AMPLIFICATION BY CISH ANALYSIS. THE RATIO OF HER2: CEP 17 SIGNALS WAS 4.36. Reference range: Ratio: HER2:CEP17 < 1.8 gene amplification not observed Ratio: HER2:CEP 17 1.8-2.2 - equivocal result Ratio: HER2:CEP17 > 2.2  - gene amplification observed 1 of 4 FINAL for CHACE, BISCH ([id number]) ADDITIONAL INFORMATION:(continued) Pecola Leisure MD Pathologist, Electronic Signature ( Signed 01/07/2013) FINAL DIAGNOSIS Diagnosis 1. Breast, lumpectomy, Right - INVASIVE DUCTAL CARCINOMA, GRADE III (1.8 CM), SEE COMMENT. - LYMPHOVASCULAR INVASION IDENTIFIED. - INVASIVE TUMOR IS 1 MM TO NEAREST MARGIN (POSTERIOR). - DUCTAL CARCINOMA IN SITU, GRADE III WITH COMEDO NECROSIS. - IN SITU CARCINOMA IS LESS THAN 0.1 MM FROM ANTERIOR-INFERIOR AND POSTERIOR MARGINS. - SEE TUMOR TEMPLATE BELOW. 2. Lymph node, sentinel, biopsy, Right axillary - ONE LYMPH NODE, NEGATIVE FOR TUMOR (0/1), SEE COMMENT. 3. Lymph node, sentinel, biopsy, Right axillary - ONE LYMPH NODE, NEGATIVE FOR TUMOR (0/1), SEE COMMENT. Microscopic Comment 1. BREAST, INVASIVE TUMOR, WITH LYMPH NODE SAMPLING Specimen, including laterality: Right breast Procedure: Lumpectomy Grade: III of III Tubule formation: II Nuclear pleomorphism: III Mitotic:III Tumor size (gross measurement): 1.8 cm Margins: Invasive, distance to closest margin: 0.6 cm In-situ, distance to closest margin: less than 0.1 cm If margin positive, focally or broadly: N/A Lymphovascular invasion: Present Ductal carcinoma in situ: Present Grade: III of III Extensive intraductal component: Absent Lobular neoplasia: Absent Tumor focality: Unifocal Treatment effect: None If present, treatment effect in breast tissue, lymph nodes or both: N/A Extent of tumor: Skin: N/A Nipple: N/A Skeletal muscle: N/A Lymph nodes: # examined: 2 Lymph nodes with metastasis: 0 Breast prognostic profile: Estrogen receptor: Repeated, previous study demonstrated 0% positivity ([id number]) 2 of 4 FINAL for AMORE, ACKMAN ([id number]) Microscopic Comment(continued) Progesterone receptor: Repeated, previous study demonstrated 0% positivity ([id number]) Her 2 neu: Repeated, previous study  demonstrated amplification (3.88) ([id number]) Ki-67: Not repeated, previous study demonstrated 83% proliferation rate ([id number]) Non-neoplastic breast: Previous biopsy site, fibrocystic change and microcalcifications TNM: pT1c pN0 pMX   RADIOGRAPHIC STUDIES:  Mr Breast Bilateral W Wo Contrast  12/23/2012  *RADIOLOGY REPORT*  Clinical Data: recent diagnosis of invasive ductal carcinoma 12 o'clock right breast, for treatment planning  BILATERAL BREAST MRI WITH AND WITHOUT CONTRAST  Technique: Multiplanar, multisequence MR images of both breasts were obtained prior to and following the intravenous administration of 20ml of multihance.  Three dimensional images were evaluated at the independent DynaCad workstation.  Comparison:  mammographic and ultrasound images 12/08/12 and 11/20/12  Findings: There is mild background parenchymal enhancement.  The left breast is negative. There is no evidence of axillary or internal mammary adenopathy.  There is no abnormal skin or nipple enhancement.  On the right, associated with the recently placed biopsy marker clip, there is an enhancing spiculated mass in the 12 o'clock position 11cm from the nipple.  The mass measures 23 x 12mm transverse dimension by 35mm craniocaudal dimension.  1cm anterior and slightly inferior to the mass is a second 7mm mass which is connected to the dominant mass by a thin bridge of enhancement. There are no other abnormalities in the right breast.  IMPRESSION: Biopsy-proven invasive carcinoma in the 12 o'clock position of the right breast, with an additional satellite focus as described above.  BI-RADS CATEGORY 6:  Known biopsy-proven malignancy - appropriate action should be taken.  THREE-DIMENSIONAL MR IMAGE RENDERING ON INDEPENDENT WORKSTATION:  Three-dimensional MR images were rendered by post-processing of the original MR data on an independent workstation.  The three- dimensional MR images were interpreted, and findings were reported  in the accompanying complete MRI report for this study.   Original Report Authenticated By: Esperanza Heir, M.D.    Nm Sentinel Node Inj-no Rpt (breast)  12/31/2012  CLINICAL DATA: right breast cancer   Sulfur colloid was injected intradermally by the nuclear medicine  technologist for breast cancer sentinel node localization.     Dg Chest Port 1 View  12/31/2012  *RADIOLOGY REPORT*  Clinical Data: Port-A-Cath placement.  History of breast cancer.  PORTABLE CHEST - 1 VIEW  Comparison: 03/26/2011.  Findings: Central line has been placed from the left.  The tip is at the level of the distal superior vena cava/caval atrial junction.  No gross pneumothorax.  Consolidation medial aspect of the lung bases may be present possibly representing atelectasis or infiltrate.  Follow-up two- view chest with better inspiration recommended for further delineation.  Heart size top normal.  Mild central pulmonary vascular prominence.  Surgical clips project over the right lower chest and may be related to breast reconstruction/axillary lymph node dissection.  IMPRESSION: Central line has been placed from the left.  The tip is at the level of the distal superior vena cava/caval atrial junction.  No gross pneumothorax.  Consolidation medial aspect of the lung bases may be present possibly representing atelectasis or infiltrate.  Follow-up two- view chest with better inspiration recommended for further delineation.   Original Report Authenticated By: Lacy Duverney, M.D.    Dg Fluoro Guide Cv Line-no Report  12/31/2012  CLINICAL DATA: portacath   FLOURO GUIDE CV LINE  Fluoroscopy was utilized by the requesting physician.  No radiographic  interpretation.      ASSESSMENT: 52 year old female with  #1  diagnosis of invasive ductal carcinoma of the right breast she is now status post lumpectomy with sentinel lymph node biopsy with the final pathology revealing a 1.8 cm invasive ductal carcinoma grade 3 ER negative PR negative  HER-2/neu amplified at 4.36. 2 sentinel nodes were negative for metastatic disease. There was noted to be lymphovascular invasion. Postoperatively patient is doing well. She went for a reexcision for positive margin.  #2 subsequently begun on adjuvant chemotherapy consisting of Taxotere carboplatinum and Herceptin starting 01/26/2013 - 04/20/13. She only received 4 cycles of Taxotere and carboplatinum but she will continue Herceptin every three weeks to finish out a full year. She has also underwent adjuvant radiation therapy beginning 05/23/13 and completed on 06/18/13.  #3 reflux which she has a chronic history.  #4 neuropathy  Patient will continue NeurontinBID. She is also on Cymbalta 20 mg daily.  PLAN:  #1 patient will proceed with Herceptin today.  She will continue Neurontin BID and Cymbalta for the neuropathy.  It has improved in her hands, however remains unchanged and stable in her feet.  She was seen by Dr. Gala Romney with echo on 06/18/13 and was cleared to continue Herceptin therapy.  #2 she  will return in three week's time for followup and Herceptin.  We requested scheduling of more appointments today.    All questions were answered. The patient knows to call the clinic with any problems, questions or concerns. We can certainly see the patient much sooner if necessary.  I spent 15 minutes counseling the patient face to face. The total time spent in the appointment was 30 minutes.  Cherie Ouch Lyn Hollingshead, NP Medical Oncology [facility] Phone: [phone]

## 2013-08-03 NOTE — Patient Instructions (Addendum)
[facility] Discharge Instructions for Patients Receiving Chemotherapy  Today you received the following chemotherapy agent Herceptin.  If you develop nausea and vomiting that is not controlled by your nausea medication, call the clinic.   BELOW ARE SYMPTOMS THAT SHOULD BE REPORTED IMMEDIATELY:  *FEVER GREATER THAN 100.5 F  *CHILLS WITH OR WITHOUT FEVER  NAUSEA AND VOMITING THAT IS NOT CONTROLLED WITH YOUR NAUSEA MEDICATION  *UNUSUAL SHORTNESS OF BREATH  *UNUSUAL BRUISING OR BLEEDING  TENDERNESS IN MOUTH AND THROAT WITH OR WITHOUT PRESENCE OF ULCERS  *URINARY PROBLEMS  *BOWEL PROBLEMS  UNUSUAL RASH Items with * indicate a potential emergency and should be followed up as soon as possible.  Feel free to call the clinic you have any questions or concerns. The clinic phone number is [phone].   Trastuzumab injection for infusion What is this medicine? TRASTUZUMAB (tras TOO zoo mab) is a monoclonal antibody. It targets a protein called HER2. This protein is found in some stomach and breast cancers. This medicine can stop cancer cell growth. This medicine may be used with other cancer treatments. This medicine may be used for other purposes; ask your health care provider or pharmacist if you have questions. What should I tell my health care provider before I take this medicine? They need to know if you have any of these conditions: -heart disease -heart failure -infection (especially a virus infection such as chickenpox, cold sores, or herpes) -lung or breathing disease, like asthma -recent or ongoing radiation therapy -an unusual or allergic reaction to trastuzumab, benzyl alcohol, or other medications, foods, dyes, or preservatives -pregnant or trying to get pregnant -breast-feeding How should I use this medicine? This drug is given as an infusion into a vein. It is administered in a hospital or clinic by a specially trained health care  professional. Talk to your pediatrician regarding the use of this medicine in children. This medicine is not approved for use in children. Overdosage: If you think you have taken too much of this medicine contact a poison control center or emergency room at once. NOTE: This medicine is only for you. Do not share this medicine with others. What if I miss a dose? It is important not to miss a dose. Call your doctor or health care professional if you are unable to keep an appointment. What may interact with this medicine? -cyclophosphamide -doxorubicin -warfarin This list may not describe all possible interactions. Give your health care provider a list of all the medicines, herbs, non-prescription drugs, or dietary supplements you use. Also tell them if you smoke, drink alcohol, or use illegal drugs. Some items may interact with your medicine. What should I watch for while using this medicine? Visit your doctor for checks on your progress. Report any side effects. Continue your course of treatment even though you feel ill unless your doctor tells you to stop. Call your doctor or health care professional for advice if you get a fever, chills or sore throat, or other symptoms of a cold or flu. Do not treat yourself. Try to avoid being around people who are sick. You may experience fever, chills and shaking during your first infusion. These effects are usually mild and can be treated with other medicines. Report any side effects during the infusion to your health care professional. Fever and chills usually do not happen with later infusions. What side effects may I notice from receiving this medicine? Side effects that you should report to your doctor or other health care   professional as soon as possible: -breathing difficulties -chest pain or palpitations -cough -dizziness or fainting -fever or chills, sore throat -skin rash, itching or hives -swelling of the legs or ankles -unusually weak or  tired Side effects that usually do not require medical attention (report to your doctor or other health care professional if they continue or are bothersome): -loss of appetite -headache -muscle aches -nausea This list may not describe all possible side effects. Call your doctor for medical advice about side effects. You may report side effects to FDA at 1-800-FDA-1088. Where should I keep my medicine? This drug is given in a hospital or clinic and will not be stored at home. NOTE: This sheet is a summary. It may not cover all possible information. If you have questions about this medicine, talk to your doctor, pharmacist, or health care provider.  2013, [employer]. (09/30/2009 1:43:15 PM)  

## 2013-08-14 ENCOUNTER — Encounter: Payer: Self-pay | Admitting: Radiation Oncology

## 2013-08-24 ENCOUNTER — Telehealth: Payer: Self-pay | Admitting: *Deleted

## 2013-08-24 ENCOUNTER — Ambulatory Visit (HOSPITAL_BASED_OUTPATIENT_CLINIC_OR_DEPARTMENT_OTHER): Payer: BC Managed Care – PPO | Admitting: Family

## 2013-08-24 ENCOUNTER — Ambulatory Visit (HOSPITAL_BASED_OUTPATIENT_CLINIC_OR_DEPARTMENT_OTHER): Payer: BC Managed Care – PPO

## 2013-08-24 ENCOUNTER — Encounter: Payer: Self-pay | Admitting: Family

## 2013-08-24 ENCOUNTER — Other Ambulatory Visit (HOSPITAL_BASED_OUTPATIENT_CLINIC_OR_DEPARTMENT_OTHER): Payer: BC Managed Care – PPO | Admitting: Lab

## 2013-08-24 VITALS — BP 129/82 | HR 80 | Temp 98.6°F | Resp 18 | Ht 65.0 in | Wt 239.9 lb

## 2013-08-24 DIAGNOSIS — Z171 Estrogen receptor negative status [ER-]: Secondary | ICD-10-CM

## 2013-08-24 DIAGNOSIS — C50911 Malignant neoplasm of unspecified site of right female breast: Secondary | ICD-10-CM

## 2013-08-24 DIAGNOSIS — Z5112 Encounter for antineoplastic immunotherapy: Secondary | ICD-10-CM

## 2013-08-24 DIAGNOSIS — C50919 Malignant neoplasm of unspecified site of unspecified female breast: Secondary | ICD-10-CM

## 2013-08-24 DIAGNOSIS — G629 Polyneuropathy, unspecified: Secondary | ICD-10-CM

## 2013-08-24 DIAGNOSIS — M25512 Pain in left shoulder: Secondary | ICD-10-CM

## 2013-08-24 DIAGNOSIS — M25519 Pain in unspecified shoulder: Secondary | ICD-10-CM

## 2013-08-24 DIAGNOSIS — C50411 Malignant neoplasm of upper-outer quadrant of right female breast: Secondary | ICD-10-CM

## 2013-08-24 DIAGNOSIS — G608 Other hereditary and idiopathic neuropathies: Secondary | ICD-10-CM

## 2013-08-24 LAB — COMPREHENSIVE METABOLIC PANEL (CC13)
ALT: 26 U/L (ref 0–55)
CO2: 27 mEq/L (ref 22–29)
Creatinine: 0.6 mg/dL (ref 0.6–1.1)
Glucose: 96 mg/dl (ref 70–140)
Total Bilirubin: 0.45 mg/dL (ref 0.20–1.20)

## 2013-08-24 LAB — CBC WITH DIFFERENTIAL/PLATELET
Eosinophils Absolute: 0.1 10*3/uL (ref 0.0–0.5)
LYMPH%: 30.8 % (ref 14.0–49.7)
MCH: 25 pg — ABNORMAL LOW (ref 25.1–34.0)
MCV: 78 fL — ABNORMAL LOW (ref 79.5–101.0)
MONO%: 5.8 % (ref 0.0–14.0)
NEUT#: 4.4 10*3/uL (ref 1.5–6.5)
Platelets: 183 10*3/uL (ref 145–400)
RBC: 4.4 10*6/uL (ref 3.70–5.45)
nRBC: 0 % (ref 0–0)

## 2013-08-24 MED ORDER — TRASTUZUMAB CHEMO INJECTION 440 MG
6.0000 mg/kg | Freq: Once | INTRAVENOUS | Status: AC
  Administered 2013-08-24: 630 mg via INTRAVENOUS
  Filled 2013-08-24: qty 30

## 2013-08-24 MED ORDER — HEPARIN SOD (PORK) LOCK FLUSH 100 UNIT/ML IV SOLN
500.0000 [IU] | Freq: Once | INTRAVENOUS | Status: AC | PRN
  Administered 2013-08-24: 500 [IU]
  Filled 2013-08-24: qty 5

## 2013-08-24 MED ORDER — SODIUM CHLORIDE 0.9 % IJ SOLN
10.0000 mL | INTRAMUSCULAR | Status: DC | PRN
  Administered 2013-08-24: 10 mL
  Filled 2013-08-24: qty 10

## 2013-08-24 MED ORDER — SODIUM CHLORIDE 0.9 % IV SOLN
Freq: Once | INTRAVENOUS | Status: AC
  Administered 2013-08-24: 11:00:00 via INTRAVENOUS

## 2013-08-24 MED ORDER — ACETAMINOPHEN 325 MG PO TABS
650.0000 mg | ORAL_TABLET | Freq: Once | ORAL | Status: AC
  Administered 2013-08-24: 650 mg via ORAL

## 2013-08-24 MED ORDER — ACETAMINOPHEN 325 MG PO TABS
ORAL_TABLET | ORAL | Status: AC
  Filled 2013-08-24: qty 2

## 2013-08-24 NOTE — Telephone Encounter (Signed)
appts made and printed. Pt is aware that Harriett Sine from PT will call her w/ an appt .Marland Kitchen..td

## 2013-08-24 NOTE — Patient Instructions (Addendum)
[facility] Discharge Instructions for Patients Receiving Chemotherapy  Today you received the following chemotherapy agents: Herceptin   To help prevent nausea and vomiting after your treatment, we encourage you to take your nausea medication as directed by your physician.   If you develop nausea and vomiting that is not controlled by your nausea medication, call the clinic.   BELOW ARE SYMPTOMS THAT SHOULD BE REPORTED IMMEDIATELY:  *FEVER GREATER THAN 100.5 F  *CHILLS WITH OR WITHOUT FEVER  NAUSEA AND VOMITING THAT IS NOT CONTROLLED WITH YOUR NAUSEA MEDICATION  *UNUSUAL SHORTNESS OF BREATH  *UNUSUAL BRUISING OR BLEEDING  TENDERNESS IN MOUTH AND THROAT WITH OR WITHOUT PRESENCE OF ULCERS  *URINARY PROBLEMS  *BOWEL PROBLEMS  UNUSUAL RASH Items with * indicate a potential emergency and should be followed up as soon as possible.  Feel free to call the clinic you have any questions or concerns. The clinic phone number is [phone].    

## 2013-08-24 NOTE — Patient Instructions (Addendum)
Please contact us at [phone] if you have any questions or concerns.  Please continue to do well and enjoy life!!!  Get plenty of rest, drink plenty of water, exercise daily (walking - as tolerated), eat a balanced diet.  Take Vitamin D3 1000 IUs daily.   Results for orders placed in visit on 08/24/13 (from the past 24 hour(s))  CBC WITH DIFFERENTIAL     Status: Abnormal   Collection Time    08/24/13  8:50 AM      Result Value Range   WBC 7.2  3.9 - 10.3 10e3/uL   NEUT# 4.4  1.5 - 6.5 10e3/uL   HGB 11.0 (*) 11.6 - 15.9 g/dL   HCT 16.1 (*) 09.6 - 04.5 %   Platelets 183  145 - 400 10e3/uL   MCV 78.0 (*) 79.5 - 101.0 fL   MCH 25.0 (*) 25.1 - 34.0 pg   MCHC 32.1  31.5 - 36.0 g/dL   RBC 4.09  8.11 - 9.14 10e6/uL   RDW 13.1  11.2 - 14.5 %   lymph# 2.2  0.9 - 3.3 10e3/uL   MONO# 0.4  0.1 - 0.9 10e3/uL   Eosinophils Absolute 0.1  0.0 - 0.5 10e3/uL   Basophils Absolute 0.0  0.0 - 0.1 10e3/uL   NEUT% 60.9  38.4 - 76.8 %   LYMPH% 30.8  14.0 - 49.7 %   MONO% 5.8  0.0 - 14.0 %   EOS% 1.9  0.0 - 7.0 %   BASO% 0.6  0.0 - 2.0 %   nRBC 0  0 - 0 %   Narrative:    Performed At:  [facility]               [address]               [place], [place] [address]

## 2013-08-24 NOTE — Progress Notes (Signed)
[facility]  [phone] [phone]) [phone]  OFFICE PROGRESS NOTE    CC: Tonye Pearson, MD [address] [place] [address] GYN:  Richarda Overlie, MD SU:  Almond Lint, MD RAD ONC:  Lurline Hare, MD   DIAGNOSIS: 52 year old woman with invasive ductal carcinoma of the right breast diagnosed in 11/2012.   PRIOR THERAPY: #1  Patient was originally seen in the multidisciplinary breast clinic on 12/17/2012 with a screening mammogram that showed an abnormality in the right breast. This abnormality measured 1.6 cm. She had a biopsy performed at the 11:30 position that showed invasive ductal carcinoma grade 2, estrogen receptor negative, progesterone receptor negative, HER-2 positive with a Ki-67 of 83%.  #2  Status post right lumpectomy on 12/31/2012 with final pathology revealing a stage I, pT1c pN0 pMX, 8 cm invasive ductal carcinoma, grade 3 with lymphovascular invasion, estrogen receptor negative, PR negative, HER-2/neu positive with amplification of 4.36, Ki-67 83% 2 sentinel nodes were negative for metastatic disease. Patient did not have positive margins and subsequently underwent right posterior margin, right anterior margin, and right anterior margin reexcision on 01/14/2013 that showed no residual tumor, and no atypia or malignancy.  Patient has a Port-A-Cath in place.  #3  The patient received with adjuvant chemotherapy initially consisting of Taxotere/Carboplatinum and Herceptin. Taxotere and Carboplatinum was given every 21 days and Herceptin on a weekly basis for a total of 4 cycles from 01/26/2013 - 03/30/2013.   Taxotere/Carboplatin was discontinued after 4 cycles due to worsening neuropathy.  Weekly Herceptin therapy was continued until 05/06/2013.  Currently receiving adjuvant Herceptin therapy every 3 weeks that started on 05/11/2013.    #4  The patient had radiation therapy from 05/13/2013 through 06/18/2013.   CURRENT THERAPY:  Herceptin every 3 weeks.   INTERVAL HISTORY: Julia Zuniga 52 y.o. female returns for followup today.  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  The neuropathy in her hands has improved, although still present.  She discontinued taking Neurontin and Cymbalta 12 days ago (08/12/2013) for neuropathy and stated that she "felt no difference" in her neuropathy while on the medication.  The patient states that her energy is better since being off of Cymbalta and Neurontin which made her feel "sluggish." The patient also has complaints of left shoulder pain that has persisted for the past 9 weeks and intermittent right breast pain.    MEDICAL HISTORY: Past Medical History  Diagnosis Date  . Hyperlipidemia   . Diverticulitis   . Hernia, hiatal   . Heartburn   . Anxiety   . Hot flashes   . PONV (postoperative nausea and vomiting)   . Breast cancer 12/08/12    Right  . History of radiation therapy 05/13/13-06/18/13    right breast, 5000 cGy 25 sessions     ALLERGIES:  is allergic to chlorhexidine; codeine; medralone; and oxycodone-acetaminophen.  MEDICATIONS:  Current Outpatient Prescriptions  Medication Sig Dispense Refill  . atorvastatin (LIPITOR) 40 MG tablet Take 1 tablet (40 mg total) by mouth every evening.  30 tablet  3  . B Complex-C (SUPER B COMPLEX PO) Take 1 tablet by mouth daily.      . carvedilol (COREG) 6.25 MG tablet Take 1 tablet (6.25 mg total) by mouth 2 (two) times daily with a meal.  60 tablet  3  . ibuprofen (ADVIL,MOTRIN) 200 MG tablet Take 400 mg by mouth every 6 (six) hours as needed. Pain      .  omeprazole (PRILOSEC) 40 MG capsule Take 1 capsule (40 mg total) by mouth daily.  90 capsule  2  . HYDROcodone-acetaminophen (NORCO) 5-325 MG per tablet Take 1-2 tablets by mouth every 6 (six) hours as needed for pain.  30 tablet  1   No current facility-administered medications for this visit.    SURGICAL HISTORY:  Past  Surgical History  Procedure Laterality Date  . Cesarean section      x 3  . Tubal ligation    . Tonsillectomy and adenoidectomy    . Cholecystectomy  2001    lap choli  . Breast lumpectomy with sentinel lymph node biopsy  12/31/2012    Procedure: BREAST LUMPECTOMY WITH SENTINEL LYMPH NODE BX;  Surgeon: Almond Lint, MD;  Location: [facility];  Service: General;  Laterality: Right;  . Portacath placement  12/31/2012    Procedure: INSERTION PORT-A-CATH;  Surgeon: Almond Lint, MD;  Location: [facility];  Service: General;  Laterality: Left;  Left Subclavian Vein  . Re-excision of breast cancer,superior margins  01/14/2013    Procedure: RE-EXCISION OF BREAST CANCER,SUPERIOR MARGINS;  Surgeon: Almond Lint, MD;  Location: [facility];  Service: General;  Laterality: Right;  right breast re excision for markers     REVIEW OF SYSTEMS:   A 10 point review of systems was completed and is negative except as noted in the interval history. The patient denies any other symptomatology including fatigue, fever or chills, headache, vision changes, swollen glands, cough or shortness of breath, chest pain or discomfort, nausea, vomiting, diarrhea, constipation, change in urinary or bowel habits, unusual bleeding/bruising or any other symptomatology.   PHYSICAL EXAMINATION: Blood pressure 129/82, pulse 80, temperature 98.6 F (37 C), temperature source Oral, resp. rate 18, height 5\' 5"  (1.651 m), weight 239 lb 14.4 oz (108.818 kg), last menstrual period 01/07/2009, SpO2 100.00%. Body mass index is 39.92 kg/(m^2).  ECOG PERFORMANCE STATUS: 1 - Symptomatic but completely ambulatory  General appearance: Alert, cooperative, well nourished, no apparent distress Head: Normocephalic, without obvious abnormality, atraumatic Eyes: Conjunctivae/corneas clear, PERRLA, EOMI Nose: Nares, septum and mucosa are normal, no drainage or sinus tenderness Neck: No adenopathy, supple, symmetrical, trachea  midline, no tenderness Resp: Clear to auscultation bilaterally, no wheezes/rales/rhonchi Cardio: Regular rate and rhythm, S1, S2 normal, no murmur, click, rub or gallop, no edema, left chest Port-A-Cath covered in EMLA cream Breasts:  Deferred  GI: Soft, distended, non-tender, hypoactive bowel sounds, no organomegaly Skin: No rashes/lesions, skin warm and dry, no erythematous areas, no cyanosis  M/S:  Atraumatic, 3/5 strength in left upper extremity, left upper extremity limited range of motion, no clubbing  Lymph nodes: Cervical, supraclavicular, and axillary nodes normal Neurologic: Grossly normal, cranial nerves II through XII intact, alert and oriented x 3 Psych: Appropriate affect   LABORATORY DATA: Lab Results  Component Value Date   WBC 7.2 08/24/2013   HGB 11.0* 08/24/2013   HCT 34.3* 08/24/2013   MCV 78.0* 08/24/2013   PLT 183 08/24/2013      Chemistry      Component Value Date/Time   NA 142 08/24/2013 0852   NA 135 02/24/2013 1857   K 4.3 08/24/2013 0852   K 3.4* 02/24/2013 1857   CL 106 06/01/2013 1045   CL 97 02/24/2013 1857   CO2 27 08/24/2013 0852   CO2 29 02/24/2013 1857   BUN 13.3 08/24/2013 0852   BUN 6 02/24/2013 1857   CREATININE 0.6 08/24/2013 0852   CREATININE 0.55 02/24/2013 1857  Component Value Date/Time   CALCIUM 9.9 08/24/2013 0852   CALCIUM 9.2 02/24/2013 1857   ALKPHOS 132 08/24/2013 0852   ALKPHOS 113 02/24/2013 1857   AST 15 08/24/2013 0852   AST 18 02/24/2013 1857   ALT 26 08/24/2013 0852   ALT 30 02/24/2013 1857   BILITOT 0.45 08/24/2013 0852   BILITOT 0.2* 02/24/2013 1857      RADIOGRAPHIC STUDIES: No results found.   ASSESSMENT: 52 year old female with: #1  Patient was originally seen in the multidisciplinary breast clinic on 12/17/2012 with a screening mammogram that showed an abnormality in the right breast. This abnormality measured 1.6 cm. She had a biopsy performed at the 11:30 position that showed invasive ductal carcinoma grade 2, estrogen  receptor negative, progesterone receptor negative, HER-2 positive with a Ki-67 of 83%.  #2  Status post right lumpectomy on 12/31/2012 with final pathology revealing a stage I, pT1c pN0 pMX, 8 cm invasive ductal carcinoma, grade 3 with lymphovascular invasion, estrogen receptor negative, PR negative, HER-2/neu positive with amplification of 4.36, Ki-67 83% 2 sentinel nodes were negative for metastatic disease. Patient did not have positive margins and subsequently underwent right posterior margin, right anterior margin, and right anterior margin reexcision on 01/14/2013 that showed no residual tumor, and no atypia or malignancy.  Patient has a Port-A-Cath in place.  #3  The patient received with adjuvant chemotherapy initially consisting of Taxotere/Carboplatinum and Herceptin. Taxotere and Carboplatinum was given every 21 days and Herceptin on a weekly basis for a total of 4 cycles from 01/26/2013 - 03/30/2013.   Taxotere/Carboplatin was discontinued after 4 cycles due to worsening neuropathy.  Weekly Herceptin therapy was continued until 05/06/2013.  Currently receiving adjuvant Herceptin therapy every 3 weeks that started on 05/11/2013.    #4  The patient had radiation therapy from 05/13/2013 through 06/18/2013.  #5  Neuropathy  #6 Left upper extremity shoulder pain.   PLAN:  #1 Ms. Ellington will proceed with Herceptin therapy today.  She will be scheduled for 2-D echo and to be seen by Dr. Gala Romney prior to her next Herceptin infusion.  Her next Herceptin  is scheduled for 09/14/2013.  #2 The patient is taking a B complex vitamin for neuropathy and prefers not to take Cymbalta and Neurontin.  She has also been asked to take vitamin D3 1000 IUs by mouth daily.  The patient has been referred to [facility] PT[facility]hab specialists - we will ask them to evaluate/treat her neuropathy as well.  #3  Ms. Willard has been referred to [facility] PT/Rehab specialists for left upper extremity pain.  If her pain does not  improve by her next office visit and/or with physical therapy, we will consider imaging her left shoulder at that time.  #4  She will return in three week's time for an office visit/assessment/laboratories prior to proceeding with Herceptin therapy.  Herceptin therapy is dependent upon cardiac clearance from Dr. Gala Romney.  All questions answered.  Ms. Scheerer was encouraged to contact us in the interim with any questions, concerns, or problems.  Larina Bras, NP-C   08/24/2013  6:06 PM

## 2013-08-25 ENCOUNTER — Other Ambulatory Visit: Payer: Self-pay | Admitting: Oncology

## 2013-08-25 ENCOUNTER — Other Ambulatory Visit: Payer: Self-pay | Admitting: Family

## 2013-08-25 DIAGNOSIS — C50911 Malignant neoplasm of unspecified site of right female breast: Secondary | ICD-10-CM

## 2013-08-25 DIAGNOSIS — Z853 Personal history of malignant neoplasm of breast: Secondary | ICD-10-CM

## 2013-08-25 DIAGNOSIS — G629 Polyneuropathy, unspecified: Secondary | ICD-10-CM

## 2013-08-26 ENCOUNTER — Encounter: Payer: Self-pay | Admitting: Radiation Oncology

## 2013-08-26 ENCOUNTER — Ambulatory Visit
Admission: RE | Admit: 2013-08-26 | Discharge: 2013-08-26 | Disposition: A | Discharge status: Home / Self Care | Payer: BC Managed Care – PPO | Source: Ambulatory Visit | Attending: Radiation Oncology | Admitting: Radiation Oncology

## 2013-08-26 VITALS — BP 122/84 | HR 71 | Temp 98.6°F | Resp 20 | Wt 241.0 lb

## 2013-08-26 DIAGNOSIS — C50411 Malignant neoplasm of upper-outer quadrant of right female breast: Secondary | ICD-10-CM

## 2013-08-26 NOTE — Progress Notes (Signed)
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 Her primary tumor was ER/PR negative and HER-2/neu positive. She continues with her Herceptin. She will complete this in February 2015. She has not yet seen Dr. Donell Beers. She maintains her followup through Dr. Welton Flakes and her team. She discontinued her Neurontin and Cymbalta with no change in her symptomatology. She is going to physical therapy for left shoulder pain which is now improved. She is scheduled for mammography at the [facility] in December.  Physical examination: Alert and oriented. Filed Vitals:   08/26/13 1051  BP: 122/84  Pulse: 71  Temp: 98.6 F (37 C)  Resp: 20   And neck examination: Remarkable for regrowth of hair. Nodes: Without palpable cervical, supraclavicular, or axillary lymphadenopathy. Chest: Lungs clear. Breasts: There is residual hyperpigmentation the skin along the right breast with a small modest volume defect. No masses are appreciated. Left breast without masses or lesions. Extremities: Without edema.  Impression: Satisfactory progress.  Plan: She'll have mammography this December and maintain her followup with Dr. Welton Flakes and her team.

## 2013-08-26 NOTE — Progress Notes (Addendum)
Pt denies pain, fatigue, loss of appetite. She is currently receiving Herceptin every three weeks.  Gave pt [facility] brochure.

## 2013-09-09 ENCOUNTER — Encounter (HOSPITAL_COMMUNITY): Payer: Self-pay

## 2013-09-09 ENCOUNTER — Ambulatory Visit (HOSPITAL_BASED_OUTPATIENT_CLINIC_OR_DEPARTMENT_OTHER)
Admission: RE | Admit: 2013-09-09 | Discharge: 2013-09-09 | Disposition: A | Discharge status: Home / Self Care | Payer: BC Managed Care – PPO | Source: Ambulatory Visit | Attending: Internal Medicine | Admitting: Internal Medicine

## 2013-09-09 ENCOUNTER — Ambulatory Visit (HOSPITAL_COMMUNITY)
Admission: RE | Admit: 2013-09-09 | Discharge: 2013-09-09 | Disposition: A | Discharge status: Home / Self Care | Payer: BC Managed Care – PPO | Source: Ambulatory Visit | Attending: Adult Health | Admitting: Adult Health

## 2013-09-09 VITALS — BP 118/84 | HR 75 | Wt 241.0 lb

## 2013-09-09 DIAGNOSIS — Z09 Encounter for follow-up examination after completed treatment for conditions other than malignant neoplasm: Secondary | ICD-10-CM

## 2013-09-09 DIAGNOSIS — E785 Hyperlipidemia, unspecified: Secondary | ICD-10-CM | POA: Insufficient documentation

## 2013-09-09 DIAGNOSIS — I10 Essential (primary) hypertension: Secondary | ICD-10-CM | POA: Insufficient documentation

## 2013-09-09 DIAGNOSIS — C50419 Malignant neoplasm of upper-outer quadrant of unspecified female breast: Secondary | ICD-10-CM | POA: Insufficient documentation

## 2013-09-09 DIAGNOSIS — C50411 Malignant neoplasm of upper-outer quadrant of right female breast: Secondary | ICD-10-CM

## 2013-09-09 DIAGNOSIS — K449 Diaphragmatic hernia without obstruction or gangrene: Secondary | ICD-10-CM | POA: Insufficient documentation

## 2013-09-09 NOTE — Progress Notes (Signed)
Patient ID: Julia Zuniga, female   DOB: 30-Dec-1960, 52 y.o.   MRN: [id number] Referring Physician: Dr. Welton Flakes Primary Care: Dr. Ellamae Sia Primary Cardiologist: new  HPI: Julia Zuniga ia a 52 y.o. 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 HER-2/neu was amplified at 3.88.  S/P R lumpectomy on 12/31/12.  +family history of CAD.  She has completed 4/6 cycles of  taxotere, carboplatinum and herceptin. Also completed XRT.  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 She will continue herceptin every three weeks until February 2015.   Echos: 01/06/13: EF 55-60%, lat s' 9.2 03/26/13: EF 55-60% lat s' 9.8 06/18/13 EF 55--60 % lat s' 9.7 Global Strain -15.8 09/09/13 EF 60% lat s' 9.6% GS - 20%  She returns for follow up today. Starting to feel better. Has significant arthritis pain in hands and shoulders predominantly. Denies SOB/PND/Orthopnea.     Review of Systems: All pertinent positives and negatives as in HPI, otherwise negative.   Past Medical History  Diagnosis Date  . Hyperlipidemia   . Diverticulitis   . Hernia, hiatal   . Heartburn   . Anxiety   . Hot flashes   . PONV (postoperative nausea and vomiting)   . Breast cancer 12/08/12    Right  . History of radiation therapy 05/13/13-06/18/13    right breast, 5000 cGy 25 sessions    Current Outpatient Prescriptions  Medication Sig Dispense Refill  . atorvastatin (LIPITOR) 40 MG tablet Take 1 tablet (40 mg total) by mouth every evening.  30 tablet  3  . B Complex-C (SUPER B COMPLEX PO) Take 1 tablet by mouth daily.      . carvedilol (COREG) 6.25 MG tablet Take 1 tablet (6.25 mg total) by mouth 2 (two) times daily with a meal.  60 tablet  3  . HYDROcodone-acetaminophen (NORCO) 5-325 MG per tablet Take 1-2 tablets by mouth every 6 (six) hours as needed for pain.  30 tablet  1   . ibuprofen (ADVIL,MOTRIN) 200 MG tablet Take 400 mg by mouth every 6 (six) hours as needed. Pain      . Multiple Vitamins-Minerals (MULTIVITAMIN WITH MINERALS) tablet Take 1 tablet by mouth daily.      Marland Kitchen omeprazole (PRILOSEC) 40 MG capsule Take 1 capsule (40 mg total) by mouth daily.  90 capsule  2   No current facility-administered medications for this encounter.    Allergies  Allergen Reactions  . Chlorhexidine     Patient skin gets bright red with rash.      . Codeine Swelling  . Medralone [Methylprednisolone Acetate]     "coming out of skin"   . Oxycodone-Acetaminophen Other (See Comments)    Face got red and hot      PHYSICAL EXAM: Filed Vitals:   09/09/13 0956  BP: 92/70  Pulse: 75  Weight: 241 lb (109.317 kg)  SpO2: 97%    General:  Well appearing. No respiratory difficulty HEENT: normal Neck: supple. no JVD. Carotids 2+ bilat; no bruits. No lymphadenopathy or thryomegaly appreciated. Cor: PMI nondisplaced. Regular rate & rhythm. No rubs, gallops or murmurs. Lungs: clear Abdomen: obese, soft, nontender, nondistended. No hepatosplenomegaly. No bruits or masses. Good bowel sounds. Extremities: no cyanosis, clubbing, rash, edema Neuro: alert & oriented x 3, cranial nerves grossly intact. moves all 4 extremities w/o difficulty. Affect pleasant.   ASSESSMENT &  PLAN:  1.  Breast Cancer On Herceptin. Plan to complete Herceptin 01/2014.      I reviewed echos personally. EF and Doppler parameters stable. No HF on exam. Continue Herceptin.   Julia Kleckner,MD 10:32 AM

## 2013-09-09 NOTE — Progress Notes (Signed)
*  PRELIMINARY RESULTS* Echocardiogram 2D Echocardiogram has been performed.  Julia Zuniga 09/09/2013, 9:56 AM

## 2013-09-09 NOTE — Patient Instructions (Signed)
Follow up in 3 months with ECHO

## 2013-09-11 ENCOUNTER — Encounter (HOSPITAL_COMMUNITY): Payer: BC Managed Care – PPO

## 2013-09-11 ENCOUNTER — Other Ambulatory Visit (HOSPITAL_COMMUNITY): Payer: BC Managed Care – PPO

## 2013-09-14 ENCOUNTER — Ambulatory Visit (HOSPITAL_BASED_OUTPATIENT_CLINIC_OR_DEPARTMENT_OTHER): Payer: BC Managed Care – PPO

## 2013-09-14 ENCOUNTER — Encounter: Payer: Self-pay | Admitting: Adult Health

## 2013-09-14 ENCOUNTER — Ambulatory Visit (HOSPITAL_COMMUNITY)
Admission: RE | Admit: 2013-09-14 | Discharge: 2013-09-14 | Disposition: A | Discharge status: Home / Self Care | Payer: BC Managed Care – PPO | Source: Ambulatory Visit | Attending: Oncology | Admitting: Oncology

## 2013-09-14 ENCOUNTER — Telehealth: Payer: Self-pay | Admitting: *Deleted

## 2013-09-14 ENCOUNTER — Ambulatory Visit (HOSPITAL_BASED_OUTPATIENT_CLINIC_OR_DEPARTMENT_OTHER): Payer: BC Managed Care – PPO | Admitting: Adult Health

## 2013-09-14 ENCOUNTER — Other Ambulatory Visit (HOSPITAL_BASED_OUTPATIENT_CLINIC_OR_DEPARTMENT_OTHER): Payer: BC Managed Care – PPO | Admitting: Lab

## 2013-09-14 ENCOUNTER — Ambulatory Visit (HOSPITAL_COMMUNITY)
Admission: RE | Admit: 2013-09-14 | Discharge: 2013-09-14 | Disposition: A | Discharge status: Home / Self Care | Payer: BC Managed Care – PPO | Source: Ambulatory Visit | Attending: Adult Health | Admitting: Adult Health

## 2013-09-14 VITALS — BP 113/78 | HR 87 | Temp 98.3°F | Resp 18 | Ht 65.0 in | Wt 242.3 lb

## 2013-09-14 DIAGNOSIS — M79609 Pain in unspecified limb: Secondary | ICD-10-CM | POA: Insufficient documentation

## 2013-09-14 DIAGNOSIS — M542 Cervicalgia: Secondary | ICD-10-CM

## 2013-09-14 DIAGNOSIS — C50919 Malignant neoplasm of unspecified site of unspecified female breast: Secondary | ICD-10-CM

## 2013-09-14 DIAGNOSIS — C50411 Malignant neoplasm of upper-outer quadrant of right female breast: Secondary | ICD-10-CM

## 2013-09-14 DIAGNOSIS — M25512 Pain in left shoulder: Secondary | ICD-10-CM

## 2013-09-14 DIAGNOSIS — Z5112 Encounter for antineoplastic immunotherapy: Secondary | ICD-10-CM

## 2013-09-14 DIAGNOSIS — G609 Hereditary and idiopathic neuropathy, unspecified: Secondary | ICD-10-CM

## 2013-09-14 DIAGNOSIS — Z171 Estrogen receptor negative status [ER-]: Secondary | ICD-10-CM

## 2013-09-14 DIAGNOSIS — C50419 Malignant neoplasm of upper-outer quadrant of unspecified female breast: Secondary | ICD-10-CM

## 2013-09-14 DIAGNOSIS — M25519 Pain in unspecified shoulder: Secondary | ICD-10-CM

## 2013-09-14 DIAGNOSIS — M948X9 Other specified disorders of cartilage, unspecified sites: Secondary | ICD-10-CM | POA: Insufficient documentation

## 2013-09-14 LAB — CBC WITH DIFFERENTIAL/PLATELET
BASO%: 0.8 % (ref 0.0–2.0)
Eosinophils Absolute: 0.2 10*3/uL (ref 0.0–0.5)
LYMPH%: 26.5 % (ref 14.0–49.7)
MCHC: 32.1 g/dL (ref 31.5–36.0)
MCV: 78 fL — ABNORMAL LOW (ref 79.5–101.0)
MONO#: 0.4 10*3/uL (ref 0.1–0.9)
NEUT#: 4.9 10*3/uL (ref 1.5–6.5)
Platelets: 195 10*3/uL (ref 145–400)
RBC: 4.4 10*6/uL (ref 3.70–5.45)
RDW: 14.2 % (ref 11.2–14.5)
WBC: 7.4 10*3/uL (ref 3.9–10.3)
lymph#: 2 10*3/uL (ref 0.9–3.3)

## 2013-09-14 LAB — COMPREHENSIVE METABOLIC PANEL (CC13)
ALT: 21 U/L (ref 0–55)
AST: 13 U/L (ref 5–34)
Albumin: 3.1 g/dL — ABNORMAL LOW (ref 3.5–5.0)
CO2: 21 mEq/L — ABNORMAL LOW (ref 22–29)
Glucose: 67 mg/dl — ABNORMAL LOW (ref 70–140)
Potassium: 3.4 mEq/L — ABNORMAL LOW (ref 3.5–5.1)
Sodium: 144 mEq/L (ref 136–145)
Total Bilirubin: 0.39 mg/dL (ref 0.20–1.20)
Total Protein: 5.6 g/dL — ABNORMAL LOW (ref 6.4–8.3)

## 2013-09-14 MED ORDER — ACETAMINOPHEN 325 MG PO TABS
650.0000 mg | ORAL_TABLET | Freq: Once | ORAL | Status: AC
  Administered 2013-09-14: 650 mg via ORAL

## 2013-09-14 MED ORDER — ACETAMINOPHEN 325 MG PO TABS
ORAL_TABLET | ORAL | Status: AC
  Filled 2013-09-14: qty 2

## 2013-09-14 MED ORDER — HEPARIN SOD (PORK) LOCK FLUSH 100 UNIT/ML IV SOLN
500.0000 [IU] | Freq: Once | INTRAVENOUS | Status: AC | PRN
  Administered 2013-09-14: 500 [IU]
  Filled 2013-09-14: qty 5

## 2013-09-14 MED ORDER — SODIUM CHLORIDE 0.9 % IJ SOLN
10.0000 mL | INTRAMUSCULAR | Status: DC | PRN
  Administered 2013-09-14: 10 mL
  Filled 2013-09-14: qty 10

## 2013-09-14 MED ORDER — SODIUM CHLORIDE 0.9 % IV SOLN
Freq: Once | INTRAVENOUS | Status: AC
  Administered 2013-09-14: 10:00:00 via INTRAVENOUS

## 2013-09-14 MED ORDER — TRASTUZUMAB CHEMO INJECTION 440 MG
6.0000 mg/kg | Freq: Once | INTRAVENOUS | Status: AC
  Administered 2013-09-14: 630 mg via INTRAVENOUS
  Filled 2013-09-14: qty 30

## 2013-09-14 NOTE — Progress Notes (Signed)
[facility]  Telephone:(336) [phone] Fax:(336) [phone]  OFFICE PROGRESS NOTE    CC: Tonye Pearson, MD [address] [place] [address] GYN:  Richarda Overlie, MD SU:  Almond Lint, MD RAD ONC:  Lurline Hare, MD   DIAGNOSIS: 52 year old woman with invasive ductal carcinoma of the right breast diagnosed in 11/2012.   PRIOR THERAPY: #1  Patient was originally seen in the multidisciplinary breast clinic on 12/17/2012 with a screening mammogram that showed an abnormality in the right breast. This abnormality measured 1.6 cm. She had a biopsy performed at the 11:30 position that showed invasive ductal carcinoma grade 2, estrogen receptor negative, progesterone receptor negative, HER-2 positive with a Ki-67 of 83%.  #2  Status post right lumpectomy on 12/31/2012 with final pathology revealing a stage I, pT1c pN0 pMX, 8 cm invasive ductal carcinoma, grade 3 with lymphovascular invasion, estrogen receptor negative, PR negative, HER-2/neu positive with amplification of 4.36, Ki-67 83% 2 sentinel nodes were negative for metastatic disease. Patient did not have positive margins and subsequently underwent right posterior margin, right anterior margin, and right anterior margin reexcision on 01/14/2013 that showed no residual tumor, and no atypia or malignancy.  Patient has a Port-A-Cath in place.  #3  The patient received with adjuvant chemotherapy initially consisting of Taxotere/Carboplatinum and Herceptin. Taxotere and Carboplatinum was given every 21 days and Herceptin on a weekly basis for a total of 4 cycles from 01/26/2013 - 03/30/2013.   Taxotere/Carboplatin was discontinued after 4 cycles due to worsening neuropathy.  Weekly Herceptin therapy was continued until 05/06/2013.  Currently receiving adjuvant Herceptin therapy every 3 weeks that started on 05/11/2013.    #4  The patient had radiation therapy from 05/13/2013 through 06/18/2013.   CURRENT THERAPY:  Herceptin every 3 weeks.   INTERVAL HISTORY: SHELVY HECKERT 52 y.o. female returns for followup today prior to herceptin therapy.  She had an echocardiogram on 09/09/13 and was cleared to continue Herceptin therapy by Dr. Gala Romney.  She continues to have some intermittent shoulder pain.  It is mostly after lying down.  Today she describes it as being around her port and catheter line on occasion.  She takes ibuprofen and the pain resolves.  She was referred to physical therapy, and is planning on canceling her appointment due to the fact that it isn't interfering with her activities.  She is interested in having an xray and doppler of this area.  Her numbness she describes is 80-85% improved.  It is very mild in her feet and has disappeared in her fingertips.  She continues to have some hot flashes and joint pain, otherwise, a 10 point ROS is neg.     MEDICAL HISTORY: Past Medical History  Diagnosis Date  . Hyperlipidemia   . Diverticulitis   . Hernia, hiatal   . Heartburn   . Anxiety   . Hot flashes   . PONV (postoperative nausea and vomiting)   . Breast cancer 12/08/12    Right  . History of radiation therapy 05/13/13-06/18/13    right breast, 5000 cGy 25 sessions     ALLERGIES:  is allergic to chlorhexidine; codeine; medralone; and oxycodone-acetaminophen.  MEDICATIONS:  Current Outpatient Prescriptions  Medication Sig Dispense Refill  . atorvastatin (LIPITOR) 40 MG tablet Take 1 tablet (40 mg total) by mouth every evening.  30 tablet  3  . B Complex-C (SUPER B COMPLEX PO) Take 1 tablet by mouth daily.      . carvedilol (COREG) 6.25 MG  tablet Take 1 tablet (6.25 mg total) by mouth 2 (two) times daily with a meal.  60 tablet  3  . HYDROcodone-acetaminophen (NORCO) 5-325 MG per tablet Take 1-2 tablets by mouth every 6 (six) hours as needed for pain.  30 tablet  1  . ibuprofen (ADVIL,MOTRIN) 200 MG tablet Take 400 mg by mouth every 6 (six) hours as needed. Pain      . Multiple  Vitamins-Minerals (MULTIVITAMIN WITH MINERALS) tablet Take 1 tablet by mouth daily.      Marland Kitchen omeprazole (PRILOSEC) 40 MG capsule Take 1 capsule (40 mg total) by mouth daily.  90 capsule  2   No current facility-administered medications for this visit.    SURGICAL HISTORY:  Past Surgical History  Procedure Laterality Date  . Cesarean section      x 3  . Tubal ligation    . Tonsillectomy and adenoidectomy    . Cholecystectomy  2001    lap choli  . Breast lumpectomy with sentinel lymph node biopsy  12/31/2012    Procedure: BREAST LUMPECTOMY WITH SENTINEL LYMPH NODE BX;  Surgeon: Almond Lint, MD;  Location: [facility];  Service: General;  Laterality: Right;  . Portacath placement  12/31/2012    Procedure: INSERTION PORT-A-CATH;  Surgeon: Almond Lint, MD;  Location: [facility];  Service: General;  Laterality: Left;  Left Subclavian Vein  . Re-excision of breast cancer,superior margins  01/14/2013    Procedure: RE-EXCISION OF BREAST CANCER,SUPERIOR MARGINS;  Surgeon: Almond Lint, MD;  Location: [facility];  Service: General;  Laterality: Right;  right breast re excision for markers     REVIEW OF SYSTEMS:   A 10 point review of systems was completed and is negative except as noted in the interval history.  PHYSICAL EXAMINATION: Blood pressure 113/78, pulse 87, temperature 98.3 F (36.8 C), temperature source Oral, resp. rate 18, height 5\' 5"  (1.651 m), weight 242 lb 4.8 oz (109.907 kg), last menstrual period 01/07/2009. Body mass index is 40.32 kg/(m^2). General: Patient is a well appearing female in no acute distress HEENT: PERRLA, sclerae anicteric no conjunctival pallor, MMM Neck: supple, no palpable adenopathy Lungs: clear to auscultation bilaterally, no wheezes, rhonchi, or rales Cardiovascular: regular rate rhythm, S1, S2, no murmurs, rubs or gallops Abdomen: Soft, non-tender, non-distended, normoactive bowel sounds, no HSM Extremities: warm and well perfused,  no clubbing, cyanosis, or edema, left shoulder no point tenderness Skin: No rashes or lesions Neuro: Non-focal Breasts: right breast lumpectomy site well healed, no nodularity, skin changes from radiation noted, left breast no masses or lesions ECOG PERFORMANCE STATUS: 1 - Symptomatic but completely ambulatory   LABORATORY DATA: Lab Results  Component Value Date   WBC 7.4 09/14/2013   HGB 11.0* 09/14/2013   HCT 34.3* 09/14/2013   MCV 78.0* 09/14/2013   PLT 195 09/14/2013      Chemistry      Component Value Date/Time   NA 142 08/24/2013 0852   NA 135 02/24/2013 1857   K 4.3 08/24/2013 0852   K 3.4* 02/24/2013 1857   CL 106 06/01/2013 1045   CL 97 02/24/2013 1857   CO2 27 08/24/2013 0852   CO2 29 02/24/2013 1857   BUN 13.3 08/24/2013 0852   BUN 6 02/24/2013 1857   CREATININE 0.6 08/24/2013 0852   CREATININE 0.55 02/24/2013 1857      Component Value Date/Time   CALCIUM 9.9 08/24/2013 0852   CALCIUM 9.2 02/24/2013 1857   ALKPHOS 132 08/24/2013 1610  ALKPHOS 113 02/24/2013 1857   AST 15 08/24/2013 0852   AST 18 02/24/2013 1857   ALT 26 08/24/2013 0852   ALT 30 02/24/2013 1857   BILITOT 0.45 08/24/2013 0852   BILITOT 0.2* 02/24/2013 1857      RADIOGRAPHIC STUDIES: No results found.   ASSESSMENT: 52 year old female with: #1  Patient was originally seen in the multidisciplinary breast clinic on 12/17/2012 with a screening mammogram that showed an abnormality in the right breast. This abnormality measured 1.6 cm. She had a biopsy performed at the 11:30 position that showed invasive ductal carcinoma grade 2, estrogen receptor negative, progesterone receptor negative, HER-2 positive with a Ki-67 of 83%.  #2  Status post right lumpectomy on 12/31/2012 with final pathology revealing a stage I, pT1c pN0 pMX, 8 cm invasive ductal carcinoma, grade 3 with lymphovascular invasion, estrogen receptor negative, PR negative, HER-2/neu positive with amplification of 4.36, Ki-67 83% 2 sentinel nodes were  negative for metastatic disease. Patient did not have positive margins and subsequently underwent right posterior margin, right anterior margin, and right anterior margin reexcision on 01/14/2013 that showed no residual tumor, and no atypia or malignancy.  Patient has a Port-A-Cath in place.  #3  The patient received with adjuvant chemotherapy initially consisting of Taxotere/Carboplatinum and Herceptin. Taxotere and Carboplatinum was given every 21 days and Herceptin on a weekly basis for a total of 4 cycles from 01/26/2013 - 03/30/2013.   Taxotere/Carboplatin was discontinued after 4 cycles due to worsening neuropathy.  Weekly Herceptin therapy was continued until 05/06/2013.  Currently receiving adjuvant Herceptin therapy every 3 weeks that started on 05/11/2013.    #4  The patient had radiation therapy from 05/13/2013 through 06/18/2013.  #5  Neuropathy--much improved, now off Gabapentin and Cymbalta  #6 Left upper extremity shoulder pain.   PLAN:  #1 Ms. Resetar will proceed with Herceptin therapy today.  She was evaluated and cleared to continue Herceptin therapy by Dr. Gala Romney on 09/09/13.  #2 Her neuropathy is much improved.  She will continue on Super B complex daily.    #3  We will get a left upper extremity doppler and shoulder xray to evaluate the pain.  Since she occasionally has pain down her arm and around her port site we need to ensure she doesn't have a thrombus.  We will also get an xray to evaluate the joint.    #4  She will return in three week's time for labs, evaluation, and Herceptin therapy.    All questions answered.  Ms. Berti was encouraged to contact us in the interim with any questions, concerns, or problems.  I spent 25 minutes counseling the patient face to face.  The total time spent in the appointment was 30 minutes.   Cherie Ouch Lyn Hollingshead, NP Medical Oncology [facility] Phone: [phone]

## 2013-09-14 NOTE — Telephone Encounter (Signed)
appts made and printed. Pt is aware that her tx will be added. i emailed MW to add the tx. Pt plans on going to get xray today...td

## 2013-09-14 NOTE — Patient Instructions (Addendum)
[facility] Discharge Instructions for Patients Receiving Chemotherapy  Today you received the following chemotherapy agents Herceptin.  To help prevent nausea and vomiting after your treatment, we encourage you to take your nausea medication as prescribed.   If you develop nausea and vomiting that is not controlled by your nausea medication, call the clinic.   BELOW ARE SYMPTOMS THAT SHOULD BE REPORTED IMMEDIATELY:  *FEVER GREATER THAN 100.5 F  *CHILLS WITH OR WITHOUT FEVER  NAUSEA AND VOMITING THAT IS NOT CONTROLLED WITH YOUR NAUSEA MEDICATION  *UNUSUAL SHORTNESS OF BREATH  *UNUSUAL BRUISING OR BLEEDING  TENDERNESS IN MOUTH AND THROAT WITH OR WITHOUT PRESENCE OF ULCERS  *URINARY PROBLEMS  *BOWEL PROBLEMS  UNUSUAL RASH Items with * indicate a potential emergency and should be followed up as soon as possible.  Feel free to call the clinic you have any questions or concerns. The clinic phone number is [phone].    

## 2013-09-14 NOTE — Patient Instructions (Signed)
Doing well.  We will get a doppler of the left arm and xray of the left shoulder to evaluate the pain.  Proceed with Herceptin.  Please call us if you have any questions or concerns.

## 2013-09-14 NOTE — Telephone Encounter (Signed)
Per staff message and POF I have scheduled appts.  JMW  

## 2013-09-14 NOTE — Progress Notes (Signed)
VASCULAR LAB PRELIMINARY  PRELIMINARY  PRELIMINARY  PRELIMINARY  Left upper extremity venous duplex completed.    Preliminary report:  Left:  No evidence of DVT or superficial thrombosis.    Marvon Shillingburg, RVT 09/14/2013, 12:12 PM

## 2013-09-15 ENCOUNTER — Encounter: Payer: Self-pay | Admitting: Adult Health

## 2013-09-16 ENCOUNTER — Ambulatory Visit: Payer: BC Managed Care – PPO | Admitting: Physical Therapy

## 2013-09-16 ENCOUNTER — Other Ambulatory Visit: Payer: Self-pay | Admitting: Certified Registered Nurse Anesthetist

## 2013-09-28 ENCOUNTER — Encounter: Payer: Self-pay | Admitting: Adult Health

## 2013-09-28 ENCOUNTER — Encounter: Payer: Self-pay | Admitting: Oncology

## 2013-09-29 ENCOUNTER — Telehealth: Payer: Self-pay | Admitting: *Deleted

## 2013-09-29 ENCOUNTER — Telehealth: Payer: Self-pay | Admitting: Oncology

## 2013-09-29 NOTE — Telephone Encounter (Signed)
Per staff message and POF I have scheduled appts.  JMW  

## 2013-10-05 ENCOUNTER — Ambulatory Visit: Payer: BC Managed Care – PPO | Admitting: Adult Health

## 2013-10-05 ENCOUNTER — Other Ambulatory Visit: Payer: BC Managed Care – PPO | Admitting: Lab

## 2013-10-05 ENCOUNTER — Ambulatory Visit: Payer: BC Managed Care – PPO

## 2013-10-07 ENCOUNTER — Ambulatory Visit (HOSPITAL_BASED_OUTPATIENT_CLINIC_OR_DEPARTMENT_OTHER): Payer: BC Managed Care – PPO | Admitting: Adult Health

## 2013-10-07 ENCOUNTER — Other Ambulatory Visit (HOSPITAL_BASED_OUTPATIENT_CLINIC_OR_DEPARTMENT_OTHER): Payer: BC Managed Care – PPO | Admitting: Lab

## 2013-10-07 ENCOUNTER — Ambulatory Visit (HOSPITAL_BASED_OUTPATIENT_CLINIC_OR_DEPARTMENT_OTHER): Payer: BC Managed Care – PPO

## 2013-10-07 ENCOUNTER — Telehealth: Payer: Self-pay | Admitting: Oncology

## 2013-10-07 ENCOUNTER — Telehealth: Payer: Self-pay | Admitting: *Deleted

## 2013-10-07 ENCOUNTER — Encounter: Payer: Self-pay | Admitting: Adult Health

## 2013-10-07 VITALS — BP 134/90 | HR 79 | Temp 98.8°F | Resp 18 | Ht 65.0 in | Wt 242.9 lb

## 2013-10-07 DIAGNOSIS — C50419 Malignant neoplasm of upper-outer quadrant of unspecified female breast: Secondary | ICD-10-CM

## 2013-10-07 DIAGNOSIS — G609 Hereditary and idiopathic neuropathy, unspecified: Secondary | ICD-10-CM

## 2013-10-07 DIAGNOSIS — C50411 Malignant neoplasm of upper-outer quadrant of right female breast: Secondary | ICD-10-CM

## 2013-10-07 DIAGNOSIS — Z5112 Encounter for antineoplastic immunotherapy: Secondary | ICD-10-CM

## 2013-10-07 DIAGNOSIS — C50919 Malignant neoplasm of unspecified site of unspecified female breast: Secondary | ICD-10-CM

## 2013-10-07 LAB — CBC WITH DIFFERENTIAL/PLATELET
BASO%: 0.5 % (ref 0.0–2.0)
EOS%: 1.5 % (ref 0.0–7.0)
HCT: 33.7 % — ABNORMAL LOW (ref 34.8–46.6)
LYMPH%: 27.2 % (ref 14.0–49.7)
MCH: 25.1 pg (ref 25.1–34.0)
MCHC: 32 g/dL (ref 31.5–36.0)
MCV: 78.2 fL — ABNORMAL LOW (ref 79.5–101.0)
MONO#: 0.5 10*3/uL (ref 0.1–0.9)
MONO%: 6.4 % (ref 0.0–14.0)
NEUT%: 64.4 % (ref 38.4–76.8)
Platelets: 180 10*3/uL (ref 145–400)
RBC: 4.31 10*6/uL (ref 3.70–5.45)
RDW: 14 % (ref 11.2–14.5)
WBC: 7.3 10*3/uL (ref 3.9–10.3)
nRBC: 0 % (ref 0–0)

## 2013-10-07 LAB — COMPREHENSIVE METABOLIC PANEL (CC13)
ALT: 25 U/L (ref 0–55)
AST: 17 U/L (ref 5–34)
Alkaline Phosphatase: 135 U/L (ref 40–150)
Chloride: 109 mEq/L (ref 98–109)
Creatinine: 0.7 mg/dL (ref 0.6–1.1)
Total Bilirubin: 0.42 mg/dL (ref 0.20–1.20)

## 2013-10-07 MED ORDER — TRASTUZUMAB CHEMO INJECTION 440 MG
6.0000 mg/kg | Freq: Once | INTRAVENOUS | Status: AC
  Administered 2013-10-07: 630 mg via INTRAVENOUS
  Filled 2013-10-07: qty 30

## 2013-10-07 MED ORDER — HEPARIN SOD (PORK) LOCK FLUSH 100 UNIT/ML IV SOLN
500.0000 [IU] | Freq: Once | INTRAVENOUS | Status: AC | PRN
  Administered 2013-10-07: 500 [IU]
  Filled 2013-10-07: qty 5

## 2013-10-07 MED ORDER — SODIUM CHLORIDE 0.9 % IV SOLN
Freq: Once | INTRAVENOUS | Status: AC
  Administered 2013-10-07: 13:00:00 via INTRAVENOUS

## 2013-10-07 MED ORDER — ACETAMINOPHEN 325 MG PO TABS
650.0000 mg | ORAL_TABLET | Freq: Once | ORAL | Status: AC
  Administered 2013-10-07: 650 mg via ORAL

## 2013-10-07 MED ORDER — SODIUM CHLORIDE 0.9 % IJ SOLN
10.0000 mL | INTRAMUSCULAR | Status: DC | PRN
  Administered 2013-10-07: 10 mL
  Filled 2013-10-07: qty 10

## 2013-10-07 MED ORDER — ACETAMINOPHEN 325 MG PO TABS
ORAL_TABLET | ORAL | Status: AC
  Filled 2013-10-07: qty 2

## 2013-10-07 NOTE — Telephone Encounter (Signed)
, °

## 2013-10-07 NOTE — Patient Instructions (Signed)

## 2013-10-07 NOTE — Progress Notes (Signed)
[facility]  Telephone:(336) [phone] Fax:(336) [phone]  OFFICE PROGRESS NOTE    CC: Tonye Pearson, MD [address] [place] [address] GYN:  Richarda Overlie, MD SU:  Almond Lint, MD RAD ONC:  Lurline Hare, MD   DIAGNOSIS: 52 year old woman with invasive ductal carcinoma of the right breast diagnosed in 11/2012.   PRIOR THERAPY: #1  Patient was originally seen in the multidisciplinary breast clinic on 12/17/2012 with a screening mammogram that showed an abnormality in the right breast. This abnormality measured 1.6 cm. She had a biopsy performed at the 11:30 position that showed invasive ductal carcinoma grade 2, estrogen receptor negative, progesterone receptor negative, HER-2 positive with a Ki-67 of 83%.  #2  Status post right lumpectomy on 12/31/2012 with final pathology revealing a stage I, pT1c pN0 pMX, 8 cm invasive ductal carcinoma, grade 3 with lymphovascular invasion, estrogen receptor negative, PR negative, HER-2/neu positive with amplification of 4.36, Ki-67 83% 2 sentinel nodes were negative for metastatic disease. Patient did not have positive margins and subsequently underwent right posterior margin, right anterior margin, and right anterior margin reexcision on 01/14/2013 that showed no residual tumor, and no atypia or malignancy.  Patient has a Port-A-Cath in place.  #3  The patient received with adjuvant chemotherapy initially consisting of Taxotere/Carboplatinum and Herceptin. Taxotere and Carboplatinum was given every 21 days and Herceptin on a weekly basis for a total of 4 cycles from 01/26/2013 - 03/30/2013.   Taxotere/Carboplatin was discontinued after 4 cycles due to worsening neuropathy.  Weekly Herceptin therapy was continued until 05/06/2013.  Currently receiving adjuvant Herceptin therapy every 3 weeks that started on 05/11/2013.    #4  The patient had radiation therapy from 05/13/2013 through 06/18/2013.   CURRENT THERAPY:  Herceptin every 3 weeks.   INTERVAL HISTORY: Julia Zuniga 52 y.o. female returns for followup today prior to herceptin therapy.   She is doing well today.  She denies fevers, chills, nausea, vomiting, chest pain, swelling, DOE, PND, orthopnea, or any other concerns.  She has occasional joint pain that is relieved with tylenol or ibuprofen.  Otherwise, a 10 point ROS is negative.     MEDICAL HISTORY: Past Medical History  Diagnosis Date  . Hyperlipidemia   . Diverticulitis   . Hernia, hiatal   . Heartburn   . Anxiety   . Hot flashes   . PONV (postoperative nausea and vomiting)   . Breast cancer 12/08/12    Right  . History of radiation therapy 05/13/13-06/18/13    right breast, 5000 cGy 25 sessions     ALLERGIES:  is allergic to chlorhexidine; codeine; medralone; and oxycodone-acetaminophen.  MEDICATIONS:  Current Outpatient Prescriptions  Medication Sig Dispense Refill  . atorvastatin (LIPITOR) 40 MG tablet Take 1 tablet (40 mg total) by mouth every evening.  30 tablet  3  . B Complex-C (SUPER B COMPLEX PO) Take 1 tablet by mouth daily.      . carvedilol (COREG) 6.25 MG tablet Take 1 tablet (6.25 mg total) by mouth 2 (two) times daily with a meal.  60 tablet  3  . Cholecalciferol (D3-1000) 1000 UNITS capsule Take 1,000 Units by mouth daily.      Marland Kitchen HYDROcodone-acetaminophen (NORCO) 5-325 MG per tablet Take 1-2 tablets by mouth every 6 (six) hours as needed for pain.  30 tablet  1  . ibuprofen (ADVIL,MOTRIN) 200 MG tablet Take 400 mg by mouth every 6 (six) hours as needed. Pain      .  Multiple Vitamins-Minerals (MULTIVITAMIN WITH MINERALS) tablet Take 1 tablet by mouth daily.      Marland Kitchen omeprazole (PRILOSEC) 40 MG capsule Take 1 capsule (40 mg total) by mouth daily.  90 capsule  2  . Probiotic Product (PROBIOTIC DAILY PO) Take 1 tablet by mouth daily.      . vitamin B-12 (CYANOCOBALAMIN) 100 MCG tablet Take 50 mcg by mouth daily.       No current facility-administered medications  for this visit.    SURGICAL HISTORY:  Past Surgical History  Procedure Laterality Date  . Cesarean section      x 3  . Tubal ligation    . Tonsillectomy and adenoidectomy    . Cholecystectomy  2001    lap choli  . Breast lumpectomy with sentinel lymph node biopsy  12/31/2012    Procedure: BREAST LUMPECTOMY WITH SENTINEL LYMPH NODE BX;  Surgeon: Almond Lint, MD;  Location: [facility];  Service: General;  Laterality: Right;  . Portacath placement  12/31/2012    Procedure: INSERTION PORT-A-CATH;  Surgeon: Almond Lint, MD;  Location: [facility];  Service: General;  Laterality: Left;  Left Subclavian Vein  . Re-excision of breast cancer,superior margins  01/14/2013    Procedure: RE-EXCISION OF BREAST CANCER,SUPERIOR MARGINS;  Surgeon: Almond Lint, MD;  Location: [facility];  Service: General;  Laterality: Right;  right breast re excision for markers     REVIEW OF SYSTEMS:   A 10 point review of systems was completed and is negative except as noted in the interval history.  PHYSICAL EXAMINATION: Blood pressure 134/90, pulse 79, temperature 98.8 F (37.1 C), temperature source Oral, resp. rate 18, height 5\' 5"  (1.651 m), weight 242 lb 14.4 oz (110.179 kg), last menstrual period 01/07/2009. Body mass index is 40.42 kg/(m^2). General: Patient is a well appearing female in no acute distress HEENT: PERRLA, sclerae anicteric no conjunctival pallor, MMM Neck: supple, no palpable adenopathy Lungs: clear to auscultation bilaterally, no wheezes, rhonchi, or rales Cardiovascular: regular rate rhythm, S1, S2, no murmurs, rubs or gallops Abdomen: Soft, non-tender, non-distended, normoactive bowel sounds, no HSM Extremities: warm and well perfused, no clubbing, cyanosis, or edema, left shoulder no point tenderness Skin: No rashes or lesions Neuro: Non-focal Breasts: deferred ECOG PERFORMANCE STATUS: 1 - Symptomatic but completely ambulatory   LABORATORY DATA: Lab Results   Component Value Date   WBC 7.3 10/07/2013   HGB 10.8* 10/07/2013   HCT 33.7* 10/07/2013   MCV 78.2* 10/07/2013   PLT 180 10/07/2013      Chemistry      Component Value Date/Time   NA 144 10/07/2013 1057   NA 135 02/24/2013 1857   K 4.1 10/07/2013 1057   K 3.4* 02/24/2013 1857   CL 106 06/01/2013 1045   CL 97 02/24/2013 1857   CO2 25 10/07/2013 1057   CO2 29 02/24/2013 1857   BUN 10.3 10/07/2013 1057   BUN 6 02/24/2013 1857   CREATININE 0.7 10/07/2013 1057   CREATININE 0.55 02/24/2013 1857      Component Value Date/Time   CALCIUM 9.9 10/07/2013 1057   CALCIUM 9.2 02/24/2013 1857   ALKPHOS 135 10/07/2013 1057   ALKPHOS 113 02/24/2013 1857   AST 17 10/07/2013 1057   AST 18 02/24/2013 1857   ALT 25 10/07/2013 1057   ALT 30 02/24/2013 1857   BILITOT 0.42 10/07/2013 1057   BILITOT 0.2* 02/24/2013 1857      RADIOGRAPHIC STUDIES: No results found.   ASSESSMENT: 52 year old  female with: #1  Patient was originally seen in the multidisciplinary breast clinic on 12/17/2012 with a screening mammogram that showed an abnormality in the right breast. This abnormality measured 1.6 cm. She had a biopsy performed at the 11:30 position that showed invasive ductal carcinoma grade 2, estrogen receptor negative, progesterone receptor negative, HER-2 positive with a Ki-67 of 83%.  #2  Status post right lumpectomy on 12/31/2012 with final pathology revealing a stage I, pT1c pN0 pMX, 8 cm invasive ductal carcinoma, grade 3 with lymphovascular invasion, estrogen receptor negative, PR negative, HER-2/neu positive with amplification of 4.36, Ki-67 83% 2 sentinel nodes were negative for metastatic disease. Patient did not have positive margins and subsequently underwent right posterior margin, right anterior margin, and right anterior margin reexcision on 01/14/2013 that showed no residual tumor, and no atypia or malignancy.  Patient has a Port-A-Cath in place.  #3  The patient received with adjuvant  chemotherapy initially consisting of Taxotere/Carboplatinum and Herceptin. Taxotere and Carboplatinum was given every 21 days and Herceptin on a weekly basis for a total of 4 cycles from 01/26/2013 - 03/30/2013.   Taxotere/Carboplatin was discontinued after 4 cycles due to worsening neuropathy.  Weekly Herceptin therapy was continued until 05/06/2013.  Currently receiving adjuvant Herceptin therapy every 3 weeks that started on 05/11/2013.    #4  The patient had radiation therapy from 05/13/2013 through 06/18/2013.  #5  Neuropathy--much improved, now off Gabapentin and Cymbalta  #6 Left upper extremity shoulder pain, she was offered physical therapy, but declined this.     PLAN:  #1 Julia Zuniga is doing well and will proceed with Herceptin therapy today.  She was evaluated and cleared to continue Herceptin therapy by Dr. Gala Romney on 09/09/13. I reviewed her labs with her in detail.    #2 Her neuropathy is much improved.  She will continue on Super B complex daily.    #3   She will return in three week's time for labs, evaluation, and Herceptin therapy.    All questions answered.  Ms. Hedding was encouraged to contact us in the interim with any questions, concerns, or problems.  I spent 15 minutes counseling the patient face to face.  The total time spent in the appointment was 30 minutes.   Illa Level, NP Medical Oncology [facility] [phone]

## 2013-10-07 NOTE — Telephone Encounter (Signed)
Per staff message and POF I have scheduled appts.  JMW  

## 2013-10-15 ENCOUNTER — Other Ambulatory Visit: Payer: Self-pay

## 2013-10-26 ENCOUNTER — Ambulatory Visit (HOSPITAL_BASED_OUTPATIENT_CLINIC_OR_DEPARTMENT_OTHER): Payer: BC Managed Care – PPO

## 2013-10-26 ENCOUNTER — Encounter: Payer: Self-pay | Admitting: Adult Health

## 2013-10-26 ENCOUNTER — Telehealth: Payer: Self-pay | Admitting: *Deleted

## 2013-10-26 ENCOUNTER — Other Ambulatory Visit (HOSPITAL_BASED_OUTPATIENT_CLINIC_OR_DEPARTMENT_OTHER): Payer: BC Managed Care – PPO | Admitting: Lab

## 2013-10-26 ENCOUNTER — Telehealth: Payer: Self-pay | Admitting: Oncology

## 2013-10-26 ENCOUNTER — Ambulatory Visit (HOSPITAL_BASED_OUTPATIENT_CLINIC_OR_DEPARTMENT_OTHER): Payer: BC Managed Care – PPO | Admitting: Adult Health

## 2013-10-26 VITALS — BP 130/88 | HR 71 | Temp 98.5°F | Resp 20 | Ht 65.0 in | Wt 236.4 lb

## 2013-10-26 DIAGNOSIS — D509 Iron deficiency anemia, unspecified: Secondary | ICD-10-CM

## 2013-10-26 DIAGNOSIS — Z171 Estrogen receptor negative status [ER-]: Secondary | ICD-10-CM

## 2013-10-26 DIAGNOSIS — C50411 Malignant neoplasm of upper-outer quadrant of right female breast: Secondary | ICD-10-CM

## 2013-10-26 DIAGNOSIS — C50419 Malignant neoplasm of upper-outer quadrant of unspecified female breast: Secondary | ICD-10-CM

## 2013-10-26 DIAGNOSIS — C50919 Malignant neoplasm of unspecified site of unspecified female breast: Secondary | ICD-10-CM

## 2013-10-26 DIAGNOSIS — G609 Hereditary and idiopathic neuropathy, unspecified: Secondary | ICD-10-CM

## 2013-10-26 DIAGNOSIS — Z5112 Encounter for antineoplastic immunotherapy: Secondary | ICD-10-CM

## 2013-10-26 LAB — COMPREHENSIVE METABOLIC PANEL (CC13)
Albumin: 4.2 g/dL (ref 3.5–5.0)
Alkaline Phosphatase: 116 U/L (ref 40–150)
BUN: 12.8 mg/dL (ref 7.0–26.0)
CO2: 23 mEq/L (ref 22–29)
Glucose: 96 mg/dl (ref 70–140)
Potassium: 4 mEq/L (ref 3.5–5.1)

## 2013-10-26 LAB — CBC WITH DIFFERENTIAL/PLATELET
Basophils Absolute: 0.1 10*3/uL (ref 0.0–0.1)
EOS%: 2.2 % (ref 0.0–7.0)
HCT: 33.9 % — ABNORMAL LOW (ref 34.8–46.6)
HGB: 10.9 g/dL — ABNORMAL LOW (ref 11.6–15.9)
MCH: 25.1 pg (ref 25.1–34.0)
MCV: 78.1 fL — ABNORMAL LOW (ref 79.5–101.0)
MONO%: 5.1 % (ref 0.0–14.0)
NEUT%: 59.3 % (ref 38.4–76.8)
Platelets: 200 10*3/uL (ref 145–400)
lymph#: 2.1 10*3/uL (ref 0.9–3.3)
nRBC: 0 % (ref 0–0)

## 2013-10-26 LAB — FERRITIN CHCC: Ferritin: 113 ng/ml (ref 9–269)

## 2013-10-26 LAB — IRON AND TIBC CHCC
%SAT: 15 % — ABNORMAL LOW (ref 21–57)
Iron: 43 ug/dL (ref 41–142)
TIBC: 285 ug/dL (ref 236–444)

## 2013-10-26 MED ORDER — HEPARIN SOD (PORK) LOCK FLUSH 100 UNIT/ML IV SOLN
500.0000 [IU] | Freq: Once | INTRAVENOUS | Status: AC | PRN
  Administered 2013-10-26: 500 [IU]
  Filled 2013-10-26: qty 5

## 2013-10-26 MED ORDER — ACETAMINOPHEN 325 MG PO TABS
650.0000 mg | ORAL_TABLET | Freq: Once | ORAL | Status: AC
  Administered 2013-10-26: 650 mg via ORAL

## 2013-10-26 MED ORDER — TRASTUZUMAB CHEMO INJECTION 440 MG
6.0000 mg/kg | Freq: Once | INTRAVENOUS | Status: AC
  Administered 2013-10-26: 630 mg via INTRAVENOUS
  Filled 2013-10-26: qty 30

## 2013-10-26 MED ORDER — ACETAMINOPHEN 325 MG PO TABS
ORAL_TABLET | ORAL | Status: AC
  Filled 2013-10-26: qty 2

## 2013-10-26 MED ORDER — SODIUM CHLORIDE 0.9 % IJ SOLN
10.0000 mL | INTRAMUSCULAR | Status: DC | PRN
  Administered 2013-10-26: 10 mL
  Filled 2013-10-26: qty 10

## 2013-10-26 MED ORDER — SODIUM CHLORIDE 0.9 % IV SOLN
Freq: Once | INTRAVENOUS | Status: AC
  Administered 2013-10-26: 11:00:00 via INTRAVENOUS

## 2013-10-26 NOTE — Telephone Encounter (Signed)
, °

## 2013-10-26 NOTE — Progress Notes (Addendum)
[facility]  Telephone:(336) [phone] Fax:(336) [phone]  OFFICE PROGRESS NOTE    CC: Tonye Pearson, MD [address] [place] [address] GYN:  Richarda Overlie, MD SU:  Almond Lint, MD RAD ONC:  Lurline Hare, MD   DIAGNOSIS: 52 year old woman with invasive ductal carcinoma of the right breast diagnosed in 11/2012.   PRIOR THERAPY: #1  Patient was originally seen in the multidisciplinary breast clinic on 12/17/2012 with a screening mammogram that showed an abnormality in the right breast. This abnormality measured 1.6 cm. She had a biopsy performed at the 11:30 position that showed invasive ductal carcinoma grade 2, estrogen receptor negative, progesterone receptor negative, HER-2 positive with a Ki-67 of 83%.  #2  Status post right lumpectomy on 12/31/2012 with final pathology revealing a stage I, pT1c pN0 pMX, 8 cm invasive ductal carcinoma, grade 3 with lymphovascular invasion, estrogen receptor negative, PR negative, HER-2/neu positive with amplification of 4.36, Ki-67 83% 2 sentinel nodes were negative for metastatic disease. Patient did not have positive margins and subsequently underwent right posterior margin, right anterior margin, and right anterior margin reexcision on 01/14/2013 that showed no residual tumor, and no atypia or malignancy.  Patient has a Port-A-Cath in place.  #3  The patient received with adjuvant chemotherapy initially consisting of Taxotere/Carboplatinum and Herceptin. Taxotere and Carboplatinum was given every 21 days and Herceptin on a weekly basis for a total of 4 cycles from 01/26/2013 - 03/30/2013.   Taxotere/Carboplatin was discontinued after 4 cycles due to worsening neuropathy.  Weekly Herceptin therapy was continued until 05/06/2013.  Currently receiving adjuvant Herceptin therapy every 3 weeks that started on 05/11/2013.    #4  The patient had radiation therapy from 05/13/2013 through 06/18/2013.   CURRENT THERAPY:  Herceptin every 3 weeks.   INTERVAL HISTORY: Julia Zuniga 52 y.o. female returns for followup today prior to herceptin therapy.   She is doing well today.  She continues to have a microcytic anemia and is worried about her iron levels.  She has changed her diet, and has lost 6 pounds in two weeks.  Otherwise, a 10 point ROS is neg.   MEDICAL HISTORY: Past Medical History  Diagnosis Date  . Hyperlipidemia   . Diverticulitis   . Hernia, hiatal   . Heartburn   . Anxiety   . Hot flashes   . PONV (postoperative nausea and vomiting)   . Breast cancer 12/08/12    Right  . History of radiation therapy 05/13/13-06/18/13    right breast, 5000 cGy 25 sessions     ALLERGIES:  is allergic to chlorhexidine; codeine; medralone; and oxycodone-acetaminophen.  MEDICATIONS:  Current Outpatient Prescriptions  Medication Sig Dispense Refill  . atorvastatin (LIPITOR) 40 MG tablet Take 1 tablet (40 mg total) by mouth every evening.  30 tablet  3  . carvedilol (COREG) 6.25 MG tablet Take 1 tablet (6.25 mg total) by mouth 2 (two) times daily with a meal.  60 tablet  3  . Cholecalciferol (D3-1000) 1000 UNITS capsule Take 1,000 Units by mouth daily.      Marland Kitchen omeprazole (PRILOSEC) 40 MG capsule Take 1 capsule (40 mg total) by mouth daily.  90 capsule  2  . Probiotic Product (PROBIOTIC DAILY PO) Take 1 tablet by mouth daily.      . B Complex-C (SUPER B COMPLEX PO) Take 1 tablet by mouth daily.      Marland Kitchen HYDROcodone-acetaminophen (NORCO) 5-325 MG per tablet Take 1-2 tablets by mouth every 6 (six)  hours as needed for pain.  30 tablet  1  . ibuprofen (ADVIL,MOTRIN) 200 MG tablet Take 400 mg by mouth every 6 (six) hours as needed. Pain      . Multiple Vitamins-Minerals (MULTIVITAMIN WITH MINERALS) tablet Take 1 tablet by mouth daily.      . vitamin B-12 (CYANOCOBALAMIN) 100 MCG tablet Take 50 mcg by mouth daily.       No current facility-administered medications for this visit.    SURGICAL HISTORY:  Past  Surgical History  Procedure Laterality Date  . Cesarean section      x 3  . Tubal ligation    . Tonsillectomy and adenoidectomy    . Cholecystectomy  2001    lap choli  . Breast lumpectomy with sentinel lymph node biopsy  12/31/2012    Procedure: BREAST LUMPECTOMY WITH SENTINEL LYMPH NODE BX;  Surgeon: Almond Lint, MD;  Location: [facility];  Service: General;  Laterality: Right;  . Portacath placement  12/31/2012    Procedure: INSERTION PORT-A-CATH;  Surgeon: Almond Lint, MD;  Location: [facility];  Service: General;  Laterality: Left;  Left Subclavian Vein  . Re-excision of breast cancer,superior margins  01/14/2013    Procedure: RE-EXCISION OF BREAST CANCER,SUPERIOR MARGINS;  Surgeon: Almond Lint, MD;  Location: [facility];  Service: General;  Laterality: Right;  right breast re excision for markers     REVIEW OF SYSTEMS:   A 10 point review of systems was completed and is negative except as noted in the interval history.  PHYSICAL EXAMINATION: Blood pressure 130/88, pulse 71, temperature 98.5 F (36.9 C), temperature source Oral, resp. rate 20, height 5\' 5"  (1.651 m), weight 236 lb 6.4 oz (107.23 kg), last menstrual period 01/07/2009. Body mass index is 39.34 kg/(m^2). General: Patient is a well appearing female in no acute distress HEENT: PERRLA, sclerae anicteric no conjunctival pallor, MMM Neck: supple, no palpable adenopathy Lungs: clear to auscultation bilaterally, no wheezes, rhonchi, or rales Cardiovascular: regular rate rhythm, S1, S2, no murmurs, rubs or gallops Abdomen: Soft, non-tender, non-distended, normoactive bowel sounds, no HSM Extremities: warm and well perfused, no clubbing, cyanosis, or edema, left shoulder no point tenderness Skin: No rashes or lesions Neuro: Non-focal Breasts: right lumpectomy without nodularity. Bilateral Breasts without masses, nodules, skin changes, or sign of recurrence. ECOG PERFORMANCE STATUS: 1 - Symptomatic  but completely ambulatory   LABORATORY DATA: Lab Results  Component Value Date   WBC 6.5 10/26/2013   HGB 10.9* 10/26/2013   HCT 33.9* 10/26/2013   MCV 78.1* 10/26/2013   PLT 200 10/26/2013      Chemistry      Component Value Date/Time   NA 142 10/26/2013 0850   NA 135 02/24/2013 1857   K 4.0 10/26/2013 0850   K 3.4* 02/24/2013 1857   CL 106 06/01/2013 1045   CL 97 02/24/2013 1857   CO2 23 10/26/2013 0850   CO2 29 02/24/2013 1857   BUN 12.8 10/26/2013 0850   BUN 6 02/24/2013 1857   CREATININE 0.6 10/26/2013 0850   CREATININE 0.55 02/24/2013 1857      Component Value Date/Time   CALCIUM 9.9 10/26/2013 0850   CALCIUM 9.2 02/24/2013 1857   ALKPHOS 116 10/26/2013 0850   ALKPHOS 113 02/24/2013 1857   AST 22 10/26/2013 0850   AST 18 02/24/2013 1857   ALT 33 10/26/2013 0850   ALT 30 02/24/2013 1857   BILITOT 0.40 10/26/2013 0850   BILITOT 0.2* 02/24/2013 1857  RADIOGRAPHIC STUDIES: No results found.   ASSESSMENT: 52 year old female with: #1  Patient was originally seen in the multidisciplinary breast clinic on 12/17/2012 with a screening mammogram that showed an abnormality in the right breast. This abnormality measured 1.6 cm. She had a biopsy performed at the 11:30 position that showed invasive ductal carcinoma grade 2, estrogen receptor negative, progesterone receptor negative, HER-2 positive with a Ki-67 of 83%.  #2  Status post right lumpectomy on 12/31/2012 with final pathology revealing a stage I, pT1c pN0 pMX, 8 cm invasive ductal carcinoma, grade 3 with lymphovascular invasion, estrogen receptor negative, PR negative, HER-2/neu positive with amplification of 4.36, Ki-67 83% 2 sentinel nodes were negative for metastatic disease. Patient did not have positive margins and subsequently underwent right posterior margin, right anterior margin, and right anterior margin reexcision on 01/14/2013 that showed no residual tumor, and no atypia or malignancy.  Patient has a Port-A-Cath  in place.  #3  The patient received with adjuvant chemotherapy initially consisting of Taxotere/Carboplatinum and Herceptin. Taxotere and Carboplatinum was given every 21 days and Herceptin on a weekly basis for a total of 4 cycles from 01/26/2013 - 03/30/2013.   Taxotere/Carboplatin was discontinued after 4 cycles due to worsening neuropathy.  Weekly Herceptin therapy was continued until 05/06/2013.  Currently receiving adjuvant Herceptin therapy every 3 weeks that started on 05/11/2013.    #4  The patient had radiation therapy from 05/13/2013 through 06/18/2013.  #5  Neuropathy--much improved, now off Gabapentin and Cymbalta  #6 Left upper extremity shoulder pain, she was offered physical therapy, but declined this.     PLAN:  #1 Ms. Rickles is doing well and will proceed with Herceptin therapy today.  She was evaluated and cleared to continue Herceptin therapy by Dr. Gala Romney on 09/09/13. I reviewed her labs with her in detail.  We will add on iron studies today to her labs.    #2 Her neuropathy is 95% gone according to her.  She has stopped taking super b complex along with Gabapentin and Cymbalta.    #3   She will return in three week's time for labs, evaluation, and Herceptin therapy.    All questions answered.  Ms. Skufca was encouraged to contact us in the interim with any questions, concerns, or problems.  I spent 15 minutes counseling the patient face to face.  The total time spent in the appointment was 30 minutes.   Illa Level, NP Medical Oncology [facility] [phone]  ATTENDING'S ATTESTATION:  I personally reviewed patient's chart, examined patient myself, formulated the treatment plan as followed.    52 year old female with history of invasive ductal carcinoma of the right breast diagnosed in December 2013. Patient's tumor measured 1.6 cm. Stage I. He was ER negative PR negative HER-2/neu positive. Patient underwent a lumpectomyin January  2014. She subsequently received adjuvant chemotherapy consisting of Taxotere carboplatinum and Herceptin. This was all completed in April 2014 4 total of 4 cycles. But because patient had worsening neuropathy due to the Taxotere. Her treatment was then switched to Herceptin every 3 weeks starting 05/11/2013. She has gone on to complete radiation therapy on 06/18/2013.  Patient is currently receiving Herceptin every 3 weeks. She is here to receive her subsequent cycles. She overall is doing well feeling well. She will proceed with her therapy.  Drue Second, MD Medical/Oncology [facility] [phone] (beeper) [phone] (Office)  11/15/2013, 10:07 PM

## 2013-10-26 NOTE — Patient Instructions (Signed)
[facility] Discharge Instructions for Patients Receiving Chemotherapy  Today you received the following chemotherapy(antibody) agent: Herceptin (trastuzumab)   If you develop nausea and vomiting, call the clinic.  BELOW ARE SYMPTOMS THAT SHOULD BE REPORTED IMMEDIATELY:  *FEVER GREATER THAN 100.5 F  *CHILLS WITH OR WITHOUT FEVER  NAUSEA AND VOMITING THAT IS NOT CONTROLLED WITH YOUR NAUSEA MEDICATION  *UNUSUAL SHORTNESS OF BREATH  *UNUSUAL BRUISING OR BLEEDING  TENDERNESS IN MOUTH AND THROAT WITH OR WITHOUT PRESENCE OF ULCERS  *URINARY PROBLEMS  *BOWEL PROBLEMS  UNUSUAL RASH Items with * indicate a potential emergency and should be followed up as soon as possible.  Feel free to call the clinic you have any questions or concerns. The clinic phone number is [phone].  It has been a pleasure to serve you today!

## 2013-10-26 NOTE — Progress Notes (Signed)
Patient declined po Benadryl at treatment today. Confirmed last ECHO @ 55% on 09/09/13

## 2013-10-26 NOTE — Telephone Encounter (Signed)
Per staff message and POF I have scheduled appts.  JMW  

## 2013-10-28 ENCOUNTER — Other Ambulatory Visit: Payer: Self-pay | Admitting: *Deleted

## 2013-10-28 DIAGNOSIS — IMO0001 Reserved for inherently not codable concepts without codable children: Secondary | ICD-10-CM

## 2013-10-28 MED ORDER — OMEPRAZOLE 40 MG PO CPDR: 40.0000 mg | DELAYED_RELEASE_CAPSULE | Freq: Every day | ORAL | Status: DC

## 2013-11-02 ENCOUNTER — Telehealth: Payer: Self-pay | Admitting: *Deleted

## 2013-11-02 NOTE — Telephone Encounter (Signed)
she can take a good multivitamin with iron in it

## 2013-11-02 NOTE — Telephone Encounter (Signed)
Per MD notified pt to take goo multivitamin with iron in it. Pt verbalized understanding. No further concerns.

## 2013-11-02 NOTE — Telephone Encounter (Signed)
Pt called states " I saw my iron results on mychart and inquired if she should take iron supplements" discussed with pt I will review information/concern with MD and call her back.

## 2013-11-16 ENCOUNTER — Ambulatory Visit (HOSPITAL_BASED_OUTPATIENT_CLINIC_OR_DEPARTMENT_OTHER): Payer: BC Managed Care – PPO | Admitting: Adult Health

## 2013-11-16 ENCOUNTER — Other Ambulatory Visit (HOSPITAL_BASED_OUTPATIENT_CLINIC_OR_DEPARTMENT_OTHER): Payer: BC Managed Care – PPO | Admitting: Lab

## 2013-11-16 ENCOUNTER — Telehealth: Payer: Self-pay | Admitting: *Deleted

## 2013-11-16 ENCOUNTER — Ambulatory Visit (HOSPITAL_BASED_OUTPATIENT_CLINIC_OR_DEPARTMENT_OTHER): Payer: BC Managed Care – PPO

## 2013-11-16 ENCOUNTER — Encounter: Payer: Self-pay | Admitting: Adult Health

## 2013-11-16 VITALS — BP 116/82 | HR 64 | Temp 98.2°F | Resp 18 | Ht 65.0 in | Wt 233.6 lb

## 2013-11-16 DIAGNOSIS — M79609 Pain in unspecified limb: Secondary | ICD-10-CM

## 2013-11-16 DIAGNOSIS — C50411 Malignant neoplasm of upper-outer quadrant of right female breast: Secondary | ICD-10-CM

## 2013-11-16 DIAGNOSIS — C50419 Malignant neoplasm of upper-outer quadrant of unspecified female breast: Secondary | ICD-10-CM

## 2013-11-16 DIAGNOSIS — Z5112 Encounter for antineoplastic immunotherapy: Secondary | ICD-10-CM

## 2013-11-16 DIAGNOSIS — G609 Hereditary and idiopathic neuropathy, unspecified: Secondary | ICD-10-CM

## 2013-11-16 LAB — CBC WITH DIFFERENTIAL/PLATELET
BASO%: 1.1 % (ref 0.0–2.0)
EOS%: 2.2 % (ref 0.0–7.0)
HCT: 35.4 % (ref 34.8–46.6)
LYMPH%: 32.8 % (ref 14.0–49.7)
MCH: 25.2 pg (ref 25.1–34.0)
MCHC: 31.9 g/dL (ref 31.5–36.0)
MONO#: 0.4 10*3/uL (ref 0.1–0.9)
MONO%: 5.5 % (ref 0.0–14.0)
NEUT%: 58.4 % (ref 38.4–76.8)
Platelets: 191 10*3/uL (ref 145–400)
RBC: 4.49 10*6/uL (ref 3.70–5.45)
RDW: 13.2 % (ref 11.2–14.5)
lymph#: 2.3 10*3/uL (ref 0.9–3.3)
nRBC: 0 % (ref 0–0)

## 2013-11-16 LAB — COMPREHENSIVE METABOLIC PANEL (CC13)
ALT: 30 U/L (ref 0–55)
AST: 17 U/L (ref 5–34)
Albumin: 4.2 g/dL (ref 3.5–5.0)
Alkaline Phosphatase: 119 U/L (ref 40–150)
BUN: 13.1 mg/dL (ref 7.0–26.0)
Creatinine: 0.7 mg/dL (ref 0.6–1.1)
Sodium: 143 mEq/L (ref 136–145)

## 2013-11-16 MED ORDER — TRASTUZUMAB CHEMO INJECTION 440 MG
6.0000 mg/kg | Freq: Once | INTRAVENOUS | Status: AC
  Administered 2013-11-16: 630 mg via INTRAVENOUS
  Filled 2013-11-16: qty 30

## 2013-11-16 MED ORDER — SODIUM CHLORIDE 0.9 % IV SOLN
Freq: Once | INTRAVENOUS | Status: AC
  Administered 2013-11-16: 10:00:00 via INTRAVENOUS

## 2013-11-16 MED ORDER — ACETAMINOPHEN 325 MG PO TABS
650.0000 mg | ORAL_TABLET | Freq: Once | ORAL | Status: AC
  Administered 2013-11-16: 650 mg via ORAL

## 2013-11-16 MED ORDER — SODIUM CHLORIDE 0.9 % IJ SOLN
10.0000 mL | INTRAMUSCULAR | Status: DC | PRN
Start: 2013-11-16 — End: 2013-11-16
  Administered 2013-11-16: 10 mL
  Filled 2013-11-16: qty 10

## 2013-11-16 MED ORDER — HEPARIN SOD (PORK) LOCK FLUSH 100 UNIT/ML IV SOLN
500.0000 [IU] | Freq: Once | INTRAVENOUS | Status: AC | PRN
  Administered 2013-11-16: 500 [IU]
  Filled 2013-11-16: qty 5

## 2013-11-16 MED ORDER — ACETAMINOPHEN 325 MG PO TABS
ORAL_TABLET | ORAL | Status: AC
  Filled 2013-11-16: qty 2

## 2013-11-16 NOTE — Progress Notes (Signed)
[facility]  Telephone:(336) [phone] Fax:(336) [phone]  OFFICE PROGRESS NOTE    CC: Tonye Pearson, MD [address] [place] [address] GYN:  Richarda Overlie, MD SU:  Almond Lint, MD RAD ONC:  Lurline Hare, MD   DIAGNOSIS: 52 year old woman with invasive ductal carcinoma of the right breast diagnosed in 11/2012.   PRIOR THERAPY: #1  Patient was originally seen in the multidisciplinary breast clinic on 12/17/2012 with a screening mammogram that showed an abnormality in the right breast. This abnormality measured 1.6 cm. She had a biopsy performed at the 11:30 position that showed invasive ductal carcinoma grade 2, estrogen receptor negative, progesterone receptor negative, HER-2 positive with a Ki-67 of 83%.  #2  Status post right lumpectomy on 12/31/2012 with final pathology revealing a stage I, pT1c pN0 pMX, 8 cm invasive ductal carcinoma, grade 3 with lymphovascular invasion, estrogen receptor negative, PR negative, HER-2/neu positive with amplification of 4.36, Ki-67 83% 2 sentinel nodes were negative for metastatic disease. Patient did not have positive margins and subsequently underwent right posterior margin, right anterior margin, and right anterior margin reexcision on 01/14/2013 that showed no residual tumor, and no atypia or malignancy.  Patient has a Port-A-Cath in place.  #3  The patient received with adjuvant chemotherapy initially consisting of Taxotere/Carboplatinum and Herceptin. Taxotere and Carboplatinum was given every 21 days and Herceptin on a weekly basis for a total of 4 cycles from 01/26/2013 - 03/30/2013.   Taxotere/Carboplatin was discontinued after 4 cycles due to worsening neuropathy.  Weekly Herceptin therapy was continued until 05/06/2013.  Currently receiving adjuvant Herceptin therapy every 3 weeks that started on 05/11/2013.    #4  The patient had radiation therapy from 05/13/2013 through 06/18/2013.   CURRENT THERAPY:  Herceptin every 3 weeks.   INTERVAL HISTORY: Julia Zuniga 52 y.o. female returns for followup today prior to herceptin therapy.   She continues to do well today.  She has a mammogram scheduled for next week.  She denies fevers, chills, palpitations, DOE, orthopnea, swelling, or any further concerns.  A 10 point ROS is negative.    MEDICAL HISTORY: Past Medical History  Diagnosis Date  . Hyperlipidemia   . Diverticulitis   . Hernia, hiatal   . Heartburn   . Anxiety   . Hot flashes   . PONV (postoperative nausea and vomiting)   . Breast cancer 12/08/12    Right  . History of radiation therapy 05/13/13-06/18/13    right breast, 5000 cGy 25 sessions     ALLERGIES:  is allergic to chlorhexidine; codeine; medralone; and oxycodone-acetaminophen.  MEDICATIONS:  Current Outpatient Prescriptions  Medication Sig Dispense Refill  . atorvastatin (LIPITOR) 40 MG tablet Take 1 tablet (40 mg total) by mouth every evening.  30 tablet  3  . carvedilol (COREG) 6.25 MG tablet Take 1 tablet (6.25 mg total) by mouth 2 (two) times daily with a meal.  60 tablet  3  . Cholecalciferol (D3-1000) 1000 UNITS capsule Take 1,000 Units by mouth daily.      Marland Kitchen HYDROcodone-acetaminophen (NORCO) 5-325 MG per tablet Take 1-2 tablets by mouth every 6 (six) hours as needed for pain.  30 tablet  1  . ibuprofen (ADVIL,MOTRIN) 200 MG tablet Take 400 mg by mouth every 6 (six) hours as needed. Pain      . Multiple Vitamins-Minerals (MULTIVITAMIN WITH MINERALS) tablet Take 1 tablet by mouth daily.      Marland Kitchen omeprazole (PRILOSEC) 40 MG capsule Take 1 capsule (40  mg total) by mouth daily.  90 capsule  2  . Probiotic Product (PROBIOTIC DAILY PO) Take 1 tablet by mouth daily.       No current facility-administered medications for this visit.    SURGICAL HISTORY:  Past Surgical History  Procedure Laterality Date  . Cesarean section      x 3  . Tubal ligation    . Tonsillectomy and adenoidectomy    . Cholecystectomy  2001     lap choli  . Breast lumpectomy with sentinel lymph node biopsy  12/31/2012    Procedure: BREAST LUMPECTOMY WITH SENTINEL LYMPH NODE BX;  Surgeon: Almond Lint, MD;  Location: [facility];  Service: General;  Laterality: Right;  . Portacath placement  12/31/2012    Procedure: INSERTION PORT-A-CATH;  Surgeon: Almond Lint, MD;  Location: [facility];  Service: General;  Laterality: Left;  Left Subclavian Vein  . Re-excision of breast cancer,superior margins  01/14/2013    Procedure: RE-EXCISION OF BREAST CANCER,SUPERIOR MARGINS;  Surgeon: Almond Lint, MD;  Location: [facility];  Service: General;  Laterality: Right;  right breast re excision for markers     REVIEW OF SYSTEMS:   A 10 point review of systems was completed and is negative except as noted in the interval history.  PHYSICAL EXAMINATION: Blood pressure 116/82, pulse 64, temperature 98.2 F (36.8 C), temperature source Oral, resp. rate 18, height 5\' 5"  (1.651 m), weight 233 lb 9.6 oz (105.96 kg), last menstrual period 01/07/2009. Body mass index is 38.87 kg/(m^2). General: Patient is a well appearing female in no acute distress HEENT: PERRLA, sclerae anicteric no conjunctival pallor, MMM Neck: supple, no palpable adenopathy Lungs: clear to auscultation bilaterally, no wheezes, rhonchi, or rales Cardiovascular: regular rate rhythm, S1, S2, no murmurs, rubs or gallops Abdomen: Soft, non-tender, non-distended, normoactive bowel sounds, no HSM Extremities: warm and well perfused, no clubbing, cyanosis, or edema, left shoulder no point tenderness Skin: No rashes or lesions Neuro: Non-focal Breasts: right lumpectomy without nodularity, scar tissue is present along with radiation changes. Bilateral Breasts without masses, nodules, skin changes, or sign of recurrence. ECOG PERFORMANCE STATUS: 1 - Symptomatic but completely ambulatory   LABORATORY DATA: Lab Results  Component Value Date   WBC 7.1 11/16/2013    HGB 11.3* 11/16/2013   HCT 35.4 11/16/2013   MCV 78.8* 11/16/2013   PLT 191 11/16/2013      Chemistry      Component Value Date/Time   NA 142 10/26/2013 0850   NA 135 02/24/2013 1857   K 4.0 10/26/2013 0850   K 3.4* 02/24/2013 1857   CL 106 06/01/2013 1045   CL 97 02/24/2013 1857   CO2 23 10/26/2013 0850   CO2 29 02/24/2013 1857   BUN 12.8 10/26/2013 0850   BUN 6 02/24/2013 1857   CREATININE 0.6 10/26/2013 0850   CREATININE 0.55 02/24/2013 1857      Component Value Date/Time   CALCIUM 9.9 10/26/2013 0850   CALCIUM 9.2 02/24/2013 1857   ALKPHOS 116 10/26/2013 0850   ALKPHOS 113 02/24/2013 1857   AST 22 10/26/2013 0850   AST 18 02/24/2013 1857   ALT 33 10/26/2013 0850   ALT 30 02/24/2013 1857   BILITOT 0.40 10/26/2013 0850   BILITOT 0.2* 02/24/2013 1857      RADIOGRAPHIC STUDIES: No results found.   ASSESSMENT: 52 year old female with: #1  Patient was originally seen in the multidisciplinary breast clinic on 12/17/2012 with a screening mammogram that showed an abnormality  in the right breast. This abnormality measured 1.6 cm. She had a biopsy performed at the 11:30 position that showed invasive ductal carcinoma grade 2, estrogen receptor negative, progesterone receptor negative, HER-2 positive with a Ki-67 of 83%.  #2  Status post right lumpectomy on 12/31/2012 with final pathology revealing a stage I, pT1c pN0 pMX, 8 cm invasive ductal carcinoma, grade 3 with lymphovascular invasion, estrogen receptor negative, PR negative, HER-2/neu positive with amplification of 4.36, Ki-67 83% 2 sentinel nodes were negative for metastatic disease. Patient did not have positive margins and subsequently underwent right posterior margin, right anterior margin, and right anterior margin reexcision on 01/14/2013 that showed no residual tumor, and no atypia or malignancy.  Patient has a Port-A-Cath in place.  #3  The patient received with adjuvant chemotherapy initially consisting of Taxotere/Carboplatinum  and Herceptin. Taxotere and Carboplatinum was given every 21 days and Herceptin on a weekly basis for a total of 4 cycles from 01/26/2013 - 03/30/2013.   Taxotere/Carboplatin was discontinued after 4 cycles due to worsening neuropathy.  Weekly Herceptin therapy was continued until 05/06/2013.  Currently receiving adjuvant Herceptin therapy every 3 weeks that started on 05/11/2013.    #4  The patient had radiation therapy from 05/13/2013 through 06/18/2013.  #5  Neuropathy--much improved, now off Gabapentin and Cymbalta  #6 Left upper extremity shoulder pain, she was offered physical therapy, but declined this.     PLAN:  #1 Julia Zuniga is doing well and will proceed with Herceptin therapy today.  She was evaluated and cleared to continue Herceptin therapy by Dr. Gala Romney on 09/09/13. I requested follow up with him for January, 2015.  I reviewed her CBC with her today in detail.    #2 She will return in three week's time for labs and Herceptin therapy, She will see [place] in January for labs, eval, and Herceptin.    All questions answered.  Julia Zuniga was encouraged to contact us in the interim with any questions, concerns, or problems.  I spent 25 minutes counseling the patient face to face.  The total time spent in the appointment was 30 minutes.  Illa Level, NP Medical Oncology [facility] [phone] 11/16/2013, 9:47 AM

## 2013-11-16 NOTE — Telephone Encounter (Signed)
appts made and printed. Pt is aware that tx will be added. i emailed  MW to add the tx. Lm w/ Shantelle for appt to to added for Bensimhon. Pt is aware that she will be called w/ that appt...td

## 2013-11-16 NOTE — Patient Instructions (Signed)
[facility] Discharge Instructions for Patients Receiving Chemotherapy  Today you received the following chemotherapy agents Herceptin.  To help prevent nausea and vomiting after your treatment, we encourage you to take your nausea medication as prescribed.   If you develop nausea and vomiting that is not controlled by your nausea medication, call the clinic.   BELOW ARE SYMPTOMS THAT SHOULD BE REPORTED IMMEDIATELY:  *FEVER GREATER THAN 100.5 F  *CHILLS WITH OR WITHOUT FEVER  NAUSEA AND VOMITING THAT IS NOT CONTROLLED WITH YOUR NAUSEA MEDICATION  *UNUSUAL SHORTNESS OF BREATH  *UNUSUAL BRUISING OR BLEEDING  TENDERNESS IN MOUTH AND THROAT WITH OR WITHOUT PRESENCE OF ULCERS  *URINARY PROBLEMS  *BOWEL PROBLEMS  UNUSUAL RASH Items with * indicate a potential emergency and should be followed up as soon as possible.  Feel free to call the clinic you have any questions or concerns. The clinic phone number is [phone].    

## 2013-11-16 NOTE — Telephone Encounter (Signed)
Per staff message and POF I have scheduled appts.  JMW  

## 2013-11-24 ENCOUNTER — Ambulatory Visit
Admission: RE | Admit: 2013-11-24 | Discharge: 2013-11-24 | Disposition: A | Discharge status: Home / Self Care | Payer: BC Managed Care – PPO | Source: Ambulatory Visit | Attending: Oncology | Admitting: Oncology

## 2013-11-24 ENCOUNTER — Other Ambulatory Visit (HOSPITAL_COMMUNITY): Payer: Self-pay | Admitting: Internal Medicine

## 2013-11-24 DIAGNOSIS — Z853 Personal history of malignant neoplasm of breast: Secondary | ICD-10-CM

## 2013-11-25 ENCOUNTER — Other Ambulatory Visit (HOSPITAL_COMMUNITY): Payer: Self-pay | Admitting: Cardiology

## 2013-11-25 DIAGNOSIS — I10 Essential (primary) hypertension: Secondary | ICD-10-CM

## 2013-11-25 MED ORDER — CARVEDILOL 6.25 MG PO TABS: 6.2500 mg | ORAL_TABLET | Freq: Two times a day (BID) | ORAL | Status: DC

## 2013-11-25 NOTE — Telephone Encounter (Signed)
Pt called to request refill

## 2013-12-04 ENCOUNTER — Other Ambulatory Visit: Payer: Self-pay | Admitting: Adult Health

## 2013-12-04 DIAGNOSIS — C50419 Malignant neoplasm of upper-outer quadrant of unspecified female breast: Secondary | ICD-10-CM

## 2013-12-07 ENCOUNTER — Other Ambulatory Visit (HOSPITAL_BASED_OUTPATIENT_CLINIC_OR_DEPARTMENT_OTHER): Payer: BC Managed Care – PPO

## 2013-12-07 ENCOUNTER — Other Ambulatory Visit: Payer: BC Managed Care – PPO | Admitting: Lab

## 2013-12-07 ENCOUNTER — Ambulatory Visit (HOSPITAL_BASED_OUTPATIENT_CLINIC_OR_DEPARTMENT_OTHER): Payer: BC Managed Care – PPO

## 2013-12-07 ENCOUNTER — Ambulatory Visit: Payer: BC Managed Care – PPO | Admitting: Adult Health

## 2013-12-07 VITALS — BP 132/90 | HR 71 | Temp 98.6°F

## 2013-12-07 DIAGNOSIS — C50419 Malignant neoplasm of upper-outer quadrant of unspecified female breast: Secondary | ICD-10-CM

## 2013-12-07 DIAGNOSIS — Z5112 Encounter for antineoplastic immunotherapy: Secondary | ICD-10-CM

## 2013-12-07 DIAGNOSIS — C50411 Malignant neoplasm of upper-outer quadrant of right female breast: Secondary | ICD-10-CM

## 2013-12-07 LAB — CBC WITH DIFFERENTIAL/PLATELET
Basophils Absolute: 0.1 10*3/uL (ref 0.0–0.1)
Eosinophils Absolute: 0.1 10*3/uL (ref 0.0–0.5)
HCT: 36.5 % (ref 34.8–46.6)
HGB: 11.7 g/dL (ref 11.6–15.9)
MCH: 25.1 pg (ref 25.1–34.0)
MCV: 78.3 fL — ABNORMAL LOW (ref 79.5–101.0)
MONO%: 5.5 % (ref 0.0–14.0)
NEUT#: 5.8 10*3/uL (ref 1.5–6.5)
NEUT%: 68.8 % (ref 38.4–76.8)
Platelets: 176 10*3/uL (ref 145–400)
RDW: 13.4 % (ref 11.2–14.5)
lymph#: 2 10*3/uL (ref 0.9–3.3)

## 2013-12-07 LAB — COMPREHENSIVE METABOLIC PANEL (CC13)
ALT: 29 U/L (ref 0–55)
AST: 16 U/L (ref 5–34)
Alkaline Phosphatase: 109 U/L (ref 40–150)
Anion Gap: 10 mEq/L (ref 3–11)
BUN: 11.4 mg/dL (ref 7.0–26.0)
CO2: 27 mEq/L (ref 22–29)
Calcium: 9.7 mg/dL (ref 8.4–10.4)
Chloride: 106 mEq/L (ref 98–109)
Creatinine: 0.6 mg/dL (ref 0.6–1.1)
Sodium: 143 mEq/L (ref 136–145)
Total Bilirubin: 0.38 mg/dL (ref 0.20–1.20)

## 2013-12-07 MED ORDER — TRASTUZUMAB CHEMO INJECTION 440 MG
6.0000 mg/kg | Freq: Once | INTRAVENOUS | Status: AC
  Administered 2013-12-07: 630 mg via INTRAVENOUS
  Filled 2013-12-07: qty 30

## 2013-12-07 MED ORDER — ACETAMINOPHEN 325 MG PO TABS
ORAL_TABLET | ORAL | Status: AC
  Filled 2013-12-07: qty 1

## 2013-12-07 MED ORDER — SODIUM CHLORIDE 0.9 % IV SOLN
Freq: Once | INTRAVENOUS | Status: AC
  Administered 2013-12-07: 11:00:00 via INTRAVENOUS

## 2013-12-07 MED ORDER — ACETAMINOPHEN 325 MG PO TABS
650.0000 mg | ORAL_TABLET | Freq: Once | ORAL | Status: AC
  Administered 2013-12-07: 650 mg via ORAL

## 2013-12-07 MED ORDER — SODIUM CHLORIDE 0.9 % IJ SOLN
10.0000 mL | INTRAMUSCULAR | Status: DC | PRN
  Administered 2013-12-07: 10 mL
  Filled 2013-12-07: qty 10

## 2013-12-07 MED ORDER — HEPARIN SOD (PORK) LOCK FLUSH 100 UNIT/ML IV SOLN
500.0000 [IU] | Freq: Once | INTRAVENOUS | Status: AC | PRN
  Administered 2013-12-07: 500 [IU]
  Filled 2013-12-07: qty 5

## 2013-12-07 NOTE — Patient Instructions (Signed)
[facility] Discharge Instructions for Patients Receiving Chemotherapy  Today you received the following chemotherapy agents Herceptin.    If you develop nausea and vomiting that is not controlled by your nausea medication, call the clinic.   BELOW ARE SYMPTOMS THAT SHOULD BE REPORTED IMMEDIATELY:  *FEVER GREATER THAN 100.5 F  *CHILLS WITH OR WITHOUT FEVER  NAUSEA AND VOMITING THAT IS NOT CONTROLLED WITH YOUR NAUSEA MEDICATION  *UNUSUAL SHORTNESS OF BREATH  *UNUSUAL BRUISING OR BLEEDING  TENDERNESS IN MOUTH AND THROAT WITH OR WITHOUT PRESENCE OF ULCERS  *URINARY PROBLEMS  *BOWEL PROBLEMS  UNUSUAL RASH Items with * indicate a potential emergency and should be followed up as soon as possible.  Feel free to call the clinic you have any questions or concerns. The clinic phone number is [phone].    

## 2013-12-14 ENCOUNTER — Telehealth: Payer: Self-pay

## 2013-12-14 ENCOUNTER — Encounter: Payer: Self-pay | Admitting: Gastroenterology

## 2013-12-14 NOTE — Telephone Encounter (Signed)
Patient is calling to see if we could pull her chart and see who we referred her to for her colon please call patient at [phone]

## 2013-12-15 NOTE — Telephone Encounter (Signed)
MR [id number]; patient was referred to Dr. Wynetta Emery at [facility]. LMOM with all this and the phone number: [phone]

## 2013-12-23 ENCOUNTER — Other Ambulatory Visit (HOSPITAL_COMMUNITY): Payer: Self-pay | Admitting: Internal Medicine

## 2013-12-24 ENCOUNTER — Ambulatory Visit (HOSPITAL_COMMUNITY)
Admission: RE | Admit: 2013-12-24 | Discharge: 2013-12-24 | Disposition: A | Discharge status: Home / Self Care | Payer: BC Managed Care – PPO | Source: Ambulatory Visit | Attending: Internal Medicine | Admitting: Internal Medicine

## 2013-12-24 ENCOUNTER — Other Ambulatory Visit (HOSPITAL_COMMUNITY): Payer: Self-pay

## 2013-12-24 ENCOUNTER — Ambulatory Visit (HOSPITAL_BASED_OUTPATIENT_CLINIC_OR_DEPARTMENT_OTHER)
Admission: RE | Admit: 2013-12-24 | Discharge: 2013-12-24 | Disposition: A | Discharge status: Home / Self Care | Payer: BC Managed Care – PPO | Source: Ambulatory Visit | Attending: Internal Medicine | Admitting: Internal Medicine

## 2013-12-24 VITALS — BP 118/78 | HR 70 | Resp 16 | Wt 231.5 lb

## 2013-12-24 DIAGNOSIS — C50919 Malignant neoplasm of unspecified site of unspecified female breast: Secondary | ICD-10-CM

## 2013-12-24 DIAGNOSIS — C50419 Malignant neoplasm of upper-outer quadrant of unspecified female breast: Secondary | ICD-10-CM

## 2013-12-24 DIAGNOSIS — Z79899 Other long term (current) drug therapy: Secondary | ICD-10-CM | POA: Insufficient documentation

## 2013-12-24 DIAGNOSIS — E785 Hyperlipidemia, unspecified: Secondary | ICD-10-CM

## 2013-12-24 DIAGNOSIS — I079 Rheumatic tricuspid valve disease, unspecified: Secondary | ICD-10-CM | POA: Insufficient documentation

## 2013-12-24 DIAGNOSIS — I369 Nonrheumatic tricuspid valve disorder, unspecified: Secondary | ICD-10-CM

## 2013-12-24 DIAGNOSIS — C50411 Malignant neoplasm of upper-outer quadrant of right female breast: Secondary | ICD-10-CM

## 2013-12-24 DIAGNOSIS — I10 Essential (primary) hypertension: Secondary | ICD-10-CM

## 2013-12-24 MED ORDER — ATORVASTATIN CALCIUM 40 MG PO TABS: ORAL_TABLET | ORAL | Status: DC

## 2013-12-24 NOTE — Progress Notes (Signed)
Patient ID: Julia Zuniga, female   DOB: October 18, 1961, 53 y.o.   MRN: [id number]  Referring Physician: Dr. Humphrey Rolls Primary Care: Dr. Tami Lin Primary Cardiologist: new  HPI: Julia Zuniga ia a 52 y.o. 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 HER-2/neu was amplified at 3.88.  S/P R lumpectomy on 12/31/12.  +family history of CAD.  She has completed 4/6 cycles of  taxotere, carboplatinum and herceptin. Also completed XRT.  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 She will continue herceptin every three weeks until February 2015.   Echos: 01/06/13: EF 55-60%, lat s' 9.2 03/26/13: EF 55-60% lat s' 9.8 06/18/13 EF 55--60 % lat s' 9.7 Global Strain -15.8 09/09/13 EF 60% lat s' 9.6% GS - 20% 12/24/12 EF 50%, lat s' 9.4 GS -17  Follow up: Doing well. Finishes Herceptin tx 01/2014. Denies any SOB, orthopnea, CP or edema. Arthritis pain in hands and shoulders has not changed, takes Ibuprofen which relieves the pain.     Review of Systems: All pertinent positives and negatives as in HPI, otherwise negative.   Past Medical History  Diagnosis Date  . Hyperlipidemia   . Diverticulitis   . Hernia, hiatal   . Heartburn   . Anxiety   . Hot flashes   . PONV (postoperative nausea and vomiting)   . Breast cancer 12/08/12    Right  . History of radiation therapy 05/13/13-06/18/13    right breast, 5000 cGy 25 sessions    Current Outpatient Prescriptions  Medication Sig Dispense Refill  . carvedilol (COREG) 6.25 MG tablet Take 1 tablet (6.25 mg total) by mouth 2 (two) times daily with a meal.  60 tablet  6  . Cholecalciferol (D3-1000) 1000 UNITS capsule Take 1,000 Units by mouth daily.      Marland Kitchen HYDROcodone-acetaminophen (NORCO) 5-325 MG per tablet Take 1-2 tablets by mouth every 6 (six) hours as needed for pain.  30 tablet  1  . ibuprofen  (ADVIL,MOTRIN) 200 MG tablet Take 400 mg by mouth every 6 (six) hours as needed. Pain      . Multiple Vitamins-Minerals (MULTIVITAMIN WITH MINERALS) tablet Take 1 tablet by mouth daily.      Marland Kitchen omeprazole (PRILOSEC) 40 MG capsule Take 1 capsule (40 mg total) by mouth daily.  90 capsule  2  . Probiotic Product (PROBIOTIC DAILY PO) Take 1 tablet by mouth daily.      Marland Kitchen atorvastatin (LIPITOR) 40 MG tablet TAKE ONE TABLET BY MOUTH ONCE DAILY IN THE EVENING  30 tablet  6   No current facility-administered medications for this encounter.    Allergies  Allergen Reactions  . Chlorhexidine     Patient skin gets bright red with rash.      . Codeine Swelling  . Medralone [Methylprednisolone Acetate]     "coming out of skin"   . Oxycodone-Acetaminophen Other (See Comments)    Face got red and hot    Filed Vitals:   12/24/13 1004  BP: 141/83  Pulse: 70  Resp: 16  Weight: 231 lb 8 oz (105.008 kg)  SpO2: 100%    PHYSICAL EXAM: General:  Well appearing. No respiratory difficulty HEENT: normal Neck: supple. no JVD. Carotids 2+ bilat; no bruits. No lymphadenopathy or thryomegaly appreciated. Cor: PMI nondisplaced. Regular rate & rhythm. No rubs, gallops or murmurs. Lungs: clear Abdomen: obese, soft, nontender, nondistended.  No hepatosplenomegaly. No bruits or masses. Good bowel sounds. Extremities: no cyanosis, clubbing, rash, edema Neuro: alert & oriented x 3, cranial nerves grossly intact. moves all 4 extremities w/o difficulty. Affect pleasant.   ASSESSMENT & PLAN:  1.  Breast Cancer On Herceptin.  - Doing well and completes therapy 01/2014. Denies any HF symptoms. Will have Dr. Haroldine Laws review ECHO and call patient with results. If ECHO parameters stable will continue with last Herceptin tx in Feb and then repeat ECHO in 2 months.  Junie Bame B, NP-C 10:07 AM  Addendum: Dr. Haroldine Laws reviewed ECHO and EF down from 60% to 50%. Lateral s' stable 9.4 and GS -17. Recommendations to  not complete last dose of Herceptin and to repeat ECHO in 2 months. She is already on coreg 6.25 mg BID which we will continue. Will call patient with results and discuss case with Dr. Humphrey Rolls.

## 2013-12-24 NOTE — Progress Notes (Signed)
Echocardiogram 2D Echocardiogram has been performed.  Julia Zuniga 12/24/2013, 10:03 AM

## 2013-12-25 ENCOUNTER — Ambulatory Visit (AMBULATORY_SURGERY_CENTER): Payer: BC Managed Care – PPO | Admitting: *Deleted

## 2013-12-25 ENCOUNTER — Telehealth (HOSPITAL_COMMUNITY): Payer: Self-pay | Admitting: Anesthesiology

## 2013-12-25 VITALS — Ht 66.25 in | Wt 232.0 lb

## 2013-12-25 DIAGNOSIS — Z1211 Encounter for screening for malignant neoplasm of colon: Secondary | ICD-10-CM

## 2013-12-25 MED ORDER — PEG-KCL-NACL-NASULF-NA ASC-C 100 G PO SOLR: ORAL | Status: DC

## 2013-12-25 NOTE — Telephone Encounter (Signed)
Called patient to review ECHO results. EF slightly down from 60 to 50%. Lateral s' stable 9.4 and CS -17. Talked to Dr. Chancy Milroy about holding treatment. Will see patient back in 2 months with ECHO. She understands results and knows to call if any HF symptoms. She is on coreg 6.25 mg BID already.  Junie Bame B NP-C 12:54 PM

## 2013-12-25 NOTE — Progress Notes (Signed)
No egg or soy allergy  Pt's appt date changed to 02-19-14; she will be finished her breast CA treatment on 01-18-14 so this will be a full month after treatment finished

## 2013-12-28 ENCOUNTER — Ambulatory Visit: Payer: BC Managed Care – PPO | Admitting: Adult Health

## 2013-12-28 ENCOUNTER — Ambulatory Visit: Payer: BC Managed Care – PPO

## 2013-12-28 ENCOUNTER — Other Ambulatory Visit: Payer: BC Managed Care – PPO

## 2013-12-28 ENCOUNTER — Encounter: Payer: Self-pay | Admitting: Gastroenterology

## 2013-12-29 ENCOUNTER — Encounter: Payer: Self-pay | Admitting: Oncology

## 2014-01-08 ENCOUNTER — Other Ambulatory Visit: Payer: BC Managed Care – PPO | Admitting: Gastroenterology

## 2014-01-18 ENCOUNTER — Ambulatory Visit (HOSPITAL_BASED_OUTPATIENT_CLINIC_OR_DEPARTMENT_OTHER): Payer: BC Managed Care – PPO | Admitting: Oncology

## 2014-01-18 ENCOUNTER — Ambulatory Visit: Payer: BC Managed Care – PPO

## 2014-01-18 ENCOUNTER — Telehealth: Payer: Self-pay | Admitting: Oncology

## 2014-01-18 ENCOUNTER — Other Ambulatory Visit (HOSPITAL_BASED_OUTPATIENT_CLINIC_OR_DEPARTMENT_OTHER): Payer: BC Managed Care – PPO

## 2014-01-18 ENCOUNTER — Encounter: Payer: Self-pay | Admitting: Oncology

## 2014-01-18 VITALS — BP 126/83 | HR 80 | Temp 98.2°F | Resp 20 | Ht 66.25 in | Wt 232.2 lb

## 2014-01-18 DIAGNOSIS — F411 Generalized anxiety disorder: Secondary | ICD-10-CM

## 2014-01-18 DIAGNOSIS — C50419 Malignant neoplasm of upper-outer quadrant of unspecified female breast: Secondary | ICD-10-CM

## 2014-01-18 DIAGNOSIS — M549 Dorsalgia, unspecified: Secondary | ICD-10-CM

## 2014-01-18 DIAGNOSIS — Z170 Estrogen receptor positive status [ER+]: Secondary | ICD-10-CM

## 2014-01-18 DIAGNOSIS — G5790 Unspecified mononeuropathy of unspecified lower limb: Secondary | ICD-10-CM

## 2014-01-18 LAB — CBC WITH DIFFERENTIAL/PLATELET
BASO%: 0.7 % (ref 0.0–2.0)
Basophils Absolute: 0.1 10*3/uL (ref 0.0–0.1)
EOS%: 2.4 % (ref 0.0–7.0)
Eosinophils Absolute: 0.2 10*3/uL (ref 0.0–0.5)
HCT: 35.9 % (ref 34.8–46.6)
HGB: 11.5 g/dL — ABNORMAL LOW (ref 11.6–15.9)
LYMPH%: 28.7 % (ref 14.0–49.7)
MCH: 25.1 pg (ref 25.1–34.0)
MCHC: 32 g/dL (ref 31.5–36.0)
MCV: 78.4 fL — ABNORMAL LOW (ref 79.5–101.0)
MONO#: 0.4 10*3/uL (ref 0.1–0.9)
MONO%: 5.5 % (ref 0.0–14.0)
NEUT#: 4.4 10*3/uL (ref 1.5–6.5)
NEUT%: 62.7 % (ref 38.4–76.8)
NRBC: 0 % (ref 0–0)
PLATELETS: 184 10*3/uL (ref 145–400)
RBC: 4.58 10*6/uL (ref 3.70–5.45)
RDW: 14 % (ref 11.2–14.5)
WBC: 7.1 10*3/uL (ref 3.9–10.3)
lymph#: 2 10*3/uL (ref 0.9–3.3)

## 2014-01-18 NOTE — Patient Instructions (Signed)
Breast Cancer Survivor Follow-Up Breast cancer begins when cells in the breast divide too rapidly. The extra cells form a lump (tumor). When the cancer is treated, the goal is to get rid of all cancer cells. However, sometimes a few cells survive. These cancer cells can then grow. They become recurrent cancer. This means the cancer comes back after treatment.  Most cases of recurrent breast cancer develop 3 to 5 years after treatment. However, sometimes it comes back just a few months after treatment. Other times, it does not come back until years later. If the cancer comes back in the same area as the first breast cancer, it is called a local recurrence. If the cancer comes back somewhere else in the body, it is called regional recurrence if the site is fairly near the breast or distant recurrence if it is far from the breast. Your caregiver may also use the term metastasize to indicate a cancer that has gone to another part of your body. Treatment is still possible after either kind of recurrence. The cancer can still be controlled.  CAUSES OF RECURRENT CANCER No one knows exactly why breast cancer starts in the first place. Why the cancer comes back after treatment is also not clear. It is known that certain conditions, called risk factors, can make this more likely. They include:  Developing breast cancer for the first time before age 60.  Having breast cancer that involves the lymph nodes. These are small, round pieces of tissue found all over the body. Their job is to help fight infections.  Having a large tumor. Cancer is more apt to come back if the first tumor was bigger than 2 inches (5 cm).  Having certain types of breast cancer, such as:  Inflammatory breast cancer. This rare type grows rapidly and causes the breast to become red and swollen.  A high-grade tumor. The grade of a tumor indicates how fast it will grow and spread. High-grade tumors grow more quickly than other types.  HER2  cancer. This refers to the tumor's genetic makeup. Tumors that have this type of gene are more likely to come back after treatment.  Having close tumor margins. This refers to the space between the tumor and normal, noncancerous cells. If the space is small, the tumor has a greater chance of coming back.  Having treatment involving a surgery to remove the tumor but not the entire breast (lumpectomy) and no radiation therapy. CARE AFTER BREAST CANCER Home Monitoring Women who have had breast cancer should continue to examine their breasts every month. The goal is to catch the cancer quickly if it comes back. Many women find it helpful to do so on the same day each month and to mark the calendar as a reminder. Let your caregiver know immediately if you have any signs of recurrent breast cancer. Symptoms will vary, depending on where the cancer recurs. The original type of treatment can also make a difference. Symptoms of local recurrence after a lumpectomy or a recurrence in the opposite breast may include:  A new lump or thickening in the breast.  A change in the way the skin looks on the breast (such as a rash, dimpling, or wrinkling).  Redness or swelling of the breast.  Changes in the nipple (such as being red, puckered, swollen, or leaking fluid). Symptoms of a recurrence after a breast removal surgery (mastectomy) may include:  A lump or thickening under the skin.  A thickening around the mastectomy scar. Symptoms   of regional recurrence in the lymph nodes near the breast may include:  A lump under the arm or above the collarbone.  Swelling of the arm.  Pain in the arm, shoulder, or chest.  Numbness in the hand or arm. Symptoms of distant recurrence may include:  A cough that does not go away.  Trouble breathing or shortness of breath.  Pain in the bones or the chest. This is pain that lasts or does not respond to rest and medicine.  Headaches.  Sudden vision  problems.  Dizziness.  Nausea or vomiting.  Losing weight without trying to.  Persistent abdominal pain.  Changes in bowel movements or blood in the stool.  Yellowing of the skin or eyes (jaundice).  Blood in the urine or bloody vaginal discharge. Clinical Monitoring  It is helpful to keep a schedule of appointments for needed tests and exams. This includes physical exams, breast exams, exams of the lymph nodes, and general exams.  For the first 3 years after being treated for breast cancer, see your caregiver every 3 to 6 months.  For years 4 and 5 after breast cancer, see your caregiver every 6 to 12 months.  After 5 years, see your caregiver at least once a year.  Regular breast X-rays (mammograms) should continue even if you had a mastectomy.  A mammogram should be done 1 year after the mammogram that first detected breast cancer.  A mammogram should be done every 6 to 12 months after that. Follow your caregiver's advice.  A pelvic exam done by your caregiver checks whether female organs are the normal size and shape. The exam is usually done every year. Ask your caregiver if that schedule is right for you.  Women taking tamoxifen should report any vaginal bleeding immediately to their caregiver. Tamoxifen is often given to women with a certain type of breast cancer. It has been shown to help prevent recurrence.  You will need to decide who your primary caregiver will be.  Most people continue to see their cancer specialist (oncologist) every 3 to 6 months for the first year after cancer treatment.  At some point, you may want to go back to seeing your family caregiver. You would no longer see your oncologist for regular checkups. Many women do this about 1 year after their first diagnosis of breast cancer.  You will still need to be seen every so often by your oncologist. Ask how often that should be. Coordinate this with your family or primary caregiver.  Think about  having genetic counseling. This would provide information on traits that can be passed or inherited from one generation to the next. In some cases, breast cancer runs in families. Tell your caregiver if you:  Are of Ashkenazi Jewish heritage.  Have any family member who has had ovarian cancer.  Have a mother, sister, or daughter who had breast cancer before age 19.  Have 2 or more close relatives who have had breast cancer. This means a mother, sister, daughter, aunt, or grandmother.  Had breast cancer in both breasts.  Have a female relative who has had breast cancer.  Some tests are not recommended for routine screening. Someone recovering from breast cancer does not need to have these tests if there are no problems. The tests have risks, such as radiation exposure, and can be costly. The risks of these tests are thought to be greater than the benefits:  Blood tests.  Chest X-rays.  Bone scans.  Liver ultrasound.  Computed tomography (CT scan).  Positron emission tomography (PET scan).  Magnetic resonance imaging (MRI scan). DIAGNOSIS OF RECURRENT CANCER Recurrent breast cancer may be suspected for various reasons. A mammogram may not look normal. You might feel a lump or have other symptoms. Your caregiver may find something unusual during an exam. To be sure, your caregiver will probably order some tests. The tests are needed because there are symptoms or hints of a problem. They could include:  Blood tests, including a test to check how well the liver is working. The liver is a common site for a distant cancer recurrence.  Imaging tests that create pictures of the inside of the body. These tests include:  Chest X-rays to show if the cancer has come back in the lungs.  CT scans to create detailed pictures of various areas of the body and help find a distant recurrence.  MRI scans to find anything unusual in the breast, chest, or lymph nodes.  Breast ultrasound tests to  examine the breasts.  Bone scans to create a picture of your whole skeleton and find cancer in bony areas.  PET scans to create an image of the whole body. PET scans can be used together with CT scans to show more detail.  Biopsy. A small sample of tissue is taken and checked under a microscope. If cancer cells are found, they may be tested to see if they contain the HER2 gene or the hormones estrogen and progesterone. This will help your caregiver decide how to treat the recurrent cancer. TREATMENT  How recurrent breast cancer is treated depends on where the new cancer is found. The type of treatment that was used for the first breast cancer makes a difference, too. A combination of treatments may be used. Options include:  Surgery.  If the cancer comes back in the breast that was not treated before, you may need a lumpectomy or mastectomy.  If the cancer comes back in the breast that was treated before, you may need a mastectomy.  The lymph nodes under the arm may need to be removed.  Radiation therapy.  For a local recurrence, radiation may be used if it was not used during the first treatment.  For a distance recurrence, radiation is sometimes used.  Chemotherapy.  This may be used before surgery to treat recurrent breast cancer.  This may be used to treat recurrent cancer that cannot be treated with surgery.  This may be used to treat a distant recurrence.  Hormone therapy.  Women with the HER2 gene may be given hormone therapy to attack this gene. Document Released: 07/25/2011 Document Revised: 02/18/2012 Document Reviewed: 07/25/2011 [employer] Patient Information 2014 [employer]  

## 2014-01-18 NOTE — Progress Notes (Signed)
[place]  [phone] Fax:(336) [phone]  OFFICE PROGRESS NOTE    CC: Leandrew Koyanagi, MD [place] [address] GYN:  Molli Posey, MD SU:  Stark Klein, MD RAD ONC:  Thea Silversmith, MD   DIAGNOSIS: 53 year old woman with invasive ductal carcinoma of the right breast diagnosed in 11/2012.   PRIOR THERAPY: #1  Patient was originally seen in the multidisciplinary breast clinic on 12/17/2012 with a screening mammogram that showed an abnormality in the right breast. This abnormality measured 1.6 cm. She had a biopsy performed at the 11:30 position that showed invasive ductal carcinoma grade 2, estrogen receptor negative, progesterone receptor negative, HER-2 positive with a Ki-67 of 83%.  #2  Status post right lumpectomy on 12/31/2012 with final pathology revealing a stage I, pT1c pN0 pMX, 8 cm invasive ductal carcinoma, grade 3 with lymphovascular invasion, estrogen receptor negative, PR negative, HER-2/neu positive with amplification of 4.36, Ki-67 83% 2 sentinel nodes were negative for metastatic disease. Patient did not have positive margins and subsequently underwent right posterior margin, right anterior margin, and right anterior margin reexcision on 01/14/2013 that showed no residual tumor, and no atypia or malignancy.  Patient has a Port-A-Cath in place.  #3  The patient received with adjuvant chemotherapy initially consisting of Taxotere/Carboplatinum and Herceptin. Taxotere and Carboplatinum was given every 21 days and Herceptin on a weekly basis for a total of 4 cycles from 01/26/2013 - 03/30/2013.   Taxotere/Carboplatin was discontinued after 4 cycles due to worsening neuropathy.  Weekly Herceptin therapy was continued until 05/06/2013.  Currently receiving adjuvant Herceptin therapy every 3 weeks that started on 05/11/2013.    #4  The patient had radiation therapy from 05/13/2013 through 06/18/2013.  #5 status post adjuvant  Herceptin administered every 3 weeks from 05/11/2013 through 11/16/13 for a total of 10 cycles. Cycle #11 and 12 were held secondary to fall and ejection fraction   CURRENT THERAPY: Observation   INTERVAL HISTORY: Julia Zuniga 53 y.o. female returns for followup today. Clinically she seems to be doing well. She was recently seen by cardiology. She was noted to have a drop in her ejection fraction. Therefore it was recommended to discontinue the Herceptin. I had discussed this with the patient back in December. She is very concerned regarding stopping the Herceptin 2 doses premature. But I have reassured her that we did get majority of the Herceptin in. I do think overall she should do well. Today she is complaining of some back pain. She also complains of burning sensation in her mid back as well as under the left breast. It is in 1 side only. I did discuss the possibility of her monitoring the. She is to report if she develops a rash of any sort. I am concerned about the possibility of shingles. However at this time she has no eruptions of any kind of rash. She is denying any nausea or vomiting. She is fatigued. She does have her neuropathic type of pain. She apparently discontinued the gabapentin. I have recommended that she start back on gabapentin at least 100 mg at bedtime. She has no bowel problems. She has not noticed any masses in her breasts. Her mammograms are up to date. I have recommended that she be followed by her primary care physician for ongoing health maintenance. Remainder of the 10 point review of systems is negative.   MEDICAL HISTORY: Past Medical History  Diagnosis Date  . Hyperlipidemia   . Diverticulitis  2012  . Heartburn   . Anxiety   . Hot flashes   . PONV (postoperative nausea and vomiting)   . Breast cancer 12/08/12    Right  . History of radiation therapy 05/13/13-06/18/13    right breast, 5000 cGy 25 sessions  . Anemia   . Hernia, hiatal     neuropathy after  chemo- hand and feet  . GERD (gastroesophageal reflux disease)   . Hypertension      ALLERGIES:  is allergic to chlorhexidine; codeine; medralone; and oxycodone-acetaminophen.  MEDICATIONS:  Current Outpatient Prescriptions  Medication Sig Dispense Refill  . atorvastatin (LIPITOR) 40 MG tablet TAKE ONE TABLET BY MOUTH ONCE DAILY IN THE EVENING  30 tablet  6  . B Complex Vitamins (VITAMIN B COMPLEX PO) Take 1 tablet by mouth daily.      . carvedilol (COREG) 6.25 MG tablet Take 1 tablet (6.25 mg total) by mouth 2 (two) times daily with a meal.  60 tablet  6  . Cholecalciferol (D3-1000) 1000 UNITS capsule Take 1,000 Units by mouth daily.      Marland Kitchen HYDROcodone-acetaminophen (NORCO) 5-325 MG per tablet Take 1-2 tablets by mouth every 6 (six) hours as needed for pain.  30 tablet  1  . ibuprofen (ADVIL,MOTRIN) 200 MG tablet Take 400 mg by mouth every 6 (six) hours as needed. Pain      . Multiple Vitamins-Minerals (MULTIVITAMIN WITH MINERALS) tablet Take 1 tablet by mouth daily.      Marland Kitchen omeprazole (PRILOSEC) 40 MG capsule Take 1 capsule (40 mg total) by mouth daily.  90 capsule  2  . peg 3350 powder (MOVIPREP) 100 G SOLR Moviprep as directed, no substitutions  1 kit  0  . Probiotic Product (PROBIOTIC DAILY PO) Take 1 tablet by mouth daily.       No current facility-administered medications for this visit.    SURGICAL HISTORY:  Past Surgical History  Procedure Laterality Date  . Cesarean section      x 3  . Tubal ligation    . Tonsillectomy and adenoidectomy    . Cholecystectomy  2001    lap choli  . Breast lumpectomy with sentinel lymph node biopsy  12/31/2012    Procedure: BREAST LUMPECTOMY WITH SENTINEL LYMPH NODE BX;  Surgeon: Stark Klein, MD;  Location: [place];  Service: General;  Laterality: Right;  . Portacath placement  12/31/2012    Procedure: INSERTION PORT-A-CATH;  Surgeon: Stark Klein, MD;  Location: [place];  Service: General;  Laterality:  Left;  Left Subclavian Vein  . Re-excision of breast cancer,superior margins  01/14/2013    Procedure: RE-EXCISION OF BREAST CANCER,SUPERIOR MARGINS;  Surgeon: Stark Klein, MD;  Location: [facility];  Service: General;  Laterality: Right;  right breast re excision for markers     REVIEW OF SYSTEMS:   A 10 point review of systems was completed and is negative except as noted in the interval history.  PHYSICAL EXAMINATION: Blood pressure 126/83, pulse 80, temperature 98.2 F (36.8 C), temperature source Oral, resp. rate 20, height 5' 6.25" (1.683 m), weight 232 lb 3.2 oz (105.325 kg), last menstrual period 01/07/2009. Body mass index is 37.18 kg/(m^2). General: Patient is a well appearing female in no acute distress HEENT: PERRLA, sclerae anicteric no conjunctival pallor, MMM Neck: supple, no palpable adenopathy Lungs: clear to auscultation bilaterally, no wheezes, rhonchi, or rales Cardiovascular: regular rate rhythm, S1, S2, no murmurs, rubs or gallops Abdomen: Soft, non-tender, non-distended, normoactive bowel sounds,  no HSM Extremities: warm and well perfused, no clubbing, cyanosis, or edema, left shoulder no point tenderness Skin: No rashes or lesions Neuro: Non-focal Breasts: right lumpectomy without nodularity, scar tissue is present along with radiation changes. Bilateral Breasts without masses, nodules, skin changes, or sign of recurrence. ECOG PERFORMANCE STATUS: 1 - Symptomatic but completely ambulatory   LABORATORY DATA: Lab Results  Component Value Date   WBC 7.1 01/18/2014   HGB 11.5* 01/18/2014   HCT 35.9 01/18/2014   MCV 78.4* 01/18/2014   PLT 184 01/18/2014      Chemistry      Component Value Date/Time   NA 143 12/07/2013 0955   NA 135 02/24/2013 1857   K 4.1 12/07/2013 0955   K 3.4* 02/24/2013 1857   CL 106 06/01/2013 1045   CL 97 02/24/2013 1857   CO2 27 12/07/2013 0955   CO2 29 02/24/2013 1857   BUN 11.4 12/07/2013 0955   BUN 6 02/24/2013 1857   CREATININE 0.6 12/07/2013  0955   CREATININE 0.55 02/24/2013 1857      Component Value Date/Time   CALCIUM 9.7 12/07/2013 0955   CALCIUM 9.2 02/24/2013 1857   ALKPHOS 109 12/07/2013 0955   ALKPHOS 113 02/24/2013 1857   AST 16 12/07/2013 0955   AST 18 02/24/2013 1857   ALT 29 12/07/2013 0955   ALT 30 02/24/2013 1857   BILITOT 0.38 12/07/2013 0955   BILITOT 0.2* 02/24/2013 1857      RADIOGRAPHIC STUDIES: No results found.   ASSESSMENT: 53 year old female with: #1  Patient was originally seen in the multidisciplinary breast clinic on 12/17/2012 with a screening mammogram that showed an abnormality in the right breast. This abnormality measured 1.6 cm. She had a biopsy performed at the 11:30 position that showed invasive ductal carcinoma grade 2, estrogen receptor negative, progesterone receptor negative, HER-2 positive with a Ki-67 of 83%.  #2  Status post right lumpectomy on 12/31/2012 with final pathology revealing a stage I, pT1c pN0 pMX, 8 cm invasive ductal carcinoma, grade 3 with lymphovascular invasion, estrogen receptor negative, PR negative, HER-2/neu positive with amplification of 4.36, Ki-67 83% 2 sentinel nodes were negative for metastatic disease. Patient did not have positive margins and subsequently underwent right posterior margin, right anterior margin, and right anterior margin reexcision on 01/14/2013 that showed no residual tumor, and no atypia or malignancy.   #3  The patient received with adjuvant chemotherapy initially consisting of Taxotere/Carboplatinum and Herceptin. Taxotere and Carboplatinum was given every 21 days and Herceptin on a weekly basis for a total of 4 cycles from 01/26/2013 - 03/30/2013.   Taxotere/Carboplatin was discontinued after 4 cycles due to worsening neuropathy.  Weekly Herceptin therapy was continued until 05/06/2013. The patient had radiation therapy from 05/13/2013 through 06/18/2013. She completed adjuvant Herceptin 10 cycles 11/16/2013.  #4  Neuropathy--much improved, now  off Gabapentin and Cymbalta, however do to her feet still tingling I have recommended she go on gabapentin 100 mg at bedtime.  #5 left upper extremity pain back pain:  patient was recommended physical therapy however initially she declined but apparently she is taking it again. We also discussed keeping you and I for possible rash indicating varicella-zoster infection.  #6 anxiety: We discussed extensively patient's anxiety. She does take anxiolytics. She in fact had to take 1 prior to coming here. She is encouraged to exercise Medicaid do yoga.  #7 cardiac function: Patient will followup with cardiology in 3 months time for a repeat echocardiogram.  All questions answered.  Ms. Kleier was encouraged to contact us in the interim with any questions, concerns, or problems.  I spent 25 minutes counseling the patient face to face.  The total time spent in the appointment was 30 minutes.  Julia Panning, MD Medical/Oncology [facility] [phone] (beeper) [phone] (Office)  01/18/2014, 9:19 AM

## 2014-01-18 NOTE — Telephone Encounter (Signed)
, °

## 2014-01-19 ENCOUNTER — Other Ambulatory Visit (INDEPENDENT_AMBULATORY_CARE_PROVIDER_SITE_OTHER): Payer: Self-pay | Admitting: General Surgery

## 2014-01-21 ENCOUNTER — Encounter: Payer: Self-pay | Admitting: Oncology

## 2014-01-21 ENCOUNTER — Other Ambulatory Visit: Payer: Self-pay | Admitting: Oncology

## 2014-01-21 MED ORDER — LORAZEPAM 2 MG/ML IJ SOLN
INTRAMUSCULAR | Status: AC
  Filled 2014-01-21: qty 1

## 2014-01-21 MED ORDER — ONDANSETRON 16 MG/50ML IVPB (CHCC)
INTRAVENOUS | Status: AC
  Filled 2014-01-21: qty 16

## 2014-01-21 MED ORDER — DEXAMETHASONE SODIUM PHOSPHATE 20 MG/5ML IJ SOLN
INTRAMUSCULAR | Status: AC
  Filled 2014-01-21: qty 5

## 2014-01-22 ENCOUNTER — Telehealth (INDEPENDENT_AMBULATORY_CARE_PROVIDER_SITE_OTHER): Payer: Self-pay

## 2014-01-22 NOTE — Telephone Encounter (Signed)
Patient calling into office to see if we have scheduled her PAC Removal yet.  Read note from Dr. Humphrey Rolls that patient may proceed with PAC Removal.  Orders written and sent to [place] for scheduling.  Spoke to patient to address [facility] Checklist, attached to OR posting slip.

## 2014-01-27 ENCOUNTER — Encounter: Payer: Self-pay | Admitting: Oncology

## 2014-02-03 ENCOUNTER — Encounter: Payer: BC Managed Care – PPO | Admitting: Gastroenterology

## 2014-02-18 ENCOUNTER — Encounter (HOSPITAL_BASED_OUTPATIENT_CLINIC_OR_DEPARTMENT_OTHER): Payer: Self-pay | Admitting: *Deleted

## 2014-02-18 NOTE — Progress Notes (Signed)
Getting pac out-had ekg 1 yr ago-labs 01/18/14-will need istat if [employer]

## 2014-02-19 ENCOUNTER — Encounter: Payer: BC Managed Care – PPO | Admitting: Gastroenterology

## 2014-02-24 ENCOUNTER — Encounter (HOSPITAL_BASED_OUTPATIENT_CLINIC_OR_DEPARTMENT_OTHER): Payer: BC Managed Care – PPO | Admitting: Certified Registered"

## 2014-02-24 ENCOUNTER — Ambulatory Visit (HOSPITAL_BASED_OUTPATIENT_CLINIC_OR_DEPARTMENT_OTHER): Payer: BC Managed Care – PPO | Admitting: Certified Registered"

## 2014-02-24 ENCOUNTER — Encounter (HOSPITAL_BASED_OUTPATIENT_CLINIC_OR_DEPARTMENT_OTHER): Payer: Self-pay | Admitting: Certified Registered"

## 2014-02-24 ENCOUNTER — Encounter (HOSPITAL_BASED_OUTPATIENT_CLINIC_OR_DEPARTMENT_OTHER)
Admission: RE | Disposition: A | Discharge status: Home / Self Care | Payer: Self-pay | Source: Ambulatory Visit | Attending: General Surgery

## 2014-02-24 ENCOUNTER — Ambulatory Visit (HOSPITAL_BASED_OUTPATIENT_CLINIC_OR_DEPARTMENT_OTHER)
Admission: RE | Admit: 2014-02-24 | Discharge: 2014-02-24 | Disposition: A | Discharge status: Home / Self Care | Payer: BC Managed Care – PPO | Source: Ambulatory Visit | Attending: General Surgery | Admitting: General Surgery

## 2014-02-24 DIAGNOSIS — E785 Hyperlipidemia, unspecified: Secondary | ICD-10-CM | POA: Insufficient documentation

## 2014-02-24 DIAGNOSIS — K219 Gastro-esophageal reflux disease without esophagitis: Secondary | ICD-10-CM | POA: Insufficient documentation

## 2014-02-24 DIAGNOSIS — F411 Generalized anxiety disorder: Secondary | ICD-10-CM | POA: Insufficient documentation

## 2014-02-24 DIAGNOSIS — Z452 Encounter for adjustment and management of vascular access device: Secondary | ICD-10-CM

## 2014-02-24 DIAGNOSIS — C50919 Malignant neoplasm of unspecified site of unspecified female breast: Secondary | ICD-10-CM | POA: Insufficient documentation

## 2014-02-24 DIAGNOSIS — I10 Essential (primary) hypertension: Secondary | ICD-10-CM | POA: Insufficient documentation

## 2014-02-24 HISTORY — PX: PORT-A-CATH REMOVAL: SHX5289

## 2014-02-24 LAB — POCT I-STAT, CHEM 8
BUN: 11 mg/dL (ref 6–23)
CALCIUM ION: 1.13 mmol/L (ref 1.12–1.23)
CREATININE: 0.6 mg/dL (ref 0.50–1.10)
Chloride: 106 mEq/L (ref 96–112)
Glucose, Bld: 96 mg/dL (ref 70–99)
HCT: 39 % (ref 36.0–46.0)
HEMOGLOBIN: 13.3 g/dL (ref 12.0–15.0)
Potassium: 4.2 mEq/L (ref 3.7–5.3)
Sodium: 143 mEq/L (ref 137–147)
TCO2: 21 mmol/L (ref 0–100)

## 2014-02-24 SURGERY — REMOVAL PORT-A-CATH
Anesthesia: Monitor Anesthesia Care | Laterality: Left

## 2014-02-24 MED ORDER — MIDAZOLAM HCL 2 MG/2ML IJ SOLN: 1.0000 mg | INTRAMUSCULAR | Status: DC | PRN

## 2014-02-24 MED ORDER — CEFAZOLIN SODIUM-DEXTROSE 2-3 GM-% IV SOLR
2.0000 g | INTRAVENOUS | Status: AC
  Administered 2014-02-24: 2 g via INTRAVENOUS

## 2014-02-24 MED ORDER — FENTANYL CITRATE 0.05 MG/ML IJ SOLN: 50.0000 ug | INTRAMUSCULAR | Status: DC | PRN

## 2014-02-24 MED ORDER — BUPIVACAINE HCL (PF) 0.25 % IJ SOLN
INTRAMUSCULAR | Status: AC
  Filled 2014-02-24: qty 30

## 2014-02-24 MED ORDER — HYDROCODONE-ACETAMINOPHEN 5-325 MG PO TABS: 1.0000 | ORAL_TABLET | ORAL | Status: DC | PRN

## 2014-02-24 MED ORDER — CEFAZOLIN SODIUM-DEXTROSE 2-3 GM-% IV SOLR
INTRAVENOUS | Status: AC
  Filled 2014-02-24: qty 50

## 2014-02-24 MED ORDER — MIDAZOLAM HCL 5 MG/5ML IJ SOLN
INTRAMUSCULAR | Status: DC | PRN
  Administered 2014-02-24: 2 mg via INTRAVENOUS

## 2014-02-24 MED ORDER — PROPOFOL 10 MG/ML IV EMUL
INTRAVENOUS | Status: AC
  Filled 2014-02-24: qty 50

## 2014-02-24 MED ORDER — FENTANYL CITRATE 0.05 MG/ML IJ SOLN
INTRAMUSCULAR | Status: AC
  Filled 2014-02-24: qty 4

## 2014-02-24 MED ORDER — PROPOFOL INFUSION 10 MG/ML OPTIME
INTRAVENOUS | Status: DC | PRN
  Administered 2014-02-24: 100 ug/kg/min via INTRAVENOUS

## 2014-02-24 MED ORDER — FENTANYL CITRATE 0.05 MG/ML IJ SOLN
INTRAMUSCULAR | Status: DC | PRN
  Administered 2014-02-24: 50 ug via INTRAVENOUS
  Administered 2014-02-24: 100 ug via INTRAVENOUS

## 2014-02-24 MED ORDER — LIDOCAINE-EPINEPHRINE (PF) 1 %-1:200000 IJ SOLN
INTRAMUSCULAR | Status: DC | PRN
  Administered 2014-02-24: 09:00:00 via INTRAMUSCULAR

## 2014-02-24 MED ORDER — BUPIVACAINE-EPINEPHRINE PF 0.5-1:200000 % IJ SOLN
INTRAMUSCULAR | Status: AC
  Filled 2014-02-24: qty 30

## 2014-02-24 MED ORDER — LACTATED RINGERS IV SOLN
INTRAVENOUS | Status: DC
  Administered 2014-02-24: 08:00:00 via INTRAVENOUS

## 2014-02-24 MED ORDER — MIDAZOLAM HCL 2 MG/2ML IJ SOLN
INTRAMUSCULAR | Status: AC
  Filled 2014-02-24: qty 2

## 2014-02-24 MED ORDER — ONDANSETRON HCL 4 MG/2ML IJ SOLN
INTRAMUSCULAR | Status: DC | PRN
  Administered 2014-02-24: 4 mg via INTRAVENOUS

## 2014-02-24 MED ORDER — LIDOCAINE HCL (CARDIAC) 20 MG/ML IV SOLN
INTRAVENOUS | Status: DC | PRN
  Administered 2014-02-24: 40 mg via INTRAVENOUS

## 2014-02-24 SURGICAL SUPPLY — 38 items
ADH SKN CLS APL DERMABOND .7 (GAUZE/BANDAGES/DRESSINGS) ×1
BLADE HEX COATED 2.75 (ELECTRODE) ×2 IMPLANT
BLADE SURG 15 STRL LF DISP TIS (BLADE) ×1 IMPLANT
BLADE SURG 15 STRL SS (BLADE) ×2
CANISTER SUCT 1200ML W/VALVE (MISCELLANEOUS) IMPLANT
CHLORAPREP W/TINT 26ML (MISCELLANEOUS) ×2 IMPLANT
COVER MAYO STAND STRL (DRAPES) ×2 IMPLANT
COVER TABLE BACK 60X90 (DRAPES) ×2 IMPLANT
DECANTER SPIKE VIAL GLASS SM (MISCELLANEOUS) IMPLANT
DERMABOND ADVANCED (GAUZE/BANDAGES/DRESSINGS) ×1
DERMABOND ADVANCED .7 DNX12 (GAUZE/BANDAGES/DRESSINGS) ×1 IMPLANT
DRAPE PED LAPAROTOMY (DRAPES) ×2 IMPLANT
DRAPE UTILITY XL STRL (DRAPES) ×2 IMPLANT
ELECT REM PT RETURN 9FT ADLT (ELECTROSURGICAL) ×2
ELECTRODE REM PT RTRN 9FT ADLT (ELECTROSURGICAL) ×1 IMPLANT
GLOVE BIO SURGEON STRL SZ 6 (GLOVE) ×2 IMPLANT
GLOVE BIO SURGEON STRL SZ 6.5 (GLOVE) ×1 IMPLANT
GLOVE BIOGEL PI IND STRL 6.5 (GLOVE) ×1 IMPLANT
GLOVE BIOGEL PI IND STRL 7.0 (GLOVE) IMPLANT
GLOVE BIOGEL PI INDICATOR 6.5 (GLOVE) ×1
GLOVE BIOGEL PI INDICATOR 7.0 (GLOVE) ×1
GLOVE EXAM NITRILE EXT CUFF MD (GLOVE) ×1 IMPLANT
GOWN STRL REUS W/ TWL LRG LVL3 (GOWN DISPOSABLE) ×1 IMPLANT
GOWN STRL REUS W/TWL 2XL LVL3 (GOWN DISPOSABLE) ×2 IMPLANT
GOWN STRL REUS W/TWL LRG LVL3 (GOWN DISPOSABLE) ×2
NDL HYPO 25X1 1.5 SAFETY (NEEDLE) ×1 IMPLANT
NEEDLE HYPO 25X1 1.5 SAFETY (NEEDLE) ×2 IMPLANT
NS IRRIG 1000ML POUR BTL (IV SOLUTION) IMPLANT
PACK BASIN DAY SURGERY FS (CUSTOM PROCEDURE TRAY) ×2 IMPLANT
PENCIL BUTTON HOLSTER BLD 10FT (ELECTRODE) ×2 IMPLANT
SUT MNCRL AB 4-0 PS2 18 (SUTURE) ×2 IMPLANT
SUT VIC AB 3-0 SH 27 (SUTURE) ×2
SUT VIC AB 3-0 SH 27X BRD (SUTURE) ×1 IMPLANT
SYR CONTROL 10ML LL (SYRINGE) ×2 IMPLANT
TOWEL OR 17X24 6PK STRL BLUE (TOWEL DISPOSABLE) ×1 IMPLANT
TOWEL OR NON WOVEN STRL DISP B (DISPOSABLE) ×2 IMPLANT
TUBE CONNECTING 20X1/4 (TUBING) IMPLANT
YANKAUER SUCT BULB TIP NO VENT (SUCTIONS) IMPLANT

## 2014-02-24 NOTE — Anesthesia Procedure Notes (Signed)
Procedure Name: MAC Date/Time: 02/24/2014 8:53 AM Performed by: Baxter Flattery Pre-anesthesia Checklist: Patient identified, Emergency Drugs available, Suction available and Patient being monitored Patient Re-evaluated:Patient Re-evaluated prior to inductionOxygen Delivery Method: Simple face mask Preoxygenation: Pre-oxygenation with 100% oxygen Dental Injury: Teeth and Oropharynx as per pre-operative assessment

## 2014-02-24 NOTE — Discharge Instructions (Addendum)
Post Anesthesia Home Care Instructions  Activity: Get plenty of rest for the remainder of the day. A responsible adult should stay with you for 24 hours following the procedure.  For the next 24 hours, DO NOT: -Drive a car -Paediatric nurse -Drink alcoholic beverages -Take any medication unless instructed by your physician -Make any legal decisions or sign important papers.  Meals: Start with liquid foods such as gelatin or soup. Progress to regular foods as tolerated. Avoid greasy, spicy, heavy foods. If nausea and/or vomiting occur, drink only clear liquids until the nausea and/or vomiting subsides. Call your physician if vomiting continues.  Special Instructions/Symptoms: Your throat may feel dry or sore from the anesthesia or the breathing tube placed in your throat during surgery. If this causes discomfort, gargle with warm salt water. The discomfort should disappear within 24 hours.   [place] Office Phone Number [phone]   POST OP INSTRUCTIONS  Always review your discharge instruction sheet given to you by the facility where your surgery was performed.  IF YOU HAVE DISABILITY OR FAMILY LEAVE FORMS, YOU MUST BRING THEM TO THE OFFICE FOR PROCESSING.  DO NOT GIVE THEM TO YOUR DOCTOR.  1. A prescription for pain medication may be given to you upon discharge.  Take your pain medication as prescribed, if needed.  If narcotic pain medicine is not needed, then you may take acetaminophen (Tylenol) or ibuprofen (Advil) as needed. 2. Take your usually prescribed medications unless otherwise directed 3. If you need a refill on your pain medication, please contact your pharmacy.  They will contact our office to request authorization.  Prescriptions will not be filled after 5pm or on week-ends. 4. You should eat very light the first 24 hours after surgery, such as soup, crackers, pudding, etc.  Resume your normal diet the day after surgery 5. It is common to  experience some constipation if taking pain medication after surgery.  Increasing fluid intake and taking a stool softener will usually help or prevent this problem from occurring.  A mild laxative (Milk of Magnesia or Miralax) should be taken according to package directions if there are no bowel movements after 48 hours. 6. You may shower in 48 hours.  The surgical glue will flake off in 2-3 weeks.   7. ACTIVITIES:  No strenuous activity or heavy lifting for 1 week.   a. You may drive when you no longer are taking prescription pain medication, you can comfortably wear a seatbelt, and you can safely maneuver your car and apply brakes. b. RETURN TO WORK:  __________1 week_______________ Dennis Bast should see your doctor in the office for a follow-up appointment approximately three-four weeks after your surgery.    WHEN TO CALL YOUR DOCTOR: 1. Fever over 101.0 2. Nausea and/or vomiting. 3. Extreme swelling or bruising. 4. Continued bleeding from incision. 5. Increased pain, redness, or drainage from the incision.  The clinic staff is available to answer your questions during regular business hours.  Please dont hesitate to call and ask to speak to one of the nurses for clinical concerns.  If you have a medical emergency, go to the nearest emergency room or call 911.  A surgeon from [facility] is always on call at the hospital.  For further questions, please visit [email or web address]     Post Anesthesia Home Care Instructions  Activity: Get plenty of rest for the remainder of the day. A responsible adult should stay with you for 24 hours following the procedure.  For the  next 24 hours, DO NOT: -Drive a car -Paediatric nurse -Drink alcoholic beverages -Take any medication unless instructed by your physician -Make any legal decisions or sign important papers.  Meals: Start with liquid foods such as gelatin or soup. Progress to regular foods as tolerated. Avoid greasy,  spicy, heavy foods. If nausea and/or vomiting occur, drink only clear liquids until the nausea and/or vomiting subsides. Call your physician if vomiting continues.  Special Instructions/Symptoms: Your throat may feel dry or sore from the anesthesia or the breathing tube placed in your throat during surgery. If this causes discomfort, gargle with warm salt water. The discomfort should disappear within 24 hours.

## 2014-02-24 NOTE — Op Note (Signed)
  PRE-OPERATIVE DIAGNOSIS:  un-needed Port-A-Cath for right breast cacner  POST-OPERATIVE DIAGNOSIS:  Same   PROCEDURE:  Procedure(s):  REMOVAL PORT-A-CATH  SURGEON:  Surgeon(s):  Stark Klein, MD  ANESTHESIA:   MAC + local  EBL:   Minimal  SPECIMEN:  None  Complications : none known  Procedure:   Pt was  identified in the holding area and taken to the operating room where she was placed supine on the operating room table.  MAC was induced.  The L upper chest was prepped and draped.  The prior incision was anesthetized with local anesthetic.  The incision was opened with a #15 blade.  The subcutaneous tissue was divided with the cautery and the metzenbaum scissors.  The port was identified and the capsule opened.  The 4 2-0 prolene sutures were removed.  The port was then removed and pressure held on the tract.  The catheter appeared intact without evidence of breakage.  The wound was inspected for hemostasis, which was achieved with cautery.  The wound was closed with 3-0 vicryl deep dermal interrupted sutures and 4-0 Monocryl running subcuticular suture.  The wound was cleaned, dried, and dressed with dermabond.  The patient was awakened from anesthesia and taken to the PACU in stable condition.  Needle, sponge, and instrument counts are correct.

## 2014-02-24 NOTE — H&P (Signed)
Julia Zuniga is an 53 y.o. female.   Chief Complaint: *right breast cancer HPI:  Pt has h/o right breast cancer.  She has completed adjuvant therapy and requests port removal.    Past Medical History  Diagnosis Date  . Hyperlipidemia   . Diverticulitis     2012  . Heartburn   . Anxiety   . Hot flashes   . PONV (postoperative nausea and vomiting)   . Breast cancer 12/08/12    Right  . History of radiation therapy 05/13/13-06/18/13    right breast, 5000 cGy 25 sessions  . Anemia   . 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 GERD (gastroesophageal reflux disease)   . Hypertension     Past Surgical History  Procedure Laterality Date  . Cesarean section      x 3  . Tubal ligation    . Tonsillectomy and adenoidectomy    . Cholecystectomy  2001    lap choli  . Breast lumpectomy with sentinel lymph node biopsy  12/31/2012    Procedure: BREAST LUMPECTOMY WITH SENTINEL LYMPH NODE BX;  Surgeon: Stark Klein, MD;  Location: [place];  Service: General;  Laterality: Right;  . Portacath placement  12/31/2012    Procedure: INSERTION PORT-A-CATH;  Surgeon: Stark Klein, MD;  Location: [place];  Service: General;  Laterality: Left;  Left Subclavian Vein  . Re-excision of breast cancer,superior margins  01/14/2013    Procedure: RE-EXCISION OF BREAST CANCER,SUPERIOR MARGINS;  Surgeon: Stark Klein, MD;  Location: [facility];  Service: General;  Laterality: Right;  right breast re excision for markers     Family History  Problem Relation Age of Onset  . Stomach cancer Father   . Lung cancer Maternal Uncle   . Skin cancer Maternal Grandmother   . Lung cancer Maternal Grandfather   . Lymphoma Cousin   . Colon cancer Neg Hx   . Esophageal cancer Neg Hx   . Rectal cancer Neg Hx    Social History:  reports that she has never smoked. She has never used smokeless tobacco. She reports that she drinks about 1.2 ounces of alcohol per week. She  reports that she does not use illicit drugs.  Allergies:  Allergies  Allergen Reactions  . Chlorhexidine     Patient skin gets bright red with rash.      . Codeine Swelling  . Medralone [Methylprednisolone Acetate]     "coming out of skin"   . Oxycodone-Acetaminophen Other (See Comments)    Face got red and hot    Medications Prior to Admission  Medication Sig Dispense Refill  . atorvastatin (LIPITOR) 40 MG tablet TAKE ONE TABLET BY MOUTH ONCE DAILY IN THE EVENING  30 tablet  6  . B Complex Vitamins (VITAMIN B COMPLEX PO) Take 1 tablet by mouth daily.      . carvedilol (COREG) 6.25 MG tablet Take 1 tablet (6.25 mg total) by mouth 2 (two) times daily with a meal.  60 tablet  6  . Cholecalciferol (D3-1000) 1000 UNITS capsule Take 1,000 Units by mouth daily.      Marland Kitchen ibuprofen (ADVIL,MOTRIN) 200 MG tablet Take 400 mg by mouth every 6 (six) hours as needed. Pain      . LORazepam (ATIVAN) 0.5 MG tablet Take 0.5 mg by mouth as needed for anxiety.      . Multiple Vitamins-Minerals (MULTIVITAMIN WITH MINERALS) tablet Take 1 tablet by mouth daily.      Marland Kitchen  omeprazole (PRILOSEC) 40 MG capsule Take 1 capsule (40 mg total) by mouth daily.  90 capsule  2  . Probiotic Product (PROBIOTIC DAILY PO) Take 1 tablet by mouth daily.        Results for orders placed during the hospital encounter of 02/24/14 (from the past 48 hour(s))  POCT I-STAT, CHEM 8     Status: None   Collection Time    02/24/14  8:02 AM      Result Value Ref Range   Sodium 143  137 - 147 mEq/L   Potassium 4.2  3.7 - 5.3 mEq/L   Chloride 106  96 - 112 mEq/L   BUN 11  6 - 23 mg/dL   Creatinine, Ser 0.60  0.50 - 1.10 mg/dL   Glucose, Bld 96  70 - 99 mg/dL   Calcium, Ion 1.13  1.12 - 1.23 mmol/L   TCO2 21  0 - 100 mmol/L   Hemoglobin 13.3  12.0 - 15.0 g/dL   HCT 39.0  36.0 - 46.0 %   No results found.  Review of Systems  All other systems reviewed and are negative.    Blood pressure 115/80, pulse 79, temperature 97.9 F  (36.6 C), temperature source Oral, resp. rate 20, height 5\' 6"  (1.676 m), weight 236 lb (107.049 kg), last menstrual period 01/07/2009, SpO2 95.00%. Physical Exam  Constitutional: She is oriented to person, place, and time. She appears well-developed and well-nourished. No distress.  HENT:  Head: Normocephalic and atraumatic.  Eyes: Pupils are equal, round, and reactive to light.  Neck: Normal range of motion. Neck supple. No thyromegaly present.  Cardiovascular: Normal rate.   Respiratory: Effort normal. No respiratory distress.  GI: Soft. She exhibits no distension.  Musculoskeletal: Normal range of motion.  Neurological: She is alert and oriented to person, place, and time.  Skin: Skin is warm and dry. She is not diaphoretic.  Psychiatric: She has a normal mood and affect. Her behavior is normal. Judgment and thought content normal.     Assessment/Plan Right breast cancer Remove port a cath.  Reviewed risks of bleeding, infection, pain, wound complications, etc.  Pt wished to proceed.    Phyllis Whitefield 02/24/2014, 8:39 AM

## 2014-02-24 NOTE — Transfer of Care (Signed)
Immediate Anesthesia Transfer of Care Note  Patient: Julia Zuniga  Procedure(s) Performed: Procedure(s): REMOVAL PORT-A-CATH (Left)  Patient Location: PACU  Anesthesia Type:MAC  Level of Consciousness: awake, alert  and oriented  Airway & Oxygen Therapy: Patient Spontanous Breathing, RA SaO2 100%  Post-op Assessment: Report given to PACU RN, Post -op Vital signs reviewed and stable and Patient moving all extremities  Post vital signs: Reviewed and stable  Complications: No apparent anesthesia complications

## 2014-02-24 NOTE — Anesthesia Postprocedure Evaluation (Signed)
Anesthesia Post Note  Patient: Julia Zuniga  Procedure(s) Performed: Procedure(s) (LRB): REMOVAL PORT-A-CATH (Left)  Anesthesia type: general  Patient location: PACU  Post pain: Pain level controlled  Post assessment: Patient's Cardiovascular Status Stable  Last Vitals:  Filed Vitals:   02/24/14 1001  BP: 122/83  Pulse: 68  Temp: 36.4 C  Resp: 16    Post vital signs: Reviewed and stable  Level of consciousness: sedated  Complications: No apparent anesthesia complications

## 2014-02-24 NOTE — Anesthesia Preprocedure Evaluation (Addendum)
Anesthesia Evaluation  Patient identified by MRN, date of birth, ID band Patient awake    Reviewed: Allergy & Precautions, H&P , NPO status , Patient's Chart, lab work & pertinent test results  History of Anesthesia Complications (+) PONV  Airway Mallampati: I TM Distance: >3 FB Neck ROM: Full    Dental   Pulmonary          Cardiovascular hypertension, Pt. on medications     Neuro/Psych Anxiety    GI/Hepatic GERD-  Controlled,  Endo/Other    Renal/GU      Musculoskeletal   Abdominal   Peds  Hematology   Anesthesia Other Findings   Reproductive/Obstetrics                          Anesthesia Physical Anesthesia Plan  ASA: II  Anesthesia Plan: MAC   Post-op Pain Management:    Induction: Intravenous  Airway Management Planned:   Additional Equipment:   Intra-op Plan:   Post-operative Plan:   Informed Consent: I have reviewed the patients History and Physical, chart, labs and discussed the procedure including the risks, benefits and alternatives for the proposed anesthesia with the patient or authorized representative who has indicated his/her understanding and acceptance.     Plan Discussed with: CRNA and Surgeon  Anesthesia Plan Comments:        Anesthesia Quick Evaluation

## 2014-02-25 ENCOUNTER — Telehealth (INDEPENDENT_AMBULATORY_CARE_PROVIDER_SITE_OTHER): Payer: Self-pay

## 2014-02-25 ENCOUNTER — Encounter (HOSPITAL_BASED_OUTPATIENT_CLINIC_OR_DEPARTMENT_OTHER): Payer: Self-pay | Admitting: General Surgery

## 2014-02-25 NOTE — Telephone Encounter (Signed)
Pt called in stating she is having swelling at port removal site. No redness, fever or drainage. She has been taking ibuprofen. Advised pt to switch to tylenol for pain and use ice pack on area to try and reduce swelling. Advised this could be a hematoma at which her body would absorb. Made a follow up appt for next week. If her swelling gets worse or does not improve or any other symptoms rise, pt will give [place] a call back.

## 2014-02-26 ENCOUNTER — Other Ambulatory Visit (HOSPITAL_COMMUNITY): Payer: Self-pay | Admitting: Cardiology

## 2014-02-26 DIAGNOSIS — C50919 Malignant neoplasm of unspecified site of unspecified female breast: Secondary | ICD-10-CM

## 2014-03-01 ENCOUNTER — Ambulatory Visit (HOSPITAL_COMMUNITY)
Admission: RE | Admit: 2014-03-01 | Discharge: 2014-03-01 | Disposition: A | Discharge status: Home / Self Care | Payer: BC Managed Care – PPO | Source: Ambulatory Visit | Attending: Internal Medicine | Admitting: Internal Medicine

## 2014-03-01 ENCOUNTER — Ambulatory Visit (HOSPITAL_BASED_OUTPATIENT_CLINIC_OR_DEPARTMENT_OTHER)
Admission: RE | Admit: 2014-03-01 | Discharge: 2014-03-01 | Disposition: A | Discharge status: Home / Self Care | Payer: BC Managed Care – PPO | Source: Ambulatory Visit | Attending: Internal Medicine | Admitting: Internal Medicine

## 2014-03-01 VITALS — BP 116/68 | HR 73 | Wt 236.5 lb

## 2014-03-01 DIAGNOSIS — C50919 Malignant neoplasm of unspecified site of unspecified female breast: Secondary | ICD-10-CM

## 2014-03-01 DIAGNOSIS — Z09 Encounter for follow-up examination after completed treatment for conditions other than malignant neoplasm: Secondary | ICD-10-CM

## 2014-03-01 DIAGNOSIS — E785 Hyperlipidemia, unspecified: Secondary | ICD-10-CM

## 2014-03-01 DIAGNOSIS — I10 Essential (primary) hypertension: Secondary | ICD-10-CM

## 2014-03-01 DIAGNOSIS — C50419 Malignant neoplasm of upper-outer quadrant of unspecified female breast: Secondary | ICD-10-CM

## 2014-03-01 NOTE — Progress Notes (Signed)
Echocardiogram 2D Echocardiogram has been performed.  Julia Zuniga 03/01/2014, 9:14 AM

## 2014-03-01 NOTE — Progress Notes (Signed)
Patient ID: Julia Zuniga, female   DOB: [birth year], 53 y.o.   MRN: [id number]  Referring Physician: Dr. Humphrey Rolls Primary Care: Dr. Tami Lin  HPI: Julia Zuniga ia a 53 y.o. 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 HER-2/neu was amplified at 3.88.  S/P R lumpectomy on 12/31/12.  +family history of CAD.  She had chemotherapy with  taxotere, carboplatinum and herceptin. Also completed XRT.  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 Herceptin treatment was stopped in 1/15 and she will not receive any more Herceptin.   Echos: 01/06/13: EF 55-60%, lat s' 9.2 03/26/13: EF 55-60% lat s' 9.8 06/18/13 EF 55--60 % lat s' 9.7 Global Strain -15.8 09/09/13 EF 60% lat s' 9.6% GS - 20% 12/24/13 EF 50%, lat s' 9.4 GS -17 03/01/14 EF 55-60%, lateral s' 9.3, GLS not done  Follow up: Now off Herceptin for good (since 1/15).  Doing well.  No exertional dyspnea or chest pain.  No tachypalpitations.     Review of Systems: All pertinent positives and negatives as in HPI, otherwise negative.   Past Medical History  Diagnosis Date  . Hyperlipidemia   . Diverticulitis     2012  . Heartburn   . Anxiety   . Hot flashes   . PONV (postoperative nausea and vomiting)   . Breast cancer 12/08/12    Right  . History of radiation therapy 05/13/13-06/18/13    right breast, 5000 cGy 25 sessions  . Anemia   . 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 GERD (gastroesophageal reflux disease)   . Hypertension     Current Outpatient Prescriptions  Medication Sig Dispense Refill  . atorvastatin (LIPITOR) 40 MG tablet TAKE ONE TABLET BY MOUTH ONCE DAILY IN THE EVENING  30 tablet  6  . B Complex Vitamins (VITAMIN B COMPLEX PO) Take 1 tablet by mouth daily.      . carvedilol (COREG) 6.25 MG tablet Take 1 tablet (6.25 mg total) by mouth 2 (two)  times daily with a meal.  60 tablet  6  . Cholecalciferol (D3-1000) 1000 UNITS capsule Take 1,000 Units by mouth daily.      Marland Kitchen HYDROcodone-acetaminophen (NORCO/VICODIN) 5-325 MG per tablet Take 1-2 tablets by mouth every 4 (four) hours as needed.  15 tablet  0  . ibuprofen (ADVIL,MOTRIN) 200 MG tablet Take 400 mg by mouth every 6 (six) hours as needed. Pain      . LORazepam (ATIVAN) 0.5 MG tablet Take 0.5 mg by mouth as needed for anxiety.      . Multiple Vitamins-Minerals (MULTIVITAMIN WITH MINERALS) tablet Take 1 tablet by mouth daily.      Marland Kitchen omeprazole (PRILOSEC) 40 MG capsule Take 1 capsule (40 mg total) by mouth daily.  90 capsule  2  . Probiotic Product (PROBIOTIC DAILY PO) Take 1 tablet by mouth daily.       No current facility-administered medications for this encounter.    Allergies  Allergen Reactions  . Chlorhexidine     Patient skin gets bright red with rash.      . Codeine Swelling  . Medralone [Methylprednisolone Acetate]     "coming out of skin"   . Oxycodone-Acetaminophen Other (See Comments)    Face got red and hot    Filed Vitals:   03/01/14 1003  BP:  116/68  Pulse: 73  Weight: 236 lb 8 oz (107.276 kg)  SpO2: 98%    PHYSICAL EXAM: General:  Well appearing. No respiratory difficulty HEENT: normal Neck: supple. no JVD. Carotids 2+ bilat; no bruits. No lymphadenopathy or thryomegaly appreciated. Cor: PMI nondisplaced. Regular rate & rhythm. No rubs, gallops or murmurs. Lungs: clear Abdomen: obese, soft, nontender, nondistended. No hepatosplenomegaly. No bruits or masses. Good bowel sounds. Extremities: no cyanosis, clubbing, rash, edema Neuro: alert & oriented x 3, cranial nerves grossly intact. moves all 4 extremities w/o difficulty. Affect pleasant.   ASSESSMENT & PLAN:  Breast Cancer with Herceptin therapy.  She is now off Herceptin since 1/15 and will remain off.  EF down to 50% on last echo.  I reviewed her echo today: EF up to 55-60% with stable  lateral s'.  Doing well overall.  She can continue Coreg as she is on this for BP and palpitations as well.  She does not need further echoes since function has normalized and she has completed Herceptin.    Julia Zuniga 03/01/2014

## 2014-03-05 ENCOUNTER — Ambulatory Visit (INDEPENDENT_AMBULATORY_CARE_PROVIDER_SITE_OTHER): Payer: BC Managed Care – PPO | Admitting: General Surgery

## 2014-03-05 VITALS — BP 120/68 | HR 76 | Temp 99.0°F | Resp 18 | Ht 66.0 in | Wt 235.0 lb

## 2014-03-05 DIAGNOSIS — C50419 Malignant neoplasm of upper-outer quadrant of unspecified female breast: Secondary | ICD-10-CM

## 2014-03-05 NOTE — Patient Instructions (Signed)
Follow up with me in 11 months.  Get mammogram as scheduled at 1 year.

## 2014-03-05 NOTE — Progress Notes (Signed)
HISTORY: Patient is a 53 year old female who is status post Port-A-Cath removal. She did have some swelling but this has resolved. She is not having any pain. Denies fevers chills. She has not had any wound drainage. She is due for followup at the cancer center in August of this year.    EXAM: General:  Alert and oriented.   Incision:  Healing well.  No significant hematoma.    PATHOLOGY: n/a   ASSESSMENT AND PLAN:   Cancer of upper-outer quadrant of female breast, cT1N0, -/-/+, Ki 67 80% Doing well.  No clinical evidence of disease.  Follow up in 11 months.        Milus Height, MD Surgical Oncology, [facility]  DOOLITTLE, Linton Ham, MD No ref. provider found

## 2014-03-05 NOTE — Assessment & Plan Note (Signed)
Doing well.  No clinical evidence of disease.  Follow up in 11 months.

## 2014-03-26 ENCOUNTER — Encounter: Payer: Self-pay | Admitting: Gastroenterology

## 2014-03-26 ENCOUNTER — Ambulatory Visit (AMBULATORY_SURGERY_CENTER): Payer: BC Managed Care – PPO | Admitting: Gastroenterology

## 2014-03-26 VITALS — BP 152/80 | HR 71 | Temp 98.4°F | Resp 19 | Ht 66.0 in | Wt 232.0 lb

## 2014-03-26 DIAGNOSIS — D129 Benign neoplasm of anus and anal canal: Secondary | ICD-10-CM

## 2014-03-26 DIAGNOSIS — D128 Benign neoplasm of rectum: Secondary | ICD-10-CM

## 2014-03-26 DIAGNOSIS — Z1211 Encounter for screening for malignant neoplasm of colon: Secondary | ICD-10-CM

## 2014-03-26 DIAGNOSIS — D126 Benign neoplasm of colon, unspecified: Secondary | ICD-10-CM

## 2014-03-26 DIAGNOSIS — K5730 Diverticulosis of large intestine without perforation or abscess without bleeding: Secondary | ICD-10-CM

## 2014-03-26 MED ORDER — SODIUM CHLORIDE 0.9 % IV SOLN: 500.0000 mL | INTRAVENOUS | Status: DC

## 2014-03-26 NOTE — Op Note (Signed)
[facility]  [address] [place], [address]   COLONOSCOPY PROCEDURE REPORT  PATIENT: Julia Zuniga, Julia Zuniga  MR#: [id number] BIRTHDATE: 10-19-1961 , 52  yrs. old GENDER: Female ENDOSCOPIST: Milus Banister, MD REFERRED RX:[id number] Doolittle, M.D. PROCEDURE DATE:  03/26/2014 PROCEDURE:   Colonoscopy with snare polypectomy First Screening Colonoscopy - Avg.  risk and is 50 yrs.  old or older Yes.  Prior Negative Screening - Now for repeat screening. N/A  History of Adenoma - Now for follow-up colonoscopy & has been > or = to 3 yrs.  N/A  Polyps Removed Today? Yes. ASA CLASS:   Class II INDICATIONS:average risk screening. MEDICATIONS: MAC sedation, administered by CRNA and propofol (Diprivan) 300mg  IV  DESCRIPTION OF PROCEDURE:   After the risks benefits and alternatives of the procedure were thoroughly explained, informed consent was obtained.  A digital rectal exam revealed no abnormalities of the rectum.   The [id number]  endoscope was introduced through the anus and advanced to the cecum, which was identified by both the appendix and ileocecal valve. No adverse events experienced.   The quality of the prep was good.  The instrument was then slowly withdrawn as the colon was fully examined.  COLON FINDINGS: One polyp was found, removed and sent to pathology. This was sessile, 65mm across, located in rectum, removed with cold snare and sent to pathology.  There were diverticulosis changes in the left colon.  The examination was otherwise normal.  Retroflexed views revealed no abnormalities. The time to cecum=6 minutes 07 seconds.  Withdrawal time=7 minutes 05 seconds.  The scope was withdrawn and the procedure completed. COMPLICATIONS: There were no complications.  ENDOSCOPIC IMPRESSION: One polyp was found, removed and sent to pathology. There were diverticulosis changes in the left colon. The examination was otherwise normal.  RECOMMENDATIONS: If the  polyp(s) removed today are proven to be adenomatous (pre-cancerous) polyps, you will need a repeat colonoscopy in 5 years.  Otherwise you should continue to follow colorectal cancer screening guidelines for "routine risk" patients with colonoscopy in 10 years.  You will receive a letter within 1-2 weeks with the results of your biopsy as well as final recommendations.  Please call my office if you have not received a letter after 3 weeks.   eSigned:  Milus Banister, MD 03/26/2014 10:41 AM

## 2014-03-26 NOTE — Patient Instructions (Signed)
YOU HAD AN ENDOSCOPIC PROCEDURE TODAY AT THE [facility]: Refer to the procedure report that was given to you for any specific questions about what was found during the examination.  If the procedure report does not answer your questions, please call your gastroenterologist to clarify.  If you requested that your care partner not be given the details of your procedure findings, then the procedure report has been included in a sealed envelope for you to review at your convenience later.  YOU SHOULD EXPECT: Some feelings of bloating in the abdomen. Passage of more gas than usual.  Walking can help get rid of the air that was put into your GI tract during the procedure and reduce the bloating. If you had a lower endoscopy (such as a colonoscopy or flexible sigmoidoscopy) you may notice spotting of blood in your stool or on the toilet paper. If you underwent a bowel prep for your procedure, then you may not have a normal bowel movement for a few days.  DIET: Your first meal following the procedure should be a light meal and then it is ok to progress to your normal diet.  A half-sandwich or bowl of soup is an example of a good first meal.  Heavy or fried foods are harder to digest and may make you feel nauseous or bloated.  Likewise meals heavy in dairy and vegetables can cause extra gas to form and this can also increase the bloating.  Drink plenty of fluids but you should avoid alcoholic beverages for 24 hours.  ACTIVITY: Your care partner should take you home directly after the procedure.  You should plan to take it easy, moving slowly for the rest of the day.  You can resume normal activity the day after the procedure however you should NOT DRIVE or use heavy machinery for 24 hours (because of the sedation medicines used during the test).    SYMPTOMS TO REPORT IMMEDIATELY: A gastroenterologist can be reached at any hour.  During normal business hours, 8:30 AM to 5:00 PM Monday through Friday,  call [phone].  After hours and on weekends, please call the GI answering service at [phone] who will take a message and have the physician on call contact you.   Following lower endoscopy (colonoscopy or flexible sigmoidoscopy):  Excessive amounts of blood in the stool  Significant tenderness or worsening of abdominal pains  Swelling of the abdomen that is new, acute  Fever of 100F or higher  FOLLOW UP: If any biopsies were taken you will be contacted by phone or by letter within the next 1-3 weeks.  Call your gastroenterologist if you have not heard about the biopsies in 3 weeks.  Our staff will call the home number listed on your records the next business day following your procedure to check on you and address any questions or concerns that you may have at that time regarding the information given to you following your procedure. This is a courtesy call and so if there is no answer at the home number and we have not heard from you through the emergency physician on call, we will assume that you have returned to your regular daily activities without incident.  SIGNATURES/CONFIDENTIALITY: You and/or your care partner have signed paperwork which will be entered into your electronic medical record.  These signatures attest to the fact that that the information above on your After Visit Summary has been reviewed and is understood.  Full responsibility of the confidentiality of this   discharge information lies with you and/or your care-partner.  Please continue your normal medications  Please read over handouts about polyps, diverticulosis and high fiber diets  Await pathology

## 2014-03-26 NOTE — Progress Notes (Signed)
Procedure ends, to recovery, report given and VSS. 

## 2014-03-26 NOTE — Progress Notes (Signed)
Called to room to assist during endoscopic procedure.  Patient ID and intended procedure confirmed with present staff. Received instructions for my participation in the procedure from the performing physician.  

## 2014-03-29 ENCOUNTER — Telehealth: Payer: Self-pay

## 2014-03-29 NOTE — Telephone Encounter (Signed)
  Follow up Call-  Call back number 03/26/2014  Post procedure Call Back phone  # [phone]  Permission to leave phone message Yes     Patient questions:  Do you have a fever, pain , or abdominal swelling? no Pain Score  0 *  Have you tolerated food without any problems? yes  Have you been able to return to your normal activities? yes  Do you have any questions about your discharge instructions: Diet   no Medications  no Follow up visit  no  Do you have questions or concerns about your Care? no  Actions: * If pain score is 4 or above: No action needed, pain <4.  No problems per the pt. Maw

## 2014-03-31 ENCOUNTER — Encounter: Payer: Self-pay | Admitting: Gastroenterology

## 2014-05-24 ENCOUNTER — Telehealth: Payer: Self-pay | Admitting: Oncology

## 2014-05-24 NOTE — Telephone Encounter (Signed)
pt cld stating recvd letter and wanted to see GM instead of VG. R/s pt for GM in Sept gave pt time & date-pt understood

## 2014-05-29 ENCOUNTER — Ambulatory Visit (INDEPENDENT_AMBULATORY_CARE_PROVIDER_SITE_OTHER): Payer: BC Managed Care – PPO | Admitting: Physician Assistant

## 2014-05-29 VITALS — BP 126/86 | HR 94 | Temp 98.7°F | Ht 65.5 in | Wt 245.0 lb

## 2014-05-29 DIAGNOSIS — H6980 Other specified disorders of Eustachian tube, unspecified ear: Secondary | ICD-10-CM

## 2014-05-29 DIAGNOSIS — I10 Essential (primary) hypertension: Secondary | ICD-10-CM

## 2014-05-29 DIAGNOSIS — F411 Generalized anxiety disorder: Secondary | ICD-10-CM | POA: Insufficient documentation

## 2014-05-29 DIAGNOSIS — H6990 Unspecified Eustachian tube disorder, unspecified ear: Secondary | ICD-10-CM

## 2014-05-29 DIAGNOSIS — E785 Hyperlipidemia, unspecified: Secondary | ICD-10-CM

## 2014-05-29 DIAGNOSIS — H6983 Other specified disorders of Eustachian tube, bilateral: Secondary | ICD-10-CM

## 2014-05-29 DIAGNOSIS — F419 Anxiety disorder, unspecified: Secondary | ICD-10-CM

## 2014-05-29 MED ORDER — ATORVASTATIN CALCIUM 40 MG PO TABS: ORAL_TABLET | ORAL | Status: DC

## 2014-05-29 MED ORDER — CARVEDILOL 6.25 MG PO TABS: 6.2500 mg | ORAL_TABLET | Freq: Two times a day (BID) | ORAL | Status: DC

## 2014-05-29 MED ORDER — LORAZEPAM 0.5 MG PO TABS: 0.5000 mg | ORAL_TABLET | Freq: Three times a day (TID) | ORAL | Status: DC | PRN

## 2014-05-29 MED ORDER — IPRATROPIUM BROMIDE 0.06 % NA SOLN: 2.0000 | Freq: Three times a day (TID) | NASAL | Status: DC

## 2014-05-29 NOTE — Progress Notes (Signed)
   Subjective:    Patient ID: Julia Zuniga, female    DOB: Aug 31, 1961, 53 y.o.   MRN: [id number]  HPI Pt presents to clinic with bilateral ear fullness - no real pain, no tinnitus, no recent colds or allergy symptoms.  She also would like to have refills of her medications - she had been going to the cardiologist due to some complications of her chemotherapy but the cardiologist says she is 1 year out from her chemo and she no longer needs to see them.  She feels good.  She tried Mucinex D last pm but it made her feel bad and jittery without sleep.  She does a BP monitor at home to check her BP.  Review of Systems  Constitutional: Negative for fever and chills.  HENT: Positive for ear pain. Negative for congestion, ear discharge and hearing loss.   Cardiovascular: Negative for chest pain and leg swelling.  Allergic/Immunologic: Negative for environmental allergies.  Psychiatric/Behavioral: Positive for sleep disturbance (2nd to med SE).       Objective:   Physical Exam  Vitals reviewed. Constitutional: She is oriented to person, place, and time. She appears well-developed and well-nourished.  HENT:  Head: Normocephalic and atraumatic.  Right Ear: Hearing, external ear and ear canal normal. Tympanic membrane is bulging. Tympanic membrane is not injected and not erythematous. A middle ear effusion (serous fluid) is present.  Left Ear: Hearing, external ear and ear canal normal. Tympanic membrane is bulging. Tympanic membrane is not injected and not erythematous. A middle ear effusion (serous fluid) is present.  Nose: Nose normal.  Mouth/Throat: Uvula is midline, oropharynx is clear and moist and mucous membranes are normal.  Small nasal passageways  Eyes: Conjunctivae are normal.  Neck: Normal range of motion. Neck supple.  Cardiovascular: Normal rate, regular rhythm and normal heart sounds.   No murmur heard. Pulmonary/Chest: Effort normal and breath sounds normal. She has no wheezes.   Neurological: She is alert and oriented to person, place, and time.  Skin: Skin is warm and dry.  Psychiatric: She has a normal mood and affect. Her behavior is normal. Judgment and thought content normal.       Assessment & Plan:  Unspecified essential hypertension - Plan: carvedilol (COREG) 6.25 MG tablet  ETD (eustachian tube dysfunction), bilateral - Plan: ipratropium (ATROVENT) 0.06 % nasal spray  Anxiety - Plan: LORazepam (ATIVAN) 0.5 MG tablet  Hyperlipidemia - Plan: atorvastatin (LIPITOR) 40 MG tablet  Pt has ETD and she will use quick release sudafed in the am and monitor her BP while on the medications - she is aware that it may take 2-3 weeks for resolution.  She will make an appt with Dr Laney Pastor for her regular care.  Her BP is well controlled today and she will continue to monitor.  She last had her cholesterol done with Dr Matthew Saras and states that her levels were normal so we will wait for her appt when we will order lab work.  Windell Hummingbird PA-C  [facility] 05/29/2014 9:11 AM

## 2014-06-02 NOTE — Progress Notes (Signed)
Spoke with patient and scheduled appointment on September 2 at 1115.

## 2014-08-02 ENCOUNTER — Ambulatory Visit: Payer: BC Managed Care – PPO | Admitting: Oncology

## 2014-08-02 ENCOUNTER — Other Ambulatory Visit: Payer: BC Managed Care – PPO

## 2014-08-10 ENCOUNTER — Other Ambulatory Visit: Payer: Self-pay | Admitting: Oncology

## 2014-08-10 ENCOUNTER — Other Ambulatory Visit: Payer: Self-pay | Admitting: Internal Medicine

## 2014-08-10 DIAGNOSIS — Z853 Personal history of malignant neoplasm of breast: Secondary | ICD-10-CM

## 2014-08-11 ENCOUNTER — Ambulatory Visit (INDEPENDENT_AMBULATORY_CARE_PROVIDER_SITE_OTHER): Payer: BC Managed Care – PPO | Admitting: Internal Medicine

## 2014-08-11 VITALS — BP 131/86 | HR 93 | Temp 98.4°F | Resp 16 | Ht 66.0 in | Wt 248.0 lb

## 2014-08-11 DIAGNOSIS — C50411 Malignant neoplasm of upper-outer quadrant of right female breast: Secondary | ICD-10-CM

## 2014-08-11 DIAGNOSIS — IMO0001 Reserved for inherently not codable concepts without codable children: Secondary | ICD-10-CM

## 2014-08-11 DIAGNOSIS — F411 Generalized anxiety disorder: Secondary | ICD-10-CM

## 2014-08-11 DIAGNOSIS — K2100 Gastro-esophageal reflux disease with esophagitis, without bleeding: Secondary | ICD-10-CM

## 2014-08-11 DIAGNOSIS — E785 Hyperlipidemia, unspecified: Secondary | ICD-10-CM

## 2014-08-11 DIAGNOSIS — C50419 Malignant neoplasm of upper-outer quadrant of unspecified female breast: Secondary | ICD-10-CM

## 2014-08-11 DIAGNOSIS — K219 Gastro-esophageal reflux disease without esophagitis: Secondary | ICD-10-CM | POA: Insufficient documentation

## 2014-08-11 DIAGNOSIS — F419 Anxiety disorder, unspecified: Secondary | ICD-10-CM

## 2014-08-11 DIAGNOSIS — I10 Essential (primary) hypertension: Secondary | ICD-10-CM

## 2014-08-11 MED ORDER — OMEPRAZOLE 40 MG PO CPDR: 40.0000 mg | DELAYED_RELEASE_CAPSULE | Freq: Every day | ORAL | Status: DC

## 2014-08-11 MED ORDER — LORAZEPAM 0.5 MG PO TABS: 0.5000 mg | ORAL_TABLET | Freq: Three times a day (TID) | ORAL | Status: DC | PRN

## 2014-08-11 NOTE — Progress Notes (Signed)
Subjective:    Patient ID: Julia Zuniga, female    DOB: [birth year], 53 y.o.   MRN: [id number]  HPIf/u Patient Active Problem List   Diagnosis Date Noted  . Anxiety--irreg need for meds//privately some anxiety every day needing medication  She does not want to start antidepressant/anti-anxiety daily therapy with another medication since this works so well  05/29/2014  . Essential hypertension--coreg cause also "flutters"///blood pressure stable  01/07/2013  . Hyperlipidemia herceptin was part of rx for breast cancer and she was put on lipid medicine to help with that as well  01/07/2013  . Cancer of upper-outer quadrant of female breast, cT1N0, -/-/+, Ki 67 80% 12/12/2012   Neuropathy--feet mainly and hands  Ongoing treatment with Dr. Jana Hakim now that Orma Render gone   -  GERD--40 works well/ this got worse during chemotherapy  Prior to Admission medications   Medication Sig Start Date End Date Taking? Authorizing Provider  atorvastatin (LIPITOR) 40 MG tablet TAKE ONE TABLET BY MOUTH ONCE DAILY IN THE EVENING 05/29/14  Yes Mancel Bale, PA-C  B Complex Vitamins (VITAMIN B COMPLEX PO) Take 1 tablet by mouth daily.   Yes Historical Provider, MD  carvedilol (COREG) 6.25 MG tablet Take 1 tablet (6.25 mg total) by mouth 2 (two) times daily with a meal. 05/29/14  Yes Mancel Bale, PA-C  Cholecalciferol (D3-1000) 1000 UNITS capsule Take 1,000 Units by mouth daily.   Yes Historical Provider, MD  HYDROcodone-acetaminophen (NORCO/VICODIN) 5-325 MG per tablet  02/26/14  Yes Historical Provider, MD  ibuprofen (ADVIL,MOTRIN) 200 MG tablet Take 400 mg by mouth every 6 (six) hours as needed. Pain   Yes Historical Provider, MD  LORazepam (ATIVAN) 0.5 MG tablet Take 1 tablet (0.5 mg total) by mouth 3 (three) times daily as needed for anxiety. 05/29/14 Using prn Yes Mancel Bale, PA-C  Multiple Vitamins-Minerals (MULTIVITAMIN WITH MINERALS) tablet Take 1 tablet by mouth daily.   Yes Historical Provider, MD    omeprazole (PRILOSEC) 40 MG capsule Take 1 capsule (40 mg total) by mouth daily. 10/28/13 out Yes Deatra Robinson, MD  Probiotic Product (PROBIOTIC DAILY PO) Take 1 tablet by mouth daily.   Yes Historical Provider, MD  ipratropium (ATROVENT) 0.06 % nasal spray Place 2 sprays into the nose 3 (three) times daily. 05/29/14   Mancel Bale, PA-C     Gyn Holland---checks her choles in nov colonos at 50=ok  Review of Systems  Constitutional:       She is struggling to lose weight because she cannot now a consistent exercise program due to her neuropathy which affects her balance as well as her use of her legs for strength, and because she has developed arthralgias in several joints since she had her chemotherapy. She also continues with fatigue posttreatment  HENT: Negative for hearing loss and trouble swallowing.   Eyes: Negative for visual disturbance.  Respiratory: Negative for shortness of breath.   Cardiovascular: Negative for chest pain, palpitations and leg swelling.  Gastrointestinal: Negative for abdominal pain.       She has intermittent GI symptoms still which fit a possible diagnosis of IBS since her endoscopy and colonoscopy were done within the last year without significant findings  Genitourinary: Negative for difficulty urinating.  Neurological: Negative for speech difficulty and headaches.  Psychiatric/Behavioral: Negative for behavioral problems and dysphoric mood.       Objective:   Physical Exam  Nursing note and vitals reviewed. Constitutional: She is oriented to person, place,  and time. She appears well-developed and well-nourished. No distress.  HENT:  Head: Normocephalic and atraumatic.  Eyes: Pupils are equal, round, and reactive to light.  Neck: Normal range of motion.  Cardiovascular: Normal rate and regular rhythm.   Pulmonary/Chest: Effort normal. No respiratory distress.  Musculoskeletal: Normal range of motion.  Neurological: She is alert and oriented to  person, place, and time.  Skin: Skin is warm and dry.  Psychiatric: She has a normal mood and affect. Her behavior is normal.   BP 131/86  Pulse 93  Temp(Src) 98.4 F (36.9 C)  Resp 16  Ht 5\' 6"  (1.676 m)  Wt 248 lb (112.492 kg)  BMI 40.05 kg/m2  SpO2 96%  LMP 01/07/2009     Assessment & Plan:  Reflux - Plan: omeprazole (PRILOSEC) 40 MG capsule  Anxiety - Plan: LORazepam (ATIVAN) 0.5 MG tablet  Cancer of upper-outer quadrant of female breast, right  Essential hypertension  Hyperlipidemia  Gastroesophageal reflux disease with esophagitis  Meds ordered this encounter  Medications  . omeprazole (PRILOSEC) 40 MG capsule    Sig: Take 1 capsule (40 mg total) by mouth daily.    Dispense:  90 capsule    Refill:  3  . LORazepam (ATIVAN) 0.5 MG tablet    Sig: Take 1 tablet (0.5 mg total) by mouth 3 (three) times daily as needed for anxiety.    Dispense:  60 tablet    Refill:  5   Followup 6 months Labs her by gynecology usually Call for other meds if needed prior to followup Encouraged nonweightbearing exercises

## 2014-09-06 ENCOUNTER — Other Ambulatory Visit (HOSPITAL_BASED_OUTPATIENT_CLINIC_OR_DEPARTMENT_OTHER): Payer: BC Managed Care – PPO

## 2014-09-06 ENCOUNTER — Ambulatory Visit (HOSPITAL_BASED_OUTPATIENT_CLINIC_OR_DEPARTMENT_OTHER): Payer: BC Managed Care – PPO | Admitting: Oncology

## 2014-09-06 ENCOUNTER — Telehealth: Payer: Self-pay | Admitting: Oncology

## 2014-09-06 VITALS — BP 123/77 | HR 88 | Temp 98.9°F | Resp 18 | Ht 66.0 in | Wt 254.7 lb

## 2014-09-06 DIAGNOSIS — Z171 Estrogen receptor negative status [ER-]: Secondary | ICD-10-CM

## 2014-09-06 DIAGNOSIS — D509 Iron deficiency anemia, unspecified: Secondary | ICD-10-CM

## 2014-09-06 DIAGNOSIS — E785 Hyperlipidemia, unspecified: Secondary | ICD-10-CM

## 2014-09-06 DIAGNOSIS — I10 Essential (primary) hypertension: Secondary | ICD-10-CM

## 2014-09-06 DIAGNOSIS — F419 Anxiety disorder, unspecified: Secondary | ICD-10-CM

## 2014-09-06 DIAGNOSIS — C50419 Malignant neoplasm of upper-outer quadrant of unspecified female breast: Secondary | ICD-10-CM

## 2014-09-06 DIAGNOSIS — F411 Generalized anxiety disorder: Secondary | ICD-10-CM

## 2014-09-06 LAB — CBC WITH DIFFERENTIAL/PLATELET
BASO%: 1.3 % (ref 0.0–2.0)
Basophils Absolute: 0.1 10*3/uL (ref 0.0–0.1)
EOS ABS: 0.2 10*3/uL (ref 0.0–0.5)
EOS%: 1.8 % (ref 0.0–7.0)
HEMATOCRIT: 36.8 % (ref 34.8–46.6)
HGB: 11.7 g/dL (ref 11.6–15.9)
LYMPH%: 32.2 % (ref 14.0–49.7)
MCH: 24.9 pg — ABNORMAL LOW (ref 25.1–34.0)
MCHC: 31.8 g/dL (ref 31.5–36.0)
MCV: 78.5 fL — ABNORMAL LOW (ref 79.5–101.0)
MONO#: 0.5 10*3/uL (ref 0.1–0.9)
MONO%: 6.2 % (ref 0.0–14.0)
NEUT%: 58.5 % (ref 38.4–76.8)
NEUTROS ABS: 5.2 10*3/uL (ref 1.5–6.5)
PLATELETS: 236 10*3/uL (ref 145–400)
RBC: 4.69 10*6/uL (ref 3.70–5.45)
RDW: 13.7 % (ref 11.2–14.5)
WBC: 8.9 10*3/uL (ref 3.9–10.3)
lymph#: 2.9 10*3/uL (ref 0.9–3.3)

## 2014-09-06 LAB — COMPREHENSIVE METABOLIC PANEL (CC13)
ALK PHOS: 138 U/L (ref 40–150)
ALT: 27 U/L (ref 0–55)
AST: 16 U/L (ref 5–34)
Albumin: 4.1 g/dL (ref 3.5–5.0)
Anion Gap: 10 mEq/L (ref 3–11)
BILIRUBIN TOTAL: 0.31 mg/dL (ref 0.20–1.20)
BUN: 13.1 mg/dL (ref 7.0–26.0)
CO2: 27 mEq/L (ref 22–29)
Calcium: 10.3 mg/dL (ref 8.4–10.4)
Chloride: 106 mEq/L (ref 98–109)
Creatinine: 0.7 mg/dL (ref 0.6–1.1)
GLUCOSE: 107 mg/dL (ref 70–140)
Potassium: 4.2 mEq/L (ref 3.5–5.1)
SODIUM: 143 meq/L (ref 136–145)
Total Protein: 8 g/dL (ref 6.4–8.3)

## 2014-09-06 NOTE — Telephone Encounter (Signed)
per pof to sch pt appt-pt req to come same day as appt-sch and gave pt copy of sch

## 2014-09-06 NOTE — Progress Notes (Signed)
[place]  [phone] [phone]     ID: Julia Zuniga DOB: 07-24-1961  MR#: [id number]  IOX#:[id number]  Patient Care Team: Leandrew Koyanagi, MD as PCP - General (Family Medicine) PCP: Leandrew Koyanagi, MD GYN: Molli Posey MD SU: Stark Klein MD OTHER MD: Thea Silversmith MD  CHIEF COMPLAINT: Breast cancer followup  CURRENT TREATMENT: Observation   BREAST CANCER HISTORY: From doctor calcium tends 12/17/2012 intake note:  Julia Zuniga is a 53 y.o. female. With medical history significant for hyperlipidemia diverticulitis and hiatal hernia. She presented for a screening mammogram in December 2013 that showed an area of abnormality on the right. This measured about 1.6 cm. 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 HER-2/neu was amplified at 3.88. Patient did not have a MRI scan performed. Post biopsy she is doing well. She is without any complaints. Patient case was presented at the multidisciplinary breast conference. She is seen today by Dr. Thea Silversmith and Dr. Theda Sers. Patient does desire breast conservation.  The patient's subsequent history is as detailed below  INTERVAL HISTORY: Julia Zuniga returns today for followup of her invasive ductal carcinoma. She is establishing herself in my service today.  REVIEW OF SYSTEMS: The interval history since the last visit here has been generally unremarkable. Julia Zuniga complains of aches and pains "all over", particularly involving the right hip. This is very variable. Sometimes is the upper arms and comes times is a left flank. She takes occasional ibuprofen for this. She still has peripheral neuropathy issues involving her feet and hands and she fell at the time of chemotherapy. She has taken Neurontin with minimal improvement in the symptoms. She has problems with insomnia, and uses  lorazepam to help her sleep occasionally. She also uses lorazepam before coming to the cancer center as before today's visit for example. She does admit to significant anxiety but denies depression. Hot flashes are a minor issue. She does not complain of vaginal dryness problems. A detailed review of systems was otherwise noncontributory.  PAST MEDICAL HISTORY: Past Medical History  Diagnosis Date  . Hyperlipidemia   . Diverticulitis     2012  . Heartburn   . Anxiety   . Hot flashes   . PONV (postoperative nausea and vomiting)   . Breast cancer 12/08/12    Right  . History of radiation therapy 05/13/13-06/18/13    right breast, 5000 cGy 25 sessions  . Anemia   . 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 GERD (gastroesophageal reflux disease)   . Hypertension     PAST SURGICAL HISTORY: Past Surgical History  Procedure Laterality Date  . Cesarean section      x 3  . Tubal ligation    . Tonsillectomy and adenoidectomy    . Cholecystectomy  2001    lap choli  . Breast lumpectomy with sentinel lymph node biopsy  12/31/2012    Procedure: BREAST LUMPECTOMY WITH SENTINEL LYMPH NODE BX;  Surgeon: Stark Klein, MD;  Location: [place];  Service: General;  Laterality: Right;  . Portacath placement  12/31/2012    Procedure: INSERTION PORT-A-CATH;  Surgeon: Stark Klein, MD;  Location: [place];  Service: General;  Laterality: Left;  Left Subclavian Vein  . Re-excision of breast cancer,superior margins  01/14/2013    Procedure: RE-EXCISION OF BREAST CANCER,SUPERIOR  MARGINS;  Surgeon: Stark Klein, MD;  Location: [facility];  Service: General;  Laterality: Right;  right breast re excision for markers   . Port-a-cath removal Left 02/24/2014    Procedure: REMOVAL PORT-A-CATH;  Surgeon: Stark Klein, MD;  Location: [place] [facility];  Service: General;  Laterality: Left;    FAMILY HISTORY Family History  Problem Relation Age of Onset  .  Stomach cancer Father   . Lung cancer Maternal Uncle   . Skin cancer Maternal Grandmother   . Lung cancer Maternal Grandfather   . Lymphoma Cousin   . Colon cancer Neg Hx   . Esophageal cancer Neg Hx   . Rectal cancer Neg Hx    The patient's father died at the age of 87 from stomach cancer. The patient's mother is alive at age 64. Julia Zuniga is a single child. There is no history of breast or ovarian cancer in the family to her knowledge.  GYNECOLOGIC HISTORY:  Patient's last menstrual period was 01/07/2009. Menarche age 60, first live birth age 64. She is GX P3. Her periods have stopped before she started chemotherapy. She never took hormone replacement.  SOCIAL HISTORY:  She is a Agricultural engineer. She has a daughter from her first marriage, Julia Zuniga, 52 years old, lives in [place] and works as a Pharmacist, hospital. Her second husband, Julia Zuniga, works as a Dealer. They have 2 daughters,Julia Zuniga, 31 years old, currently attending [employer], and Julia Zuniga, currently 15, at home. The patient attends a [employer].    ADVANCED DIRECTIVES: Not in place   HEALTH MAINTENANCE: History  Substance Use Topics  . Smoking status: Never Smoker   . Smokeless tobacco: Never Used  . Alcohol Use: Yes     Comment: occ     Colonoscopy:  PAP:  Bone density:  Lipid panel:  Allergies  Allergen Reactions  . Chlorhexidine     Patient skin gets bright red with rash.      . Codeine Swelling  . Medralone [Methylprednisolone Acetate]     "coming out of skin"   . Oxycodone-Acetaminophen Other (See Comments)    Face got red and hot    Current Outpatient Prescriptions  Medication Sig Dispense Refill  . atorvastatin (LIPITOR) 40 MG tablet TAKE ONE TABLET BY MOUTH ONCE DAILY IN THE EVENING  90 tablet  1  . B Complex Vitamins (VITAMIN B COMPLEX PO) Take 1 tablet by mouth daily.      . carvedilol (COREG) 6.25 MG tablet Take 1 tablet (6.25 mg total) by mouth 2 (two) times daily with a meal.  180 tablet  1  .  Cholecalciferol (D3-1000) 1000 UNITS capsule Take 1,000 Units by mouth daily.      Marland Kitchen HYDROcodone-acetaminophen (NORCO/VICODIN) 5-325 MG per tablet       . ibuprofen (ADVIL,MOTRIN) 200 MG tablet Take 400 mg by mouth every 6 (six) hours as needed. Pain      . ipratropium (ATROVENT) 0.06 % nasal spray Place 2 sprays into the nose 3 (three) times daily.  15 mL  0  . LORazepam (ATIVAN) 0.5 MG tablet Take 1 tablet (0.5 mg total) by mouth 3 (three) times daily as needed for anxiety.  60 tablet  5  . Multiple Vitamins-Minerals (MULTIVITAMIN WITH MINERALS) tablet Take 1 tablet by mouth daily.      Marland Kitchen omeprazole (PRILOSEC) 40 MG capsule Take 1 capsule (40 mg total) by mouth daily.  90 capsule  3  . Probiotic Product (PROBIOTIC DAILY PO) Take 1  tablet by mouth daily.       No current facility-administered medications for this visit.    OBJECTIVE: Middle-aged white woman who appears younger than stated age  25 Vitals:   09/06/14 1132  BP: 123/77  Pulse: 88  Temp: 98.9 F (37.2 C)  Resp: 18     Body mass index is 41.13 kg/(m^2).    ECOG FS:1 - Symptomatic but completely ambulatory  Ocular: Sclerae unicteric, pupils equal, round and reactive to light Ear-nose-throat: Oropharynx clear, dentition  in good repair  Lymphatic: No cervical or supraclavicular adenopathy Lungs no rales or rhonchi, good excursion bilaterally Heart regular rate and rhythm, no murmur appreciated Abd soft,  obese, nontender, positive bowel sounds MSK no focal spinal tenderness to moderate palpation  Neuro: non-focal, well-oriented, appropriate affect Breasts: The right breast is status post lumpectomy and radiation. There is no evidence of local recurrence. The right axilla is benign. The left breast is unremarkable.   LAB RESULTS:  CMP     Component Value Date/Time   NA 143 09/06/2014 1111   NA 143 02/24/2014 0802   K 4.2 09/06/2014 1111   K 4.2 02/24/2014 0802   CL 106 02/24/2014 0802   CL 106 06/01/2013 1045   CO2  27 09/06/2014 1111   CO2 29 02/24/2013 1857   GLUCOSE 107 09/06/2014 1111   GLUCOSE 96 02/24/2014 0802   GLUCOSE 94 06/01/2013 1045   BUN 13.1 09/06/2014 1111   BUN 11 02/24/2014 0802   CREATININE 0.7 09/06/2014 1111   CREATININE 0.60 02/24/2014 0802   CALCIUM 10.3 09/06/2014 1111   CALCIUM 9.2 02/24/2013 1857   PROT 8.0 09/06/2014 1111   PROT 6.8 02/24/2013 1857   ALBUMIN 4.1 09/06/2014 1111   ALBUMIN 3.8 02/24/2013 1857   AST 16 09/06/2014 1111   AST 18 02/24/2013 1857   ALT 27 09/06/2014 1111   ALT 30 02/24/2013 1857   ALKPHOS 138 09/06/2014 1111   ALKPHOS 113 02/24/2013 1857   BILITOT 0.31 09/06/2014 1111   BILITOT 0.2* 02/24/2013 1857   GFRNONAA >90 02/24/2013 1857   GFRAA >90 02/24/2013 1857    I No results found for this basename: SPEP, UPEP,  kappa and lambda light chains    Lab Results  Component Value Date   WBC 8.9 09/06/2014   NEUTROABS 5.2 09/06/2014   HGB 11.7 09/06/2014   HCT 36.8 09/06/2014   MCV 78.5* 09/06/2014   PLT 236 09/06/2014      Chemistry      Component Value Date/Time   NA 143 09/06/2014 1111   NA 143 02/24/2014 0802   K 4.2 09/06/2014 1111   K 4.2 02/24/2014 0802   CL 106 02/24/2014 0802   CL 106 06/01/2013 1045   CO2 27 09/06/2014 1111   CO2 29 02/24/2013 1857   BUN 13.1 09/06/2014 1111   BUN 11 02/24/2014 0802   CREATININE 0.7 09/06/2014 1111   CREATININE 0.60 02/24/2014 0802      Component Value Date/Time   CALCIUM 10.3 09/06/2014 1111   CALCIUM 9.2 02/24/2013 1857   ALKPHOS 138 09/06/2014 1111   ALKPHOS 113 02/24/2013 1857   AST 16 09/06/2014 1111   AST 18 02/24/2013 1857   ALT 27 09/06/2014 1111   ALT 30 02/24/2013 1857   BILITOT 0.31 09/06/2014 1111   BILITOT 0.2* 02/24/2013 1857       Lab Results  Component Value Date   LABCA2 38 12/17/2012    No components found with this basename:  [id number]    No results found for this basename: INR,  in the last 168 hours  Urinalysis    Component Value Date/Time   COLORURINE YELLOW 02/24/2013 1856   APPEARANCEUR  CLEAR 02/24/2013 1856   LABSPEC 1.008 02/24/2013 1856   PHURINE 6.0 02/24/2013 1856   GLUCOSEU NEGATIVE 02/24/2013 1856   HGBUR LARGE* 02/24/2013 1856   BILIRUBINUR NEGATIVE 02/24/2013 1856   KETONESUR NEGATIVE 02/24/2013 1856   PROTEINUR NEGATIVE 02/24/2013 1856   UROBILINOGEN 0.2 02/24/2013 1856   NITRITE NEGATIVE 02/24/2013 1856   LEUKOCYTESUR NEGATIVE 02/24/2013 1856    STUDIES: CLINICAL DATA: 53 year old female with history of right breast  cancer postlumpectomy January 2014 followed by chemotherapy and  radiation.  EXAM:  DIGITAL DIAGNOSTIC LIMITED BILATERAL MAMMOGRAM WITH CAD  COMPARISON: Previous mammograms.  ACR Breast Density Category a: The breast tissue is almost entirely  fatty.  FINDINGS:  No suspicious masses or calcifications are seen in either breast.  Postsurgical changes are present in the right breast from interval  lumpectomy. A spot compression magnification tangential view of the  lumpectomy site in the right breast was performed, with no  mammographic evidence of locally recurrent disease.  Mammographic images were processed with CAD.  IMPRESSION:  No mammographic evidence of malignancy in either breast.  RECOMMENDATION:  Bilateral diagnostic mammography in 1 year.  I have discussed the findings and recommendations with the patient.  Results were also provided in writing at the conclusion of the  visit. If applicable, a reminder letter will be sent to the patient  regarding the next appointment.  BI-RADS CATEGORY 2: Benign Finding(s)  Electronically Signed  By: Everlean Alstrom M.D.  On: 11/24/2013 10:25    ASSESSMENT: 53 y.o. [place] woman  (1) status post right lumpectomy and sentinel lymph node sampling 12/31/2012 for a pT1c pN0, stage IA invasive ductal carcinoma, grade 3, estrogen and progesterone receptor negative, with an MIB-1 of 83%, and HER-2 amplified, with the signals ratio of 4.36  (2) adjuvant chemotherapy consisted of docetaxel and  carboplatin   given every 21 days x4 of 6 planned cycles, completed 03/30/2013; discontinued because of neuropathy, and given with concurrent  trastuzumab ,continued until 11/16/2013, discontinued because of a drop in  her  ejection fraction, which resolved on repeat 03/01/2014.  (3).  received  radiation therapy from 05/13/2013 through 07/10/201 (4)  (4) other problems include neuropathy, anxiety, a history of arthralgias and myalgias  (5) Iron deficiency anemia diagnosed on the basis of a low MCV, with history of prior normal MCV  PLAN: Dillon Bjork understands she has a very good prognosis overall, since chemotherapy lowers the risk of recurrence by one third and then anti-HER-2 treatment cuts the residual risk in half. She is under observation only at present and for many women not taking something to reduce the risk of breast cancer can cause a great deal of anxiety. That is the case here as well.  Things Dillon Bjork can try for anxiety, aside from the occasional Ativan or glass of wine, include moderate to vigorous exercise, distractions, and engagement in a major life project. We discussed all these options today.  I mentioned the fact that her MCV used to be normal but is now low. This likely is due to borderline iron deficiency. This can be present without frank anemia, and may or may not contribute to fatigue. We discussed iron replacement and she is going to try oral replacement first. If that does not work she will let us know and we can  always do intravenous replacement as needed.  Since you're ready sees Dr. Matthew Saras after her mammogram in December and Dr. Laney Pastor in the fall, she can see me next may and we will continue to see her here on a once a year basis until she completes her 5 years of followup.  The patient has a good understanding of the overall plan. She agrees with it. She knows the goal of treatment in her case is cure. She will call with any problems that may develop before her next visit  here.  Chauncey Cruel, MD   09/06/2014 12:02 PM

## 2014-09-07 NOTE — Addendum Note (Signed)
Addended by: Laureen Abrahams on: 09/07/2014 05:52 PM   Modules accepted: Orders

## 2014-09-24 ENCOUNTER — Other Ambulatory Visit: Payer: Self-pay

## 2014-11-26 ENCOUNTER — Ambulatory Visit
Admission: RE | Admit: 2014-11-26 | Discharge: 2014-11-26 | Disposition: A | Discharge status: Home / Self Care | Payer: BC Managed Care – PPO | Source: Ambulatory Visit | Attending: Internal Medicine | Admitting: Internal Medicine

## 2014-11-26 DIAGNOSIS — Z853 Personal history of malignant neoplasm of breast: Secondary | ICD-10-CM

## 2014-11-30 ENCOUNTER — Other Ambulatory Visit: Payer: Self-pay | Admitting: Obstetrics and Gynecology

## 2014-12-01 LAB — CYTOLOGY - PAP

## 2014-12-07 ENCOUNTER — Telehealth: Payer: Self-pay

## 2014-12-07 DIAGNOSIS — E785 Hyperlipidemia, unspecified: Secondary | ICD-10-CM

## 2014-12-07 DIAGNOSIS — I10 Essential (primary) hypertension: Secondary | ICD-10-CM

## 2014-12-07 MED ORDER — CARVEDILOL 6.25 MG PO TABS: 6.2500 mg | ORAL_TABLET | Freq: Two times a day (BID) | ORAL | Status: DC

## 2014-12-07 MED ORDER — ATORVASTATIN CALCIUM 40 MG PO TABS: ORAL_TABLET | ORAL | Status: DC

## 2014-12-07 NOTE — Telephone Encounter (Signed)
Patient needs these two scripts refilled:  carvedilol (COREG) 6.25 MG tablet atorvastatin (LIPITOR) 40 MG tablet   Pharmacy: [id number] - [place] ([place]), [place]  [phone] (H)

## 2014-12-09 NOTE — Telephone Encounter (Signed)
Rxs were sent 12/07/14. Notified pt who was aware, but thanked [place].

## 2014-12-20 ENCOUNTER — Other Ambulatory Visit: Payer: Self-pay | Admitting: Obstetrics and Gynecology

## 2015-01-03 ENCOUNTER — Telehealth: Payer: Self-pay | Admitting: Oncology

## 2015-01-03 NOTE — Telephone Encounter (Signed)
cld & spoke to pt to give r/s appt due to [facility] pt update time & date

## 2015-05-10 ENCOUNTER — Other Ambulatory Visit (HOSPITAL_BASED_OUTPATIENT_CLINIC_OR_DEPARTMENT_OTHER): Payer: BLUE CROSS/BLUE SHIELD

## 2015-05-10 ENCOUNTER — Ambulatory Visit (HOSPITAL_BASED_OUTPATIENT_CLINIC_OR_DEPARTMENT_OTHER): Payer: BLUE CROSS/BLUE SHIELD | Admitting: Oncology

## 2015-05-10 ENCOUNTER — Telehealth: Payer: Self-pay | Admitting: Oncology

## 2015-05-10 VITALS — BP 136/86 | HR 72 | Temp 98.2°F | Resp 18 | Ht 66.0 in | Wt 248.9 lb

## 2015-05-10 DIAGNOSIS — G629 Polyneuropathy, unspecified: Secondary | ICD-10-CM | POA: Diagnosis not present

## 2015-05-10 DIAGNOSIS — C50419 Malignant neoplasm of upper-outer quadrant of unspecified female breast: Secondary | ICD-10-CM

## 2015-05-10 DIAGNOSIS — Z853 Personal history of malignant neoplasm of breast: Secondary | ICD-10-CM

## 2015-05-10 DIAGNOSIS — F411 Generalized anxiety disorder: Secondary | ICD-10-CM

## 2015-05-10 LAB — FERRITIN CHCC: Ferritin: 96 ng/ml (ref 9–269)

## 2015-05-10 LAB — CBC & DIFF AND RETIC
BASO%: 0.8 % (ref 0.0–2.0)
BASOS ABS: 0.1 10*3/uL (ref 0.0–0.1)
EOS%: 2 % (ref 0.0–7.0)
Eosinophils Absolute: 0.2 10*3/uL (ref 0.0–0.5)
HEMATOCRIT: 36.8 % (ref 34.8–46.6)
HEMOGLOBIN: 11.8 g/dL (ref 11.6–15.9)
Immature Retic Fract: 9.1 % (ref 1.60–10.00)
LYMPH#: 3 10*3/uL (ref 0.9–3.3)
LYMPH%: 34.5 % (ref 14.0–49.7)
MCH: 25.6 pg (ref 25.1–34.0)
MCHC: 32.1 g/dL (ref 31.5–36.0)
MCV: 79.8 fL (ref 79.5–101.0)
MONO#: 0.5 10*3/uL (ref 0.1–0.9)
MONO%: 6.1 % (ref 0.0–14.0)
NEUT#: 4.9 10*3/uL (ref 1.5–6.5)
NEUT%: 56.6 % (ref 38.4–76.8)
Platelets: 227 10*3/uL (ref 145–400)
RBC: 4.61 10*6/uL (ref 3.70–5.45)
RDW: 13.8 % (ref 11.2–14.5)
Retic %: 1.49 % (ref 0.70–2.10)
Retic Ct Abs: 68.69 10*3/uL (ref 33.70–90.70)
WBC: 8.6 10*3/uL (ref 3.9–10.3)

## 2015-05-10 LAB — COMPREHENSIVE METABOLIC PANEL (CC13)
ALK PHOS: 127 U/L (ref 40–150)
ALT: 20 U/L (ref 0–55)
ANION GAP: 10 meq/L (ref 3–11)
AST: 16 U/L (ref 5–34)
Albumin: 4.2 g/dL (ref 3.5–5.0)
BILIRUBIN TOTAL: 0.39 mg/dL (ref 0.20–1.20)
BUN: 14.1 mg/dL (ref 7.0–26.0)
CALCIUM: 9.6 mg/dL (ref 8.4–10.4)
CO2: 25 mEq/L (ref 22–29)
Chloride: 106 mEq/L (ref 98–109)
Creatinine: 0.7 mg/dL (ref 0.6–1.1)
GLUCOSE: 101 mg/dL (ref 70–140)
Potassium: 4.4 mEq/L (ref 3.5–5.1)
Sodium: 141 mEq/L (ref 136–145)
Total Protein: 7.6 g/dL (ref 6.4–8.3)

## 2015-05-10 NOTE — Telephone Encounter (Signed)
Patient  req labs same day as visit,avs printed for patient

## 2015-05-10 NOTE — Progress Notes (Signed)
[facility]  Telephone:([phone]) [phone] [phone]     ID: Julia Zuniga DOB: July 01, 1961  MR#: [id number]  MMK#:[id number]  Patient Care Team: Tonye Pearson, MD as PCP - General (Family Medicine) PCP: Tonye Pearson, MD GYN: Richarda Overlie MD SU: Almond Lint MD OTHER MD: Lurline Hare MD  CHIEF COMPLAINT: estrogen receptor negative breast cancer  CURRENT TREATMENT: Observation   BREAST CANCER HISTORY: From doctor calcium tends 12/17/2012 intake note:  Julia Zuniga is a 54 y.o. female. With medical history significant for hyperlipidemia diverticulitis and hiatal hernia. She presented for a screening mammogram in December 2013 that showed an area of abnormality on the right. This measured about 1.6 cm. 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 HER-2/neu was amplified at 3.88. Patient did not have a MRI scan performed. Post biopsy she is doing well. She is without any complaints. Patient case was presented at the multidisciplinary breast conference. She is seen today by Dr. Lurline Hare and Dr. Everardo Beals. Patient does desire breast conservation.  The patient's subsequent history is as detailed below  INTERVAL HISTORY: Golda returns today for followup of her invasive ductal carcinoma. Interval history is generally unremarkable. 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  REVIEW OF SYSTEMS: She was supposed to have started our supplementation after the last visit but she never actually started that. She has problems with peripheral neuropathy, but this is not typical. Usually peripheral neuropathy affects the fingertips and toe tips. She has numbness in the palms of her hands at times. She has more problems in her feet and she has fallen off her treadmill once or twice. Also her joints hurt "all over". This is not constant, but it  is not infrequent. She takes ibuprofen for this may be 4 days out of the week. She is on omeprazole. She has a burning sensation under the left breast at times and it can radiate to her back. She is not sure  What brings this on an it can occur lying or standing or sitting.she never had a rash in that area. It is very inconstant. Sometimes at night she feels her heart beat is irregular. Her breasts feel heavy. A detailed review of systems today was otherwise stable  PAST MEDICAL HISTORY: Past Medical History  Diagnosis Date  . Hyperlipidemia   . Diverticulitis     2012  . Heartburn   . Anxiety   . Hot flashes   . PONV (postoperative nausea and vomiting)   . Breast cancer 12/08/12    Right  . History of radiation therapy 05/13/13-06/18/13    right breast, 5000 cGy 25 sessions  . Anemia   . 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 GERD (gastroesophageal reflux disease)   . Hypertension     PAST SURGICAL HISTORY: Past Surgical History  Procedure Laterality Date  . Cesarean section      x 3  . Tubal ligation    . Tonsillectomy and adenoidectomy    . Cholecystectomy  2001    lap choli  . Breast lumpectomy with sentinel lymph node biopsy  12/31/2012    Procedure: BREAST LUMPECTOMY WITH SENTINEL LYMPH NODE BX;  Surgeon: Almond Lint, MD;  Location: [facility];  Service: General;  Laterality: Right;  . Portacath placement  12/31/2012  Procedure: INSERTION PORT-A-CATH;  Surgeon: Stark Klein, MD;  Location: [place];  Service: General;  Laterality: Left;  Left Subclavian Vein  . Re-excision of breast cancer,superior margins  01/14/2013    Procedure: RE-EXCISION OF BREAST CANCER,SUPERIOR MARGINS;  Surgeon: Stark Klein, MD;  Location: [facility];  Service: General;  Laterality: Right;  right breast re excision for markers   . Port-a-cath removal Left 02/24/2014    Procedure: REMOVAL PORT-A-CATH;  Surgeon: Stark Klein, MD;  Location: [place];  Service: General;  Laterality: Left;    FAMILY HISTORY Family History  Problem Relation Age of Onset  . Stomach cancer Father   . Lung cancer Maternal Uncle   . Skin cancer Maternal Grandmother   . Lung cancer Maternal Grandfather   . Lymphoma Cousin   . Colon cancer Neg Hx   . Esophageal cancer Neg Hx   . Rectal cancer Neg Hx    The patient's father died at the age of 41 from stomach cancer. The patient's mother is alive at age 74. [name] is a single child. There is no history of breast or ovarian cancer in the family to her knowledge.  GYNECOLOGIC HISTORY:  Patient's last menstrual period was 01/07/2009. Menarche age 68, first live birth age 21. She is GX P3. Her periods have stopped before she started chemotherapy. She never took hormone replacement.  SOCIAL HISTORY:  She is a Agricultural engineer. She has a daughter from her first marriage, Julia Zuniga, 53 years old, lives in [place] and works as a Pharmacist, hospital. Her second husband, [name], works as a Dealer. They have 2 daughters,Julia Zuniga, 89 years old, currently attending [employer], and Julia Zuniga, currently 16, at home. The patient attends a [employer].    ADVANCED DIRECTIVES: Not in place   HEALTH MAINTENANCE: History  Substance Use Topics  . Smoking status: Never Smoker   . Smokeless tobacco: Never Used  . Alcohol Use: Yes     Comment: occ     Colonoscopy:  PAP:  Bone density:  Lipid panel:  Allergies  Allergen Reactions  . Chlorhexidine     Patient skin gets bright red with rash.      . Codeine Swelling  . Medralone [Methylprednisolone Acetate]     "coming out of skin"   . Oxycodone-Acetaminophen Other (See Comments)    Face got red and hot    Current Outpatient Prescriptions  Medication Sig Dispense Refill  . atorvastatin (LIPITOR) 40 MG tablet TAKE ONE TABLET BY MOUTH ONCE DAILY IN THE EVENING 90 tablet 1  . B Complex Vitamins (VITAMIN B COMPLEX PO) Take 1 tablet by mouth daily.    .  carvedilol (COREG) 6.25 MG tablet Take 1 tablet (6.25 mg total) by mouth 2 (two) times daily with a meal. 180 tablet 1  . Cholecalciferol (D3-1000) 1000 UNITS capsule Take 1,000 Units by mouth daily.    Marland Kitchen HYDROcodone-acetaminophen (NORCO/VICODIN) 5-325 MG per tablet     . ibuprofen (ADVIL,MOTRIN) 200 MG tablet Take 400 mg by mouth every 6 (six) hours as needed. Pain    . LORazepam (ATIVAN) 0.5 MG tablet Take 1 tablet (0.5 mg total) by mouth 3 (three) times daily as needed for anxiety. 60 tablet 5  . Multiple Vitamins-Minerals (MULTIVITAMIN WITH MINERALS) tablet Take 1 tablet by mouth daily.    Marland Kitchen omeprazole (PRILOSEC) 40 MG capsule Take 1 capsule (40 mg total) by mouth daily. 90 capsule 3  . Probiotic Product (PROBIOTIC DAILY PO) Take 1  tablet by mouth daily.     No current facility-administered medications for this visit.    OBJECTIVE: Middle-aged white woman in no acute distress Filed Vitals:   05/10/15 1151  BP: 136/86  Pulse: 72  Temp: 98.2 F (36.8 C)  Resp: 18     Body mass index is 40.19 kg/(m^2).    ECOG FS:1 - Symptomatic but completely ambulatory  Sclerae unicteric, pupils round and equal Oropharynx clear and moist-- no thrush or other lesions No cervical or supraclavicular adenopathy Lungs no rales or rhonchi Heart regular rate and rhythm Abd soft, nontender, positive bowel sounds MSK no focal spinal tendernessto vigorous palpation and percussion particularly of the upper thoracicspine, no upper extremity lymphedema Neuro: nonfocal, well oriented, appropriate affect Breasts: the right breast is status post lumpectomy and radiation. There is no evidence of local recurrence. The right axilla is benign. The left breast is unremarkable  LAB RESULTS:  CMP     Component Value Date/Time   NA 141 05/10/2015 1132   NA 143 02/24/2014 0802   K 4.4 05/10/2015 1132   K 4.2 02/24/2014 0802   CL 106 02/24/2014 0802   CL 106 06/01/2013 1045   CO2 25 05/10/2015 1132   CO2 29  02/24/2013 1857   GLUCOSE 101 05/10/2015 1132   GLUCOSE 96 02/24/2014 0802   GLUCOSE 94 06/01/2013 1045   BUN 14.1 05/10/2015 1132   BUN 11 02/24/2014 0802   CREATININE 0.7 05/10/2015 1132   CREATININE 0.60 02/24/2014 0802   CALCIUM 9.6 05/10/2015 1132   CALCIUM 9.2 02/24/2013 1857   PROT 7.6 05/10/2015 1132   PROT 6.8 02/24/2013 1857   ALBUMIN 4.2 05/10/2015 1132   ALBUMIN 3.8 02/24/2013 1857   AST 16 05/10/2015 1132   AST 18 02/24/2013 1857   ALT 20 05/10/2015 1132   ALT 30 02/24/2013 1857   ALKPHOS 127 05/10/2015 1132   ALKPHOS 113 02/24/2013 1857   BILITOT 0.39 05/10/2015 1132   BILITOT 0.2* 02/24/2013 1857   GFRNONAA >90 02/24/2013 1857   GFRAA >90 02/24/2013 1857    I No results found for: SPEP  Lab Results  Component Value Date   WBC 8.6 05/10/2015   NEUTROABS 4.9 05/10/2015   HGB 11.8 05/10/2015   HCT 36.8 05/10/2015   MCV 79.8 05/10/2015   PLT 227 05/10/2015      Chemistry      Component Value Date/Time   NA 141 05/10/2015 1132   NA 143 02/24/2014 0802   K 4.4 05/10/2015 1132   K 4.2 02/24/2014 0802   CL 106 02/24/2014 0802   CL 106 06/01/2013 1045   CO2 25 05/10/2015 1132   CO2 29 02/24/2013 1857   BUN 14.1 05/10/2015 1132   BUN 11 02/24/2014 0802   CREATININE 0.7 05/10/2015 1132   CREATININE 0.60 02/24/2014 0802      Component Value Date/Time   CALCIUM 9.6 05/10/2015 1132   CALCIUM 9.2 02/24/2013 1857   ALKPHOS 127 05/10/2015 1132   ALKPHOS 113 02/24/2013 1857   AST 16 05/10/2015 1132   AST 18 02/24/2013 1857   ALT 20 05/10/2015 1132   ALT 30 02/24/2013 1857   BILITOT 0.39 05/10/2015 1132   BILITOT 0.2* 02/24/2013 1857       Lab Results  Component Value Date   LABCA2 38 12/17/2012    No components found for: [id number]  No results for input(s): INR in the last 168 hours.  Urinalysis    Component Value Date/Time  COLORURINE YELLOW 02/24/2013 1856   APPEARANCEUR CLEAR 02/24/2013 1856   LABSPEC 1.008 02/24/2013 1856   PHURINE  6.0 02/24/2013 1856   GLUCOSEU NEGATIVE 02/24/2013 1856   HGBUR LARGE* 02/24/2013 1856   BILIRUBINUR NEGATIVE 02/24/2013 1856   KETONESUR NEGATIVE 02/24/2013 1856   PROTEINUR NEGATIVE 02/24/2013 1856   UROBILINOGEN 0.2 02/24/2013 1856   NITRITE NEGATIVE 02/24/2013 1856   LEUKOCYTESUR NEGATIVE 02/24/2013 1856    STUDIES:  CLINICAL DATA: Malignant lumpectomy of the right breast in July, 2014 with adjuvant chemotherapy and radiation therapy. Patient currently on Herceptin therapy. Annual evaluation.  EXAM: DIGITAL DIAGNOSTIC BILATERAL MAMMOGRAM WITH 3D TOMOSYNTHESIS AND CAD  COMPARISON: 11/24/2013 dating back to 12/08/2012.  ACR Breast Density Category a: The breast tissue is almost entirely fatty.  FINDINGS: CC and MLO views of both breasts with tomosynthesis and a spot tangential view of the lumpectomy site in the right breast were obtained. Post lumpectomy scarring in the upper right breast at the approximate 12 o'clock position, posterior depth, with further retraction of the scar since the examination 1 year ago. Marked improvement in the post radiation trabecular thickening and skin thickening throughout the right breast since 1 year ago. No findings suspicious for malignancy in either breast.  Mammographic images were processed with CAD.  IMPRESSION: No specific mammographic evidence of malignancy. Expected post lumpectomy and post radiation changes in the right breast.  RECOMMENDATION: Bilateral diagnostic mammography in 1 year.  I have discussed the findings and recommendations with the patient. Results were also provided in writing at the conclusion of the visit. If applicable, a reminder letter will be sent to the patient regarding the next appointment.  BI-RADS CATEGORY 2: Benign.   Electronically Signed  By: Evangeline Dakin M.D.  On: 11/26/2014 11:35      ASSESSMENT: 54 y.o. [place] woman  (1) status post right lumpectomy and  sentinel lymph node sampling 12/31/2012 for a pT1c pN0, stage IA invasive ductal carcinoma, grade 3, estrogen and progesterone receptor negative, with an MIB-1 of 83%, and HER-2 amplified, with the signals ratio of 4.36  (2) adjuvant chemotherapy consisted of docetaxel and  carboplatin  given every 21 days x4 of 6 planned cycles, completed 03/30/2013; discontinued because of neuropathy, and given with concurrent  trastuzumab ,continued until 11/16/2013, discontinued because of a drop in  her  ejection fraction, which resolved on repeat 03/01/2014.  (3).  received  radiation therapy from 05/13/2013 through 07/10/201 (4)  (4) other problems include neuropathy, anxiety, a history of arthralgias and myalgias  (5) Iron deficiency anemia diagnosed on the basis of a low MCV, with history of prior normal MCV  PLAN: Dillon Bjork is doing well from a breast cancer point of view, nowover 2 years out from her definitive surgery with no evidence of disease recurrence.  I think the "burning feeling" she sometimes has on the chest wall under her left breast is not going to be due to problems with a disc, since I was pretty vigorous in the exam today. She doesn't have a history suggestive of zoster in that area either. We discussed obtaining plain films of the spine or even a chest CT, but this is so intermittent where simply going to keep an eye on it. If she feels it occurs more frequently or more intensely then we would proceed with a chest CT scan.  I reminded her that she still has small red cells, even though she is not anemic (she her hemoglobin is at the lowest normal as is her  MCV). I suggested she consider our number supplementation and she will think about that.  Otherwise she will see Dr. Matthew Saras again in December after her mammogram, and she will see [place] again in one year. The plan is to continue follow-up for total of 5 years.  Caeli has a good understanding of this plan. She agrees with it. She knows the  goal of treatment in her case is cure. She will call if any problems develop before her next visit here. The patient has a good understanding of the overall plan. She agrees with it. She knows the goal of treatment in her case is cure. She will call with any problems that may develop before her next visit here.  Chauncey Cruel, MD   05/10/2015 12:27 PM

## 2015-05-11 ENCOUNTER — Other Ambulatory Visit: Payer: BC Managed Care – PPO

## 2015-05-11 ENCOUNTER — Ambulatory Visit: Payer: BC Managed Care – PPO | Admitting: Oncology

## 2015-05-12 ENCOUNTER — Other Ambulatory Visit: Payer: Self-pay | Admitting: Obstetrics and Gynecology

## 2015-05-13 LAB — CYTOLOGY - PAP

## 2015-06-07 ENCOUNTER — Other Ambulatory Visit: Payer: Self-pay | Admitting: Physician Assistant

## 2015-07-10 ENCOUNTER — Other Ambulatory Visit: Payer: Self-pay | Admitting: Physician Assistant

## 2015-08-12 ENCOUNTER — Other Ambulatory Visit: Payer: Self-pay | Admitting: Physician Assistant

## 2015-08-17 ENCOUNTER — Encounter: Payer: Self-pay | Admitting: Internal Medicine

## 2015-08-17 ENCOUNTER — Ambulatory Visit (INDEPENDENT_AMBULATORY_CARE_PROVIDER_SITE_OTHER): Payer: BLUE CROSS/BLUE SHIELD | Admitting: Internal Medicine

## 2015-08-17 ENCOUNTER — Other Ambulatory Visit: Payer: Self-pay

## 2015-08-17 VITALS — BP 126/80 | HR 87 | Temp 98.7°F | Resp 16 | Ht 65.25 in | Wt 249.8 lb

## 2015-08-17 DIAGNOSIS — IMO0001 Reserved for inherently not codable concepts without codable children: Secondary | ICD-10-CM

## 2015-08-17 DIAGNOSIS — E7800 Pure hypercholesterolemia, unspecified: Secondary | ICD-10-CM

## 2015-08-17 DIAGNOSIS — K219 Gastro-esophageal reflux disease without esophagitis: Secondary | ICD-10-CM

## 2015-08-17 DIAGNOSIS — Z9889 Other specified postprocedural states: Secondary | ICD-10-CM

## 2015-08-17 DIAGNOSIS — R300 Dysuria: Secondary | ICD-10-CM

## 2015-08-17 DIAGNOSIS — M2550 Pain in unspecified joint: Secondary | ICD-10-CM

## 2015-08-17 DIAGNOSIS — I10 Essential (primary) hypertension: Secondary | ICD-10-CM | POA: Diagnosis not present

## 2015-08-17 LAB — POCT URINALYSIS DIPSTICK
Bilirubin, UA: NEGATIVE
Blood, UA: NEGATIVE
Glucose, UA: NEGATIVE
Ketones, UA: NEGATIVE
LEUKOCYTES UA: NEGATIVE
NITRITE UA: NEGATIVE
PROTEIN UA: NEGATIVE
Spec Grav, UA: <=1.005
Urobilinogen, UA: 0.2
pH, UA: 5.5

## 2015-08-17 LAB — POCT UA - MICROSCOPIC ONLY
BACTERIA, U MICROSCOPIC: NEGATIVE
CRYSTALS, UR, HPF, POC: NEGATIVE
Casts, Ur, LPF, POC: NEGATIVE
EPITHELIAL CELLS, URINE PER MICROSCOPY: NEGATIVE
MUCUS UA: NEGATIVE
RBC, URINE, MICROSCOPIC: NEGATIVE
WBC, Ur, HPF, POC: NEGATIVE
Yeast, UA: NEGATIVE

## 2015-08-17 MED ORDER — OMEPRAZOLE 40 MG PO CPDR: 40.0000 mg | DELAYED_RELEASE_CAPSULE | Freq: Every day | ORAL | Status: DC

## 2015-08-17 MED ORDER — ATORVASTATIN CALCIUM 40 MG PO TABS: ORAL_TABLET | ORAL | Status: DC

## 2015-08-17 MED ORDER — PANTOPRAZOLE SODIUM 20 MG PO TBEC: 40.0000 mg | DELAYED_RELEASE_TABLET | Freq: Every day | ORAL | Status: DC

## 2015-08-17 MED ORDER — CARVEDILOL 6.25 MG PO TABS: ORAL_TABLET | ORAL | Status: DC

## 2015-08-17 NOTE — Progress Notes (Signed)
Subjective:    Patient ID: Julia Zuniga, female    DOB: 1961/08/03, 54 y.o.   MRN: [id number]  HPI Patient presents today for medication refills. She continues to have joint pain and occasional falls/weakness. Last fall 4/16. Has tried water exercise but has pain for several days after. Did not have any improvement of with gabapentin. Can get some relief with ibuprofen 600-800 mg and tylenol. Pain is in joints- behind knees, hands. Has had fewer neuropathy symptoms over time (numbness, tingling, burning), but continues with pain that is very erratic in nature. She notes that pain started around the time she was started on lipitor and her breast cancer treatment so it is hard to know what precipitated it. She does report significant improvement of joint pain with stopping omeprazole. She was unable to tolerate rebound acid reflux even with zantac and tagamet.   Has noticed some irritation with urination. No hematuria, longstanding nocturia x 2-3, occasional stress incontinence. No lower abdominal pain, some chronic low back pain. Chronic constipation, has BM 2x/ week. Uses Miralax occasionally.  Had lipid panel drawn 11/30/14 at gyn office- Total cholesterol- 152, Triglycerides 125, HDL 46, LDL 81  Review of Systems No fever/chills. No hematuria. Dysuria intermittent. No nausea/vomiting/abdominal pain. No falls since 4/16.     Objective:   Physical Exam Physical Exam  Constitutional: Oriented to person, place, and time. She appears well-developed and well-nourished.  HENT:  Head: Normocephalic and atraumatic.  Eyes: Conjunctivae are normal.  Neck: Normal range of motion. Neck supple.  Cardiovascular: Normal rate, regular rhythm and normal heart sounds.   Pulmonary/Chest: Effort normal and breath sounds normal. No CVA tenderness.  Musculoskeletal: Normal range of motion. No swelling or redness.  Neurological: Alert and oriented to person, place, and time.  Skin: Skin is warm and dry.    Psychiatric: Normal mood and affect. Behavior is normal. Judgment and thought content normal.  Vitals reviewed. BP 126/80 mmHg  Pulse 87  Temp(Src) 98.7 F (37.1 C) (Oral)  Resp 16  Ht 5' 5.25" (1.657 m)  Wt 249 lb 12.8 oz (113.309 kg)  BMI 41.27 kg/m2  LMP 01/07/2009 Wt Readings from Last 3 Encounters:  08/17/15 249 lb 12.8 oz (113.309 kg)  05/10/15 248 lb 14.4 oz (112.9 kg)  09/06/14 254 lb 11.2 oz (115.531 kg)   Results for orders placed or performed in visit on 08/17/15  POCT urinalysis dipstick  Result Value Ref Range   Color, UA Yellow    Clarity, UA Clear    Glucose, UA Negative    Bilirubin, UA Negative    Ketones, UA Negative    Spec Grav, UA <=1.005    Blood, UA Negative    pH, UA 5.5    Protein, UA Negative    Urobilinogen, UA 0.2    Nitrite, UA Negative    Leukocytes, UA Negative Negative  POCT UA - Microscopic Only  Result Value Ref Range   WBC, Ur, HPF, POC Negative    RBC, urine, microscopic Negative    Bacteria, U Microscopic Negative    Mucus, UA Negative    Epithelial cells, urine per micros Negative    Crystals, Ur, HPF, POC Negative    Casts, Ur, LPF, POC Negative    Yeast, UA Negative       Assessment & Plan:  1. Reflux - omeprazole (PRILOSEC) 40 MG capsule; Take 1 capsule (40 mg total) by mouth daily.  Dispense: 90 capsule; Refill: 3 - If joint pain not pantoprazole (  PROTONIX) 20 MG tablet; Take 2 tablets (40 mg total) by mouth daily.  Dispense: 30 tablet; Refill: 2  2. Dysuria - POCT urinalysis dipstick- normal - POCT UA - Microscopic Only- normal - this is intermittent in nature and urine normal- encouraged increased hydration  3. Hypercholesterolemia - discussed taking a break from lipitor to see if joint pain improves - atorvastatin (LIPITOR) 40 MG tablet; TAKE ONE TABLET BY MOUTH IN THE EVENING  Dispense: 90 tablet; Refill: 3  4. Essential hypertension - carvedilol (COREG) 6.25 MG tablet; TAKE ONE TABLET BY MOUTH TWICE DAILY WITH  MEALS.  Dispense: 180 tablet; Refill: 0  5. Joint pain - Will do a trial off lipitor, if this doesn't work, will try Protonix instead of omeprazole.  - Recommended Whole 30 diet  - Follow up in 3 months   Clarene Reamer, FNP-BC  [facility]  08/17/2015 3:21 PM I have participated in the care of this patient with the Advanced Practice Provider and agree with Diagnosis and Plan as documented. Robert P. Laney Pastor, M.D.

## 2015-08-17 NOTE — Patient Instructions (Addendum)
Increase your water intake Stop your lipitor for 4 weeks and see if your joint pain improves. If it does not, restart your lipitor and try the Protonix (printed prescription) instead of omeprazole.  Try daily or every other day Miralax to see if your constipation improves.

## 2015-08-23 ENCOUNTER — Other Ambulatory Visit: Payer: Self-pay | Admitting: Obstetrics and Gynecology

## 2015-08-23 DIAGNOSIS — Z9889 Other specified postprocedural states: Secondary | ICD-10-CM

## 2015-08-30 ENCOUNTER — Ambulatory Visit (INDEPENDENT_AMBULATORY_CARE_PROVIDER_SITE_OTHER): Payer: BLUE CROSS/BLUE SHIELD

## 2015-08-30 ENCOUNTER — Encounter: Payer: Self-pay | Admitting: Family Medicine

## 2015-08-30 ENCOUNTER — Telehealth: Payer: Self-pay | Admitting: *Deleted

## 2015-08-30 ENCOUNTER — Ambulatory Visit (INDEPENDENT_AMBULATORY_CARE_PROVIDER_SITE_OTHER): Payer: BLUE CROSS/BLUE SHIELD | Admitting: Emergency Medicine

## 2015-08-30 ENCOUNTER — Telehealth: Payer: Self-pay | Admitting: Oncology

## 2015-08-30 ENCOUNTER — Other Ambulatory Visit: Payer: Self-pay | Admitting: Oncology

## 2015-08-30 ENCOUNTER — Ambulatory Visit (HOSPITAL_COMMUNITY)
Admission: RE | Admit: 2015-08-30 | Discharge: 2015-08-30 | Disposition: A | Discharge status: Home / Self Care | Payer: BLUE CROSS/BLUE SHIELD | Source: Ambulatory Visit | Attending: Family Medicine | Admitting: Family Medicine

## 2015-08-30 VITALS — BP 147/94 | HR 92 | Temp 98.6°F | Resp 16 | Ht 66.0 in | Wt 249.0 lb

## 2015-08-30 DIAGNOSIS — M79604 Pain in right leg: Secondary | ICD-10-CM

## 2015-08-30 DIAGNOSIS — G63 Polyneuropathy in diseases classified elsewhere: Secondary | ICD-10-CM

## 2015-08-30 DIAGNOSIS — C801 Malignant (primary) neoplasm, unspecified: Secondary | ICD-10-CM | POA: Diagnosis not present

## 2015-08-30 MED ORDER — HYDROCODONE-ACETAMINOPHEN 10-325 MG PO TABS
1.0000 | ORAL_TABLET | Freq: Four times a day (QID) | ORAL | Status: DC | PRN
Start: 2015-08-30 — End: 2015-11-21

## 2015-08-30 NOTE — Telephone Encounter (Signed)
Left message to confirm 09/21 appointment

## 2015-08-30 NOTE — Patient Instructions (Signed)
You have an appointment for an ultrasound at 3:45 Register at the [facility] of [place] can use valet parking.

## 2015-08-30 NOTE — Progress Notes (Signed)
Subjective:    Patient ID: Julia Zuniga, female    DOB: 1961/03/09, 54 y.o.   MRN: [id number]  HPI This is a very pleasant 54 yo female who presents with worsening leg pain x 9 days. The pain was so severe this morning that her mother had to drive her. Pain is behind right knee and goes down front of shin. She has a history of post chemo neuropathic pain, but this is worse. Pain with walking, feels weak, has difficulty getting up and down from toilet. Legs feel weak. She tried to walk for a little while x 3 last week and felt very weak. She had some hydrocodone/acetaminophen at home that she took x 1 with some relief. She has been taking 600 mg ibuprofen TID without relief and it is now upsetting her stomach. She has not had any falls. She has not had any numbness/tingling/redness/swelling/warmth. No chest pain or SOB.   Past Medical History  Diagnosis Date  . Hyperlipidemia   . Diverticulitis     2012  . Heartburn   . Anxiety   . Hot flashes   . PONV (postoperative nausea and vomiting)   . Breast cancer 12/08/12    Right  . History of radiation therapy 05/13/13-06/18/13    right breast, 5000 cGy 25 sessions  . Anemia   . 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 GERD (gastroesophageal reflux disease)   . Hypertension    Past Surgical History  Procedure Laterality Date  . Cesarean section      x 3  . Tubal ligation    . Tonsillectomy and adenoidectomy    . Cholecystectomy  2001    lap choli  . Breast lumpectomy with sentinel lymph node biopsy  12/31/2012    Procedure: BREAST LUMPECTOMY WITH SENTINEL LYMPH NODE BX;  Surgeon: Stark Klein, MD;  Location: [place];  Service: General;  Laterality: Right;  . Portacath placement  12/31/2012    Procedure: INSERTION PORT-A-CATH;  Surgeon: Stark Klein, MD;  Location: [place];  Service: General;  Laterality: Left;  Left Subclavian Vein  . Re-excision of breast cancer,superior margins   01/14/2013    Procedure: RE-EXCISION OF BREAST CANCER,SUPERIOR MARGINS;  Surgeon: Stark Klein, MD;  Location: [facility];  Service: General;  Laterality: Right;  right breast re excision for markers   . Port-a-cath removal Left 02/24/2014    Procedure: REMOVAL PORT-A-CATH;  Surgeon: Stark Klein, MD;  Location: [place];  Service: General;  Laterality: Left;   Review of Systems Per HPI    Objective:   Physical Exam  Constitutional: She is oriented to person, place, and time. She appears well-developed and well-nourished. No distress.  HENT:  Head: Normocephalic.  Eyes: Conjunctivae are normal.  Neck: Normal range of motion. Neck supple.  Cardiovascular: Normal rate.   Pulmonary/Chest: Effort normal.  Neurological: She is alert and oriented to person, place, and time.  Skin: Skin is warm and dry. She is not diaphoretic.  Psychiatric: She has a normal mood and affect. Her behavior is normal. Judgment and thought content normal.  Vitals reviewed.  BP 147/94 mmHg  Pulse 92  Temp(Src) 98.6 F (37 C) (Oral)  Resp 16  Ht 5\' 6"  (1.676 m)  Wt 249 lb (112.946 kg)  BMI 40.21 kg/m2  LMP 01/07/2009 Wt Readings from Last 3 Encounters:  08/30/15 249 lb (112.946 kg)  08/17/15 249 lb 12.8 oz (113.309 kg)  05/10/15 248 lb 14.4 oz (112.9 kg)  [facility] reading (PRIMARY) by  Dr. Everlene Farrier right knee complete- no abnormalities    Assessment & Plan:  1. Right leg pain - DG Knee Complete 4 Views Right; Future - HYDROcodone-acetaminophen (NORCO) 10-325 MG per tablet; Take 1 tablet by mouth every 6 (six) hours as needed.  Dispense: 20 tablet; Refill: 0 - VAS [place] LOWER EXTREMITY VENOUS (DVT); Future - Ambulatory referral to Neurology  2. Neuropathy associated with cancer - Ambulatory referral to Neurology  - received call from vascular lab that study was negative for DVT or Baker's cyst - called patient and left her a message with my contact info if she has any questions   Clarene Reamer,  FNP-BC  [facility]  08/30/2015 4:43 PM

## 2015-08-30 NOTE — Telephone Encounter (Signed)
Call transferred from scheduler Amber to Triage.   "I was seen in May and discussed leg pain with Dr. Jana Hakim.  I was told to call if pain persist.  I am in excruciating pain and I hope he remembers telling me to call to F/U on bone/muscle pain.  Ibuprofen and tylenol are no longer helping.  Lower body strength is worse.  I can't get off toilet, chair and can hardly walk on my legs.  I have neuropathy but this is different.  Pain moves from rt ankle to shin to knee a week ago that moves up hip.  I am on my way to an appointment with Dr. Carlean Purl so call [phone] and leave a message."  Next scheduled f/u 05-11-2015 with Dr. Jana Hakim.

## 2015-08-30 NOTE — Telephone Encounter (Signed)
Called [phone].  Message left for patient to go to [facility] for Xray of right hip followed by visit at [facility] tomorrow. Asked for return call before 1630.  P.O.F generated and can adjust when return call received.  Radiology confirmed right hip xray can be a walk-in procedure.

## 2015-08-30 NOTE — Telephone Encounter (Signed)
Suggest R hip film (written) to be done tomorrow and to see Retta Mac after that for eval

## 2015-08-30 NOTE — Progress Notes (Signed)
*  PRELIMINARY RESULTS* Vascular Ultrasound Right lower extremity venous duplex has been completed.  Preliminary findings: negative for DVT and baker's cyst.   Called results to Dr. Carlean Purl.   Landry Mellow, RDMS, RVT  08/30/2015, 4:27 PM

## 2015-08-31 ENCOUNTER — Ambulatory Visit (HOSPITAL_BASED_OUTPATIENT_CLINIC_OR_DEPARTMENT_OTHER): Payer: BLUE CROSS/BLUE SHIELD

## 2015-08-31 ENCOUNTER — Ambulatory Visit (HOSPITAL_COMMUNITY)
Admission: RE | Admit: 2015-08-31 | Discharge: 2015-08-31 | Disposition: A | Discharge status: Home / Self Care | Payer: BLUE CROSS/BLUE SHIELD | Source: Ambulatory Visit | Attending: Nurse Practitioner | Admitting: Nurse Practitioner

## 2015-08-31 ENCOUNTER — Other Ambulatory Visit (HOSPITAL_COMMUNITY): Payer: Self-pay | Admitting: Nurse Practitioner

## 2015-08-31 ENCOUNTER — Other Ambulatory Visit: Payer: Self-pay | Admitting: Nurse Practitioner

## 2015-08-31 ENCOUNTER — Encounter: Payer: Self-pay | Admitting: Nurse Practitioner

## 2015-08-31 ENCOUNTER — Other Ambulatory Visit: Payer: Self-pay | Admitting: Oncology

## 2015-08-31 ENCOUNTER — Other Ambulatory Visit: Payer: Self-pay

## 2015-08-31 ENCOUNTER — Ambulatory Visit (HOSPITAL_BASED_OUTPATIENT_CLINIC_OR_DEPARTMENT_OTHER): Payer: BLUE CROSS/BLUE SHIELD | Admitting: Nurse Practitioner

## 2015-08-31 ENCOUNTER — Ambulatory Visit (HOSPITAL_COMMUNITY): Admission: RE | Admit: 2015-08-31 | Payer: BLUE CROSS/BLUE SHIELD | Source: Ambulatory Visit

## 2015-08-31 VITALS — BP 144/83 | HR 80 | Temp 99.1°F | Resp 17 | Ht 66.0 in | Wt 249.8 lb

## 2015-08-31 DIAGNOSIS — C50419 Malignant neoplasm of upper-outer quadrant of unspecified female breast: Secondary | ICD-10-CM

## 2015-08-31 DIAGNOSIS — R531 Weakness: Secondary | ICD-10-CM

## 2015-08-31 DIAGNOSIS — R5383 Other fatigue: Secondary | ICD-10-CM | POA: Insufficient documentation

## 2015-08-31 LAB — CBC WITH DIFFERENTIAL/PLATELET
BASO%: 1.2 % (ref 0.0–2.0)
BASOS ABS: 0.1 10*3/uL (ref 0.0–0.1)
EOS ABS: 0.2 10*3/uL (ref 0.0–0.5)
EOS%: 1.9 % (ref 0.0–7.0)
HEMATOCRIT: 39.6 % (ref 34.8–46.6)
HEMOGLOBIN: 12.6 g/dL (ref 11.6–15.9)
LYMPH#: 3 10*3/uL (ref 0.9–3.3)
LYMPH%: 32.2 % (ref 14.0–49.7)
MCH: 25.1 pg (ref 25.1–34.0)
MCHC: 31.9 g/dL (ref 31.5–36.0)
MCV: 78.6 fL — AB (ref 79.5–101.0)
MONO#: 0.5 10*3/uL (ref 0.1–0.9)
MONO%: 5.7 % (ref 0.0–14.0)
NEUT%: 59 % (ref 38.4–76.8)
NEUTROS ABS: 5.5 10*3/uL (ref 1.5–6.5)
Platelets: 246 10*3/uL (ref 145–400)
RBC: 5.04 10*6/uL (ref 3.70–5.45)
RDW: 14 % (ref 11.2–14.5)
WBC: 9.3 10*3/uL (ref 3.9–10.3)

## 2015-08-31 LAB — COMPREHENSIVE METABOLIC PANEL (CC13)
ALBUMIN: 4.5 g/dL (ref 3.5–5.0)
ALK PHOS: 119 U/L (ref 40–150)
ALT: 27 U/L (ref 0–55)
AST: 18 U/L (ref 5–34)
Anion Gap: 12 mEq/L — ABNORMAL HIGH (ref 3–11)
BILIRUBIN TOTAL: 0.32 mg/dL (ref 0.20–1.20)
BUN: 12.5 mg/dL (ref 7.0–26.0)
CALCIUM: 10.1 mg/dL (ref 8.4–10.4)
CO2: 26 mEq/L (ref 22–29)
CREATININE: 0.7 mg/dL (ref 0.6–1.1)
Chloride: 104 mEq/L (ref 98–109)
EGFR: >90 mL/min/{1.73_m2} (ref >90)
GLUCOSE: 97 mg/dL (ref 70–140)
Potassium: 4.3 mEq/L (ref 3.5–5.1)
SODIUM: 142 meq/L (ref 136–145)
TOTAL PROTEIN: 8.1 g/dL (ref 6.4–8.3)

## 2015-08-31 MED ORDER — GADOBENATE DIMEGLUMINE 529 MG/ML IV SOLN
20.0000 mL | Freq: Once | INTRAVENOUS | Status: AC | PRN
  Administered 2015-08-31: 20 mL via INTRAVENOUS

## 2015-08-31 NOTE — Progress Notes (Signed)
SYMPTOM MANAGEMENT CLINIC   HPI: Julia Zuniga 54 y.o. female diagnosed with breast cancer.  Patient is status post lumpectomy, radiation therapy, and last received chemotherapy in December 2014.  Currently undergoing observation only.   Patient reports progressive, generalized weakness to her bilateral legs; with a specific pain to her right posterior knee that radiates down her right shin.  She has also experienced intermittent pain to her left upper thigh region.  She states that it has become very difficult at times to walk; and states that she had to have her mother drive her to her doctor's appointment just just today.  She reports difficulty lifting herself up out of a chair or off of the toilet at times.  She also reports some chronic, mild low back pain for the past several months.  She denies any recent known injury or trauma.  She also denies any other new symptoms whatsoever.  She denies any recent fevers or chills.  Patient saw Clarene Reamer, nurse practitioner at her primary care provider's office just yesterday for evaluation of the same complaints.  She obtained a right knee plain film x-ray; which was essentially normal.  She also underwent a right leg Doppler ultrasound which was negative for DVT.  HPI  ROS  Past Medical History  Diagnosis Date  . Hyperlipidemia   . Diverticulitis     2012  . Heartburn   . Anxiety   . Hot flashes   . PONV (postoperative nausea and vomiting)   . Breast cancer 12/08/12    Right  . History of radiation therapy 05/13/13-06/18/13    right breast, 5000 cGy 25 sessions  . Anemia   . 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 GERD (gastroesophageal reflux disease)   . Hypertension     Past Surgical History  Procedure Laterality Date  . Cesarean section      x 3  . Tubal ligation    . Tonsillectomy and adenoidectomy    . Cholecystectomy  2001    lap choli  . Breast lumpectomy with sentinel lymph node biopsy   12/31/2012    Procedure: BREAST LUMPECTOMY WITH SENTINEL LYMPH NODE BX;  Surgeon: Stark Klein, MD;  Location: [facility];  Service: General;  Laterality: Right;  . Portacath placement  12/31/2012    Procedure: INSERTION PORT-A-CATH;  Surgeon: Stark Klein, MD;  Location: [facility];  Service: General;  Laterality: Left;  Left Subclavian Vein  . Re-excision of breast cancer,superior margins  01/14/2013    Procedure: RE-EXCISION OF BREAST CANCER,SUPERIOR MARGINS;  Surgeon: Stark Klein, MD;  Location: [facility];  Service: General;  Laterality: Right;  right breast re excision for markers   . 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    is allergic to chlorhexidine; codeine; medralone; and oxycodone-acetaminophen.    Medication List       This list is accurate as of: 08/31/15  5:08 PM.  Always use your most recent med list.               carvedilol 6.25 MG tablet  Commonly known as:  COREG  TAKE ONE TABLET BY MOUTH TWICE  DAILY WITH MEALS.     D3-1000 1000 UNITS capsule  Generic drug:  Cholecalciferol  Take 1,000 Units by mouth daily.     HYDROcodone-acetaminophen 10-325 MG per tablet  Commonly known as:  NORCO  Take 1 tablet by mouth every 6 (six) hours as needed.     ibuprofen 200 MG tablet  Commonly known as:  ADVIL,MOTRIN  Take 400 mg by mouth every 6 (six) hours as needed. Pain     LORazepam 0.5 MG tablet  Commonly known as:  ATIVAN  Take 1 tablet (0.5 mg total) by mouth 3 (three) times daily as needed for anxiety.     multivitamin with minerals tablet  Take 1 tablet by mouth daily.     omeprazole 40 MG capsule    Commonly known as:  PRILOSEC  Take 1 capsule (40 mg total) by mouth daily.     pantoprazole 20 MG tablet  Commonly known as:  PROTONIX  Take 2 tablets (40 mg total) by mouth daily.     PROBIOTIC DAILY PO  Take 1 tablet by mouth daily.     VITAMIN B COMPLEX PO  Take 1 tablet by mouth daily.         PHYSICAL EXAMINATION  Oncology Vitals 08/31/2015 08/30/2015 08/17/2015 05/10/2015 09/06/2014 08/11/2014 05/29/2014  Height 168 cm 168 cm 166 cm 168 cm 168 cm 168 cm 166 cm  Weight 113.309 kg 112.946 kg 113.309 kg 112.9 kg 115.531 kg 112.492 kg 111.131 kg  Weight (lbs) 249 lbs 13 oz 249 lbs 249 lbs 13 oz 248 lbs 14 oz 254 lbs 11 oz 248 lbs 245 lbs  BMI (kg/m2) 40.32 kg/m2 40.19 kg/m2 41.25 kg/m2 40.17 kg/m2 41.11 kg/m2 40.03 kg/m2 40.15 kg/m2  Temp 99.1 98.6 98.7 98.2 98.9 98.4 98.7  Pulse 80 92 87 72 88 93 94  Resp $Rem'17 16 16 18 18 16 'yKBl$ -  SpO2 97 - - - - 96 99  BSA (m2) 2.3 m2 2.29 m2 2.28 m2 2.29 m2 2.32 m2 2.29 m2 2.27 m2   BP Readings from Last 3 Encounters:  08/31/15 144/83  08/30/15 147/94  08/17/15 126/80    Physical Exam  Constitutional: She is oriented to person, place, and time and well-developed, well-nourished, and in no distress.  HENT:  Head: Normocephalic and atraumatic.  Mouth/Throat: Oropharynx is clear and moist.  Eyes: Conjunctivae and EOM are normal. Pupils are equal, round, and reactive to light. Right eye exhibits no discharge. Left eye exhibits no discharge. No scleral icterus.  Neck: Normal range of motion. Neck supple.  Pulmonary/Chest: Effort normal. No respiratory distress.  Musculoskeletal: Normal range of motion. She exhibits no edema or tenderness.  Neurological: She is alert and oriented to person, place, and time.  On exam today.  Patient has no specific tenderness to either lower extremity with palpation; but patient did find it difficult to ambulate without a limp.  She also has some mild decreased weakness to her bilateral extremities; with slight  increased weakness to her right lower extremity.  She denies any obvious numbness or tingling.       Skin: Skin is warm and dry. No rash noted. No erythema. No pallor.  Psychiatric:  Slightly anxious and occasionally tearful.  Nursing note and vitals reviewed.   LABORATORY DATA:. Appointment on 08/31/2015  Component Date Value Ref Range Status  . WBC 08/31/2015 9.3  3.9 - 10.3 10e3/uL Final  . NEUT# 08/31/2015 5.5  1.5 - 6.5 10e3/uL Final  . HGB 08/31/2015 12.6  11.6 - 15.9 g/dL Final  . HCT 08/31/2015 39.6  34.8 - 46.6 % Final  . Platelets 08/31/2015 246  145 - 400 10e3/uL Final  . MCV 08/31/2015 78.6* 79.5 - 101.0 fL Final  . MCH 08/31/2015 25.1  25.1 - 34.0 pg Final  . MCHC 08/31/2015 31.9  31.5 - 36.0 g/dL Final  . RBC 08/31/2015 5.04  3.70 - 5.45 10e6/uL Final  . RDW 08/31/2015 14.0  11.2 - 14.5 % Final  . lymph# 08/31/2015 3.0  0.9 - 3.3 10e3/uL Final  . MONO# 08/31/2015 0.5  0.1 - 0.9 10e3/uL Final  . Eosinophils Absolute 08/31/2015 0.2  0.0 - 0.5 10e3/uL Final  . Basophils Absolute 08/31/2015 0.1  0.0 - 0.1 10e3/uL Final  . NEUT% 08/31/2015 59.0  38.4 - 76.8 % Final  . LYMPH% 08/31/2015 32.2  14.0 - 49.7 % Final  . MONO% 08/31/2015 5.7  0.0 - 14.0 % Final  . EOS% 08/31/2015 1.9  0.0 - 7.0 % Final  . BASO% 08/31/2015 1.2  0.0 - 2.0 % Final  . Sodium 08/31/2015 142  136 - 145 mEq/L Final  . Potassium 08/31/2015 4.3  3.5 - 5.1 mEq/L Final  . Chloride 08/31/2015 104  98 - 109 mEq/L Final  . CO2 08/31/2015 26  22 - 29 mEq/L Final  . Glucose 08/31/2015 97  70 - 140 mg/dl Final   Glucose reference range is for nonfasting patients. Fasting glucose reference range is 70- 100.  Marland Kitchen BUN 08/31/2015 12.5  7.0 - 26.0 mg/dL Final  . Creatinine 08/31/2015 0.7  0.6 - 1.1 mg/dL Final  . Total Bilirubin 08/31/2015 0.32  0.20 - 1.20 mg/dL Final  . Alkaline Phosphatase 08/31/2015 119  40 - 150 U/L Final  . AST 08/31/2015 18  5 - 34 U/L Final  . ALT 08/31/2015 27  0 - 55 U/L Final  .  Total Protein 08/31/2015 8.1  6.4 - 8.3 g/dL Final  . Albumin 08/31/2015 4.5  3.5 - 5.0 g/dL Final  . Calcium 08/31/2015 10.1  8.4 - 10.4 mg/dL Final  . Anion Gap 08/31/2015 12* 3 - 11 mEq/L Final  . EGFR 08/31/2015 >90  >90 ml/min/1.73 m2 Final   eGFR is calculated using the CKD-EPI Creatinine Equation (2009)     RADIOGRAPHIC STUDIES: Dg Knee Complete 4 Views Right  08/30/2015   CLINICAL DATA:  Worsening right leg pain for 9 days.  EXAM: RIGHT KNEE - COMPLETE 4+ VIEW  COMPARISON:  None.  FINDINGS: There is no evidence of fracture, dislocation, or joint effusion. There mild decreased femoral tibial joint space. Soft tissues are unremarkable.  IMPRESSION: No acute fracture or dislocation.   Electronically Signed   By: Abelardo Diesel M.D.   On: 08/30/2015 12:30    ASSESSMENT/PLAN:    Weakness Patient reports progressive, generalized weakness to her bilateral legs; with a specific pain to her right posterior knee that radiates down her right shin.  She has also experienced intermittent pain to her left upper thigh region.  She states that it has become very difficult at times to walk; and states that she had to have her mother drive her to her doctor's appointment just just today.  She reports difficulty lifting herself up out of a chair or off of the toilet at times.  She also reports some chronic, mild low back pain for the past several months.  She denies any recent known injury or trauma.  She also denies any other new symptoms whatsoever.  She denies any recent fevers or chills.  Patient saw Clarene Reamer, nurse practitioner at her primary care provider's office just yesterday for evaluation of the same complaints.  She obtained a right knee plain film x-ray; which was essentially normal.  She also underwent a right leg Doppler ultrasound which was negative for DVT.  On exam today.  Patient has no specific tenderness to either lower extremity with palpation; but patient did find it difficult  to ambulate without a limp.  She also has some mild decreased weakness to her bilateral extremities; with slight increased weakness to her right lower extremity.  She denies any obvious numbness or tingling.  Patient states that she does have a history of neuropathy since she last received chemotherapy in December 2014; but feels that the symptoms are different and have considerably worsened within the past month or so.  After reviewing all findings with Dr. Rolm Bookbinder was made to obtain both a thoracic and lumbar MRI with contrast for further evaluation.  MRI staff called this provider; requesting that patient have peripheral IV started follow the cancer Center today.  Patient is scheduled for a thoracic and lumbar MRI later this evening.  Advised patient to go directly to the emergency department in the meantime if she develops any worsening symptoms whatsoever.    Cancer of upper-outer quadrant of female breast, cT1N0, -/-/+, Ki 67 80% Patient underwent a right breast lumpectomy in January 2014, completed radiation therapy and July 2014, received her last Taxotere/carboplatin/Herceptin chemotherapy in December 2014, and underwent a reexcision of her right breast cancer in March 2015.  Since that time.-Patient has been undergoing observation only.  Patient is scheduled for a mammogram on the super 20th 2016.  Patient is scheduled for labs and follow-up visit on 05/10/2016.  Patient stated understanding of all instructions; and was in agreement with this plan of care. The patient knows to call the clinic with any problems, questions or concerns.   Review/collaboration with Dr. Jana Hakim regarding all aspects of patient's visit today.   Total time spent with patient was 40 minutes;  with greater than 75 percent of that time spent in face to face counseling regarding patient's symptoms,  and coordination of care and follow up.  Disclaimer:This dictation was prepared with Dragon/digital  dictation along with [employer]. Any transcriptional errors that result from this process are unintentional.  Drue Second, NP 08/31/2015

## 2015-08-31 NOTE — Assessment & Plan Note (Signed)
Patient underwent a right breast lumpectomy in January 2014, completed radiation therapy and July 2014, received her last Taxotere/carboplatin/Herceptin chemotherapy in December 2014, and underwent a reexcision of her right breast cancer in March 2015.  Since that time.-Patient has been undergoing observation only.  Patient is scheduled for a mammogram on the super 20th 2016.  Patient is scheduled for labs and follow-up visit on 05/10/2016.

## 2015-08-31 NOTE — Assessment & Plan Note (Signed)
Patient reports progressive, generalized weakness to her bilateral legs; with a specific pain to her right posterior knee that radiates down her right shin.  She has also experienced intermittent pain to her left upper thigh region.  She states that it has become very difficult at times to walk; and states that she had to have her mother drive her to her doctor's appointment just just today.  She reports difficulty lifting herself up out of a chair or off of the toilet at times.  She also reports some chronic, mild low back pain for the past several months.  She denies any recent known injury or trauma.  She also denies any other new symptoms whatsoever.  She denies any recent fevers or chills.  Patient saw Clarene Reamer, nurse practitioner at her primary care provider's office just yesterday for evaluation of the same complaints.  She obtained a right knee plain film x-ray; which was essentially normal.  She also underwent a right leg Doppler ultrasound which was negative for DVT.  On exam today.  Patient has no specific tenderness to either lower extremity with palpation; but patient did find it difficult to ambulate without a limp.  She also has some mild decreased weakness to her bilateral extremities; with slight increased weakness to her right lower extremity.  She denies any obvious numbness or tingling.  Patient states that she does have a history of neuropathy since she last received chemotherapy in December 2014; but feels that the symptoms are different and have considerably worsened within the past month or so.  After reviewing all findings with Dr. Rolm Bookbinder was made to obtain both a thoracic and lumbar MRI with contrast for further evaluation.  MRI staff called this provider; requesting that patient have peripheral IV started follow the cancer Center today.  Patient is scheduled for a thoracic and lumbar MRI later this evening.  Advised patient to go directly to the emergency  department in the meantime if she develops any worsening symptoms whatsoever.

## 2015-09-01 ENCOUNTER — Other Ambulatory Visit: Payer: Self-pay | Admitting: Nurse Practitioner

## 2015-09-01 ENCOUNTER — Telehealth: Payer: Self-pay | Admitting: Nurse Practitioner

## 2015-09-01 ENCOUNTER — Ambulatory Visit (HOSPITAL_COMMUNITY)
Admission: RE | Admit: 2015-09-01 | Discharge: 2015-09-01 | Disposition: A | Discharge status: Home / Self Care | Payer: BLUE CROSS/BLUE SHIELD | Source: Ambulatory Visit | Attending: Nurse Practitioner | Admitting: Nurse Practitioner

## 2015-09-01 DIAGNOSIS — R29898 Other symptoms and signs involving the musculoskeletal system: Secondary | ICD-10-CM

## 2015-09-01 DIAGNOSIS — M47896 Other spondylosis, lumbar region: Secondary | ICD-10-CM | POA: Diagnosis not present

## 2015-09-01 DIAGNOSIS — M47894 Other spondylosis, thoracic region: Secondary | ICD-10-CM | POA: Insufficient documentation

## 2015-09-01 MED ORDER — GADOBENATE DIMEGLUMINE 529 MG/ML IV SOLN
20.0000 mL | Freq: Once | INTRAVENOUS | Status: AC
  Administered 2015-09-01: 20 mL via INTRAVENOUS

## 2015-09-01 NOTE — Telephone Encounter (Signed)
Unable to obtain the MRI results from scan last nite. Spoke with Dr. Sherren Mocha radiologist; and his verbal report was that there was no metastasis, no cord compression; but possibly some degenerative issues only.   Awaiting written report at present; but called pt to relay all of this info to her.   Advised pt RN would call to fully review all again; once written report available.   Pt was instucted to keep the Neuro referral she has already scheduled; and to also follow up with her PCP.   She was advised to go directly to the ED for any worsening symptoms whatsoever. Pt stated understanding of all instructions.

## 2015-09-02 ENCOUNTER — Other Ambulatory Visit (INDEPENDENT_AMBULATORY_CARE_PROVIDER_SITE_OTHER): Payer: BLUE CROSS/BLUE SHIELD

## 2015-09-02 ENCOUNTER — Encounter: Payer: Self-pay | Admitting: Family Medicine

## 2015-09-02 ENCOUNTER — Ambulatory Visit (INDEPENDENT_AMBULATORY_CARE_PROVIDER_SITE_OTHER): Payer: BLUE CROSS/BLUE SHIELD | Admitting: Neurology

## 2015-09-02 ENCOUNTER — Encounter: Payer: Self-pay | Admitting: Neurology

## 2015-09-02 VITALS — BP 120/84 | HR 96 | Ht 66.0 in | Wt 245.2 lb

## 2015-09-02 DIAGNOSIS — G620 Drug-induced polyneuropathy: Secondary | ICD-10-CM

## 2015-09-02 DIAGNOSIS — R52 Pain, unspecified: Secondary | ICD-10-CM | POA: Diagnosis not present

## 2015-09-02 DIAGNOSIS — T451X5A Adverse effect of antineoplastic and immunosuppressive drugs, initial encounter: Secondary | ICD-10-CM | POA: Diagnosis not present

## 2015-09-02 DIAGNOSIS — M791 Myalgia, unspecified site: Secondary | ICD-10-CM

## 2015-09-02 DIAGNOSIS — R531 Weakness: Secondary | ICD-10-CM | POA: Diagnosis not present

## 2015-09-02 LAB — VITAMIN B12: Vitamin B-12: 1028 pg/mL — ABNORMAL HIGH (ref 211–911)

## 2015-09-02 LAB — VITAMIN D 25 HYDROXY (VIT D DEFICIENCY, FRACTURES): VITD: 32.12 ng/mL (ref 30.00–100.00)

## 2015-09-02 LAB — TSH: TSH: 1.23 u[IU]/mL (ref 0.35–4.50)

## 2015-09-02 NOTE — Progress Notes (Signed)
[facility] Clinic Note - Initial Visit   Date: 09/02/2015  Julia Zuniga MRN: [id number] DOB: 03/12/1961   Dear Dr. Laney Pastor:  Thank you for your kind referral of Julia Zuniga for consultation of bilateral leg weakness and pain. Although her history is well known to you, please allow [place] to reiterate it for the purpose of our medical record. The patient was accompanied to the clinic by self.    History of Present Illness: Julia Zuniga is a 54 y.o. right-handed Caucasian female with hyperlipidemia, GERD, hypertension, and right breast cancer s/p chemotherapy and radiation complicated by neuropathy (2014) presenting for evaluation of generalized weakness and pain.    She was diagnosed with breast cancer in December 2013 and started chemotherapy with docetaxel and carboplatin.  Six months into her chemotherapy, she developed neuropathy so stopped this regimen and given trastuzamab.  She was taking neurontin 900mg  TID but has no benefit, so stopped it.  Since this time, she has developed a number of other issues including generalized weakness, polyarthralgia, myalgias, and generalized pain.    She has burning at soles of the feet and numbness which involves her feet and lower legs.  It also involves her hands and left forearm and upper arm. Symptoms are constant and worse when she is laying and improved if she is standing.  She had a lot of pain in her proximal legs and recently has severe right knee pain.  No evidence of right leg DVT or boney abnormality of the knee.  This spontaneously improved over the past two days.  Around early September, she started having weakness of her legs and had trouble raising from the commode.  When she was seen at her oncology follow-up, she was unable to lift her legs because of pain.  She underwent MRI thoracic and lumbar spine which was relatively unremarkably, possible right L5 nerve root encroachment and mild degenerative  changes.  She has not done any physical therapy.  Out-side paper records, electronic medical record, and images have been reviewed where available and summarized as:  MRI thoracic and lumbar spine 09/01/2015: Negative for metastatic disease or cord compression. No finding to explain the patient's symptoms. Mild multilevel mid and upper thoracic spondylosis without central canal or foraminal narrowing. Overall mild lumbar spondylosis most notable at L4-5 where a disc protrusion results in some encroachment on the descending right L5 root.  MRI brain wwo contrast 02/25/2015:   No acute or metastatic intracranial abnormality. Negative MRI appearance of the brain.   Past Medical History  Diagnosis Date  . Hyperlipidemia   . Diverticulitis     2012  . Heartburn   . Anxiety   . Hot flashes   . PONV (postoperative nausea and vomiting)   . Breast cancer 12/08/12    Right  . History of radiation therapy 05/13/13-06/18/13    right breast, 5000 cGy 25 sessions  . Anemia   . 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 GERD (gastroesophageal reflux disease)   . Hypertension     Past Surgical History  Procedure Laterality Date  . Cesarean section      x 3  . Tubal ligation    . Tonsillectomy and adenoidectomy    . Cholecystectomy  2001    lap choli  . Breast lumpectomy with sentinel lymph node biopsy  12/31/2012    Procedure: BREAST LUMPECTOMY WITH SENTINEL LYMPH NODE BX;  Surgeon: Stark Klein, MD;  Location: [facility];  Service: General;  Laterality: Right;  . Portacath placement  12/31/2012    Procedure: INSERTION PORT-A-CATH;  Surgeon: Stark Klein, MD;  Location: [facility];  Service: General;  Laterality: Left;  Left Subclavian Vein  . Re-excision of breast cancer,superior margins  01/14/2013    Procedure: RE-EXCISION OF BREAST CANCER,SUPERIOR MARGINS;  Surgeon: Stark Klein, MD;  Location: [facility];  Service: General;  Laterality: Right;   right breast re excision for markers   . Port-a-cath removal Left 02/24/2014    Procedure: REMOVAL PORT-A-CATH;  Surgeon: Stark Klein, MD;  Location: [facility];  Service: General;  Laterality: Left;     Medications:  Outpatient Encounter Prescriptions as of 09/02/2015  Medication Sig Note  . B Complex Vitamins (VITAMIN B COMPLEX PO) Take 1 tablet by mouth daily.   . carvedilol (COREG) 6.25 MG tablet TAKE ONE TABLET BY MOUTH TWICE DAILY WITH MEALS.   Marland Kitchen Cholecalciferol (D3-1000) 1000 UNITS capsule Take 1,000 Units by mouth daily.   Marland Kitchen HYDROcodone-acetaminophen (NORCO) 10-325 MG per tablet Take 1 tablet by mouth every 6 (six) hours as needed.   Marland Kitchen ibuprofen (ADVIL,MOTRIN) 200 MG tablet Take 400 mg by mouth every 6 (six) hours as needed. Pain   . LORazepam (ATIVAN) 0.5 MG tablet Take 1 tablet (0.5 mg total) by mouth 3 (three) times daily as needed for anxiety.   . Multiple Vitamins-Minerals (MULTIVITAMIN WITH MINERALS) tablet Take 1 tablet by mouth daily.   Marland Kitchen omeprazole (PRILOSEC) 40 MG capsule Take 1 capsule (40 mg total) by mouth daily.   . pantoprazole (PROTONIX) 20 MG tablet Take 2 tablets (40 mg total) by mouth daily.   . Probiotic Product (PROBIOTIC DAILY PO) Take 1 tablet by mouth daily.   . [DISCONTINUED] atorvastatin (LIPITOR) 40 MG tablet  09/02/2015: Received from: External Pharmacy   No facility-administered encounter medications on file as of 09/02/2015.     Allergies:  Allergies  Allergen Reactions  . Chlorhexidine     Patient skin gets bright red with rash.      . Codeine Swelling  . Medralone [Methylprednisolone Acetate]     "coming out of skin"   . Oxycodone-Acetaminophen Other (See Comments)    Face got red and hot    Family History: Family History  Problem Relation Age of Onset  . Stomach cancer Father   . Lung cancer Maternal Uncle   . Skin cancer Maternal Grandmother   . Lung cancer Maternal Grandfather   . Lymphoma Cousin   . Colon cancer Neg  Hx   . Esophageal cancer Neg Hx   . Rectal cancer Neg Hx     Social History: Social History  Substance Use Topics  . Smoking status: Never Smoker   . Smokeless tobacco: Never Used  . Alcohol Use: 0.0 oz/week    0 Standard drinks or equivalent per week     Comment: occ   Social History   Social History Narrative    Review of Systems:  CONSTITUTIONAL: No fevers, chills, night sweats, or weight loss.   EYES: No visual changes or eye pain ENT: No hearing changes.  No history of nose bleeds.   RESPIRATORY: No cough, wheezing and shortness of breath.   CARDIOVASCULAR: Negative for chest pain, and palpitations.   GI: Negative for abdominal discomfort, blood in stools or black stools.  No recent change in bowel habits.   GU:  No history of incontinence.   MUSCLOSKELETAL: No history of  joint pain or swelling.  +myalgias.   SKIN: Negative for lesions, rash, and itching.   HEMATOLOGY/ONCOLOGY: Negative for prolonged bleeding, bruising easily, and swollen nodes.  +history of cancer.   ENDOCRINE: Negative for cold or heat intolerance, polydipsia or goiter.   PSYCH:  No depression + anxiety symptoms.   NEURO: As Above.   Vital Signs:  BP 120/84 mmHg  Pulse 96  Ht 5\' 6"  (1.676 m)  Wt 245 lb 4 oz (111.245 kg)  BMI 39.60 kg/m2  SpO2 96%  LMP 01/07/2009   General Medical Exam:   General:  Obese appearing, comfortable.   Eyes/ENT: see cranial nerve examination.   Neck: No masses appreciated.  Full range of motion without tenderness.  No carotid bruits. Respiratory:  Clear to auscultation, good air entry bilaterally.   Cardiac:  Regular rate and rhythm, no murmur.   Extremities:  No deformities, edema, or skin discoloration.  Skin:  No rashes or lesions.  Neurological Exam: MENTAL STATUS including orientation to time, place, person, recent and remote memory, attention span and concentration, language, and fund of knowledge is normal.  Speech is not dysarthric.  CRANIAL  NERVES: II:  No visual field defects.  Unremarkable fundi.   III-IV-VI: Pupils equal round and reactive to light.  Normal conjugate, extra-ocular eye movements in all directions of gaze.  No nystagmus.  No ptosis.   V:  Normal facial sensation.     VII:  Normal facial symmetry and movements.  No pathologic facial reflexes.  VIII:  Normal hearing and vestibular function.   IX-X:  Normal palatal movement.   XI:  Normal shoulder shrug and head rotation.   XII:  Normal tongue strength and range of motion, no deviation or fasciculation.  MOTOR:  No atrophy, fasciculations or abnormal movements.  No pronator drift.  Tone is normal.    Right Upper Extremity:    Left Upper Extremity:    Deltoid  5/5   Deltoid  5/5   Biceps  5/5   Biceps  5/5   Triceps  5/5   Triceps  5/5   Wrist extensors  5/5   Wrist extensors  5/5   Wrist flexors  5/5   Wrist flexors  5/5   Finger extensors  5/5   Finger extensors  5/5   Finger flexors  5/5   Finger flexors  5/5   Dorsal interossei  5/5   Dorsal interossei  5/5   Abductor pollicis  5/5   Abductor pollicis  5/5   Tone (Ashworth scale)  0  Tone (Ashworth scale)  0   Right Lower Extremity:    Left Lower Extremity:    Hip flexors  5/5   Hip flexors  5/5   Hip extensors  5/5   Hip extensors  5/5   Knee flexors  5/5   Knee flexors  5/5   Knee extensors  5/5   Knee extensors  5/5   Dorsiflexors  5/5   Dorsiflexors  5/5   Plantarflexors  5/5   Plantarflexors  5/5   Toe extensors  5/5   Toe extensors  5/5   Toe flexors  5/5   Toe flexors  5/5   Tone (Ashworth scale)  0  Tone (Ashworth scale)  0   MSRs:  Right  Left brachioradialis 2+  brachioradialis 2+  biceps 2+  biceps 2+  triceps 2+  triceps 2+  patellar 2+  patellar 2+  ankle jerk 1+  ankle jerk 1+  Hoffman no  Hoffman no  plantar response down  plantar response down   SENSORY:  Normal and symmetric perception of light touch, pinprick,  vibration, and proprioception.   COORDINATION/GAIT: Normal finger-to- nose-finger.  Intact rapid alternating movements bilaterally.  Able to rise from a chair without using arms.  Gait wide-based, slow, but stable.  Unsteady with tandem gait.   IMPRESSION: Mrs. Thomassen is a 54 year-old female with history of breast cancer s/p chemotherapy and radiation presenting for evaluation of a constellation for symptoms including chronic myalgias, generalized pain, paresthesias of the feet, and malaise.  MRI of her thoracic and lumbar spine was personally reviewed and does not shows changes severe enough to explain the her symptoms.   Although she may have neuropathy due to chemotherapy, it would not explain the generalized nature of her pain or myalgias.  I will check neuropathy labs as well as NCS/EMG of the left arm and right leg to better characterize the nature of her symptoms.  Other consideration include fibromyalgia, chronic pain syndrome.  PLAN/RECOMMENDATIONS:  1.  Check TSH, vitamin B12, and vitamin D 2.  EMG of the LEFT arm and RIGHT leg 3.  Consider physical therapy going forward  Return to clinic in 2 months.   The duration of this appointment visit was 40 minutes of face-to-face time with the patient.  Greater than 50% of this time was spent in counseling, explanation of diagnosis, planning of further management, and coordination of care.   Thank you for allowing me to participate in patient's care.  If I can answer any additional questions, I would be pleased to do so.    Sincerely,    Donika K. Posey Pronto, DO

## 2015-09-02 NOTE — Patient Instructions (Addendum)
1.  Check blood work 2.  EMG of the left arm and right leg 3.  Return to clinic in 2-3 months

## 2015-09-05 ENCOUNTER — Other Ambulatory Visit: Payer: Self-pay | Admitting: Oncology

## 2015-09-13 ENCOUNTER — Encounter: Payer: BLUE CROSS/BLUE SHIELD | Admitting: Neurology

## 2015-09-26 ENCOUNTER — Encounter: Payer: Self-pay | Admitting: Family Medicine

## 2015-09-27 ENCOUNTER — Encounter: Payer: BLUE CROSS/BLUE SHIELD | Admitting: Neurology

## 2015-09-28 ENCOUNTER — Other Ambulatory Visit: Payer: Self-pay | Admitting: Family Medicine

## 2015-09-28 DIAGNOSIS — M25569 Pain in unspecified knee: Secondary | ICD-10-CM

## 2015-09-28 DIAGNOSIS — R937 Abnormal findings on diagnostic imaging of other parts of musculoskeletal system: Secondary | ICD-10-CM

## 2015-10-02 ENCOUNTER — Telehealth: Payer: Self-pay | Admitting: Family Medicine

## 2015-10-02 NOTE — Telephone Encounter (Signed)
lmom of pt new appt time on 11/16/15 is at 9:30

## 2015-10-12 ENCOUNTER — Ambulatory Visit: Payer: BLUE CROSS/BLUE SHIELD | Admitting: Neurology

## 2015-11-07 ENCOUNTER — Encounter: Payer: Self-pay | Admitting: Internal Medicine

## 2015-11-16 ENCOUNTER — Ambulatory Visit: Payer: BLUE CROSS/BLUE SHIELD | Admitting: Family Medicine

## 2015-11-21 ENCOUNTER — Encounter: Payer: Self-pay | Admitting: Family Medicine

## 2015-11-21 ENCOUNTER — Ambulatory Visit (INDEPENDENT_AMBULATORY_CARE_PROVIDER_SITE_OTHER): Payer: BLUE CROSS/BLUE SHIELD | Admitting: Family Medicine

## 2015-11-21 VITALS — BP 140/84 | HR 84 | Temp 98.6°F | Resp 16 | Ht 66.0 in | Wt 248.0 lb

## 2015-11-21 DIAGNOSIS — E785 Hyperlipidemia, unspecified: Secondary | ICD-10-CM | POA: Diagnosis not present

## 2015-11-21 DIAGNOSIS — I10 Essential (primary) hypertension: Secondary | ICD-10-CM

## 2015-11-21 LAB — LIPID PANEL
CHOL/HDL RATIO: 5.3 ratio — AB (ref <=5.0)
CHOLESTEROL: 212 mg/dL — AB (ref 125–200)
HDL: 40 mg/dL — ABNORMAL LOW (ref >=46)
LDL CALC: 139 mg/dL — AB (ref <130)
TRIGLYCERIDES: 166 mg/dL — AB (ref <150)
VLDL: 33 mg/dL — AB (ref <30)

## 2015-11-21 NOTE — Progress Notes (Signed)
   Subjective:    Patient ID: Julia Zuniga, female    DOB: September 21, 1961, 54 y.o.   MRN: [id number]  HPI Patient presents for follow up of HTN, hyperlipidemia. Her joint pain resolved after she stopped lipitor. She has noticed improvement in neuropathy. Knee pain resolved. Tolerating medication well. She is concerned about ASCVD as her mother had CABG in her early 6s without history of DM, obesity.   Her husband had a heart attack last week requiring stent placement. He has stopped smoking. She knows they weill be changing their diet and trying to exercise more.    Review of Systems No chest pain, no SOB, no edema    Objective:   Physical Exam Physical Exam  Constitutional: Oriented to person, place, and time. She appears well-developed and well-nourished.  HENT:  Head: Normocephalic and atraumatic.  Eyes: Conjunctivae are normal.  Neck: Normal range of motion. Neck supple.  Cardiovascular: Normal rate, regular rhythm and normal heart sounds.   Pulmonary/Chest: Effort normal and breath sounds normal.  Musculoskeletal: Normal range of motion.  Neurological: Alert and oriented to person, place, and time.  Skin: Skin is warm and dry.  Psychiatric: Normal mood and affect. Behavior is normal. Judgment and thought content normal.  Vitals reviewed.  BP 139/93 mmHg  Pulse 84  Temp(Src) 98.6 F (37 C)  Resp 16  Ht 5\' 6"  (1.676 m)  Wt 248 lb (112.492 kg)  BMI 40.05 kg/m2  LMP 01/07/2009 Wt Readings from Last 3 Encounters:  11/21/15 248 lb (112.492 kg)  09/02/15 245 lb 4 oz (111.245 kg)  08/31/15 249 lb 12.8 oz (113.309 kg)  Recheck blood pressure- 140/84 (she has not had meds this am)     Assessment & Plan:  1. Hyperlipidemia - Lipid panel - Discussed dietary modifications and use of fish oil supplements, will await results of FLP and develop plan  2. Essential hypertension - continue current meds - Lipid panel (CMP, CBC nml 08/31/15) - encouraged self care measures,  exercise, relaxation  - follow up 6 months for CPE   Clarene Reamer, FNP-BC  [facility]  11/21/2015 8:37 AM

## 2015-11-21 NOTE — Addendum Note (Signed)
Addended by: Kyra Manges on: 11/21/2015 08:55 AM   Modules accepted: Miquel Dunn

## 2015-11-21 NOTE — Patient Instructions (Addendum)
For nasal congestion- can use Afrin for 3 days  Increase fruits and vegetables, avoid sugar, white flour    Why follow it? Research shows. . Those who follow the Mediterranean diet have a reduced risk of heart disease  . The diet is associated with a reduced incidence of Parkinson's and Alzheimer's diseases . People following the diet may have longer life expectancies and lower rates of chronic diseases  . The Dietary Guidelines for Americans recommends the Mediterranean diet as an eating plan to promote health and prevent disease  What Is the Mediterranean Diet?  . Healthy eating plan based on typical foods and recipes of Mediterranean-style cooking . The diet is primarily a plant based diet; these foods should make up a majority of meals   Starches - Plant based foods should make up a majority of meals - They are an important sources of vitamins, minerals, energy, antioxidants, and fiber - Choose whole grains, foods high in fiber and minimally processed items  - Typical grain sources include wheat, oats, barley, corn, brown rice, bulgar, farro, millet, polenta, couscous  - Various types of beans include chickpeas, lentils, fava beans, black beans, white beans   Fruits  Veggies - Large quantities of antioxidant rich fruits & veggies; 6 or more servings  - Vegetables can be eaten raw or lightly drizzled with oil and cooked  - Vegetables common to the traditional Mediterranean Diet include: artichokes, arugula, beets, broccoli, brussel sprouts, cabbage, carrots, celery, collard greens, cucumbers, eggplant, kale, leeks, lemons, lettuce, mushrooms, okra, onions, peas, peppers, potatoes, pumpkin, radishes, rutabaga, shallots, spinach, sweet potatoes, turnips, zucchini - Fruits common to the Mediterranean Diet include: apples, apricots, avocados, cherries, clementines, dates, figs, grapefruits, grapes, melons, nectarines, oranges, peaches, pears, pomegranates, strawberries, tangerines  Fats - Replace  butter and margarine with healthy oils, such as olive oil, canola oil, and tahini  - Limit nuts to no more than a handful a day  - Nuts include walnuts, almonds, pecans, pistachios, pine nuts  - Limit or avoid candied, honey roasted or heavily salted nuts - Olives are central to the [employer] - can be eaten whole or used in a variety of dishes   Meats Protein - Limiting red meat: no more than a few times a month - When eating red meat: choose lean cuts and keep the portion to the size of deck of cards - Eggs: approx. 0 to 4 times a week  - Fish and lean poultry: at least 2 a week  - Healthy protein sources include, chicken, [place], lean beef, lamb - Increase intake of seafood such as tuna, salmon, trout, mackerel, shrimp, scallops - Avoid or limit high fat processed meats such as sausage and bacon  Dairy - Include moderate amounts of low fat dairy products  - Focus on healthy dairy such as fat free yogurt, skim milk, low or reduced fat cheese - Limit dairy products higher in fat such as whole or 2% milk, cheese, ice cream  Alcohol - Moderate amounts of red wine is ok  - No more than 5 oz daily for women (all ages) and men older than age 63  - No more than 10 oz of wine daily for men younger than 37  Other - Limit sweets and other desserts  - Use herbs and spices instead of salt to flavor foods  - Herbs and spices common to the traditional Mediterranean Diet include: basil, bay leaves, chives, cloves, cumin, fennel, garlic, lavender, marjoram, mint, oregano, parsley, pepper, rosemary,  sage, savory, sumac, tarragon, thyme   It's not just a diet, it's a lifestyle:  . The Mediterranean diet includes lifestyle factors typical of those in the region  . Foods, drinks and meals are best eaten with others and savored . Daily physical activity is important for overall good health . This could be strenuous exercise like running and aerobics . This could also be more leisurely activities such  as walking, housework, yard-work, or taking the stairs . Moderation is the key; a balanced and healthy diet accommodates most foods and drinks . Consider portion sizes and frequency of consumption of certain foods   Meal Ideas & Options:  . Breakfast:  o Whole wheat toast or whole wheat English muffins with peanut butter & hard boiled egg o Steel cut oats topped with apples & cinnamon and skim milk  o Fresh fruit: banana, strawberries, melon, berries, peaches  o Smoothies: strawberries, bananas, greek yogurt, peanut butter o Low fat greek yogurt with blueberries and granola  o Egg white omelet with spinach and mushrooms o Breakfast couscous: whole wheat couscous, apricots, skim milk, cranberries  . Sandwiches:  o Hummus and grilled vegetables (peppers, zucchini, squash) on whole wheat bread   o Grilled chicken on whole wheat pita with lettuce, tomatoes, cucumbers or tzatziki  o Tuna salad on whole wheat bread: tuna salad made with greek yogurt, olives, red peppers, capers, green onions o Garlic rosemary lamb pita: lamb sauted with garlic, rosemary, salt & pepper; add lettuce, cucumber, greek yogurt to pita - flavor with lemon juice and black pepper  . Seafood:  o Mediterranean grilled salmon, seasoned with garlic, basil, parsley, lemon juice and black pepper o Shrimp, lemon, and spinach whole-grain pasta salad made with low fat greek yogurt  o Seared scallops with lemon orzo  o Seared tuna steaks seasoned salt, pepper, coriander topped with tomato mixture of olives, tomatoes, olive oil, minced garlic, parsley, green onions and cappers  . Meats:  o Herbed greek chicken salad with kalamata olives, cucumber, feta  o Red bell peppers stuffed with spinach, bulgur, lean ground beef (or lentils) & topped with feta   o Kebabs: skewers of chicken, tomatoes, onions, zucchini, squash  o [place] burgers: made with red onions, mint, dill, lemon juice, feta cheese topped with roasted red  peppers . Vegetarian o Cucumber salad: cucumbers, artichoke hearts, celery, red onion, feta cheese, tossed in olive oil & lemon juice  o Hummus and whole grain pita points with a greek salad (lettuce, tomato, feta, olives, cucumbers, red onion) o Lentil soup with celery, carrots made with vegetable broth, garlic, salt and pepper  o Tabouli salad: parsley, bulgur, mint, scallions, cucumbers, tomato, radishes, lemon juice, olive oil, salt and pepper.

## 2015-11-23 ENCOUNTER — Other Ambulatory Visit: Payer: Self-pay | Admitting: Family Medicine

## 2015-11-23 ENCOUNTER — Encounter: Payer: Self-pay | Admitting: Family Medicine

## 2015-11-23 DIAGNOSIS — I10 Essential (primary) hypertension: Secondary | ICD-10-CM

## 2015-11-23 MED ORDER — CARVEDILOL 6.25 MG PO TABS
ORAL_TABLET | ORAL | Status: DC
Start: 2015-11-23 — End: 2016-05-10

## 2015-11-24 ENCOUNTER — Encounter: Payer: Self-pay | Admitting: Family Medicine

## 2015-11-25 MED ORDER — CARVEDILOL 6.25 MG PO TABS: ORAL_TABLET | ORAL | Status: DC

## 2015-11-25 NOTE — Addendum Note (Signed)
Addended by: Jannette Spanner on: 11/25/2015 12:00 PM   Modules accepted: Orders

## 2015-11-27 ENCOUNTER — Other Ambulatory Visit: Payer: Self-pay | Admitting: Family Medicine

## 2015-11-29 ENCOUNTER — Other Ambulatory Visit: Payer: Self-pay | Admitting: Family Medicine

## 2015-11-29 ENCOUNTER — Ambulatory Visit
Admission: RE | Admit: 2015-11-29 | Discharge: 2015-11-29 | Disposition: A | Discharge status: Home / Self Care | Payer: BLUE CROSS/BLUE SHIELD | Source: Ambulatory Visit

## 2015-11-29 DIAGNOSIS — E785 Hyperlipidemia, unspecified: Secondary | ICD-10-CM

## 2015-11-29 DIAGNOSIS — Z9889 Other specified postprocedural states: Secondary | ICD-10-CM

## 2015-11-29 MED ORDER — ROSUVASTATIN CALCIUM 10 MG PO TABS: 10.0000 mg | ORAL_TABLET | Freq: Every day | ORAL | Status: DC

## 2016-02-20 ENCOUNTER — Encounter: Payer: Self-pay | Admitting: Family Medicine

## 2016-02-21 ENCOUNTER — Telehealth: Payer: Self-pay | Admitting: Family Medicine

## 2016-02-21 DIAGNOSIS — J111 Influenza due to unidentified influenza virus with other respiratory manifestations: Secondary | ICD-10-CM

## 2016-02-21 MED ORDER — OSELTAMIVIR PHOSPHATE 75 MG PO CAPS: 75.0000 mg | ORAL_CAPSULE | Freq: Two times a day (BID) | ORAL | Status: DC

## 2016-02-21 NOTE — Telephone Encounter (Signed)
Patient was exposed to flu from her daughter last week. The patient started feeling bad yesterday with severe chills, fever to 102.4, occasional dry cough. Taking ibuprofen and tylenol and drinking a lot of fluids. No SOB, no wheeze. Discussed symptomatic treatment, RTC precautions. Tamiflu prescription sent to patient's pharmacy.

## 2016-05-10 ENCOUNTER — Other Ambulatory Visit: Payer: Self-pay | Admitting: *Deleted

## 2016-05-10 ENCOUNTER — Other Ambulatory Visit (HOSPITAL_BASED_OUTPATIENT_CLINIC_OR_DEPARTMENT_OTHER): Payer: BLUE CROSS/BLUE SHIELD

## 2016-05-10 ENCOUNTER — Telehealth: Payer: Self-pay | Admitting: Oncology

## 2016-05-10 ENCOUNTER — Ambulatory Visit (HOSPITAL_BASED_OUTPATIENT_CLINIC_OR_DEPARTMENT_OTHER): Payer: BLUE CROSS/BLUE SHIELD | Admitting: Oncology

## 2016-05-10 VITALS — BP 131/82 | HR 78 | Temp 98.8°F | Resp 20 | Ht 66.0 in | Wt 237.4 lb

## 2016-05-10 DIAGNOSIS — Z853 Personal history of malignant neoplasm of breast: Secondary | ICD-10-CM

## 2016-05-10 DIAGNOSIS — C50411 Malignant neoplasm of upper-outer quadrant of right female breast: Secondary | ICD-10-CM

## 2016-05-10 DIAGNOSIS — D509 Iron deficiency anemia, unspecified: Secondary | ICD-10-CM

## 2016-05-10 DIAGNOSIS — C50419 Malignant neoplasm of upper-outer quadrant of unspecified female breast: Secondary | ICD-10-CM

## 2016-05-10 LAB — CBC WITH DIFFERENTIAL/PLATELET
BASO%: 1.2 % (ref 0.0–2.0)
BASOS ABS: 0.1 10*3/uL (ref 0.0–0.1)
EOS ABS: 0.1 10*3/uL (ref 0.0–0.5)
EOS%: 1.5 % (ref 0.0–7.0)
HEMATOCRIT: 39.7 % (ref 34.8–46.6)
HEMOGLOBIN: 12.7 g/dL (ref 11.6–15.9)
LYMPH#: 2.4 10*3/uL (ref 0.9–3.3)
LYMPH%: 30.8 % (ref 14.0–49.7)
MCH: 24.7 pg — AB (ref 25.1–34.0)
MCHC: 32.1 g/dL (ref 31.5–36.0)
MCV: 77 fL — AB (ref 79.5–101.0)
MONO#: 0.5 10*3/uL (ref 0.1–0.9)
MONO%: 6 % (ref 0.0–14.0)
NEUT%: 60.5 % (ref 38.4–76.8)
NEUTROS ABS: 4.7 10*3/uL (ref 1.5–6.5)
PLATELETS: 224 10*3/uL (ref 145–400)
RBC: 5.15 10*6/uL (ref 3.70–5.45)
RDW: 13.8 % (ref 11.2–14.5)
WBC: 7.7 10*3/uL (ref 3.9–10.3)

## 2016-05-10 LAB — COMPREHENSIVE METABOLIC PANEL
ALBUMIN: 4.4 g/dL (ref 3.5–5.0)
ALT: 30 U/L (ref 0–55)
ANION GAP: 10 meq/L (ref 3–11)
AST: 16 U/L (ref 5–34)
Alkaline Phosphatase: 108 U/L (ref 40–150)
BILIRUBIN TOTAL: 0.54 mg/dL (ref 0.20–1.20)
BUN: 13.3 mg/dL (ref 7.0–26.0)
CO2: 27 mEq/L (ref 22–29)
Calcium: 10.4 mg/dL (ref 8.4–10.4)
Chloride: 105 mEq/L (ref 98–109)
Creatinine: 0.7 mg/dL (ref 0.6–1.1)
GLUCOSE: 106 mg/dL (ref 70–140)
Potassium: 4.2 mEq/L (ref 3.5–5.1)
Sodium: 142 mEq/L (ref 136–145)
TOTAL PROTEIN: 8.1 g/dL (ref 6.4–8.3)

## 2016-05-10 NOTE — Progress Notes (Signed)
[facility]  [phone]) [phone] Fax:([phone]) [phone]     ID: Georgiann Mccoy DOB: September 01, 1961  MR#: [id number]  YHC#:[id number]  Patient Care Team: Leandrew Koyanagi, MD as PCP - General (Family Medicine) Elby Beck, FNP as Nurse Practitioner (Nurse Practitioner) PCP: Leandrew Koyanagi, MD GYN: Molli Posey MD SU: Stark Klein MD OTHER MD: Thea Silversmith MD  CHIEF COMPLAINT: estrogen receptor negative breast cancer  CURRENT TREATMENT: Observation   BREAST CANCER HISTORY: From doctor calcium tends 12/17/2012 intake note:  Julia Zuniga is a 55 y.o. female. With medical history significant for hyperlipidemia diverticulitis and hiatal hernia. She presented for a screening mammogram in December 2013 that showed an area of abnormality on the right. This measured about 1.6 cm. 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 HER-2/neu was amplified at 3.88. Patient did not have a MRI scan performed. Post biopsy she is doing well. She is without any complaints. Patient case was presented at the multidisciplinary breast conference. She is seen today by Dr. Thea Silversmith and Dr. Theda Sers. Patient does desire breast conservation.  The patient's subsequent history is as detailed below  INTERVAL HISTORY: Sharren returns today for followup of her HER-2/neu positive, estrogen receptor negative breast cancer. The interval history is unremarkable. She had her most recent mammogram December 2016 with no evidence of disease recurrence.  REVIEW OF SYSTEMS: She has gone on a low carb diet and lost 15 pounds. She is very excited by this. She has some joint pains here and there which are not more persistent or intense than before. She was switched from Lipitor to Crestor and that seems to have helped. In fact she thinks the neuropathy she supposedly experience from  chemotherapy was really due to the Lipitor. Aside from these issues a detailed review of systems today was noncontributory  PAST MEDICAL HISTORY: Past Medical History  Diagnosis Date  . Hyperlipidemia   . Diverticulitis     2012  . Heartburn   . Anxiety   . Hot flashes   . PONV (postoperative nausea and vomiting)   . Breast cancer ([facility]) 12/08/12    Right  . History of radiation therapy 05/13/13-06/18/13    right breast, 5000 cGy 25 sessions  . Anemia   . 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 GERD (gastroesophageal reflux disease)   . Hypertension     PAST SURGICAL HISTORY: Past Surgical History  Procedure Laterality Date  . Cesarean section      x 3  . Tubal ligation    . Tonsillectomy and adenoidectomy    . Cholecystectomy  2001    lap choli  . Breast lumpectomy with sentinel lymph node biopsy  12/31/2012    Procedure: BREAST LUMPECTOMY WITH SENTINEL LYMPH NODE BX;  Surgeon: Stark Klein, MD;  Location: [facility];  Service: General;  Laterality: Right;  . Portacath placement  12/31/2012    Procedure: INSERTION PORT-A-CATH;  Surgeon: Stark Klein, MD;  Location: [facility];  Service: General;  Laterality: Left;  Left Subclavian Vein  . Re-excision of breast cancer,superior margins  01/14/2013    Procedure: RE-EXCISION OF BREAST CANCER,SUPERIOR MARGINS;  Surgeon: Stark Klein, MD;  Location: [facility];  Service: General;  Laterality: Right;  right breast re excision for markers   . Port-a-cath removal Left 02/24/2014  Procedure: REMOVAL PORT-A-CATH;  Surgeon: Stark Klein, MD;  Location: [facility];  Service: General;  Laterality: Left;    FAMILY HISTORY Family History  Problem Relation Age of Onset  . Stomach cancer Father   . Lung cancer Maternal Uncle   . Skin cancer Maternal Grandmother   . Lung cancer Maternal Grandfather   . Lymphoma Cousin   . Colon cancer Neg Hx   . Esophageal cancer Neg Hx   .  Rectal cancer Neg Hx    The patient's father died at the age of 52 from stomach cancer. The patient's mother is alive at age 74. Kynadee is a single child. There is no history of breast or ovarian cancer in the family to her knowledge.  GYNECOLOGIC HISTORY:  Patient's last menstrual period was 01/07/2009. Menarche age 42, first live birth age 75. She is GX P3. Her periods have stopped before she started chemotherapy. She never took hormone replacement.  SOCIAL HISTORY:  She is a Agricultural engineer. She has a daughter from her first marriage, [name], 54 years old, lives in [place] and works as a Pharmacist, hospital. Her second husband, [name], works as a Dealer. They have 2 daughters,[name], 53 years old, currently attending [employer], and [name], currently 16, at home. The patient attends a pleasant garden [place] [employer].    ADVANCED DIRECTIVES: Not in place   HEALTH MAINTENANCE: Social History  Substance Use Topics  . Smoking status: Never Smoker   . Smokeless tobacco: Never Used  . Alcohol Use: 0.0 oz/week    0 Standard drinks or equivalent per week     Comment: occ     Colonoscopy:  PAP:  Bone density:  Lipid panel:  Allergies  Allergen Reactions  . Chlorhexidine     Patient skin gets bright red with rash.      . Codeine Swelling  . Medralone [Methylprednisolone Acetate]     "coming out of skin"   . Oxycodone-Acetaminophen Other (See Comments)    Face got red and hot    Current Outpatient Prescriptions  Medication Sig Dispense Refill  . B Complex Vitamins (VITAMIN B COMPLEX PO) Take 1 tablet by mouth daily.    . carvedilol (COREG) 6.25 MG tablet TAKE ONE TABLET BY MOUTH TWICE DAILY WITH MEALS. 180 tablet 1  . carvedilol (COREG) 6.25 MG tablet TAKE ONE TABLET BY MOUTH TWICE DAILY WITH MEALS. 180 tablet 1  . Cholecalciferol (D3-1000) 1000 UNITS capsule Take 1,000 Units by mouth daily.    Marland Kitchen ibuprofen (ADVIL,MOTRIN) 200 MG tablet Take 400 mg by mouth every 6 (six) hours as needed. Pain      . LORazepam (ATIVAN) 0.5 MG tablet Take 1 tablet (0.5 mg total) by mouth 3 (three) times daily as needed for anxiety. 60 tablet 5  . Multiple Vitamins-Minerals (MULTIVITAMIN WITH MINERALS) tablet Take 1 tablet by mouth daily.    Marland Kitchen omeprazole (PRILOSEC) 40 MG capsule Take 1 capsule (40 mg total) by mouth daily. 90 capsule 3  . oseltamivir (TAMIFLU) 75 MG capsule Take 1 capsule (75 mg total) by mouth 2 (two) times daily. 10 capsule 0  . Probiotic Product (PROBIOTIC DAILY PO) Take 1 tablet by mouth daily.    . rosuvastatin (CRESTOR) 10 MG tablet Take 1 tablet (10 mg total) by mouth daily. 90 tablet 1   No current facility-administered medications for this visit.    OBJECTIVE: Middle-aged white woman Who appears well  Filed Vitals:   05/10/16 1010  BP: 131/82  Pulse: 78  Temp: 98.8 F (37.1 C)  Resp: 20     Body mass index is 38.34 kg/(m^2).    ECOG FS:0 - Asymptomatic  Sclerae unicteric, EOMs intact Oropharynx clear, no thrush or other lesions No cervical or supraclavicular adenopathy Lungs no rales or rhonchi Heart regular rate and rhythm Abd soft, obese, nontender, positive bowel sounds MSK no focal spinal tenderness, no upper extremity lymphedema Neuro: nonfocal, well oriented, appropriate affect Breasts: The right breast is status post lumpectomy and radiation. There is no evidence of local recurrence. The cosmetic result is good. The right axilla is benign. The left breast is unremarkable.  LAB RESULTS:  CMP     Component Value Date/Time   NA 142 08/31/2015 1450   NA 143 02/24/2014 0802   K 4.3 08/31/2015 1450   K 4.2 02/24/2014 0802   CL 106 02/24/2014 0802   CL 106 06/01/2013 1045   CO2 26 08/31/2015 1450   CO2 29 02/24/2013 1857   GLUCOSE 97 08/31/2015 1450   GLUCOSE 96 02/24/2014 0802   GLUCOSE 94 06/01/2013 1045   BUN 12.5 08/31/2015 1450   BUN 11 02/24/2014 0802   CREATININE 0.7 08/31/2015 1450   CREATININE 0.60 02/24/2014 0802   CALCIUM 10.1 08/31/2015  1450   CALCIUM 9.2 02/24/2013 1857   PROT 8.1 08/31/2015 1450   PROT 6.8 02/24/2013 1857   ALBUMIN 4.5 08/31/2015 1450   ALBUMIN 3.8 02/24/2013 1857   AST 18 08/31/2015 1450   AST 18 02/24/2013 1857   ALT 27 08/31/2015 1450   ALT 30 02/24/2013 1857   ALKPHOS 119 08/31/2015 1450   ALKPHOS 113 02/24/2013 1857   BILITOT 0.32 08/31/2015 1450   BILITOT 0.2* 02/24/2013 1857   GFRNONAA >90 02/24/2013 1857   GFRAA >90 02/24/2013 1857    I No results found for: SPEP  Lab Results  Component Value Date   WBC 7.7 05/10/2016   NEUTROABS 4.7 05/10/2016   HGB 12.7 05/10/2016   HCT 39.7 05/10/2016   MCV 77.0* 05/10/2016   PLT 224 05/10/2016      Chemistry      Component Value Date/Time   NA 142 08/31/2015 1450   NA 143 02/24/2014 0802   K 4.3 08/31/2015 1450   K 4.2 02/24/2014 0802   CL 106 02/24/2014 0802   CL 106 06/01/2013 1045   CO2 26 08/31/2015 1450   CO2 29 02/24/2013 1857   BUN 12.5 08/31/2015 1450   BUN 11 02/24/2014 0802   CREATININE 0.7 08/31/2015 1450   CREATININE 0.60 02/24/2014 0802      Component Value Date/Time   CALCIUM 10.1 08/31/2015 1450   CALCIUM 9.2 02/24/2013 1857   ALKPHOS 119 08/31/2015 1450   ALKPHOS 113 02/24/2013 1857   AST 18 08/31/2015 1450   AST 18 02/24/2013 1857   ALT 27 08/31/2015 1450   ALT 30 02/24/2013 1857   BILITOT 0.32 08/31/2015 1450   BILITOT 0.2* 02/24/2013 1857       Lab Results  Component Value Date   LABCA2 38 12/17/2012    No components found for: [id number]  No results for input(s): INR in the last 168 hours.  Urinalysis    Component Value Date/Time   COLORURINE YELLOW 02/24/2013 1856   APPEARANCEUR CLEAR 02/24/2013 1856   LABSPEC 1.008 02/24/2013 1856   PHURINE 6.0 02/24/2013 1856   GLUCOSEU NEGATIVE 02/24/2013 1856   HGBUR LARGE* 02/24/2013 1856   BILIRUBINUR Negative 08/17/2015 [place] NEGATIVE 02/24/2013 1856   KETONESUR  NEGATIVE 02/24/2013 1856   PROTEINUR Negative 08/17/2015 1454    PROTEINUR NEGATIVE 02/24/2013 1856   UROBILINOGEN 0.2 08/17/2015 1454   UROBILINOGEN 0.2 02/24/2013 1856   NITRITE Negative 08/17/2015 1454   NITRITE NEGATIVE 02/24/2013 1856   LEUKOCYTESUR Negative 08/17/2015 1454    STUDIES:     CLINICAL DATA: 55 year old with malignant lumpectomy of the upper outer quadrant of the right breast in January, 2014, pathology 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 Adjuvant chemotherapy and radiation therapy. Annual evaluation.  EXAM: DIGITAL DIAGNOSTIC BILATERAL MAMMOGRAM WITH 3D TOMOSYNTHESIS AND CAD  COMPARISON: 11/26/2014 and earlier.  ACR Breast Density Category b: There are scattered areas of fibroglandular density.  FINDINGS: CC and MLO views of both breasts with tomosynthesis and a spot magnification tangential view of the lumpectomy site in the right breast were obtained. Scarring at the lumpectomy site in the upper outer right breast with further retraction of the scar. Residual mild post radiation trabecular thickening and skin thickening involving the right breast. No new or suspicious findings in the right breast.  No findings suspicious for malignancy in the left breast.  Mammographic images were processed with CAD.  IMPRESSION: No mammographic evidence of malignancy involving either breast. Expected post lumpectomy and post radiation changes in the right breast.  RECOMMENDATION: Diagnostic bilateral mammogram is suggested in 1 year. (Code:DM-B-01Y)  I have discussed the findings and recommendations with the patient. Results were also provided in writing at the conclusion of the visit. If applicable, a reminder letter will be sent to the patient regarding the next appointment.  BI-RADS CATEGORY 2: Benign.   Electronically Signed  By: Evangeline Dakin M.D.  On: 11/29/2015  09:59   ASSESSMENT: 55 y.o. [place] woman  (1) status post right lumpectomy and sentinel lymph node sampling 12/31/2012 for a pT1c pN0, stage IA invasive ductal carcinoma, grade 3, estrogen and progesterone receptor negative, with an MIB-1 of 83%, and HER-2 amplified, with the signals ratio of 4.36  (2) adjuvant chemotherapy consisted of docetaxel and  carboplatin  given every 21 days x4 of 6 planned cycles, completed 03/30/2013; discontinued because of neuropathy, and given with concurrent  trastuzumab ,continued until 11/16/2013, discontinued because of a drop in  her  ejection fraction, which resolved on repeat 03/01/2014.  (3)  received adjuvant radiation therapy from 05/13/2013 through 07/10/201   (4) other problems include neuropathy, anxiety, a history of arthralgias and myalgias  (5) Iron deficiency anemia diagnosed on the basis of a low MCV, with history of prior normal MCV  PLAN: Jazara is now 3-1/2 years out from definitive surgery for her estrogen receptor negative breast cancer. This is very favorable.  She still having some arthralgias and myalgias which may be related to her Crestor. I think she would benefit from tai chi and I give her information on how to participate in that program.  Even though her ferritin has been in the normal range when checked, she has microcytic red cells currently, not associated with anemia, and she has a normal MCV in the past. I think this is diagnostic of borderline iron deficiency. She has not been able to take oral iron. For then a set her up for Feraheme next week. I think that will take care of that problem.  I encouraged her to persist on her current diet, which is working well for her and is not difficult for her.  I think she would  benefit from getting acquainted with our survivorship program. She is going to have her next mammogram in December. I will see if she can meet with our survivorship nurse practitioner in January 2018. Tanga will  then see me again a year from now and at that visit very likely she will be released from follow-up  She has a good understanding of this plan. She agrees with it. She will call with any problems that may develop before her next visit  Darnice Comrie C, MD   05/10/2016 10:31 AM

## 2016-05-10 NOTE — Telephone Encounter (Signed)
appt made and avs printed °

## 2016-05-15 ENCOUNTER — Ambulatory Visit (INDEPENDENT_AMBULATORY_CARE_PROVIDER_SITE_OTHER): Payer: BLUE CROSS/BLUE SHIELD | Admitting: Family Medicine

## 2016-05-15 ENCOUNTER — Encounter: Payer: Self-pay | Admitting: Family Medicine

## 2016-05-15 VITALS — BP 128/84 | HR 76 | Temp 98.6°F | Resp 16 | Ht 66.5 in | Wt 235.0 lb

## 2016-05-15 DIAGNOSIS — I10 Essential (primary) hypertension: Secondary | ICD-10-CM

## 2016-05-15 DIAGNOSIS — F419 Anxiety disorder, unspecified: Secondary | ICD-10-CM | POA: Diagnosis not present

## 2016-05-15 DIAGNOSIS — IMO0001 Reserved for inherently not codable concepts without codable children: Secondary | ICD-10-CM

## 2016-05-15 DIAGNOSIS — K2100 Gastro-esophageal reflux disease with esophagitis, without bleeding: Secondary | ICD-10-CM

## 2016-05-15 DIAGNOSIS — R229 Localized swelling, mass and lump, unspecified: Secondary | ICD-10-CM

## 2016-05-15 DIAGNOSIS — M79672 Pain in left foot: Secondary | ICD-10-CM

## 2016-05-15 DIAGNOSIS — G63 Polyneuropathy in diseases classified elsewhere: Secondary | ICD-10-CM | POA: Diagnosis not present

## 2016-05-15 DIAGNOSIS — C801 Malignant (primary) neoplasm, unspecified: Secondary | ICD-10-CM | POA: Diagnosis not present

## 2016-05-15 DIAGNOSIS — E785 Hyperlipidemia, unspecified: Secondary | ICD-10-CM | POA: Diagnosis not present

## 2016-05-15 DIAGNOSIS — K219 Gastro-esophageal reflux disease without esophagitis: Secondary | ICD-10-CM | POA: Diagnosis not present

## 2016-05-15 LAB — LIPID PANEL
Cholesterol: 152 mg/dL (ref 125–200)
HDL: 47 mg/dL (ref >=46)
LDL Cholesterol: 82 mg/dL (ref <130)
TRIGLYCERIDES: 114 mg/dL (ref <150)
Total CHOL/HDL Ratio: 3.2 Ratio (ref <=5.0)
VLDL: 23 mg/dL (ref <30)

## 2016-05-15 MED ORDER — CARVEDILOL 6.25 MG PO TABS: ORAL_TABLET | ORAL | Status: DC

## 2016-05-15 MED ORDER — OMEPRAZOLE 20 MG PO CPDR: 40.0000 mg | DELAYED_RELEASE_CAPSULE | Freq: Every day | ORAL | Status: DC

## 2016-05-15 MED ORDER — LORAZEPAM 0.5 MG PO TABS: 0.5000 mg | ORAL_TABLET | Freq: Three times a day (TID) | ORAL | Status: DC | PRN

## 2016-05-15 NOTE — Progress Notes (Signed)
Subjective:    Patient ID: Julia Zuniga, female    DOB: [birth year], 55 y.o.   MRN: [id number]  HPI This is a pleasant 55 yo female who presents today for follow up of HTN, hyperlipidemia, anxiety and GERD. She had annual follow up visit with Dr. Jana Hakim last week. He is going to do an iron infusion due to low MCV. Hgb/hct WNL. CMP normal.   Sees gyn, Dr. Matthew Saras. Last PAP 6/16- atrophy, negative for malignancy. Last mammo 11/29/15. Colonoscopy 03/26/14. TDAP 05/16/2011.   Left heel has been hurting for three weeks, worse with wearing shoes, usually wears flip flops. Does not have pain when barefoot. Has chronic swollen area on left heel for several years, no change for several years.   She has been watching diet and decreasing carbs, has lost 18 pounds! Is walking and riding exercise bike. Energy level ok, feels like it never rebounded after chemo.   Is currently taking Crestor qod. Thinks it may contribute to occasional joint/muscle pain.   Past Medical History  Diagnosis Date  . Hyperlipidemia   . Diverticulitis     2012  . Heartburn   . Anxiety   . Hot flashes   . PONV (postoperative nausea and vomiting)   . Breast cancer ([facility]) 12/08/12    Right  . History of radiation therapy 05/13/13-06/18/13    right breast, 5000 cGy 25 sessions  . Anemia   . 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 GERD (gastroesophageal reflux disease)   . Hypertension    Past Surgical History  Procedure Laterality Date  . Cesarean section      x 3  . Tubal ligation    . Tonsillectomy and adenoidectomy    . Cholecystectomy  2001    lap choli  . Breast lumpectomy with sentinel lymph node biopsy  12/31/2012    Procedure: BREAST LUMPECTOMY WITH SENTINEL LYMPH NODE BX;  Surgeon: Stark Klein, MD;  Location: [facility];  Service: General;  Laterality: Right;  . Portacath placement  12/31/2012    Procedure: INSERTION PORT-A-CATH;  Surgeon: Stark Klein, MD;  Location:  [facility];  Service: General;  Laterality: Left;  Left Subclavian Vein  . Re-excision of breast cancer,superior margins  01/14/2013    Procedure: RE-EXCISION OF BREAST CANCER,SUPERIOR MARGINS;  Surgeon: Stark Klein, MD;  Location[facility];  Service: General;  Laterality: Right;  right breast re excision for markers   . Port-a-cath removal Left 02/24/2014    Procedure: REMOVAL PORT-A-CATH;  Surgeon: Stark Klein, MD;  Location: [employer];  Service: General;  Laterality: Left;   Family History  Problem Relation Age of Onset  . Stomach cancer Father   . Lung cancer Maternal Uncle   . Skin cancer Maternal Grandmother   . Lung cancer Maternal Grandfather   . Lymphoma Cousin   . Colon cancer Neg Hx   . Esophageal cancer Neg Hx   . Rectal cancer Neg Hx    Social History  Substance Use Topics  . Smoking status: Never Smoker   . Smokeless tobacco: Never Used  . Alcohol Use: 0.0 oz/week    0 Standard drinks or equivalent per week     Comment: occ      Review of Systems No chest pain, no SOB, good energy level. + left foot pain    Objective:   Physical Exam Physical Exam  Constitutional: Oriented to person, place, and time.  She appears well-developed and well-nourished.  HENT:  Head: Normocephalic and atraumatic.  Eyes: Conjunctivae are normal.  Neck: Normal range of motion. Neck supple.  Cardiovascular: Normal rate, regular rhythm and normal heart sounds.   Pulmonary/Chest: Effort normal and breath sounds normal.  Musculoskeletal: Normal range of motion. Left achilles with .5 cm area fullness, soft, no erythema. No tenderness to palpation. She has high arches.  Neurological: Alert and oriented to person, place, and time.  Skin: Skin is warm and dry.  Psychiatric: Normal mood and affect. Behavior is normal. Judgment and thought content normal.  Vitals reviewed.     BP 128/84 mmHg  Pulse 76  Temp(Src) 98.6 F (37 C) (Oral)  Resp 16  Ht 5' 6.5"  (1.689 m)  Wt 235 lb (106.595 kg)  BMI 37.37 kg/m2  SpO2 98%  LMP 01/07/2009 Wt Readings from Last 3 Encounters:  05/15/16 235 lb (106.595 kg)  05/10/16 237 lb 6.4 oz (107.684 kg)  11/21/15 248 lb (112.492 kg)       Assessment & Plan:  1. Hyperlipidemia - fasting lipid panel - will wait on labs and decide on Crestor  2. Essential hypertension - carvedilol (COREG) 6.25 MG tablet; TAKE ONE TABLET BY MOUTH TWICE DAILY WITH MEALS.  Dispense: 180 tablet; Refill: 1  3. Neuropathy associated with cancer (HCC) - stable  4. Anxiety - managed with occasional use of ativan - LORazepam (ATIVAN) 0.5 MG tablet; Take 1 tablet (0.5 mg total) by mouth 3 (three) times daily as needed for anxiety.  Dispense: 60 tablet; Refill: 5  5. Gastroesophageal reflux disease with esophagitis - will decrease omeprazole to 20 mg since symptoms much improved, discuss slowly decreasing as tolerated  6. Subcutaneous nodule - Ambulatory referral to Podiatry  7. Heel pain, left - encouraged her to wear more supportive shoes, especially if she has an active day  - Ambulatory referral to Podiatry  8. Obesity - encouraged her to continue healthy food choices and exercise, suggested HIIT  - follow up in 6 months   Clarene Reamer, FNP-BC  [facility]  05/15/2016 10:28 AM

## 2016-05-15 NOTE — Patient Instructions (Addendum)
Look into HIIT- high intensity interval training  Keep up the good work with your diet and exercise  Wear supportive shoes when on your feet   IF you received an x-ray today, you will receive an invoice from [facility]. Please contact [facility] at [phone] with questions or concerns regarding your invoice.   IF you received labwork today, you will receive an invoice from [employer]. Please contact [facility] at [phone] with questions or concerns regarding your invoice.   Our billing staff will not be able to assist you with questions regarding bills from these companies.  You will be contacted with the lab results as soon as they are available. The fastest way to get your results is to activate your My Chart account. Instructions are located on the last page of this paperwork. If you have not heard from [place] regarding the results in 2 weeks, please contact this office.

## 2016-05-16 ENCOUNTER — Other Ambulatory Visit: Payer: Self-pay | Admitting: Family Medicine

## 2016-05-16 ENCOUNTER — Encounter: Payer: Self-pay | Admitting: Family Medicine

## 2016-05-16 DIAGNOSIS — E785 Hyperlipidemia, unspecified: Secondary | ICD-10-CM

## 2016-05-16 MED ORDER — ROSUVASTATIN CALCIUM 5 MG PO TABS: 5.0000 mg | ORAL_TABLET | Freq: Every day | ORAL | Status: DC

## 2016-05-17 ENCOUNTER — Ambulatory Visit (HOSPITAL_BASED_OUTPATIENT_CLINIC_OR_DEPARTMENT_OTHER): Payer: BLUE CROSS/BLUE SHIELD

## 2016-05-17 VITALS — BP 115/65 | HR 65 | Temp 98.4°F | Resp 18

## 2016-05-17 DIAGNOSIS — D509 Iron deficiency anemia, unspecified: Secondary | ICD-10-CM | POA: Diagnosis not present

## 2016-05-17 MED ORDER — SODIUM CHLORIDE 0.9 % IV SOLN
Freq: Once | INTRAVENOUS | Status: AC
  Administered 2016-05-17: 11:00:00 via INTRAVENOUS

## 2016-05-17 MED ORDER — FERUMOXYTOL INJECTION 510 MG/17 ML
510.0000 mg | Freq: Once | INTRAVENOUS | Status: AC
  Administered 2016-05-17: 510 mg via INTRAVENOUS
  Filled 2016-05-17: qty 17

## 2016-05-17 NOTE — Patient Instructions (Signed)

## 2016-05-21 ENCOUNTER — Ambulatory Visit: Payer: BLUE CROSS/BLUE SHIELD | Admitting: Family Medicine

## 2016-05-22 ENCOUNTER — Ambulatory Visit: Payer: BLUE CROSS/BLUE SHIELD | Admitting: Family Medicine

## 2016-06-05 ENCOUNTER — Encounter: Payer: Self-pay | Admitting: Family Medicine

## 2016-06-05 ENCOUNTER — Ambulatory Visit (INDEPENDENT_AMBULATORY_CARE_PROVIDER_SITE_OTHER): Payer: BLUE CROSS/BLUE SHIELD | Admitting: Family Medicine

## 2016-06-05 VITALS — BP 132/84 | HR 65 | Ht 66.0 in | Wt 233.5 lb

## 2016-06-05 DIAGNOSIS — K219 Gastro-esophageal reflux disease without esophagitis: Secondary | ICD-10-CM

## 2016-06-05 DIAGNOSIS — E538 Deficiency of other specified B group vitamins: Secondary | ICD-10-CM | POA: Insufficient documentation

## 2016-06-05 DIAGNOSIS — E785 Hyperlipidemia, unspecified: Secondary | ICD-10-CM

## 2016-06-05 DIAGNOSIS — F419 Anxiety disorder, unspecified: Secondary | ICD-10-CM | POA: Diagnosis not present

## 2016-06-05 DIAGNOSIS — I10 Essential (primary) hypertension: Secondary | ICD-10-CM

## 2016-06-05 DIAGNOSIS — E559 Vitamin D deficiency, unspecified: Secondary | ICD-10-CM

## 2016-06-05 DIAGNOSIS — C50411 Malignant neoplasm of upper-outer quadrant of right female breast: Secondary | ICD-10-CM | POA: Diagnosis not present

## 2016-06-05 NOTE — Assessment & Plan Note (Signed)
>>  ASSESSMENT AND PLAN FOR ANXIETY WRITTEN ON 06/05/2016 10:01 PM BY OPALSKI, [place], DO  Well controlled

## 2016-06-05 NOTE — Assessment & Plan Note (Signed)
Well controlled 

## 2016-06-05 NOTE — Assessment & Plan Note (Signed)
Discussed with patient importance of continuing to follow-up with oncology docs and all for appropriate screenings, etc.

## 2016-06-05 NOTE — Assessment & Plan Note (Signed)
tol crestor well, obtain labs near future

## 2016-06-05 NOTE — Assessment & Plan Note (Signed)
Controlled.  Continue carvedilol ?

## 2016-06-05 NOTE — Patient Instructions (Addendum)
Extensive counseling done regarding weight loss.  Dietary and lifestyle modifications discussed in details.  Patient's desired goals: - Cont my fitness pal tracking of foods  - exercise at moderate intensity 40min /d for 2 wks, then inc by 42min each week as tolerated by your knees.  - 1 mo: wt loss goals=  8 pounds  - eat low gylcemic indexed carbs- I gave pt handouts and websites to go to for further information        Exercising to Lose Weight Exercising can help you to lose weight. In order to lose weight through exercise, you need to do vigorous-intensity exercise. You can tell that you are exercising with vigorous intensity if you are breathing very hard and fast and cannot hold a conversation while exercising. Moderate-intensity exercise helps to maintain your current weight. You can tell that you are exercising at a moderate level if you have a higher heart rate and faster breathing, but you are still able to hold a conversation. HOW OFTEN SHOULD I EXERCISE? Choose an activity that you enjoy and set realistic goals. Your health care provider can help you to make an activity plan that works for you. Exercise regularly as directed by your health care provider. This may include:  Doing resistance training twice each week, such as:  Push-ups.  Sit-ups.  Lifting weights.  Using resistance bands.  Doing a given intensity of exercise for a given amount of time. Choose from these options:  150 minutes of moderate-intensity exercise every week.  75 minutes of vigorous-intensity exercise every week.  A mix of moderate-intensity and vigorous-intensity exercise every week. Children, pregnant women, people who are out of shape, people who are overweight, and older adults may need to consult a health care provider for individual recommendations. If you have any sort of medical condition, be sure to consult your health care provider before starting a new exercise program. WHAT ARE  SOME ACTIVITIES THAT CAN HELP ME TO LOSE WEIGHT?   Walking at a rate of at least 4.5 miles an hour.  Jogging or running at a rate of 5 miles per hour.  Biking at a rate of at least 10 miles per hour.  Lap swimming.  Roller-skating or in-line skating.  Cross-country skiing.  Vigorous competitive sports, such as football, basketball, and soccer.  Jumping rope.  Aerobic dancing. HOW CAN I BE MORE ACTIVE IN MY DAY-TO-DAY ACTIVITIES?  Use the stairs instead of the elevator.  Take a walk during your lunch break.  If you drive, park your car farther away from work or school.  If you take public transportation, get off one stop early and walk the rest of the way.  Make all of your phone calls while standing up and walking around.  Get up, stretch, and walk around every 30 minutes throughout the day. WHAT GUIDELINES SHOULD I FOLLOW WHILE EXERCISING?  Do not exercise so much that you hurt yourself, feel dizzy, or get very short of breath.  Consult your health care provider prior to starting a new exercise program.  Wear comfortable clothes and shoes with good support.  Drink plenty of water while you exercise to prevent dehydration or heat stroke. Body water is lost during exercise and must be replaced.  Work out until you breathe faster and your heart beats faster.   This information is not intended to replace advice given to you by your health care provider. Make sure you discuss any questions you have with your health care provider.  Document Released: 12/29/2010 Document Revised: 12/17/2014 Document Reviewed: 04/29/2014 Elsevier Interactive Patient Education [employer].

## 2016-06-05 NOTE — Assessment & Plan Note (Signed)
Cont meds, well controlled

## 2016-06-05 NOTE — Progress Notes (Signed)
Marjory Sneddon, D.O. Primary care at [facility]  Subjective:    CC: New pt, here to establish care.   HPI: Julia Zuniga is a pleasant 55 y.o. female who presents to [place] at [facility] today   Wt: 252--> 233 in ten wks by change in her diet- cutting carbs/ processed foods and high fats.  Wants to discuss in details how she can lose wt  CHol- recently changed the way she ate and came done on the dose of her chol med early June  Breast CA- dx Dec 2013- sent to Cards in early 2014 for clearance to go on chemo drugs- put on chol and HTN meds at that time.    Fe insfusion- 3 wks ago- has been a problem ever since her chemo was done/ finihsed in early  2105.  Onc Doc [employer] at [place])    GERD Sx- for yrs now-  Ca docs put it up to 40mg  and she could not tol going down to 20mg -.    Father- age 31 died stomach ca.   Had endoscopy with GI  Around March 2012 or so with Doc outside network.  Asked her to get me med records.   April 2015- colonsocpy w polyp--- repeat 5 yrs.       Past Medical History  Diagnosis Date  . Hyperlipidemia   . Diverticulitis     2012  . Heartburn   . Anxiety   . Hot flashes   . PONV (postoperative nausea and vomiting)   . Breast cancer ([address]) 12/08/12    Right  . History of radiation therapy 05/13/13-06/18/13    right breast, 5000 cGy 25 sessions  . Anemia   . 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 GERD (gastroesophageal reflux disease)   . Hypertension     Past Surgical History  Procedure Laterality Date  . Cesarean section      x 3  . Tubal ligation    . Tonsillectomy and adenoidectomy    . Cholecystectomy  2001    lap choli  . Breast lumpectomy with sentinel lymph node biopsy  12/31/2012    Procedure: BREAST LUMPECTOMY WITH SENTINEL LYMPH NODE BX;  Surgeon: Stark Klein, MD;  Location: [place];  Service: General;  Laterality: Right;  .  Portacath placement  12/31/2012    Procedure: INSERTION PORT-A-CATH;  Surgeon: Stark Klein, MD;  Location: [place];  Service: General;  Laterality: Left;  Left Subclavian Vein  . Re-excision of breast cancer,superior margins  01/14/2013    Procedure: RE-EXCISION OF BREAST CANCER,SUPERIOR MARGINS;  Surgeon: Stark Klein, MD;  Location: [facility];  Service: General;  Laterality: Right;  right breast re excision for markers   . Port-a-cath removal Left 02/24/2014    Procedure: REMOVAL PORT-A-CATH;  Surgeon: Stark Klein, MD;  Location: [place];  Service: General;  Laterality: Left;    Family History  Problem Relation Age of Onset  . Stomach cancer Father   . Lung cancer Maternal Uncle   . Skin cancer Maternal Grandmother   . Lung cancer Maternal Grandfather   . Lymphoma Cousin   . Colon cancer Neg Hx   . Esophageal cancer Neg Hx   . Rectal cancer Neg Hx     History  Drug Use No  ,  History  Alcohol Use  . 0.0 oz/week  .  0 Standard drinks or equivalent per week    Comment: occ  ,  History  Smoking status  . Never Smoker   Smokeless tobacco  . Never Used  ,  History  Sexual Activity  . Sexual Activity: Yes  . Birth Control/ Protection: Post-menopausal, Surgical    Comment: Tubal ligation 2000    Patient's Medications  New Prescriptions   No medications on file  Previous Medications   B COMPLEX VITAMINS TABLET    Take 1 tablet by mouth daily.   CARVEDILOL (COREG) 6.25 MG TABLET    TAKE ONE TABLET BY MOUTH TWICE DAILY WITH MEALS.   CHOLECALCIFEROL (D3 HIGH POTENCY) 1000 UNITS CAPSULE    Take 1,000 Units by mouth daily.   IBUPROFEN (ADVIL,MOTRIN) 200 MG TABLET    Take 400 mg by mouth every 6 (six) hours as needed. Pain   LORAZEPAM (ATIVAN) 0.5 MG TABLET    Take 1 tablet (0.5 mg total) by mouth 3 (three) times daily as needed for anxiety.   MULTIPLE VITAMINS-MINERALS (MULTIVITAMIN WITH MINERALS) TABLET    Take 1 tablet by mouth daily.    OMEPRAZOLE (PRILOSEC) 20 MG CAPSULE    Take 2 capsules (40 mg total) by mouth daily.   PROBIOTIC PRODUCT (PROBIOTIC DAILY PO)    Take 1 tablet by mouth daily.   ROSUVASTATIN (CRESTOR) 5 MG TABLET    Take 1 tablet (5 mg total) by mouth daily.   VITAMIN B-12 (CYANOCOBALAMIN) 100 MCG TABLET    Take 100 mcg by mouth daily.  Modified Medications   No medications on file  Discontinued Medications   No medications on file    ALLERGIES: Chlorhexidine; Codeine; Medralone; and Oxycodone-acetaminophen  Review of Systems:   ( Completed via her Adult Medical History Intake form today ) General:   Denies fever, chills, appetite changes, unexplained weight loss.  Optho/Auditory:   Denies visual changes, blurred vision/LOV, ringing in ears/ diff hearing Respiratory:   Denies SOB, DOE, cough, wheezing.  Cardiovascular:   Denies chest pain, palpitations, new onset peripheral edema  Gastrointestinal:   Denies nausea, vomiting, diarrhea.  Genitourinary:    Denies dysuria, increased frequency, flank pain.  Endocrine:     Denies hot or cold intolerance, polyuria, polydipsia. Musculoskeletal:  Denies unexplained myalgias, joint swelling, arthralgias, gait problems.  Skin:  Denies rash, suspicious lesions or new/ changes in moles Neurological:    Denies dizziness, syncope, unexplained weakness, lightheadedness, numbness  Psychiatric/Behavioral:   Denies mood changes, suicidal or homicidal ideations, hallucinations    Objective:   Blood pressure 132/84, pulse 65, height 5\' 6"  (1.676 m), weight 233 lb 8 oz (105.915 kg), last menstrual period 01/07/2009. Body mass index is 37.71 kg/(m^2).  General: Well Developed, well nourished, and in no acute distress.  Neuro: Alert and oriented x3, extra-ocular muscles intact, sensation grossly intact.  HEENT: Normocephalic, atraumatic, pupils equal round reactive to light, neck supple, no gross masses, no carotid bruits, no JVD apprec Skin: no gross suspicious lesions  or rashes  Cardiac: Regular rate and rhythm, no murmurs rubs or gallops.  Respiratory: Essentially clear to auscultation bilaterally. Not using accessory muscles, speaking in full sentences.  Abdominal: Soft, not grossly distended Musculoskeletal: Ambulates w/o diff, significant genu Valgus strain to knees b/l ,FROM * 4 ext.  Vasc: less 2 sec cap RF, warm and pink  Psych:  No HI/SI, judgement and insight good.    Impression and Recommendations:   Pt was seen in the office today for 45+ minutes, with over  50% time spent in face the face counseling and coordination of care  Extensive counseling done regarding weight loss.  Dietary and lifestyle modifications discussed in details.  Patient's desired goals: - Cont my fitness pal tracking of foods  - exercise at moderate intensity 48min /d for 2 wks, then inc by 64min each week as tolerated by your knees.  - 1 mo: wt loss goals=  8 pounds  - eat low gylcemic indexed carbs- I gave pt handouts and websites to go to for further information  The patient was counselled, risk factors were discussed, anticipatory guidance given.  Primary cancer of upper outer quadrant of right female breast [facility]) Discussed with patient importance of continuing to follow-up with oncology docs and all for appropriate screenings, etc.  Essential hypertension Controlled.  Continue carvedilol  Hyperlipidemia tol crestor well, obtain labs near future  Anxiety Well controlled  GERD (gastroesophageal reflux disease) Cont meds, well controlled  Gross side effects, risk and benefits, and alternatives of medications discussed with patient.  Patient is aware that all medications have potential side effects and we are unable to predict every sideeffect or drug-drug interaction that may occur.  Expresses verbal understanding and consents to current therapy plan and treatment regiment.  Note: This document was prepared using Dragon voice recognition software and may  include unintentional dictation errors.

## 2016-07-03 ENCOUNTER — Ambulatory Visit: Payer: BLUE CROSS/BLUE SHIELD | Admitting: Family Medicine

## 2016-07-05 ENCOUNTER — Ambulatory Visit (INDEPENDENT_AMBULATORY_CARE_PROVIDER_SITE_OTHER): Payer: BLUE CROSS/BLUE SHIELD

## 2016-07-05 ENCOUNTER — Ambulatory Visit: Payer: Self-pay

## 2016-07-05 ENCOUNTER — Encounter: Payer: Self-pay | Admitting: Podiatry

## 2016-07-05 ENCOUNTER — Ambulatory Visit (INDEPENDENT_AMBULATORY_CARE_PROVIDER_SITE_OTHER): Payer: BLUE CROSS/BLUE SHIELD | Admitting: Podiatry

## 2016-07-05 VITALS — BP 142/89 | HR 75 | Resp 16 | Ht 66.0 in | Wt 226.0 lb

## 2016-07-05 DIAGNOSIS — M722 Plantar fascial fibromatosis: Secondary | ICD-10-CM

## 2016-07-05 DIAGNOSIS — M79671 Pain in right foot: Secondary | ICD-10-CM

## 2016-07-05 DIAGNOSIS — M79673 Pain in unspecified foot: Secondary | ICD-10-CM | POA: Insufficient documentation

## 2016-07-05 DIAGNOSIS — M7662 Achilles tendinitis, left leg: Secondary | ICD-10-CM | POA: Diagnosis not present

## 2016-07-05 MED ORDER — TRIAMCINOLONE ACETONIDE 10 MG/ML IJ SUSP
10.0000 mg | Freq: Once | INTRAMUSCULAR | Status: AC
  Administered 2016-07-05: 10 mg

## 2016-07-05 NOTE — Progress Notes (Signed)
   Subjective:    Patient ID: Julia Zuniga, female    DOB: 16-Sep-1961, 55 y.o.   MRN: [id number]  HPI Chief Complaint  Patient presents with  . Foot Pain    Bilateral; soft nodules sides of heel; Left foot-nodule on back of heel; pt stated, "Left foot hurts worse than Right foot"      Review of Systems  All other systems reviewed and are negative.      Objective:   Physical Exam        Assessment & Plan:

## 2016-07-05 NOTE — Progress Notes (Signed)
Subjective:     Patient ID: Julia Zuniga, female   DOB: 09/24/1961, 55 y.o.   MRN: [id number]  HPI patient presents stating there is been a soft nodule on the back of the left Achilles or about a year that is not changed in size or color that's not painful but I do have Achilles tendinitis left that's been sore for the last several months   Review of Systems  All other systems reviewed and are negative.      Objective:   Physical Exam  Constitutional: She is oriented to person, place, and time.  Cardiovascular: Intact distal pulses.   Musculoskeletal: Normal range of motion.  Neurological: She is oriented to person, place, and time.  Skin: Skin is warm.  Nursing note and vitals reviewed.  neurovascular status intact muscle strength adequate range of motion within normal limits with patient noted to have a soft nodule on the posterior aspect of the left heel and noted to have discomfort in the medial side of the plantar fascia with fluid buildup. 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 At this point careful medial injection administered 3 mg dexamethasone Kenalog 5 mg Xylocaine to reduce inflammation after first advising her of the chances for rupture associated with this procedure. I then went ahead and I discussed the lesion and if it were to grow in size become painful or change color we'll need to be excised  X-ray doesn't indicate a small amount of calcification the posterior aspect of the left Achilles where the nodule is but it's localized and can be watched in the future with small posterior spurring noted bilateral

## 2016-07-26 ENCOUNTER — Ambulatory Visit: Payer: BLUE CROSS/BLUE SHIELD | Admitting: Podiatry

## 2016-09-11 ENCOUNTER — Other Ambulatory Visit: Payer: Self-pay | Admitting: Obstetrics and Gynecology

## 2016-09-11 DIAGNOSIS — Z1231 Encounter for screening mammogram for malignant neoplasm of breast: Secondary | ICD-10-CM

## 2016-09-12 ENCOUNTER — Other Ambulatory Visit: Payer: Self-pay | Admitting: Obstetrics and Gynecology

## 2016-09-12 DIAGNOSIS — Z853 Personal history of malignant neoplasm of breast: Secondary | ICD-10-CM

## 2016-11-12 ENCOUNTER — Other Ambulatory Visit: Payer: Self-pay | Admitting: Family Medicine

## 2016-11-12 DIAGNOSIS — E785 Hyperlipidemia, unspecified: Secondary | ICD-10-CM

## 2016-11-12 DIAGNOSIS — I10 Essential (primary) hypertension: Secondary | ICD-10-CM

## 2016-11-14 ENCOUNTER — Encounter: Payer: Self-pay | Admitting: Family Medicine

## 2016-11-14 ENCOUNTER — Ambulatory Visit (INDEPENDENT_AMBULATORY_CARE_PROVIDER_SITE_OTHER): Payer: BLUE CROSS/BLUE SHIELD | Admitting: Family Medicine

## 2016-11-14 ENCOUNTER — Other Ambulatory Visit: Payer: Self-pay

## 2016-11-14 VITALS — BP 136/81 | HR 73 | Temp 98.5°F | Ht 66.0 in | Wt 223.0 lb

## 2016-11-14 DIAGNOSIS — Z23 Encounter for immunization: Secondary | ICD-10-CM | POA: Diagnosis not present

## 2016-11-14 DIAGNOSIS — C50411 Malignant neoplasm of upper-outer quadrant of right female breast: Secondary | ICD-10-CM

## 2016-11-14 DIAGNOSIS — G629 Polyneuropathy, unspecified: Secondary | ICD-10-CM | POA: Insufficient documentation

## 2016-11-14 DIAGNOSIS — E559 Vitamin D deficiency, unspecified: Secondary | ICD-10-CM

## 2016-11-14 DIAGNOSIS — R5383 Other fatigue: Secondary | ICD-10-CM | POA: Diagnosis not present

## 2016-11-14 DIAGNOSIS — I10 Essential (primary) hypertension: Secondary | ICD-10-CM

## 2016-11-14 DIAGNOSIS — Z800 Family history of malignant neoplasm of digestive organs: Secondary | ICD-10-CM

## 2016-11-14 DIAGNOSIS — E539 Vitamin B deficiency, unspecified: Secondary | ICD-10-CM | POA: Diagnosis not present

## 2016-11-14 DIAGNOSIS — E669 Obesity, unspecified: Secondary | ICD-10-CM

## 2016-11-14 DIAGNOSIS — E785 Hyperlipidemia, unspecified: Secondary | ICD-10-CM | POA: Diagnosis not present

## 2016-11-14 DIAGNOSIS — IMO0001 Reserved for inherently not codable concepts without codable children: Secondary | ICD-10-CM

## 2016-11-14 DIAGNOSIS — K219 Gastro-esophageal reflux disease without esophagitis: Secondary | ICD-10-CM

## 2016-11-14 DIAGNOSIS — D509 Iron deficiency anemia, unspecified: Secondary | ICD-10-CM

## 2016-11-14 DIAGNOSIS — Z9221 Personal history of antineoplastic chemotherapy: Secondary | ICD-10-CM | POA: Insufficient documentation

## 2016-11-14 DIAGNOSIS — Z6838 Body mass index (BMI) 38.0-38.9, adult: Secondary | ICD-10-CM | POA: Insufficient documentation

## 2016-11-14 DIAGNOSIS — F419 Anxiety disorder, unspecified: Secondary | ICD-10-CM

## 2016-11-14 MED ORDER — CARVEDILOL 6.25 MG PO TABS: ORAL_TABLET | ORAL | 1 refills | Status: DC

## 2016-11-14 MED ORDER — ROSUVASTATIN CALCIUM 5 MG PO TABS: 5.0000 mg | ORAL_TABLET | Freq: Every day | ORAL | 1 refills | Status: DC

## 2016-11-14 MED ORDER — OMEPRAZOLE 20 MG PO CPDR: 40.0000 mg | DELAYED_RELEASE_CAPSULE | Freq: Every day | ORAL | 1 refills | Status: DC

## 2016-11-14 NOTE — Progress Notes (Signed)
Julia Zuniga, D.O. Primary care at [place]:    CC: 2nd OV with me---> last seen 06/05/16 for 1st time  HPI: Julia Zuniga is a pleasant 55 y.o. female who presents to [place] at [facility] today   Wt:  Class II Obesity  -  Hx: was 252lbs -->went down to 233 in ten wks by changes in her diet- cutting carbs/ processed foods and high fats.  - Today, she explains that after the new year, she wants to discuss in detail how she can lose wt further. Declines to go to [facility]   HTN: Patient has not taken her pressure medicines today because she normally eats and then takes them.     She is fasting for blood work today.     120's/ 70's at home- checks it once or twice wkly.   Asx.    CHol-came down on the dose of her chol med when checked in early June- had not had reck yet.   Breast CA- dx Dec 2013- sent to Cards in early 2014 for clearance to go on chemo drugs- put on chol and HTN meds at that time in Jan 2014.     Fe insfusion- Had problem with low Fe levels since Chemo finished in early 2015.  Had only 1 Fe infusion.  txed by her Onc Doc ( Dr. 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 Controlled.    GERD Sx- for yrs now-  Her ONC docs put it up to 40mg  and she could not tol going down to 20mg .  Sx well controlled today.     Fam HX stomach CA:  Father- age 35 died stomach ca.   Pt had endoscopy with GI approx. March 2012 or so with a Doc outside network.  Asked her to get me med records once again.    April 2015- colonoscopy w polyp--- repeat 5 yrs.       Past Medical History:  Diagnosis Date  . Anemia   . Anxiety   . Breast cancer ([place]) 12/08/12   Right  . Diverticulitis    2012  . GERD (gastroesophageal reflux disease)   . Heartburn   . 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 History of radiation therapy 05/13/13-06/18/13   right breast, 5000 cGy 25  sessions  . Hot flashes   . Hyperlipidemia   . Hypertension   . PONV (postoperative nausea and vomiting)     Past Surgical History:  Procedure Laterality Date  . BREAST LUMPECTOMY WITH SENTINEL LYMPH NODE BIOPSY  12/31/2012   Procedure: BREAST LUMPECTOMY WITH SENTINEL LYMPH NODE BX;  Surgeon: Stark Klein, MD;  Location: [place];  Service: General;  Laterality: Right;  . CESAREAN SECTION     x 3  . CHOLECYSTECTOMY  2001   lap choli  . PORT-A-CATH REMOVAL Left 02/24/2014   Procedure: REMOVAL PORT-A-CATH;  Surgeon: Stark Klein, MD;  Location: [place];  Service: General;  Laterality: Left;  . PORTACATH PLACEMENT  12/31/2012   Procedure: INSERTION PORT-A-CATH;  Surgeon: Stark Klein, MD;  Location: [place];  Service: General;  Laterality: Left;  Left Subclavian Vein  . RE-EXCISION OF BREAST CANCER,SUPERIOR MARGINS  01/14/2013   Procedure: RE-EXCISION OF BREAST CANCER,SUPERIOR MARGINS;  Surgeon: Stark Klein, MD;  [facility];  Service: General;  Laterality: Right;  right breast re excision for markers   . TONSILLECTOMY AND ADENOIDECTOMY    . TUBAL LIGATION      Family History  Problem Relation Age of Onset  . Stomach cancer Father   . Lung cancer Maternal Uncle   . Skin cancer Maternal Grandmother   . Lung cancer Maternal Grandfather   . Lymphoma Cousin   . Colon cancer Neg Hx   . Esophageal cancer Neg Hx   . Rectal cancer Neg Hx     History  Drug Use No  ,  History  Alcohol Use  . 0.0 oz/week    Comment: occ  ,  History  Smoking Status  . Never Smoker  Smokeless Tobacco  . Never Used  ,  History  Sexual Activity  . Sexual activity: Yes  . Birth control/ protection: Post-menopausal, Surgical    Comment: Tubal ligation 2000    Patient's Medications  New Prescriptions   No medications on file  Previous Medications   B COMPLEX VITAMINS TABLET    Take 1 tablet by mouth daily.   CHOLECALCIFEROL (D3 HIGH  POTENCY) 1000 UNITS CAPSULE    Take 1,000 Units by mouth daily.   IBUPROFEN (ADVIL,MOTRIN) 200 MG TABLET    Take 400 mg by mouth every 6 (six) hours as needed. Pain   LORAZEPAM (ATIVAN) 0.5 MG TABLET    Take 1 tablet (0.5 mg total) by mouth 3 (three) times daily as needed for anxiety.   MULTIPLE VITAMINS-MINERALS (MULTIVITAMIN WITH MINERALS) TABLET    Take 1 tablet by mouth daily.   PROBIOTIC PRODUCT (PROBIOTIC DAILY PO)    Take 1 tablet by mouth daily.   VITAMIN B-12 (CYANOCOBALAMIN) 100 MCG TABLET    Take 100 mcg by mouth daily.  Modified Medications   Modified Medication Previous Medication   CARVEDILOL (COREG) 6.25 MG TABLET carvedilol (COREG) 6.25 MG tablet      TAKE ONE TABLET BY MOUTH TWICE DAILY WITH MEALS.    TAKE ONE TABLET BY MOUTH TWICE DAILY WITH MEALS.   OMEPRAZOLE (PRILOSEC) 20 MG CAPSULE omeprazole (PRILOSEC) 20 MG capsule      Take 2 capsules (40 mg total) by mouth daily.    Take 2 capsules (40 mg total) by mouth daily.   ROSUVASTATIN (CRESTOR) 5 MG TABLET rosuvastatin (CRESTOR) 5 MG tablet      Take 1 tablet (5 mg total) by mouth daily.    Take 1 tablet (5 mg total) by mouth daily.  Discontinued Medications   No medications on file     ALLERGIES: Chlorhexidine; Codeine; Medralone [methylprednisolone acetate]; and Oxycodone-acetaminophen    Review of Systems:   ( Completed via her Adult Medical History Intake form today ) General:   Denies fever, chills, appetite changes, unexplained weight loss.  Optho/Auditory:   Denies visual changes, blurred vision/LOV, ringing in ears/ diff hearing Respiratory:   Denies SOB, DOE, cough, wheezing.  Cardiovascular:   Denies chest pain, palpitations, new onset peripheral edema  Gastrointestinal:   Denies nausea, vomiting, diarrhea.  Genitourinary:    Denies dysuria, increased frequency, flank pain.  Endocrine:     Denies hot or cold intolerance, polyuria, polydipsia. Musculoskeletal:  Denies unexplained myalgias, joint swelling,  arthralgias, gait problems.  Skin:  Denies rash, suspicious lesions or new/ changes in moles Neurological:    Denies dizziness, syncope, unexplained weakness, lightheadedness, numbness  Psychiatric/Behavioral:   Denies mood changes, suicidal or homicidal ideations, hallucinations    Objective:   Blood pressure 136/81, pulse  73, temperature 98.5 F (36.9 C), height 5\' 6"  (1.676 m), weight 223 lb (101.2 kg), last menstrual period 01/07/2009.  Body mass index is 35.99 kg/m.  General: Well Developed, well nourished, and in no acute distress.  Neuro: Alert and oriented x3, extra-ocular muscles intact, sensation grossly intact.  HEENT: Normocephalic, atraumatic, pupils equal round reactive to light, neck supple, no gross masses, no carotid bruits, no JVD apprec Skin: no gross suspicious lesions or rashes  Cardiac: Regular rate and rhythm, no murmurs rubs or gallops.  Respiratory: Essentially clear to auscultation bilaterally. Not using accessory muscles, speaking in full sentences.  Abdominal: Soft, not grossly distended Musculoskeletal: Ambulates w/o diff, significant genu Valgus strain to knees b/l , FROM * 4 ext.  Vasc: less 2 sec cap RF, warm and pink  Psych:  No HI/SI, judgement and insight good.    Impression and Recommendations:   Pt was seen in the office today for 45+ minutes, with over 50% time spent in face the face counseling and coordination of care  Extensive counseling done regarding weight loss.  Dietary and lifestyle modifications discussed in details.  The patient was counselled, risk factors were discussed, anticipatory guidance given.  1) GERD (gastroesophageal reflux disease) Has had Sx for yrs now-  Her ONC docs put it up to 40mg  after chemo and she could not tol going down to 20mg .  Sx well controlled today.     2) Anxiety Takes Ativan seldomly (prn) from her [place] doc. Obtain TSH today   3) Essential hypertension-  dx by Cards Jan '14 Essentially controlled.     -Lifestyle changes such as dash diet and engaging in a regular exercise program discussed with patient.  Educational handouts provided  Ambulatory BP monitoring encouraged. Keep log and bring in next OV.   GOAL BP--->   < 130/80  -  Continue carvedilol.  - obtain labs today   4) Hyperlipidemia- dx by Cards Jan '14 tol crestor well, obtain labs today- FLP and CMP   5) S/P chemotherapy, time since greater than 12 weeks obatin b12, Vit D, TSH, CMP , CBC today   6) Primary cancer of upper-outer quadrant R breast Discussed with patient importance of continuing to follow-up with oncology docs for all for appropriate screenings, check-ups etc.   7) Obesity, Class II, BMI 35-39.9, with comorbidity - Advised wt loss.  - Extensive counseling done and handouts provided   If interested in actively managing weight:  Use lose it or my fitness pal---> track all food and drinks.   Discussed with patient importance of weight loss to help achieve health goals and how increasing weight, correlates to increasing risk of various diseases. (or increasing risk of not controlling existing diseases.)  F/up sooner than planned if you would like to further discuss strategies to lose weight  - Weight watchers recommended- pt declined   Patient's desired goals: - Cont my fitness pal tracking of foods  - exercise at moderate intensity 42min /d for 2 wks, then inc by 79min each week as tolerated by your knees.  - 1 mo: wt loss goals=  8 pounds  - eat low gylcemic indexed carbs- I gave pt handouts and websites to go to for further information   8)  History of Iron deficiency anemia- 2nd chemo induced Fe Def CBC today   Gross side effects, risk and benefits, and alternatives of medications discussed with patient.  Patient is aware that all medications have potential side effects and we are unable  to predict every sideeffect or drug-drug interaction that may occur.  Expresses verbal understanding  and consents to current therapy plan and treatment regiment.  Note: This document was prepared using Dragon voice recognition software and may include unintentional dictation errors.

## 2016-11-14 NOTE — Patient Instructions (Addendum)
Patient's desired goals for next OV:  - Cont my fitness pal tracking of foods  - exercise at moderate intensity 60min /d for 2 wks, then inc by 35min each week as tolerated by your knees.  - 1 mo: wt loss goals=  8 pounds  - eat low gylcemic indexed carbs- I gave pt handouts and websites to go to for further information    Guidelines for Losing Weight   We want weight loss that will last so you should lose 1-2 pounds a week.  THAT IS IT! Please pick THREE things a month to change. Once it is a habit check off the item. Then pick another three items off the list to become habits.  If you are already doing a habit on the list GREAT!  Cross that item off!  Don't drink your calories. Ie, alcohol, soda, fruit juice, and sweet tea.   Drink more water. Drink a glass when you feel hungry or before each meal.   Eat breakfast - Complex carb and protein (likeDannon light and fit yogurt, oatmeal, fruit, eggs, [place] bacon).  Measure your cereal.  Eat no more than one cup a day. (ie Kashi)  Eat an apple a day.  Add a vegetable a day.  Try a new vegetable a month.  Use Pam! Stop using oil or butter to cook.  Don't finish your plate or use smaller plates.  Share your dessert.  Eat sugar free Jello for dessert or frozen grapes.  Don't eat 2-3 hours before bed.  Switch to whole wheat bread, pasta, and brown rice.  Make healthier choices when you eat out. No fries!  Pick baked chicken, NOT fried.  Don't forget to SLOW DOWN when you eat. It is not going anywhere.   Take the stairs.  Park far away in the parking lot  Lift soup cans (or weights) for 10 minutes while watching TV.  Walk at work for 10 minutes during break.  Walk outside 1 time a week with your friend, kids, dog, or significant other.  Start a walking group at church.  Walk the mall as much as you can tolerate.   Keep a food diary.  Weigh yourself daily.  Walk for 15 minutes 3 days per week.  Cook at home  more often and eat out less. If life happens and you go back to old habits, it is okay.  Just start over. You can do it!  If you experience chest pain, get short of breath, or tired during the exercise, please stop immediately and inform your doctor.    Before you even begin to attack a weight-loss plan, it pays to remember this: You are not fat. You have fat. Losing weight isn't about blame or shame; it's simply another achievement to accomplish. Dieting is like any other skill-you have to buckle down and work at it. As long as you act in a smart, reasonable way, you'll ultimately get where you want to be. Here are some weight loss pearls for you.   1. It's Not a Diet. It's a Lifestyle Thinking of a diet as something you're on and suffering through only for the short term doesn't work. To shed weight and keep it off, you need to make permanent changes to the way you eat. It's OK to indulge occasionally, of course, but if you cut calories temporarily and then revert to your old way of eating, you'll gain back the weight quicker than you can say yo-yo. Use it to lose  it. Research shows that one of the best predictors of long-term weight loss is how many pounds you drop in the first month. For that reason, nutritionists often suggest being stricter for the first two weeks of your new eating strategy to build momentum. Cut out added sugar and alcohol and avoid unrefined carbs. After that, figure out how you can reincorporate them in a way that's healthy and maintainable.  2. There's a Right Way to Exercise Working out burns calories and fat and boosts your metabolism by building muscle. But those trying to lose weight are notorious for overestimating the number of calories they burn and underestimating the amount they take in. Unfortunately, your system is biologically programmed to hold on to extra pounds and that means when you start exercising, your body senses the deficit and ramps up its hunger  signals. If you're not diligent, you'll eat everything you burn and then some. Use it, to lose it. Cardio gets all the exercise glory, but strength and interval training are the real heroes. They help you build lean muscle, which in turn increases your metabolism and calorie-burning ability 3. Don't Overreact to Mild Hunger Some people have a hard time losing weight because of hunger anxiety. To them, being hungry is bad-something to be avoided at all costs-so they carry snacks with them and eat when they don't need to. Others eat because they're stressed out or bored. While you never want to get to the point of being ravenous (that's when bingeing is likely to happen), a hunger pang, a craving, or the fact that it's 3:00 p.m. should not send you racing for the vending machine or obsessing about the energy bar in your purse. Ideally, you should put off eating until your stomach is growling and it's difficult to concentrate.  Use it to lose it. When you feel the urge to eat, use the HALT method. Ask yourself, Am I really hungry? Or am I angry or anxious, lonely or bored, or tired? If you're still not certain, try the apple test. If you're truly hungry, an apple should seem delicious; if it doesn't, something else is going on. Or you can try drinking water and making yourself busy, if you are still hungry try a healthy snack.  4. Not All Calories Are Created Equal The mechanics of weight loss are pretty simple: Take in fewer calories than you use for energy. But the kind of food you eat makes all the difference. Processed food that's high in saturated fat and refined starch or sugar can cause inflammation that disrupts the hormone signals that tell your brain you're full. The result: You eat a lot more.  Use it to lose it. Clean up your diet. Swap in whole, unprocessed foods, including vegetables, lean protein, and healthy fats that will fill you up and give you the biggest nutritional bang for your calorie  buck. In a few weeks, as your brain starts receiving regular hunger and fullness signals once again, you'll notice that you feel less hungry overall and naturally start cutting back on the amount you eat.  5. Protein, Produce, and Plant-Based Fats Are Your Weight-Loss Trinity Here's why eating the three Ps regularly will help you drop pounds. Protein fills you up. You need it to build lean muscle, which keeps your metabolism humming so that you can torch more fat. People in a weight-loss program who ate double the recommended daily allowance for protein (about 110 grams for a 150-pound woman) lost 70 percent of their  weight from fat, while people who ate the RDA lost only about 40 percent, one study found. Produce is packed with filling fiber. "It's very difficult to consume too many calories if you're eating a lot of vegetables. Example: Three cups of broccoli is a lot of food, yet only 93 calories. (Fruit is another story. It can be easy to overeat and can contain a lot of calories from sugar, so be sure to monitor your intake.) Plant-based fats like olive oil and those in avocados and nuts are healthy and extra satiating.  Use it to lose it. Aim to incorporate each of the three Ps into every meal and snack. People who eat protein throughout the day are able to keep weight off, according to a study in the [place] of Clinical Nutrition. In addition to meat, poultry and seafood, good sources are beans, lentils, eggs, tofu, and yogurt. As for fat, keep portion sizes in check by measuring out salad dressing, oil, and nut butters (shoot for one to two tablespoons). Finally, eat veggies or a little fruit at every meal. People who did that consumed 308 fewer calories but didn't feel any hungrier than when they didn't eat more produce.  7. How You Eat Is As Important As What You Eat In order for your brain to register that you're full, you need to focus on what you're eating. Sit down whenever you eat,  preferably at a table. Turn off the TV or computer, put down your phone, and look at your food. Smell it. Chew slowly, and don't put another bite on your fork until you swallow. When women ate lunch this attentively, they consumed 30 percent less when snacking later than those who listened to an audiobook at lunchtime, according to a study in the [place] of Nutrition. 8. Weighing Yourself Really Works The scale provides the best evidence about whether your efforts are paying off. Seeing the numbers tick up or down or stagnate is motivation to keep going-or to rethink your approach. A 2015 study at [facility] found that daily weigh-ins helped people lose more weight, keep it off, and maintain that loss, even after two years. Use it to lose it. Step on the scale at the same time every day for the best results. If your weight shoots up several pounds from one weigh-in to the next, don't freak out. Eating a lot of salt the night before or having your period is the likely culprit. The number should return to normal in a day or two. It's a steady climb that you need to do something about. 9. Too Much Stress and Too Little Sleep Are Your Enemies When you're tired and frazzled, your body cranks up the production of cortisol, the stress hormone that can cause carb cravings. Not getting enough sleep also boosts your levels of ghrelin, a hormone associated with hunger, while suppressing leptin, a hormone that signals fullness and satiety. People on a diet who slept only five and a half hours a night for two weeks lost 55 percent less fat and were hungrier than those who slept eight and a half hours, according to a study in the [place]. Use it to lose it. Prioritize sleep, aiming for seven hours or more a night, which research shows helps lower stress. And make sure you're getting quality zzz's. If a snoring spouse or a fidgety cat wakes you up frequently throughout the night,  you may end up getting the equivalent of just four hours  of sleep, according to a study from [facility]. Keep pets out of the bedroom, and use a white-noise app to drown out snoring. 10. You Will Hit a plateau-And You Can Bust Through It As you slim down, your body releases much less leptin, the fullness hormone.  If you're not strength training, start right now. Building muscle can raise your metabolism to help you overcome a plateau. To keep your body challenged and burning calories, incorporate new moves and more intense intervals into your workouts or add another sweat session to your weekly routine. Alternatively, cut an extra 100 calories or so a day from your diet. Now that you've lost weight, your body simply doesn't need as much fuel.      Since food equals calories, in order to lose weight you must either eat fewer calories, exercise more to burn off calories with activity, or both. Food that is not used to fuel the body is stored as fat. A major component of losing weight is to make smarter food choices. Here's how:  1)   Limit non-nutritious foods, such as: Sugar, honey, syrups and candy Pastries, donuts, pies, cakes and cookies Soft drinks, sweetened juices and alcoholic beverages  2)  Cut down on high-fat foods by: - Choosing poultry, fish or lean red meat - Choosing low-fat cooking methods, such as baking, broiling, steaming, grilling and boiling - Using low-fat or non-fat dairy products - Using vinaigrette, herbs, lemon or fat-free salad dressings - Avoiding fatty meats, such as bacon, sausage, franks, ribs and luncheon meats - Avoiding high-fat snacks like nuts, chips and chocolate - Avoiding fried foods - Using less butter, margarine, oil and mayonnaise - Avoiding high-fat gravies, cream sauces and cream-based soups  3) Eat a variety of foods, including: - Fruit and vegetables that are raw, steamed or baked - Whole grains, breads, cereal, rice and pasta -  Dairy products, such as low-fat or non-fat milk or yogurt, low-fat cottage cheese and low-fat cheese - Protein-rich foods like chicken, [place], fish, lean meat and legumes, or beans  4) Change your eating habits by: - Eat three balanced meals a day to help control your hunger - Watch portion sizes and eat small servings of a variety of foods - Choose low-calorie snacks - Eat only when you are hungry and stop when you are satisfied - Eat slowly and try not to perform other tasks while eating - Find other activities to distract you from food, such as walking, taking up a hobby or being involved in the community - Include regular exercise in your daily routine ( minimum of 20 min of moderate-intensity exercise at least 5 days/week)  - Find a support group, if necessary, for emotional support in your weight loss journey      Easy ways to cut 100 calories  1. Eat your eggs with hot sauce OR salsa instead of cheese.  Eggs are great for breakfast, but many people consider eggs and cheese to be BFFs. Instead of cheese-1 oz. of cheddar has 114 calories-top your eggs with hot sauce, which contains no calories and helps with satiety and metabolism. Salsa is also a great option!!  2. Top your toast, waffles or pancakes with fresh berries instead of jelly or syrup. Half a cup of berries-fresh, frozen or thawed-has about 40 calories, compared with 2 tbsp. of maple syrup or jelly, which both have about 100 calories. The berries will also give you a good punch of fiber, which helps keep you full  and satisfied and won't spike blood sugar quickly like the jelly or syrup. 3. Swap the non-fat latte for black coffee with a splash of half-and-half. Contrary to its name, that non-fat latte has 130 calories and a startling 19g of carbohydrates per 16 oz. serving. Replacing that 'light' drinkable dessert with a black coffee with a splash of half-and-half saves you more than 100 calories per 16 oz. serving. 4.  Sprinkle salads with freeze-dried raspberries instead of dried cranberries. If you want a sweet addition to your nutritious salad, stay away from dried cranberries. They have a whopping 130 calories per  cup and 30g carbohydrates. Instead, sprinkle freeze-dried raspberries guilt-free and save more than 100 calories per  cup serving, adding 3g of belly-filling fiber. 5. Go for mustard in place of mayo on your sandwich. Mustard can add really nice flavor to any sandwich, and there are tons of varieties, from spicy to honey. A serving of mayo is 95 calories, versus 10 calories in a serving of mustard.  Or try an avocado mayo spread: You can find the recipe few click this link: https://www.californiaavocado.com/recipes/recipe-container/[place]-avocado-mayo 6. Choose a DIY salad dressing instead of the store-bought kind. Mix Dijon or whole grain mustard with low-fat Kefir or red wine vinegar and garlic. 7. Use hummus as a spread instead of a dip. Use hummus as a spread on a high-fiber cracker or tortilla with a sandwich and save on calories without sacrificing taste. 8. Pick just one salad "accessory." Salad isn't automatically a calorie winner. It's easy to over-accessorize with toppings. Instead of topping your salad with nuts, avocado and cranberries (all three will clock in at 313 calories), just pick one. The next day, choose a different accessory, which will also keep your salad interesting. You don't wear all your jewelry every day, right? 9. Ditch the white pasta in favor of spaghetti squash. One cup of cooked spaghetti squash has about 40 calories, compared with traditional spaghetti, which comes with more than 200. Spaghetti squash is also nutrient-dense. It's a good source of fiber and Vitamins A and C, and it can be eaten just like you would eat pasta-with a great tomato sauce and [place] meatballs or with pesto, tofu and spinach, for example. 10. Dress up your chili, soups and stews with  non-fat [place] yogurt instead of sour cream. Just a 'dollop' of sour cream can set you back 115 calories and a whopping 12g of fat-seven of which are of the artery-clogging variety. Added bonus: [place] yogurt is packed with muscle-building protein, calcium and B Vitamins. 11. Mash cauliflower instead of mashed potatoes. One cup of traditional mashed potatoes-in all their creamy goodness-has more than 200 calories, compared to mashed cauliflower, which you can typically eat for less than 100 calories per 1 cup serving. Cauliflower is a great source of the antioxidant indole-3-carbinol (I3C), which may help reduce the risk of some cancers, like breast cancer. 12. Ditch the ice cream sundae in favor of a [place] yogurt parfait. Instead of a cup of ice cream or fro-yo for dessert, try 1 cup of nonfat Greek yogurt topped with fresh berries and a sprinkle of cacao nibs. Both toppings are packed with antioxidants, which can help reduce cellular inflammation and oxidative damage. And the comparison is a no-brainer: One cup of ice cream has about 275 calories; one cup of frozen yogurt has about 230; and a cup of Greek yogurt has just 130, plus twice the protein, so you're less likely to return to the freezer for a  second helping. 13. Put olive oil in a spray container instead of using it directly from the bottle. Each tablespoon of olive oil is 120 calories and 15g of fat. Use a mister instead of pouring it straight into the pan or onto a salad. This allows for portion control and will save you more than 100 calories. 14. When baking, substitute canned pumpkin for butter or oil. Canned pumpkin-not pumpkin pie mix-is loaded with Vitamin A, which is important for skin and eye health, as well as immunity. And the comparisons are pretty crazy:  cup of canned pumpkin has about 40 calories, compared to butter or oil, which has more than 800 calories. Yes, 800 calories. Applesauce and mashed banana can also serve as good  substitutions for butter or oil, usually in a 1:1 ratio. 15. Top casseroles with high-fiber cereal instead of breadcrumbs. Breadcrumbs are typically made with white bread, while breakfast cereals contain 5-9g of fiber per serving. Not only will you save more than 150 calories per  cup serving, the swap will also keep you more full and you'll get a metabolism boost from the added fiber. 16. Snack on pistachios instead of macadamia nuts. Believe it or not, you get the same amount of calories from 35 pistachios (100 calories) as you would from only five macadamia nuts. 17. Chow down on kale chips rather than potato chips. This is my favorite 'don't knock it 'till you try it' swap. Kale chips are so easy to make at home, and you can spice them up with a little grated parmesan or chili powder. Plus, they're a mere fraction of the calories of potato chips, but with the same crunch factor we crave so often. 18. Add seltzer and some fruit slices to your cocktail instead of soda or fruit juice. One cup of soda or fruit juice can pack on as much as 140 calories. Instead, use seltzer and fruit slices. The fruit provides valuable phytochemicals, such as flavonoids and anthocyanins, which help to combat cancer and stave off the aging process.        Please realize, EXERCISE IS MEDICINE!  -  American Heart Association [facility]) guidelines for exercise : If you are in good health, without any medical conditions, you should engage in 150 minutes of moderate intensity aerobic activity per week.  This means you should be huffing and puffing throughout your workout.   Engaging in regular exercise will improve brain function and memory, as well as improve mood, boost immune system and help with weight management.  As well as the other, more well-known effects of exercise such as decreasing blood sugar levels, decreasing blood pressure,  and decreasing bad cholesterol levels/ increasing good cholesterol levels.     -   The AHA strongly endorses consumption of a diet that contains a variety of foods from all the food categories with an emphasis on fruits and vegetables; fat-free and low-fat dairy products; cereal and grain products; legumes and nuts; and fish, poultry, and/or extra lean meats.    Excessive food intake, especially of foods high in saturated and trans fats, sugar, and salt, should be avoided.    Adequate water intake of roughly 1/2 of your weight in pounds, should equal the ounces of water per day you should drink.  So for instance, if you're 200 pounds, that would be 100 ounces of water per day.       Mediterranean Diet  Why follow it? Research shows. . Those who follow the [place]  have a reduced risk of heart disease  . The diet is associated with a reduced incidence of Parkinson's and Alzheimer's diseases . People following the diet may have longer life expectancies and lower rates of chronic diseases  . The Dietary Guidelines for Americans recommends the Mediterranean diet as an eating plan to promote health and prevent disease  What Is the Mediterranean Diet?  . Healthy eating plan based on typical foods and recipes of Mediterranean-style cooking . The diet is primarily a plant based diet; these foods should make up a majority of meals   Starches - Plant based foods should make up a majority of meals - They are an important sources of vitamins, minerals, energy, antioxidants, and fiber - Choose whole grains, foods high in fiber and minimally processed items  - Typical grain sources include wheat, oats, barley, corn, brown rice, bulgar, farro, millet, polenta, couscous  - Various types of beans include chickpeas, lentils, fava beans, black beans, white beans   Fruits  Veggies - Large quantities of antioxidant rich fruits & veggies; 6 or more servings  - Vegetables can be eaten raw or lightly drizzled with oil and cooked  - Vegetables common to the traditional Mediterranean Diet  include: artichokes, arugula, beets, broccoli, brussel sprouts, cabbage, carrots, celery, collard greens, cucumbers, eggplant, kale, leeks, lemons, lettuce, mushrooms, okra, onions, peas, peppers, potatoes, pumpkin, radishes, rutabaga, shallots, spinach, sweet potatoes, turnips, zucchini - Fruits common to the Mediterranean Diet include: apples, apricots, avocados, cherries, clementines, dates, figs, grapefruits, grapes, melons, nectarines, oranges, peaches, pears, pomegranates, strawberries, tangerines  Fats - Replace butter and margarine with healthy oils, such as olive oil, canola oil, and tahini  - Limit nuts to no more than a handful a day  - Nuts include walnuts, almonds, pecans, pistachios, pine nuts  - Limit or avoid candied, honey roasted or heavily salted nuts - Olives are central to the [employer] - can be eaten whole or used in a variety of dishes   Meats Protein - Limiting red meat: no more than a few times a month - When eating red meat: choose lean cuts and keep the portion to the size of deck of cards - Eggs: approx. 0 to 4 times a week  - Fish and lean poultry: at least 2 a week  - Healthy protein sources include, chicken, [place], lean beef, lamb - Increase intake of seafood such as tuna, salmon, trout, mackerel, shrimp, scallops - Avoid or limit high fat processed meats such as sausage and bacon  Dairy - Include moderate amounts of low fat dairy products  - Focus on healthy dairy such as fat free yogurt, skim milk, low or reduced fat cheese - Limit dairy products higher in fat such as whole or 2% milk, cheese, ice cream  Alcohol - Moderate amounts of red wine is ok  - No more than 5 oz daily for women (all ages) and men older than age 21  - No more than 10 oz of wine daily for men younger than 29  Other - Limit sweets and other desserts  - Use herbs and spices instead of salt to flavor foods  - Herbs and spices common to the traditional Mediterranean Diet include:  basil, bay leaves, chives, cloves, cumin, fennel, garlic, lavender, marjoram, mint, oregano, parsley, pepper, rosemary, sage, savory, sumac, tarragon, thyme   It's not just a diet, it's a lifestyle:  . The Mediterranean diet includes lifestyle factors typical of those in the region  . Foods,  drinks and meals are best eaten with others and savored . Daily physical activity is important for overall good health . This could be strenuous exercise like running and aerobics . This could also be more leisurely activities such as walking, housework, yard-work, or taking the stairs . Moderation is the key; a balanced and healthy diet accommodates most foods and drinks . Consider portion sizes and frequency of consumption of certain foods   Meal Ideas & Options:  . Breakfast:  o Whole wheat toast or whole wheat English muffins with peanut butter & hard boiled egg o Steel cut oats topped with apples & cinnamon and skim milk  o Fresh fruit: banana, strawberries, melon, berries, peaches  o Smoothies: strawberries, bananas, greek yogurt, peanut butter o Low fat greek yogurt with blueberries and granola  o Egg white omelet with spinach and mushrooms o Breakfast couscous: whole wheat couscous, apricots, skim milk, cranberries  . Sandwiches:  o Hummus and grilled vegetables (peppers, zucchini, squash) on whole wheat bread   o Grilled chicken on whole wheat pita with lettuce, tomatoes, cucumbers or tzatziki  o Tuna salad on whole wheat bread: tuna salad made with greek yogurt, olives, red peppers, capers, green onions o Garlic rosemary lamb pita: lamb sauted with garlic, rosemary, salt & pepper; add lettuce, cucumber, greek yogurt to pita - flavor with lemon juice and black pepper  . Seafood:  o Mediterranean grilled salmon, seasoned with garlic, basil, parsley, lemon juice and black pepper o Shrimp, lemon, and spinach whole-grain pasta salad made with low fat greek yogurt  o Seared scallops with lemon  orzo  o Seared tuna steaks seasoned salt, pepper, coriander topped with tomato mixture of olives, tomatoes, olive oil, minced garlic, parsley, green onions and cappers  . Meats:  o Herbed greek chicken salad with kalamata olives, cucumber, feta  o Red bell peppers stuffed with spinach, bulgur, lean ground beef (or lentils) & topped with feta   o Kebabs: skewers of chicken, tomatoes, onions, zucchini, squash  o [place] burgers: made with red onions, mint, dill, lemon juice, feta cheese topped with roasted red peppers . Vegetarian o Cucumber salad: cucumbers, artichoke hearts, celery, red onion, feta cheese, tossed in olive oil & lemon juice  o Hummus and whole grain pita points with a greek salad (lettuce, tomato, feta, olives, cucumbers, red onion) o Lentil soup with celery, carrots made with vegetable broth, garlic, salt and pepper  o Tabouli salad: parsley, bulgur, mint, scallions, cucumbers, tomato, radishes, lemon juice, olive oil, salt and pepper.

## 2016-11-15 LAB — COMPLETE METABOLIC PANEL WITH GFR
ALBUMIN: 4.7 g/dL (ref 3.6–5.1)
ALK PHOS: 88 U/L (ref 33–130)
ALT: 15 U/L (ref 6–29)
AST: 12 U/L (ref 10–35)
BUN: 9 mg/dL (ref 7–25)
CALCIUM: 10 mg/dL (ref 8.6–10.4)
CHLORIDE: 102 mmol/L (ref 98–110)
CO2: 28 mmol/L (ref 20–31)
Creat: 0.51 mg/dL (ref 0.50–1.05)
Glucose, Bld: 91 mg/dL (ref 65–99)
POTASSIUM: 5.2 mmol/L (ref 3.5–5.3)
Sodium: 140 mmol/L (ref 135–146)
Total Bilirubin: 0.4 mg/dL (ref 0.2–1.2)
Total Protein: 7.4 g/dL (ref 6.1–8.1)

## 2016-11-15 LAB — VITAMIN B12: Vitamin B-12: 1056 pg/mL (ref 200–1100)

## 2016-11-15 LAB — CBC WITH DIFFERENTIAL/PLATELET
BASOS PCT: 1 %
Basophils Absolute: 78 cells/uL (ref 0–200)
EOS PCT: 2 %
Eosinophils Absolute: 156 cells/uL (ref 15–500)
HCT: 40.4 % (ref 35.0–45.0)
HEMOGLOBIN: 12.8 g/dL (ref 11.7–15.5)
LYMPHS ABS: 2808 {cells}/uL (ref 850–3900)
Lymphocytes Relative: 36 %
MCH: 26.5 pg — ABNORMAL LOW (ref 27.0–33.0)
MCHC: 31.7 g/dL — AB (ref 32.0–36.0)
MCV: 83.6 fL (ref 80.0–100.0)
MONO ABS: 468 {cells}/uL (ref 200–950)
MPV: 10.2 fL (ref 7.5–12.5)
Monocytes Relative: 6 %
NEUTROS ABS: 4290 {cells}/uL (ref 1500–7800)
NEUTROS PCT: 55 %
Platelets: 262 10*3/uL (ref 140–400)
RBC: 4.83 MIL/uL (ref 3.80–5.10)
RDW: 13.5 % (ref 11.0–15.0)
WBC: 7.8 10*3/uL (ref 3.8–10.8)

## 2016-11-15 LAB — VITAMIN D 25 HYDROXY (VIT D DEFICIENCY, FRACTURES): Vit D, 25-Hydroxy: 60 ng/mL (ref 30–100)

## 2016-11-15 LAB — LIPID PANEL
CHOL/HDL RATIO: 3 ratio (ref <5.0)
CHOLESTEROL: 157 mg/dL (ref <200)
HDL: 53 mg/dL (ref >50)
LDL Cholesterol: 80 mg/dL (ref <100)
Triglycerides: 120 mg/dL (ref <150)
VLDL: 24 mg/dL (ref <30)

## 2016-11-15 LAB — TSH: TSH: 1.38 m[IU]/L

## 2016-11-29 ENCOUNTER — Ambulatory Visit
Admission: RE | Admit: 2016-11-29 | Discharge: 2016-11-29 | Disposition: A | Discharge status: Home / Self Care | Payer: BLUE CROSS/BLUE SHIELD | Source: Ambulatory Visit | Attending: Obstetrics and Gynecology | Admitting: Obstetrics and Gynecology

## 2016-11-29 DIAGNOSIS — R928 Other abnormal and inconclusive findings on diagnostic imaging of breast: Secondary | ICD-10-CM | POA: Diagnosis not present

## 2016-11-29 DIAGNOSIS — Z853 Personal history of malignant neoplasm of breast: Secondary | ICD-10-CM

## 2016-12-04 DIAGNOSIS — Z800 Family history of malignant neoplasm of digestive organs: Secondary | ICD-10-CM | POA: Insufficient documentation

## 2016-12-04 NOTE — Assessment & Plan Note (Addendum)
Takes Ativan seldomly (prn) from her [place] doc. Obtain TSH today

## 2016-12-04 NOTE — Assessment & Plan Note (Signed)
>>  ASSESSMENT AND PLAN FOR ANXIETY WRITTEN ON 12/04/2016 12:52 PM BY OPALSKI, DEBORAH, DO  Takes Ativan seldomly (prn) from her ONC doc. Obtain TSH today

## 2016-12-04 NOTE — Assessment & Plan Note (Signed)
tol crestor well, obtain labs today- FLP and CMP

## 2016-12-04 NOTE — Assessment & Plan Note (Addendum)
Essentially controlled.    -Lifestyle changes such as dash diet and engaging in a regular exercise program discussed with patient.  Educational handouts provided  Ambulatory BP monitoring encouraged. Keep log and bring in next OV.   GOAL BP--->   < 130/80  -  Continue carvedilol.  - obtain labs today

## 2016-12-04 NOTE — Assessment & Plan Note (Signed)
obatin b12, Vit D, TSH, CMP , CBC today

## 2016-12-04 NOTE — Assessment & Plan Note (Addendum)
-   Advised wt loss.  - Extensive counseling done and handouts provided   If interested in actively managing weight:  Use lose it or my fitness pal---> track all food and drinks.   Discussed with patient importance of weight loss to help achieve health goals and how increasing weight, correlates to increasing risk of various diseases. (or increasing risk of not controlling existing diseases.)  F/up sooner than planned if you would like to further discuss strategies to lose weight  - Weight watchers recommended- pt declined  Patient's desired goals: - Cont my fitness pal tracking of foods  - exercise at moderate intensity 3min /d for 2 wks, then inc by 69min each week as tolerated by your knees.  - 1 mo: wt loss goals=  8 pounds  - eat low gylcemic indexed carbs- I gave pt handouts and websites to go to for further information

## 2016-12-04 NOTE — Assessment & Plan Note (Signed)
CBC today.  

## 2016-12-04 NOTE — Assessment & Plan Note (Signed)
Discussed with patient importance of continuing to follow-up with oncology docs for all for appropriate screenings, check-ups etc.

## 2016-12-04 NOTE — Assessment & Plan Note (Signed)
Has had Sx for yrs now-  Her ONC docs put it up to 40mg  after chemo and she could not tol going down to 20mg .  Sx well controlled today.

## 2016-12-07 ENCOUNTER — Telehealth: Payer: Self-pay | Admitting: Adult Health

## 2016-12-07 NOTE — Telephone Encounter (Signed)
1/11 Appointment canceled per patient request. Patient does not intend to reschedule appointment at the moment.

## 2016-12-18 ENCOUNTER — Encounter: Payer: BLUE CROSS/BLUE SHIELD | Admitting: Nurse Practitioner

## 2016-12-18 ENCOUNTER — Other Ambulatory Visit: Payer: BLUE CROSS/BLUE SHIELD

## 2016-12-20 ENCOUNTER — Encounter: Payer: BLUE CROSS/BLUE SHIELD | Admitting: Adult Health

## 2016-12-20 ENCOUNTER — Other Ambulatory Visit: Payer: BLUE CROSS/BLUE SHIELD

## 2017-01-14 ENCOUNTER — Telehealth: Payer: BLUE CROSS/BLUE SHIELD | Admitting: Family

## 2017-01-14 DIAGNOSIS — B9689 Other specified bacterial agents as the cause of diseases classified elsewhere: Secondary | ICD-10-CM

## 2017-01-14 DIAGNOSIS — J028 Acute pharyngitis due to other specified organisms: Secondary | ICD-10-CM

## 2017-01-14 MED ORDER — ALBUTEROL SULFATE HFA 108 (90 BASE) MCG/ACT IN AERS: 1.0000 | INHALATION_SPRAY | RESPIRATORY_TRACT | 2 refills | Status: DC | PRN

## 2017-01-14 MED ORDER — AZITHROMYCIN 250 MG PO TABS: ORAL_TABLET | ORAL | 0 refills | Status: DC

## 2017-01-14 MED ORDER — BENZONATATE 100 MG PO CAPS: 100.0000 mg | ORAL_CAPSULE | Freq: Three times a day (TID) | ORAL | 0 refills | Status: DC | PRN

## 2017-01-14 NOTE — Progress Notes (Signed)

## 2017-01-14 NOTE — Addendum Note (Signed)
Addended by: Benjamine Mola on: 01/14/2017 09:59 AM   Modules accepted: Orders

## 2017-02-04 ENCOUNTER — Emergency Department
Admission: EM | Admit: 2017-02-04 | Discharge: 2017-02-04 | Discharge status: Absent without leave | Payer: BLUE CROSS/BLUE SHIELD | Source: Home / Self Care

## 2017-02-12 ENCOUNTER — Ambulatory Visit (HOSPITAL_COMMUNITY)
Admission: EM | Admit: 2017-02-12 | Discharge: 2017-02-12 | Disposition: A | Discharge status: Home / Self Care | Payer: BLUE CROSS/BLUE SHIELD | Attending: Internal Medicine | Admitting: Internal Medicine

## 2017-02-12 ENCOUNTER — Encounter (HOSPITAL_COMMUNITY): Payer: Self-pay | Admitting: Emergency Medicine

## 2017-02-12 ENCOUNTER — Ambulatory Visit (INDEPENDENT_AMBULATORY_CARE_PROVIDER_SITE_OTHER): Payer: BLUE CROSS/BLUE SHIELD

## 2017-02-12 DIAGNOSIS — Z807 Family history of other malignant neoplasms of lymphoid, hematopoietic and related tissues: Secondary | ICD-10-CM | POA: Insufficient documentation

## 2017-02-12 DIAGNOSIS — Z888 Allergy status to other drugs, medicaments and biological substances status: Secondary | ICD-10-CM | POA: Diagnosis not present

## 2017-02-12 DIAGNOSIS — R197 Diarrhea, unspecified: Secondary | ICD-10-CM | POA: Insufficient documentation

## 2017-02-12 DIAGNOSIS — Z9889 Other specified postprocedural states: Secondary | ICD-10-CM | POA: Diagnosis not present

## 2017-02-12 DIAGNOSIS — K219 Gastro-esophageal reflux disease without esophagitis: Secondary | ICD-10-CM | POA: Insufficient documentation

## 2017-02-12 DIAGNOSIS — Z853 Personal history of malignant neoplasm of breast: Secondary | ICD-10-CM | POA: Insufficient documentation

## 2017-02-12 DIAGNOSIS — Z79899 Other long term (current) drug therapy: Secondary | ICD-10-CM | POA: Diagnosis not present

## 2017-02-12 DIAGNOSIS — Z800 Family history of malignant neoplasm of digestive organs: Secondary | ICD-10-CM | POA: Diagnosis not present

## 2017-02-12 DIAGNOSIS — I10 Essential (primary) hypertension: Secondary | ICD-10-CM | POA: Diagnosis not present

## 2017-02-12 DIAGNOSIS — E785 Hyperlipidemia, unspecified: Secondary | ICD-10-CM | POA: Insufficient documentation

## 2017-02-12 DIAGNOSIS — Z808 Family history of malignant neoplasm of other organs or systems: Secondary | ICD-10-CM | POA: Insufficient documentation

## 2017-02-12 DIAGNOSIS — Z801 Family history of malignant neoplasm of trachea, bronchus and lung: Secondary | ICD-10-CM | POA: Insufficient documentation

## 2017-02-12 DIAGNOSIS — F419 Anxiety disorder, unspecified: Secondary | ICD-10-CM | POA: Diagnosis not present

## 2017-02-12 DIAGNOSIS — R109 Unspecified abdominal pain: Secondary | ICD-10-CM | POA: Diagnosis not present

## 2017-02-12 LAB — POCT I-STAT, CHEM 8
BUN: 9 mg/dL (ref 6–20)
Calcium, Ion: 1.21 mmol/L (ref 1.15–1.40)
Chloride: 102 mmol/L (ref 101–111)
Creatinine, Ser: 0.6 mg/dL (ref 0.44–1.00)
Glucose, Bld: 101 mg/dL — ABNORMAL HIGH (ref 65–99)
HCT: 40 % (ref 36.0–46.0)
HEMOGLOBIN: 13.6 g/dL (ref 12.0–15.0)
Potassium: 3.8 mmol/L (ref 3.5–5.1)
SODIUM: 143 mmol/L (ref 135–145)
TCO2: 28 mmol/L (ref 0–100)

## 2017-02-12 LAB — C DIFFICILE QUICK SCREEN W PCR REFLEX
C DIFFICILE (CDIFF) INTERP: NOT DETECTED
C DIFFICILE (CDIFF) TOXIN: NEGATIVE
C Diff antigen: NEGATIVE

## 2017-02-12 LAB — POCT URINALYSIS DIP (DEVICE)
BILIRUBIN URINE: NEGATIVE
GLUCOSE, UA: NEGATIVE mg/dL
Hgb urine dipstick: NEGATIVE
KETONES UR: NEGATIVE mg/dL
Nitrite: NEGATIVE
Protein, ur: NEGATIVE mg/dL
SPECIFIC GRAVITY, URINE: 1.025 (ref 1.005–1.030)
Urobilinogen, UA: 2 mg/dL — ABNORMAL HIGH (ref 0.0–1.0)
pH: 5.5 (ref 5.0–8.0)

## 2017-02-12 NOTE — ED Notes (Signed)
Janne Napoleon, np answered patient's questions as requested by patient

## 2017-02-12 NOTE — ED Triage Notes (Signed)
E-visit February 5, prescribed Z-PAC, developed diarrhea.  Patient thought to be related to antibiotic.  However, diarrhea has continued, every day.  Patient has nausea.  Patient has some dizziness.  Nausea is worse today

## 2017-02-12 NOTE — ED Notes (Signed)
Urine collected and small amount of stool collected, both in lab

## 2017-02-12 NOTE — ED Provider Notes (Signed)
CSN: [id number]     Arrival date & time 02/12/17  1146 History   First MD Initiated Contact with Patient 02/12/17 1308     Chief Complaint  Patient presents with  . Diarrhea   (Consider location/radiation/quality/duration/timing/severity/associated sxs/prior Treatment) 56 year old female states that the early part of February she was having upper respiratory symptoms and contacted the online E medicine for treatment. She was diagnosed with a URI and treated with Z-Pak, albuterol HFA and Tessalon Perles. Approximately 3-4 days after initiating the azithromycin she developed diarrhea which is occurred daily. She states that she has to go to the bathroom at least 6 if not more times in the day. She usually has to get up at night and has diarrhea. She describes the diarrhea is primarily watery stool. She is also complaining of pain across the upper abdomen. She has a history of indigestion, belching and GERD. This has not changed. Denies fever or chills. Denies seeing blood in the stool. Denies urinary symptoms.      Past Medical History:  Diagnosis Date  . Anemia   . Anxiety   . Breast cancer ([place]) 12/08/12   Right  . Diverticulitis    2012  . GERD (gastroesophageal reflux disease)   . Heartburn   . 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 History of radiation therapy 05/13/13-06/18/13   right breast, 5000 cGy 25 sessions  . Hot flashes   . Hyperlipidemia   . Hypertension   . PONV (postoperative nausea and vomiting)    Past Surgical History:  Procedure Laterality Date  . BREAST LUMPECTOMY WITH SENTINEL LYMPH NODE BIOPSY  12/31/2012   Procedure: BREAST LUMPECTOMY WITH SENTINEL LYMPH NODE BX;  Surgeon: Stark Klein, MD;  Location: [facility];  Service: General;  Laterality: Right;  . CESAREAN SECTION     x 3  . CHOLECYSTECTOMY  2001   lap choli  . PORT-A-CATH REMOVAL Left 02/24/2014   Procedure: REMOVAL PORT-A-CATH;  Surgeon: Stark Klein, MD;   Location: [facility];  Service: General;  Laterality: Left;  . PORTACATH PLACEMENT  12/31/2012   Procedure: INSERTION PORT-A-CATH;  Surgeon: Stark Klein, MD;  Location: [facility];  Service: General;  Laterality: Left;  Left Subclavian Vein  . RE-EXCISION OF BREAST CANCER,SUPERIOR MARGINS  01/14/2013   Procedure: RE-EXCISION OF BREAST CANCER,SUPERIOR MARGINS;  Surgeon: Stark Klein, MD;  Location: [facility];  Service: General;  Laterality: Right;  right breast re excision for markers   . TONSILLECTOMY AND ADENOIDECTOMY    . TUBAL LIGATION     Family History  Problem Relation Age of Onset  . Stomach cancer Father   . Lung cancer Maternal Uncle   . Skin cancer Maternal Grandmother   . Lung cancer Maternal Grandfather   . Lymphoma Cousin   . Colon cancer Neg Hx   . Esophageal cancer Neg Hx   . Rectal cancer Neg Hx    Social History  Substance Use Topics  . Smoking status: Never Smoker  . Smokeless tobacco: Never Used  . Alcohol use 0.0 oz/week     Comment: occ   OB History    No data available     Review of Systems  Constitutional: Positive for activity change. Negative for fever.  HENT: Negative.   Respiratory: Negative.   Cardiovascular: Negative.   Gastrointestinal: Positive for abdominal pain, diarrhea and nausea. Negative for vomiting.  Genitourinary: Negative.   Musculoskeletal: Negative.   Skin:  Negative.   Neurological: Positive for light-headedness.  All other systems reviewed and are negative.   Allergies  Chlorhexidine; Codeine; Medralone [methylprednisolone acetate]; and Oxycodone-acetaminophen  Home Medications   Prior to Admission medications   Medication Sig Start Date End Date Taking? Authorizing Provider  albuterol (PROVENTIL HFA;VENTOLIN HFA) 108 (90 Base) MCG/ACT inhaler Inhale 1-2 puffs into the lungs every 4 (four) hours as needed for wheezing or shortness of breath. 01/14/17   Benjamine Mola, FNP  b complex vitamins tablet  Take 1 tablet by mouth daily.    Historical Provider, MD  carvedilol (COREG) 6.25 MG tablet TAKE ONE TABLET BY MOUTH TWICE DAILY WITH MEALS. 11/14/16   Mellody Dance, DO  Cholecalciferol (D3 HIGH POTENCY) 1000 units capsule Take 1,000 Units by mouth daily.    Historical Provider, MD  ibuprofen (ADVIL,MOTRIN) 200 MG tablet Take 400 mg by mouth every 6 (six) hours as needed. Pain    Historical Provider, MD  LORazepam (ATIVAN) 0.5 MG tablet Take 1 tablet (0.5 mg total) by mouth 3 (three) times daily as needed for anxiety. 05/15/16   Elby Beck, FNP  Multiple Vitamins-Minerals (MULTIVITAMIN WITH MINERALS) tablet Take 1 tablet by mouth daily.    Historical Provider, MD  omeprazole (PRILOSEC) 20 MG capsule Take 2 capsules (40 mg total) by mouth daily. 11/14/16   Mellody Dance, DO  Probiotic Product (PROBIOTIC DAILY PO) Take 1 tablet by mouth daily.    Historical Provider, MD  rosuvastatin (CRESTOR) 5 MG tablet Take 1 tablet (5 mg total) by mouth daily. 11/14/16   Mellody Dance, DO  vitamin B-12 (CYANOCOBALAMIN) 100 MCG tablet Take 100 mcg by mouth daily.    Historical Provider, MD   Meds Ordered and Administered this Visit  Medications - No data to display  BP 141/99 (BP Location: Left Arm)   Pulse 94   Temp 98.7 F (37.1 C) (Oral)   Resp 16   LMP 06/24/2009   SpO2 100%  No data found.   Physical Exam  Constitutional: She is oriented to person, place, and time. She appears well-developed and well-nourished. No distress.  Neck: Neck supple.  Cardiovascular: Normal rate and normal heart sounds.   Pulmonary/Chest: Effort normal and breath sounds normal. No respiratory distress.  Abdominal: Soft. Bowel sounds are normal. She exhibits no distension and no mass. There is no tenderness. There is no rebound and no guarding. No hernia.  Obese  Musculoskeletal: Normal range of motion. She exhibits no edema.  Neurological: She is alert and oriented to person, place, and time.  Skin: Skin is  warm and dry.  Psychiatric: She has a normal mood and affect.  Nursing note and vitals reviewed.   Urgent Care Course     Procedures (including critical care time)  Labs Review Labs Reviewed  POCT URINALYSIS DIP (DEVICE) - Abnormal; Notable for the following:       Result Value   Urobilinogen, UA 2.0 (*)    Leukocytes, UA TRACE (*)    All other components within normal limits  POCT I-STAT, CHEM 8 - Abnormal; Notable for the following:    Glucose, Bld 101 (*)    All other components within normal limits  C DIFFICILE QUICK SCREEN W PCR REFLEX    Imaging Review Dg Abd 1 View  Result Date: 02/12/2017 CLINICAL DATA:  Abdominal pain with diarrhea for 1 month. EXAM: ABDOMEN - 1 VIEW COMPARISON:  None. FINDINGS: Prior cholecystectomy. There is a non obstructive bowel gas pattern. No supine evidence  of free air. No organomegaly or suspicious calcification. No acute bony abnormality. IMPRESSION: No acute findings. Electronically Signed   By: Rolm Baptise M.D.   On: 02/12/2017 13:54     Visual Acuity Review  Right Eye Distance:   Left Eye Distance:   Bilateral Distance:    Right Eye Near:   Left Eye Near:    Bilateral Near:         MDM   1. Diarrhea, unspecified type    Your stool has been sent off for testing for organisms that may cause infection in the colon and does produce diarrhea. The x-ray is strongly suggestive of a large amount of stool in the colon and the reason that you are having primarily liquid stools is is likely due to to the fact that the only thing that can pass by the stool is liquid. Once the testing of the stool is back and there is no evidence of infection recommend that he use the MiraLAX for a good cleansing. In the meantime still drink plenty of liquids and stay well-hydrated. Continue taking your Prilosec for GERD and he may add Zantac twice a day or at least once in the evening to help at nighttime when lying down. Stool PCR pending    Janne Napoleon, NP 02/12/17 1429

## 2017-02-12 NOTE — Discharge Instructions (Addendum)
Your stool has been sent off for testing for organisms that may cause infection in the colon and does produce diarrhea. The x-ray is strongly suggestive of a large amount of stool in the colon and the reason that you are having primarily liquid stools is is likely due to to the fact that the only thing that can pass by the stool is liquid. Once the testing of the stool is back and there is no evidence of infection recommend that he use the MiraLAX for a good cleansing. In the meantime still drink plenty of liquids and stay well-hydrated. Continue taking your Prilosec for GERD and he may add Zantac twice a day or at least once in the evening to help at nighttime when lying down.

## 2017-02-19 ENCOUNTER — Ambulatory Visit: Payer: BLUE CROSS/BLUE SHIELD | Admitting: Nurse Practitioner

## 2017-02-19 DIAGNOSIS — Z6836 Body mass index (BMI) 36.0-36.9, adult: Secondary | ICD-10-CM | POA: Diagnosis not present

## 2017-02-19 DIAGNOSIS — Z01419 Encounter for gynecological examination (general) (routine) without abnormal findings: Secondary | ICD-10-CM | POA: Diagnosis not present

## 2017-02-19 LAB — HM PAP SMEAR: HM Pap smear: NEGATIVE

## 2017-02-21 ENCOUNTER — Ambulatory Visit: Payer: BLUE CROSS/BLUE SHIELD | Admitting: Family Medicine

## 2017-02-26 ENCOUNTER — Ambulatory Visit: Payer: BLUE CROSS/BLUE SHIELD | Admitting: Nurse Practitioner

## 2017-05-10 ENCOUNTER — Other Ambulatory Visit: Payer: Self-pay | Admitting: Family Medicine

## 2017-05-10 DIAGNOSIS — I10 Essential (primary) hypertension: Secondary | ICD-10-CM

## 2017-05-10 DIAGNOSIS — E785 Hyperlipidemia, unspecified: Secondary | ICD-10-CM

## 2017-05-14 ENCOUNTER — Other Ambulatory Visit: Payer: Self-pay | Admitting: Family Medicine

## 2017-05-14 DIAGNOSIS — E785 Hyperlipidemia, unspecified: Secondary | ICD-10-CM

## 2017-05-14 DIAGNOSIS — I10 Essential (primary) hypertension: Secondary | ICD-10-CM

## 2017-05-22 ENCOUNTER — Encounter: Payer: Self-pay | Admitting: Family Medicine

## 2017-05-22 ENCOUNTER — Ambulatory Visit (INDEPENDENT_AMBULATORY_CARE_PROVIDER_SITE_OTHER): Payer: BLUE CROSS/BLUE SHIELD | Admitting: Family Medicine

## 2017-05-22 VITALS — BP 145/86 | HR 74 | Ht 66.0 in | Wt 235.3 lb

## 2017-05-22 DIAGNOSIS — E669 Obesity, unspecified: Secondary | ICD-10-CM | POA: Diagnosis not present

## 2017-05-22 DIAGNOSIS — E785 Hyperlipidemia, unspecified: Secondary | ICD-10-CM | POA: Diagnosis not present

## 2017-05-22 DIAGNOSIS — F411 Generalized anxiety disorder: Secondary | ICD-10-CM

## 2017-05-22 DIAGNOSIS — C50411 Malignant neoplasm of upper-outer quadrant of right female breast: Secondary | ICD-10-CM

## 2017-05-22 DIAGNOSIS — I10 Essential (primary) hypertension: Secondary | ICD-10-CM | POA: Diagnosis not present

## 2017-05-22 DIAGNOSIS — IMO0001 Reserved for inherently not codable concepts without codable children: Secondary | ICD-10-CM

## 2017-05-22 DIAGNOSIS — K219 Gastro-esophageal reflux disease without esophagitis: Secondary | ICD-10-CM

## 2017-05-22 DIAGNOSIS — D509 Iron deficiency anemia, unspecified: Secondary | ICD-10-CM

## 2017-05-22 DIAGNOSIS — F419 Anxiety disorder, unspecified: Secondary | ICD-10-CM

## 2017-05-22 MED ORDER — CARVEDILOL 6.25 MG PO TABS: 6.2500 mg | ORAL_TABLET | Freq: Two times a day (BID) | ORAL | 0 refills | Status: DC

## 2017-05-22 MED ORDER — OMEPRAZOLE 20 MG PO CPDR: 40.0000 mg | DELAYED_RELEASE_CAPSULE | Freq: Every day | ORAL | 0 refills | Status: DC

## 2017-05-22 MED ORDER — ROSUVASTATIN CALCIUM 5 MG PO TABS: 5.0000 mg | ORAL_TABLET | Freq: Every day | ORAL | 0 refills | Status: DC

## 2017-05-22 MED ORDER — BUSPIRONE HCL 5 MG PO TABS: 5.0000 mg | ORAL_TABLET | Freq: Three times a day (TID) | ORAL | 0 refills | Status: DC

## 2017-05-22 NOTE — Patient Instructions (Addendum)
When patient follows up with the [facility] she will seek counseling there.  I explained havin a life coach to help her deal with her anxiety woud be very beneficial.      If you have insomnia or difficulty sleeping, this information is for you:  - Avoid caffeinated beverages after lunch,  no alcoholic beverages,  no eating within 2-3 hours of lying down,  avoid exposure to blue light before bed,  avoid daytime naps, and  needs to maintain a regular sleep schedule- go to sleep and wake up around the same time every night.   - Resolve concerns or worries before entering bedroom:  Discussed relaxation techniques with patient and to keep a journal to write down fears\ worries.  I suggested seeing a counselor for CBT.   - Recommend patient meditate or do deep breathing exercises to help relax.   Incorporate the use of white noise machines or listen to "sleep meditation music", or recordings of guided meditations for sleep from YouTube which are free, such as  "guided meditation for detachment from over thinking"  by Mayford Knife.     Generalized Anxiety Disorder, Adult Generalized anxiety disorder (GAD) is a mental health disorder. People with this condition constantly worry about everyday events. Unlike normal anxiety, worry related to GAD is not triggered by a specific event. These worries also do not fade or get better with time. GAD interferes with life functions, including relationships, work, and school. GAD can vary from mild to severe. People with severe GAD can have intense waves of anxiety with physical symptoms (panic attacks). What are the causes? The exact cause of GAD is not known. What increases the risk? This condition is more likely to develop in:  Women.  People who have a family history of anxiety disorders.  People who are very shy.  People who experience very stressful life events, such as the death of a loved one.  People who have a very stressful family  environment.  What are the signs or symptoms? People with GAD often worry excessively about many things in their lives, such as their health and family. They may also be overly concerned about:  Doing well at work.  Being on time.  Natural disasters.  Friendships.  Physical symptoms of GAD include:  Fatigue.  Muscle tension or having muscle twitches.  Trembling or feeling shaky.  Being easily startled.  Feeling like your heart is pounding or racing.  Feeling out of breath or like you cannot take a deep breath.  Having trouble falling asleep or staying asleep.  Sweating.  Nausea, diarrhea, or irritable bowel syndrome (IBS).  Headaches.  Trouble concentrating or remembering facts.  Restlessness.  Irritability.  How is this diagnosed? Your health care provider can diagnose GAD based on your symptoms and medical history. You will also have a physical exam. The health care provider will ask specific questions about your symptoms, including how severe they are, when they started, and if they come and go. Your health care provider may ask you about your use of alcohol or drugs, including prescription medicines. Your health care provider may refer you to a mental health specialist for further evaluation. Your health care provider will do a thorough examination and may perform additional tests to rule out other possible causes of your symptoms. To be diagnosed with GAD, a person must have anxiety that:  Is out of his or her control.  Affects several different aspects of his or her life, such as  work and relationships.  Causes distress that makes him or her unable to take part in normal activities.  Includes at least three physical symptoms of GAD, such as restlessness, fatigue, trouble concentrating, irritability, muscle tension, or sleep problems.  Before your health care provider can confirm a diagnosis of GAD, these symptoms must be present more days than they are not,  and they must last for six months or longer. How is this treated? The following therapies are usually used to treat GAD:  Medicine. Antidepressant medicine is usually prescribed for long-term daily control. Antianxiety medicines may be added in severe cases, especially when panic attacks occur.  Talk therapy (psychotherapy). Certain types of talk therapy can be helpful in treating GAD by providing support, education, and guidance. Options include: ? Cognitive behavioral therapy (CBT). People learn coping skills and techniques to ease their anxiety. They learn to identify unrealistic or negative thoughts and behaviors and to replace them with positive ones. ? Acceptance and commitment therapy (ACT). This treatment teaches people how to be mindful as a way to cope with unwanted thoughts and feelings. ? Biofeedback. This process trains you to manage your body's response (physiological response) through breathing techniques and relaxation methods. You will work with a therapist while machines are used to monitor your physical symptoms.  Stress management techniques. These include yoga, meditation, and exercise.  A mental health specialist can help determine which treatment is best for you. Some people see improvement with one type of therapy. However, other people require a combination of therapies. Follow these instructions at home:  Take over-the-counter and prescription medicines only as told by your health care provider.  Try to maintain a normal routine.  Try to anticipate stressful situations and allow extra time to manage them.  Practice any stress management or self-calming techniques as taught by your health care provider.  Do not punish yourself for setbacks or for not making progress.  Try to recognize your accomplishments, even if they are small.  Keep all follow-up visits as told by your health care provider. This is important. Contact a health care provider if:  Your symptoms  do not get better.  Your symptoms get worse.  You have signs of depression, such as: ? A persistently sad, cranky, or irritable mood. ? Loss of enjoyment in activities that used to bring you joy. ? Change in weight or eating. ? Changes in sleeping habits. ? Avoiding friends or family members. ? Loss of energy for normal tasks. ? Feelings of guilt or worthlessness. Get help right away if:  You have serious thoughts about hurting yourself or others. If you ever feel like you may hurt yourself or others, or have thoughts about taking your own life, get help right away. You can go to your nearest emergency department or call:  Your local emergency services (911 in the [place]  A suicide crisis helpline, such as the [facility] at (717)386-5036. This is open 24 hours a day.  Summary  Generalized anxiety disorder (GAD) is a mental health disorder that involves worry that is not triggered by a specific event.  People with GAD often worry excessively about many things in their lives, such as their health and family.  GAD may cause physical symptoms such as restlessness, trouble concentrating, sleep problems, frequent sweating, nausea, diarrhea, headaches, and trembling or muscle twitching.  A mental health specialist can help determine which treatment is best for you. Some people see improvement with one type of therapy. However, other  people require a combination of therapies. This information is not intended to replace advice given to you by your health care provider. Make sure you discuss any questions you have with your health care provider. Document Released: 03/23/2013 Document Revised: 10/16/2016 Document Reviewed: 10/16/2016 Elsevier Interactive Patient Education  [employer].

## 2017-05-22 NOTE — Assessment & Plan Note (Signed)
Significant barriers to patient's weight loss  After discussion of referral to dietetics versus weight loss management, patient opted for the latter   Referral to weight management placed

## 2017-05-22 NOTE — Assessment & Plan Note (Signed)
Patient sees [facility].  Following the near future.

## 2017-05-22 NOTE — Assessment & Plan Note (Signed)
Last checked 6 months ago.  We will recheck in December 2018.    Well controlled.  Refill meds given.

## 2017-05-22 NOTE — Assessment & Plan Note (Signed)
CBC stable when last checked 6 months ago at 12.8 hemoglobin.

## 2017-05-22 NOTE — Assessment & Plan Note (Addendum)
Recommend getting a counselor  Patient dead set against going on a "antidepressant" of any kind or an SSRI for her anxiety.  She will not express her concerns why but just states she does not want to be on an antidepressant.  After long discussion regarding detriments to long-term benzodiazepine use, patient agrees to do trial of Buspirone   Relaxation techniques and deep breathing reviewed with patient  Counseled regarding anxiety likely the reason for frequent awakenings at night, getting up to urinate and explained if we could get better control of her anxiety she would

## 2017-05-22 NOTE — Assessment & Plan Note (Signed)
>>  ASSESSMENT AND PLAN FOR ANXIETY WRITTEN ON 05/22/2017  1:56 PM BY OPALSKI, Tabiona, DO  Recommend getting a counselor  Patient dead set against going on a "antidepressant" of any kind or an SSRI for her anxiety.  She will not express her concerns why but just states she does not want to be on an antidepressant.  After long discussion regarding detriments to long-term benzodiazepine use, patient agrees to do trial of Buspirone   Relaxation techniques and deep breathing reviewed with patient  Counseled regarding anxiety likely the reason for frequent awakenings at night, getting up to urinate and explained if we could get better control of her anxiety she would

## 2017-05-22 NOTE — Progress Notes (Signed)
Impression and Recommendations:    1. Obesity, Class II, BMI 35-39.9, with comorbidity   2. Essential hypertension   3. Hyperlipidemia, unspecified hyperlipidemia type   4. GAD (generalized anxiety disorder)   5. Gastroesophageal reflux disease, esophagitis presence not specified   6. Anxiety   7. Primary cancer of upper-outer quadrant R breast   8. Iron deficiency anemia, unspecified iron deficiency anemia type     Anxiety Recommend getting a counselor  Patient dead set against going on a "antidepressant" of any kind or an SSRI for her anxiety.  She will not express her concerns why but just states she does not want to be on an antidepressant.  After long discussion regarding detriments to long-term benzodiazepine use, patient agrees to do trial of Buspirone   Relaxation techniques and deep breathing reviewed with patient  Counseled regarding anxiety likely the reason for frequent awakenings at night, getting up to urinate and explained if we could get better control of her anxiety she would  Obesity, Class II, BMI 35-39.9, with comorbidity Significant barriers to patient's weight loss  After discussion of referral to dietetics versus weight loss management, patient opted for the latter   Referral to weight management placed   Essential hypertension-  dx by Cards Jan '14 Patient not checking at home, not following low-salt diet.  Weight has gone up 12 pounds from my last office visit with her.  Explained this is detriment to blood pressure.  Patient refuses to increase blood pressure medications today and understands risks.  RF's given- same dose despite my recs  Primary cancer of upper-outer quadrant R breast Patient sees [facility].  Following the near future.    Hyperlipidemia- dx by [facility] Jan '14 Last checked 6 months ago.  We will recheck in December 2018.    Well controlled.  Refill meds given.  History of Iron deficiency anemia- 2nd chemo  induced Fe Def CBC stable when last checked 6 months ago at 12.8 hemoglobin.    Education and routine counseling performed. Handouts provided.   New Prescriptions   BUSPIRONE (BUSPAR) 5 MG TABLET    Take 1 tablet (5 mg total) by mouth 3 (three) times daily.    Orders Placed This Encounter  Procedures  . Amb Ref to Medical Weight Management     Return in about 6 weeks (around 07/03/2017), or f/up since starting Buspirone- new med 4 GAD, for htn, anxiety, gerd, sleep/urinate at nite.  The patient was counseled, risk factors were discussed, anticipatory guidance given.  Pt was in the office today for 40+ minutes, with over 50% time spent in face to face counseling of patients various medical conditions, treatment plans of those medical conditions including medicine management and lifestyle modification, strategies to improve health and well being; and in coordination of care. SEE ABOVE FOR DETAILS  Gross side effects, risk and benefits, and alternatives of medications discussed with patient.  Patient is aware that all medications have potential side effects and we are unable to predict every side effect or drug-drug interaction that may occur.  Expresses verbal understanding and consents to current therapy plan and treatment regimen.  Please see AVS handed out to patient at the end of our visit for further patient instructions/ counseling done pertaining to today's office visit.    Note: This document was prepared using Dragon voice recognition software and may include unintentional dictation errors.     Subjective:    Chief Complaint  Patient presents with  .  Anxiety  . Hypertension  . Hyperlipidemia    HPI: Julia Zuniga is a 56 y.o. female who presents to [place] [facility] today for follow up for HTN, Obesity and anxiety, insomnia.     LAST OV:  Patient's desired goals: - Cont my fitness pal tracking of foods  - exercise at moderate intensity  40min /d for 2 wks, then inc by 77min each week as tolerated by your knees.  - 1 mo: wt loss goals=  8 pounds  - eat low gylcemic indexed carbs- I gave pt handouts and websites to go to for further information  ------> Patient did not reach any of those goals from last time.   Obesity:  Up 12 lbs from prior by changing her diet back to normal one.   Doesn't like to track foods/ ww works but didn't like the tracking.    Patient was on a low-carb high-protein diet and hair was falling out.  This was very posturing for patient and she had to go off that diet.  She is still struggling to lose weight and a little frustrated.  She will entertain going to register dietitian.  Patient will   Trouble falling asleep---->   Patient states she ha difficulty faling asleep due to anxiety. She worries when she wakes up that sh my have to go to the bthroom in he future so she always goes into the bathroom regardles of wether or not she has to urinate.   She denies any urinary inontinence or medications for that.    Trouble with anxiety-->  takes ativan 1-2 times daily most days, occ takes 3 /d or not at all.  Been on med for yrs and yrs-  75 or more.   Patient states she's not going on an SSRI because they re antidepressants.  She is very adamant about this today. She has never been on ny other medicines for her nxiety.Marland Kitchen      HTN:  -  Her blood pressure has not been checking at home.   - Patient reports good compliance with blood pressure medications  - Denies medication S-E   - Smoking Status noted   - She denies new onset of: chest pain, exercise intolerance, shortness of breath, dizziness, visual changes, headache, lower extremity swelling or claudication.   Today their BP is BP: (!) 145/86 (has not taken medication)   Last 3 blood pressure readings in our office are as follows: BP Readings from Last 3 Encounters:  05/22/17 (!) 145/86  02/12/17 141/99  11/14/16 136/81    Pulse Readings  from Last 3 Encounters:  05/22/17 74  02/12/17 94  11/14/16 73    Filed Weights   05/22/17 0840  Weight: 235 lb 4.8 oz (106.7 kg)      Patient Care Team    Relationship Specialty Notifications Start End  Mellody Dance, DO PCP - General Family Medicine  06/05/16   Magrinat, Virgie Dad, MD Consulting Physician Oncology  12/04/16      Lab Results  Component Value Date   CREATININE 0.60 02/12/2017   BUN 9 02/12/2017   NA 143 02/12/2017   K 3.8 02/12/2017   CL 102 02/12/2017   CO2 28 11/14/2016    Lab Results  Component Value Date   CHOL 157 11/14/2016   CHOL 152 05/15/2016   CHOL 212 (H) 11/21/2015    Lab Results  Component Value Date   HDL 53 11/14/2016   HDL 47 05/15/2016  HDL 40 (L) 11/21/2015    Lab Results  Component Value Date   LDLCALC 80 11/14/2016   LDLCALC 82 05/15/2016   LDLCALC 139 (H) 11/21/2015    Lab Results  Component Value Date   TRIG 120 11/14/2016   TRIG 114 05/15/2016   TRIG 166 (H) 11/21/2015    Lab Results  Component Value Date   CHOLHDL 3.0 11/14/2016   CHOLHDL 3.2 05/15/2016   CHOLHDL 5.3 (H) 11/21/2015    No results found for: LDLDIRECT ===================================================================   Patient Active Problem List   Diagnosis Date Noted  . Obesity, Class II, BMI 35-39.9, with comorbidity 11/14/2016    Priority: High  . 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 Essential hypertension-  dx by Cards Jan '14 01/07/2013    Priority: High  . Hyperlipidemia- dx by Cards Jan '14 01/07/2013    Priority: High  . Primary cancer of upper-outer quadrant R breast 12/12/2012    Priority: High  . 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 GERD (gastroesophageal reflux disease) 08/11/2014    Priority: Medium  . Anxiety 05/29/2014    Priority: Medium  . Family history of stomach cancer- dad died age 58  2016-12-26    Priority: Low  . S/P chemotherapy, time since greater than 12 weeks 11/14/2016    Priority: Low  . Fatigue 08/31/2015    Priority: Low  . GAD (generalized anxiety disorder) 05/22/2017  . Heel pain 07/05/2016     Past Medical History:  Diagnosis Date  . Anemia   . Anxiety   . Breast cancer ([place]) 12/08/12   Right  . Diverticulitis    2012  . Diverticulosis   . GERD (gastroesophageal reflux disease)   . Heartburn   . 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 History of radiation therapy 05/13/13-06/18/13   right breast, 5000 cGy 25 sessions  . Hot flashes   . Hyperlipidemia   . Hypertension   . PONV (postoperative nausea and vomiting)      Past Surgical History:  Procedure Laterality Date  . BREAST LUMPECTOMY WITH SENTINEL LYMPH NODE BIOPSY  12/31/2012   Procedure: BREAST LUMPECTOMY WITH SENTINEL LYMPH NODE BX;  Surgeon: Stark Klein, MD;  Location: [facility];  Service: General;  Laterality: Right;  . CESAREAN SECTION     x 3  . CHOLECYSTECTOMY  2001   lap choli  . PORT-A-CATH REMOVAL Left 02/24/2014   Procedure: REMOVAL PORT-A-CATH;  Surgeon: Stark Klein, MD;  Location: [facility];  Service: General;  Laterality: Left;  . PORTACATH PLACEMENT  12/31/2012   Procedure: INSERTION PORT-A-CATH;  Surgeon: Stark Klein, MD;  Location: [facility];  Service: General;  Laterality: Left;  Left Subclavian Vein  . RE-EXCISION OF BREAST CANCER,SUPERIOR MARGINS  01/14/2013   Procedure: RE-EXCISION OF BREAST CANCER,SUPERIOR MARGINS;  Surgeon: Stark Klein, MD;  Location: [facility];  Service: General;  Laterality: Right;  right breast re excision for markers   . TONSILLECTOMY AND ADENOIDECTOMY    . TUBAL LIGATION       Family History  Problem Relation Age of Onset  . Stomach cancer Father   . Lung cancer Maternal Uncle   . Skin cancer Maternal Grandmother   . Lung cancer Maternal Grandfather   . Lymphoma Cousin     . Colon cancer Neg Hx   .  Esophageal cancer Neg Hx   . Rectal cancer Neg Hx      History  Drug Use No  ,  History  Alcohol Use  . 0.0 oz/week    Comment: occ  ,  History  Smoking Status  . Never Smoker  Smokeless Tobacco  . Never Used  ,    Current Outpatient Prescriptions on File Prior to Visit  Medication Sig Dispense Refill  . b complex vitamins tablet Take 1 tablet by mouth daily.    . Cholecalciferol (D3 HIGH POTENCY) 1000 units capsule Take 1,000 Units by mouth daily.    Marland Kitchen ibuprofen (ADVIL,MOTRIN) 200 MG tablet Take 400 mg by mouth every 6 (six) hours as needed. Pain    . LORazepam (ATIVAN) 0.5 MG tablet Take 1 tablet (0.5 mg total) by mouth 3 (three) times daily as needed for anxiety. 60 tablet 5  . Multiple Vitamins-Minerals (MULTIVITAMIN WITH MINERALS) tablet Take 1 tablet by mouth daily.    . vitamin B-12 (CYANOCOBALAMIN) 100 MCG tablet Take 100 mcg by mouth daily.     No current facility-administered medications on file prior to visit.      Allergies  Allergen Reactions  . Chlorhexidine     Patient skin gets bright red with rash.      . Codeine Swelling  . Medralone [Methylprednisolone Acetate]     "coming out of skin"   . Oxycodone-Acetaminophen Other (See Comments)    Face got red and hot     Review of Systems:   General:  Denies fever, chills Optho/Auditory:   Denies visual changes, blurred vision Respiratory:   Denies SOB, cough, wheeze, DIB  Cardiovascular:   Denies chest pain, palpitations, painful respirations Gastrointestinal:   Denies nausea, vomiting, diarrhea.  Endocrine:     Denies new hot or cold intolerance Musculoskeletal:  Denies joint swelling, gait issues, or new unexplained myalgias/ arthralgias Skin:  Denies rash, suspicious lesions  Neurological:    Denies dizziness, unexplained weakness, numbness  Psychiatric/Behavioral:   Denies mood changes  Objective:    Blood pressure (!) 145/86, pulse 74, height 5\' 6"  (1.676 m),  weight 235 lb 4.8 oz (106.7 kg), last menstrual period 06/24/2009.  Body mass index is 37.98 kg/m.  General: Well Developed, well nourished, and in no acute distress.  HEENT: Normocephalic, atraumatic, pupils equal round reactive to light, neck supple, No carotid bruits, no JVD Skin: Warm and dry, cap RF less 2 sec Cardiac: Regular rate and rhythm, S1, S2 WNL's, no murmurs rubs or gallops Respiratory: ECTA B/L, Not using accessory muscles, speaking in full sentences. NeuroM-Sk: Ambulates w/o assistance, moves ext * 4 w/o difficulty, sensation grossly intact.  Ext: scant edema b/l lower ext Psych: No HI/SI, judgement and insight good, Euthymic mood. Full Affect.

## 2017-05-22 NOTE — Assessment & Plan Note (Signed)
Patient not checking at home, not following low-salt diet.  Weight has gone up 12 pounds from my last office visit with her.  Explained this is detriment to blood pressure.  Patient refuses to increase blood pressure medications today and understands risks.  RF's given- same dose despite my recs

## 2017-06-04 ENCOUNTER — Ambulatory Visit: Payer: BLUE CROSS/BLUE SHIELD | Admitting: Oncology

## 2017-06-04 ENCOUNTER — Other Ambulatory Visit (HOSPITAL_BASED_OUTPATIENT_CLINIC_OR_DEPARTMENT_OTHER): Payer: BLUE CROSS/BLUE SHIELD

## 2017-06-04 ENCOUNTER — Ambulatory Visit (HOSPITAL_BASED_OUTPATIENT_CLINIC_OR_DEPARTMENT_OTHER): Payer: BLUE CROSS/BLUE SHIELD | Admitting: Oncology

## 2017-06-04 VITALS — BP 149/84 | HR 98 | Temp 98.3°F | Resp 18 | Ht 66.0 in | Wt 233.7 lb

## 2017-06-04 DIAGNOSIS — F419 Anxiety disorder, unspecified: Secondary | ICD-10-CM | POA: Diagnosis not present

## 2017-06-04 DIAGNOSIS — D509 Iron deficiency anemia, unspecified: Secondary | ICD-10-CM | POA: Diagnosis not present

## 2017-06-04 DIAGNOSIS — C50411 Malignant neoplasm of upper-outer quadrant of right female breast: Secondary | ICD-10-CM

## 2017-06-04 DIAGNOSIS — Z853 Personal history of malignant neoplasm of breast: Secondary | ICD-10-CM | POA: Diagnosis not present

## 2017-06-04 DIAGNOSIS — G629 Polyneuropathy, unspecified: Secondary | ICD-10-CM

## 2017-06-04 DIAGNOSIS — D508 Other iron deficiency anemias: Secondary | ICD-10-CM

## 2017-06-04 DIAGNOSIS — Z171 Estrogen receptor negative status [ER-]: Secondary | ICD-10-CM

## 2017-06-04 LAB — CBC WITH DIFFERENTIAL/PLATELET
BASO%: 1.1 % (ref 0.0–2.0)
Basophils Absolute: 0.1 10*3/uL (ref 0.0–0.1)
EOS%: 2.5 % (ref 0.0–7.0)
Eosinophils Absolute: 0.2 10*3/uL (ref 0.0–0.5)
HCT: 39.5 % (ref 34.8–46.6)
HGB: 12.7 g/dL (ref 11.6–15.9)
LYMPH%: 33.4 % (ref 14.0–49.7)
MCH: 26.1 pg (ref 25.1–34.0)
MCHC: 32.1 g/dL (ref 31.5–36.0)
MCV: 81.2 fL (ref 79.5–101.0)
MONO#: 0.4 10*3/uL (ref 0.1–0.9)
MONO%: 5.2 % (ref 0.0–14.0)
NEUT%: 57.8 % (ref 38.4–76.8)
NEUTROS ABS: 4.8 10*3/uL (ref 1.5–6.5)
Platelets: 228 10*3/uL (ref 145–400)
RBC: 4.86 10*6/uL (ref 3.70–5.45)
RDW: 13.4 % (ref 11.2–14.5)
WBC: 8.4 10*3/uL (ref 3.9–10.3)
lymph#: 2.8 10*3/uL (ref 0.9–3.3)

## 2017-06-04 LAB — COMPREHENSIVE METABOLIC PANEL
ALT: 18 U/L (ref 0–55)
AST: 15 U/L (ref 5–34)
Albumin: 4.1 g/dL (ref 3.5–5.0)
Alkaline Phosphatase: 106 U/L (ref 40–150)
Anion Gap: 9 mEq/L (ref 3–11)
BILIRUBIN TOTAL: 0.34 mg/dL (ref 0.20–1.20)
BUN: 10.1 mg/dL (ref 7.0–26.0)
CHLORIDE: 107 meq/L (ref 98–109)
CO2: 27 meq/L (ref 22–29)
Calcium: 10.1 mg/dL (ref 8.4–10.4)
Creatinine: 0.7 mg/dL (ref 0.6–1.1)
GLUCOSE: 91 mg/dL (ref 70–140)
Potassium: 4.5 mEq/L (ref 3.5–5.1)
SODIUM: 143 meq/L (ref 136–145)
TOTAL PROTEIN: 7.6 g/dL (ref 6.4–8.3)

## 2017-06-04 NOTE — Progress Notes (Signed)
[place]  [phone]) [phone] Fax:(336) [phone]     ID: LOUISIANA SEARLES DOB: 10/20/1961  MR#: [id number]  DDU#:[id number]  Patient Care Team: Mellody Dance, DO as PCP - General (Family Medicine) Ladan Vanderzanden, Virgie Dad, MD as Consulting Physician (Oncology) PCP: Mellody Dance, DO GYN: Molli Posey MD SU: Stark Klein MD OTHER MD: Thea Silversmith MD  CHIEF COMPLAINT: estrogen receptor negative breast cancer  CURRENT TREATMENT: Observation   BREAST CANCER HISTORY: From doctor calcium tends 12/17/2012 intake note:  Julia Zuniga is a 56 y.o. female. With medical history significant for hyperlipidemia diverticulitis and hiatal hernia. She presented for a screening mammogram in December 2013 that showed an area of abnormality on the right. This measured about 1.6 cm. 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 HER-2/neu was amplified at 3.88. Patient did not have a MRI scan performed. Post biopsy she is doing well. She is without any complaints. Patient case was presented at the multidisciplinary breast conference. She is seen today by Dr. Thea Silversmith and Dr. Theda Sers. Patient does desire breast conservation.  The patient's subsequent history is as detailed below  INTERVAL HISTORY: [name] returns today for follow-up of her estrogen receptor negative breast cancer. Recall this was HER-2 positive and she received chemotherapy and anti-HER-2 treatment. She is currently under observation alone.    REVIEW OF SYSTEMS: She is doing "good" overall. She works out chiefly by doing her housework and yard work. A detailed review of systems today was otherwise stable  PAST MEDICAL HISTORY: Past Medical History:  Diagnosis Date  . Anemia   . Anxiety   . Breast cancer ([facility]) 12/08/12   Right  . Diverticulitis    2012  . Diverticulosis   . GERD  (gastroesophageal reflux disease)   . Heartburn   . 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 History of radiation therapy 05/13/13-06/18/13   right breast, 5000 cGy 25 sessions  . Hot flashes   . Hyperlipidemia   . Hypertension   . PONV (postoperative nausea and vomiting)     PAST SURGICAL HISTORY: Past Surgical History:  Procedure Laterality Date  . BREAST LUMPECTOMY WITH SENTINEL LYMPH NODE BIOPSY  12/31/2012   Procedure: BREAST LUMPECTOMY WITH SENTINEL LYMPH NODE BX;  Surgeon: Stark Klein, MD;  Location: [facility];  Service: General;  Laterality: Right;  . CESAREAN SECTION     x 3  . CHOLECYSTECTOMY  2001   lap choli  . PORT-A-CATH REMOVAL Left 02/24/2014   Procedure: REMOVAL PORT-A-CATH;  Surgeon: Stark Klein, MD;  Location: [facility];  Service: General;  Laterality: Left;  . PORTACATH PLACEMENT  12/31/2012   Procedure: INSERTION PORT-A-CATH;  Surgeon: Stark Klein, MD;  Location: [facility];  Service: General;  Laterality: Left;  Left Subclavian Vein  . RE-EXCISION OF BREAST CANCER,SUPERIOR MARGINS  01/14/2013   Procedure: RE-EXCISION OF BREAST CANCER,SUPERIOR MARGINS;  Surgeon: Stark Klein, MD;  Location: [facility];  Service: General;  Laterality: Right;  right breast re excision for markers   . TONSILLECTOMY AND ADENOIDECTOMY    . TUBAL LIGATION      FAMILY HISTORY Family History  Problem Relation Age of Onset  . Stomach cancer Father   . Lung cancer Maternal Uncle   . Skin cancer Maternal Grandmother   . Lung cancer Maternal Grandfather   .  Lymphoma Cousin   . Colon cancer Neg Hx   . Esophageal cancer Neg Hx   . Rectal cancer Neg Hx    The patient's father died at the age of 55 from stomach cancer. The patient's mother is alive at age 32. [name] is a single child. There is no history of breast or ovarian cancer in the family to her knowledge.  GYNECOLOGIC HISTORY:  Patient's last menstrual period was  06/24/2009. Menarche age 73, first live birth age 68. She is GX P3. Her periods have stopped before she started chemotherapy. She never took hormone replacement.  SOCIAL HISTORY:  She is a Futures trader. She has a daughter from her first marriage, Julia Zuniga, 55 years old, lives in [place] and works as a Runner, broadcasting/film/video. Her second husband, Nash Dimmer, works as a Curator. They have 2 daughters,Julia Zuniga, 65 years old, currently attending [employer], and Julia Zuniga, currently 16, at home. The patient attends [employer] [address].    ADVANCED DIRECTIVES: Not in place   HEALTH MAINTENANCE: Social History  Substance Use Topics  . Smoking status: Never Smoker  . Smokeless tobacco: Never Used  . Alcohol use 0.0 oz/week     Comment: occ     Colonoscopy:  PAP:  Bone density:  Lipid panel:  Allergies  Allergen Reactions  . Chlorhexidine     Patient skin gets bright red with rash.      . Codeine Swelling  . Medralone [Methylprednisolone Acetate]     "coming out of skin"   . Oxycodone-Acetaminophen Other (See Comments)    Face got red and hot    Current Outpatient Prescriptions  Medication Sig Dispense Refill  . b complex vitamins tablet Take 1 tablet by mouth daily.    . busPIRone (BUSPAR) 5 MG tablet Take 1 tablet (5 mg total) by mouth 3 (three) times daily. 90 tablet 0  . carvedilol (COREG) 6.25 MG tablet Take 1 tablet (6.25 mg total) by mouth 2 (two) times daily with a meal. 180 tablet 0  . Cholecalciferol (D3 HIGH POTENCY) 1000 units capsule Take 1,000 Units by mouth daily.    Marland Kitchen ibuprofen (ADVIL,MOTRIN) 200 MG tablet Take 400 mg by mouth every 6 (six) hours as needed. Pain    . LORazepam (ATIVAN) 0.5 MG tablet Take 1 tablet (0.5 mg total) by mouth 3 (three) times daily as needed for anxiety. 60 tablet 5  . Multiple Vitamins-Minerals (MULTIVITAMIN WITH MINERALS) tablet Take 1 tablet by mouth daily.    Marland Kitchen omeprazole (PRILOSEC) 20 MG capsule Take 2 capsules (40 mg total) by mouth daily. 180  capsule 0  . rosuvastatin (CRESTOR) 5 MG tablet Take 1 tablet (5 mg total) by mouth daily. 90 tablet 0  . vitamin B-12 (CYANOCOBALAMIN) 100 MCG tablet Take 100 mcg by mouth daily.     No current facility-administered medications for this visit.     OBJECTIVE: Middle-aged white womanIn no acute distress  Vitals:   06/04/17 1132  BP: (!) 149/84  Pulse: 98  Resp: 18  Temp: 98.3 F (36.8 C)     Body mass index is 37.72 kg/m.    ECOG FS:0 - Asymptomatic  Sclerae unicteric, pupils round and equal Oropharynx clear and moist No cervical or supraclavicular adenopathy Lungs no rales or rhonchi Heart regular rate and rhythm Abd soft, nontender, positive bowel sounds MSK no focal spinal tenderness, no upper extremity lymphedema Neuro: nonfocal, well oriented, appropriate affect Breasts: The right breast is undergone lumpectomy and radiation with no evidence of local  recurrence. The left breast is benign. Both axillae are benign.  LAB RESULTS:  CMP     Component Value Date/Time   NA 143 02/12/2017 1342   NA 142 05/10/2016 0953   K 3.8 02/12/2017 1342   K 4.2 05/10/2016 0953   CL 102 02/12/2017 1342   CL 106 06/01/2013 1045   CO2 28 11/14/2016 0926   CO2 27 05/10/2016 0953   GLUCOSE 101 (H) 02/12/2017 1342   GLUCOSE 106 05/10/2016 0953   GLUCOSE 94 06/01/2013 1045   BUN 9 02/12/2017 1342   BUN 13.3 05/10/2016 0953   CREATININE 0.60 02/12/2017 1342   CREATININE 0.51 11/14/2016 0926   CREATININE 0.7 05/10/2016 0953   CALCIUM 10.0 11/14/2016 0926   CALCIUM 10.4 05/10/2016 0953   PROT 7.4 11/14/2016 0926   PROT 8.1 05/10/2016 0953   ALBUMIN 4.7 11/14/2016 0926   ALBUMIN 4.4 05/10/2016 0953   AST 12 11/14/2016 0926   AST 16 05/10/2016 0953   ALT 15 11/14/2016 0926   ALT 30 05/10/2016 0953   ALKPHOS 88 11/14/2016 0926   ALKPHOS 108 05/10/2016 0953   BILITOT 0.4 11/14/2016 0926   BILITOT 0.54 05/10/2016 0953   GFRNONAA >89 11/14/2016 0926   GFRAA >89 11/14/2016 0926     I No results found for: SPEP  Lab Results  Component Value Date   WBC 8.4 06/04/2017   NEUTROABS 4.8 06/04/2017   HGB 12.7 06/04/2017   HCT 39.5 06/04/2017   MCV 81.2 06/04/2017   PLT 228 06/04/2017      Chemistry      Component Value Date/Time   NA 143 02/12/2017 1342   NA 142 05/10/2016 0953   K 3.8 02/12/2017 1342   K 4.2 05/10/2016 0953   CL 102 02/12/2017 1342   CL 106 06/01/2013 1045   CO2 28 11/14/2016 0926   CO2 27 05/10/2016 0953   BUN 9 02/12/2017 1342   BUN 13.3 05/10/2016 0953   CREATININE 0.60 02/12/2017 1342   CREATININE 0.51 11/14/2016 0926   CREATININE 0.7 05/10/2016 0953      Component Value Date/Time   CALCIUM 10.0 11/14/2016 0926   CALCIUM 10.4 05/10/2016 0953   ALKPHOS 88 11/14/2016 0926   ALKPHOS 108 05/10/2016 0953   AST 12 11/14/2016 0926   AST 16 05/10/2016 0953   ALT 15 11/14/2016 0926   ALT 30 05/10/2016 0953   BILITOT 0.4 11/14/2016 0926   BILITOT 0.54 05/10/2016 0953       Lab Results  Component Value Date   LABCA2 38 12/17/2012    No components found for: [id number]  No results for input(s): INR in the last 168 hours.  Urinalysis    Component Value Date/Time   COLORURINE YELLOW 02/24/2013 1856   APPEARANCEUR CLEAR 02/24/2013 1856   LABSPEC 1.025 02/12/2017 1248   PHURINE 5.5 02/12/2017 1248   GLUCOSEU NEGATIVE 02/12/2017 1248   HGBUR NEGATIVE 02/12/2017 1248   BILIRUBINUR NEGATIVE 02/12/2017 1248   BILIRUBINUR Negative 08/17/2015 1454   KETONESUR NEGATIVE 02/12/2017 1248   PROTEINUR NEGATIVE 02/12/2017 1248   UROBILINOGEN 2.0 (H) 02/12/2017 1248   NITRITE NEGATIVE 02/12/2017 1248   LEUKOCYTESUR TRACE (A) 02/12/2017 1248    STUDIES:     CLINICAL DATA: 56 year old with malignant lumpectomy of the upper outer quadrant of the right breast in January, 2014, pathology 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 Adjuvant chemotherapy and radiation  therapy. Annual evaluation.  EXAM: DIGITAL DIAGNOSTIC BILATERAL MAMMOGRAM WITH 3D TOMOSYNTHESIS AND CAD  COMPARISON: 11/26/2014 and earlier.  ACR Breast Density Category b: There are scattered areas of fibroglandular density.  FINDINGS: CC and MLO views of both breasts with tomosynthesis and a spot magnification tangential view of the lumpectomy site in the right breast were obtained. Scarring at the lumpectomy site in the upper outer right breast with further retraction of the scar. Residual mild post radiation trabecular thickening and skin thickening involving the right breast. No new or suspicious findings in the right breast.  No findings suspicious for malignancy in the left breast.  Mammographic images were processed with CAD.  IMPRESSION: No mammographic evidence of malignancy involving either breast. Expected post lumpectomy and post radiation changes in the right breast.  RECOMMENDATION: Diagnostic bilateral mammogram is suggested in 1 year. (Code:[id number]-01Y)  I have discussed the findings and recommendations with the patient. Results were also provided in writing at the conclusion of the visit. If applicable, a reminder letter will be sent to the patient regarding the next appointment.  BI-RADS CATEGORY 2: Benign.   Electronically Signed  By: Evangeline Dakin M.D.  On: 11/29/2015 09:59   ASSESSMENT: 56 y.o. [place] woman  (1) status post right lumpectomy and sentinel lymph node sampling 12/31/2012 for a pT1c pN0, stage IA invasive ductal carcinoma, grade 3, estrogen and progesterone receptor negative, with an MIB-1 of 83%, and HER-2 amplified, with the signals ratio of 4.36  (2) adjuvant chemotherapy consisted of docetaxel and  carboplatin  given every 21 days x4 of 6 planned cycles, completed 03/30/2013; discontinued because of neuropathy, and given with concurrent  trastuzumab ,continued until  11/16/2013, discontinued because of a drop in  her  ejection fraction, which resolved on repeat 03/01/2014.  (3) received adjuvant radiation therapy from 05/13/2013 through 07/10/201   (4) other problems include neuropathy, anxiety, a history of arthralgias and myalgias  (5) Iron deficiency anemia diagnosed on the basis of a low MCV, with history of prior normal MCV  PLAN: Kristen is now 4-1/2 years out from definitive surgery with no evidence of disease recurrence if his very favorable.  We discussed the fact that estrogen receptor negative tumors, if you're going to recur, tend to occur early. Accordingly at this point I'm comfortable releasing her to her memory care physician for further follow-up.  We'll she will need in terms of breast cancer screening is yearly mammography and a yearly physician breast exam.  Of course I will be glad to see Michalina again at any point in the future if on when the need arises, but as of now are making a further routine appointment for her here.  Chauncey Cruel, MD   06/04/2017 11:38 AM

## 2017-06-11 ENCOUNTER — Other Ambulatory Visit: Payer: Self-pay | Admitting: Family Medicine

## 2017-06-11 DIAGNOSIS — E785 Hyperlipidemia, unspecified: Secondary | ICD-10-CM

## 2017-06-11 DIAGNOSIS — I10 Essential (primary) hypertension: Secondary | ICD-10-CM

## 2017-06-19 ENCOUNTER — Other Ambulatory Visit: Payer: Self-pay | Admitting: Family Medicine

## 2017-06-19 DIAGNOSIS — F411 Generalized anxiety disorder: Secondary | ICD-10-CM

## 2017-06-21 ENCOUNTER — Other Ambulatory Visit: Payer: Self-pay | Admitting: Family Medicine

## 2017-06-21 DIAGNOSIS — F411 Generalized anxiety disorder: Secondary | ICD-10-CM

## 2017-08-17 ENCOUNTER — Other Ambulatory Visit: Payer: Self-pay | Admitting: Family Medicine

## 2017-08-17 DIAGNOSIS — F411 Generalized anxiety disorder: Secondary | ICD-10-CM

## 2017-08-19 NOTE — Telephone Encounter (Signed)
ABSOLUTELY NO FURTHER REFILLS OF BUSPAR UNTIL PATIENT HAS OV WITH DR. Raliegh Scarlet.  Staff message sent to Annabell Sabal to call patient to schedule OV.  [name], CMA

## 2017-08-20 ENCOUNTER — Telehealth: Payer: Self-pay | Admitting: Family Medicine

## 2017-08-20 NOTE — Telephone Encounter (Signed)
patient made appt on 9/10 for 11/11/17 does it need to be a sooner date?. --glh

## 2017-08-20 NOTE — Telephone Encounter (Signed)
YES.  Pt was given 15 day supply of medication and must be seen prior to any further refills.  [name], CMA

## 2017-09-02 ENCOUNTER — Other Ambulatory Visit: Payer: Self-pay | Admitting: Family Medicine

## 2017-09-02 DIAGNOSIS — F411 Generalized anxiety disorder: Secondary | ICD-10-CM

## 2017-09-07 ENCOUNTER — Other Ambulatory Visit: Payer: Self-pay | Admitting: Family Medicine

## 2017-09-07 DIAGNOSIS — I10 Essential (primary) hypertension: Secondary | ICD-10-CM

## 2017-09-07 DIAGNOSIS — E785 Hyperlipidemia, unspecified: Secondary | ICD-10-CM

## 2017-09-09 ENCOUNTER — Telehealth: Payer: Self-pay | Admitting: Family Medicine

## 2017-09-09 ENCOUNTER — Other Ambulatory Visit: Payer: Self-pay | Admitting: Family Medicine

## 2017-09-09 DIAGNOSIS — E785 Hyperlipidemia, unspecified: Secondary | ICD-10-CM

## 2017-09-09 DIAGNOSIS — K219 Gastro-esophageal reflux disease without esophagitis: Secondary | ICD-10-CM

## 2017-09-09 DIAGNOSIS — I10 Essential (primary) hypertension: Secondary | ICD-10-CM

## 2017-09-09 NOTE — Telephone Encounter (Signed)
Patient cld back states she will not be scheduling an appointment until someone calls to tell her why she now needs a Appt every (3) months for Rx refills.-- advised would forward message to MA /provider for follow-up. --glh

## 2017-09-09 NOTE — Telephone Encounter (Signed)
Cld listed ph# [phone] left VM for pt to call office for provider required OV for any Rx refill.--glh

## 2017-09-10 NOTE — Telephone Encounter (Signed)
LVM for pt to call to discuss.  T. Nelson, CMA  

## 2017-09-11 NOTE — Telephone Encounter (Signed)
Pt was to have followed up in July for evaluation of effectiveness of Buspirone, however pt states that she decided not to take the medication.  Refills sent for pt in September for other medications were only done for 15 days supply, advising that pt needed a follow up appt.  Pt has scheduled an appt for 12/18 but would 3 month supply of medications sent to pharmacy so that she has enough until her appt.  Please review and authorize if appropriate.  [name], CMA

## 2017-09-11 NOTE — Addendum Note (Signed)
Addended by: Fonnie Mu on: 09/11/2017 03:23 PM   Modules accepted: Orders

## 2017-09-11 NOTE — Telephone Encounter (Signed)
Patient needs a follow-up appointment as we discussed in length at several of her office visits so that we can monitor her multiple medical problems and address her concerns.  If you read my last note in [facility] we'll see exactly when she was told to follow-up.  She was put on new medicine in June and told to follow-up in 6 weeks.  Patient was lost to follow-up.    Needs an office visit -  no refills to be dispensed.

## 2017-09-12 NOTE — Telephone Encounter (Signed)
LVM for pt to call to discuss.  T. Nelson, CMA  

## 2017-09-16 ENCOUNTER — Telehealth: Payer: Self-pay | Admitting: Family Medicine

## 2017-09-16 NOTE — Telephone Encounter (Signed)
Pt called stating that she is very unhappy with Dr. Hershal Coria decision to not refill her blood pressure medication until she is seen in December.  Pt wishes to speak with manager regarding her dissatisfaction.  Provided patient with Abigail Butts Blum's contact information.  [name], CMA

## 2017-09-16 NOTE — Telephone Encounter (Signed)
MyChart message sent to pt.  T. Javonna Balli, CMA 

## 2017-10-04 ENCOUNTER — Other Ambulatory Visit: Payer: Self-pay | Admitting: Family Medicine

## 2017-10-04 DIAGNOSIS — I10 Essential (primary) hypertension: Secondary | ICD-10-CM

## 2017-10-15 ENCOUNTER — Other Ambulatory Visit: Payer: Self-pay | Admitting: Obstetrics and Gynecology

## 2017-10-15 DIAGNOSIS — Z853 Personal history of malignant neoplasm of breast: Secondary | ICD-10-CM

## 2017-11-11 ENCOUNTER — Ambulatory Visit: Payer: BLUE CROSS/BLUE SHIELD | Admitting: Family Medicine

## 2017-11-11 ENCOUNTER — Encounter: Payer: Self-pay | Admitting: Family Medicine

## 2017-11-11 VITALS — BP 136/93 | HR 85 | Temp 99.2°F | Ht 66.0 in | Wt 239.1 lb

## 2017-11-11 DIAGNOSIS — E785 Hyperlipidemia, unspecified: Secondary | ICD-10-CM

## 2017-11-11 DIAGNOSIS — D508 Other iron deficiency anemias: Secondary | ICD-10-CM

## 2017-11-11 DIAGNOSIS — G629 Polyneuropathy, unspecified: Secondary | ICD-10-CM

## 2017-11-11 DIAGNOSIS — Z23 Encounter for immunization: Secondary | ICD-10-CM

## 2017-11-11 DIAGNOSIS — C50411 Malignant neoplasm of upper-outer quadrant of right female breast: Secondary | ICD-10-CM

## 2017-11-11 DIAGNOSIS — K219 Gastro-esophageal reflux disease without esophagitis: Secondary | ICD-10-CM

## 2017-11-11 DIAGNOSIS — I10 Essential (primary) hypertension: Secondary | ICD-10-CM | POA: Diagnosis not present

## 2017-11-11 DIAGNOSIS — F419 Anxiety disorder, unspecified: Secondary | ICD-10-CM

## 2017-11-11 DIAGNOSIS — Z171 Estrogen receptor negative status [ER-]: Secondary | ICD-10-CM

## 2017-11-11 MED ORDER — CARVEDILOL 12.5 MG PO TABS: 12.5000 mg | ORAL_TABLET | Freq: Two times a day (BID) | ORAL | 1 refills | Status: DC

## 2017-11-11 MED ORDER — ROSUVASTATIN CALCIUM 5 MG PO TABS: 5.0000 mg | ORAL_TABLET | Freq: Every day | ORAL | 0 refills | Status: DC

## 2017-11-11 MED ORDER — OMEPRAZOLE 20 MG PO CPDR: 20.0000 mg | DELAYED_RELEASE_CAPSULE | Freq: Every day | ORAL | 1 refills | Status: DC

## 2017-11-11 MED ORDER — CARVEDILOL 6.25 MG PO TABS: 6.2500 mg | ORAL_TABLET | Freq: Two times a day (BID) | ORAL | 1 refills | Status: DC

## 2017-11-11 MED ORDER — LORAZEPAM 0.5 MG PO TABS: 0.5000 mg | ORAL_TABLET | Freq: Every day | ORAL | 0 refills | Status: DC | PRN

## 2017-11-11 MED ORDER — ROSUVASTATIN CALCIUM 5 MG PO TABS: 5.0000 mg | ORAL_TABLET | Freq: Every day | ORAL | 1 refills | Status: DC

## 2017-11-11 NOTE — Progress Notes (Signed)
Impression and Recommendations:    1. Essential hypertension   2. Hyperlipidemia, unspecified hyperlipidemia type   3. Malignant neoplasm of upper-outer quadrant of right breast in female, estrogen receptor negative (HCC)   4. Other iron deficiency anemia   5. Gastroesophageal reflux disease, esophagitis presence not specified   6. Anxiety   7. 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 Flu vaccine need     No problem-specific Assessment & Plan notes found for this encounter.    Education and routine counseling performed. Handouts provided.   New Prescriptions   OMEPRAZOLE (PRILOSEC) 20 MG CAPSULE    TAKE 1 CAPSULE BY MOUTH DAILY.   ROSUVASTATIN (CRESTOR) 5 MG TABLET    TAKE 1 TABLET BY MOUTH DAILY.    Orders Placed This Encounter  Procedures  . Flu Vaccine QUAD 6+ mos PF IM (Fluarix Quad PF)  . CBC with Differential/Platelet  . Comprehensive metabolic panel  . Lipid panel     Return for 72mo- needs reck Bp since inc coreg, .  The patient was counseled, risk factors were discussed, anticipatory guidance given.  Pt was in the office today for 40+ minutes, with over 50% time spent in face to face counseling of patients various medical conditions, treatment plans of those medical conditions including medicine management and lifestyle modification, strategies to improve health and well being; and in coordination of care. SEE ABOVE FOR DETAILS  Gross side effects, risk and benefits, and alternatives of medications discussed with patient.  Patient is aware that all medications have potential side effects and we are unable to predict every side effect or drug-drug interaction that may occur.  Expresses verbal understanding and consents to current therapy plan and treatment regimen.  Please see AVS handed out to patient at the end of our visit for further patient instructions/ counseling done pertaining to today's office visit.    Note: This document  was prepared using Dragon voice recognition software and may include unintentional dictation errors.     Subjective:    Chief Complaint  Patient presents with  . Follow-up    HPI: Julia Zuniga is a 56 y.o. female who presents to [place] at [facility] today for follow up for HTN, Obesity and anxiety, insomnia.     Dr. Vira Browns released pt re: breast CA R breast-we will undergo normal yearly mammograms from now on.  Will follow up with PCP no need to follow-up with oncology any longer.  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  advil prn if needed    Obesity: Patient would not like to discuss this in the future.  She knows what she needs to do and goes through cycles where she is good and where she struggles just like [place] all   Trouble falling asleep---->  Is improved from prior.  Not drinking later in evening, one dog out of bed.     Trouble with anxiety-->  takes ativan 1-2 times daily most days, occ takes 3 /d or not at all.  Been on med for yrs and yrs-  49 or more.   Patient states she's not going on an SSRI because they re antidepressants.  She is very adamant about this today. She has never been on ny other medicines for her nxiety.Marland Kitchen      HTN: -  Her blood  pressure been checking at home.   Bp at home running 127/86, P 80; 139/91, P-85 - Patient reports good compliance with blood pressure medications - Denies medication S-E - Smoking Status noted  - She denies new onset of: chest pain, exercise intolerance, shortness of breath, dizziness, visual changes, headache, lower extremity swelling or claudication.  Today their BP is BP: (!) 136/93  - Last 3 blood pressure readings in our office are as follows: BP Readings from Last 3 Encounters:  07/15/18 120/83  02/26/18 127/86  11/11/17 (!) 136/93    Pulse Readings from Last 3 Encounters:  07/15/18 68    02/26/18 70  11/11/17 85    Filed Weights   11/11/17 0902  Weight: 239 lb 1.6 oz (108.5 kg)      Patient Care Team    Relationship Specialty Notifications Start End  Mellody Dance, DO PCP - General Family Medicine  06/05/16   Magrinat, Virgie Dad, MD Consulting Physician Oncology  15-Dec-2016   Molli Posey, MD Consulting Physician Obstetrics and Gynecology  11/11/17      Lab Results  Component Value Date   CREATININE 0.46 (L) 11/11/2017   BUN 7 11/11/2017   NA 140 11/11/2017   K 4.5 11/11/2017   CL 102 11/11/2017   CO2 22 11/11/2017    Lab Results  Component Value Date   CHOL 167 11/11/2017   CHOL 157 11/14/2016   CHOL 152 05/15/2016    Lab Results  Component Value Date   HDL 49 11/11/2017   HDL 53 11/14/2016   HDL 47 05/15/2016    Lab Results  Component Value Date   LDLCALC 94 11/11/2017   LDLCALC 80 11/14/2016   LDLCALC 82 05/15/2016    Lab Results  Component Value Date   TRIG 120 11/11/2017   TRIG 120 11/14/2016   TRIG 114 05/15/2016    Lab Results  Component Value Date   CHOLHDL 3.4 11/11/2017   CHOLHDL 3.0 11/14/2016   CHOLHDL 3.2 05/15/2016    No results found for: LDLDIRECT ===================================================================   Patient Active Problem List   Diagnosis Date Noted  . BMI 38.0-38.9,adult 11/14/2016    Priority: High  . 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 Essential hypertension-  dx by Cards Jan '14 01/07/2013    Priority: High  . Hyperlipidemia- dx by Cards Jan '14 01/07/2013    Priority: High  . Malignant neoplasm of upper-outer quadrant of right breast in female, estrogen receptor negative ([place]) 12/12/2012    Priority: High  . 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 GERD (gastroesophageal reflux disease) 08/11/2014    Priority: Medium  . Anxiety 05/29/2014    Priority: Medium  .  Family history of stomach cancer- dad died age 54 2016/12/15    Priority: Low  . S/P chemotherapy, time since greater than 12 weeks 11/14/2016    Priority: Low  . Fatigue 08/31/2015    Priority: Low  . GAD (generalized anxiety disorder) 05/22/2017  . Heel pain 07/05/2016     Past Medical History:  Diagnosis Date  . Anemia   . Anxiety   . Breast cancer ([place]) 12/08/12   Right  . Diverticulitis    2012  . Diverticulosis   . GERD (gastroesophageal reflux disease)   . Heartburn   . 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 History of radiation  therapy 05/13/13-06/18/13   right breast, 5000 cGy 25 sessions  . Hot flashes   . Hyperlipidemia   . Hypertension   . Personal history of chemotherapy   . Personal history of radiation therapy   . PONV (postoperative nausea and vomiting)      Past Surgical History:  Procedure Laterality Date  . BREAST BIOPSY    . BREAST LUMPECTOMY    . BREAST LUMPECTOMY WITH SENTINEL LYMPH NODE BIOPSY  12/31/2012   Procedure: BREAST LUMPECTOMY WITH SENTINEL LYMPH NODE BX;  Surgeon: Stark Klein, MD;  Location: [facility];  Service: General;  Laterality: Right;  . CESAREAN SECTION     x 3  . CHOLECYSTECTOMY  2001   lap choli  . PORT-A-CATH REMOVAL Left 02/24/2014   Procedure: REMOVAL PORT-A-CATH;  Surgeon: Stark Klein, MD;  Location: [facility];  Service: General;  Laterality: Left;  . PORTACATH PLACEMENT  12/31/2012   Procedure: INSERTION PORT-A-CATH;  Surgeon: Stark Klein, MD;  Location: [facility];  Service: General;  Laterality: Left;  Left Subclavian Vein  . RE-EXCISION OF BREAST CANCER,SUPERIOR MARGINS  01/14/2013   Procedure: RE-EXCISION OF BREAST CANCER,SUPERIOR MARGINS;  Surgeon: Stark Klein, MD;  Location: [facility];  Service: General;  Laterality: Right;  right breast re excision for markers   . TONSILLECTOMY AND ADENOIDECTOMY    . TUBAL LIGATION       Family History  Problem  Relation Age of Onset  . Stomach cancer Father   . Lung cancer Maternal Uncle   . Skin cancer Maternal Grandmother   . Lung cancer Maternal Grandfather   . Lymphoma Cousin   . Colon cancer Neg Hx   . Esophageal cancer Neg Hx   . Rectal cancer Neg Hx      Social History   Substance and Sexual Activity  Drug Use No  ,  Social History   Substance and Sexual Activity  Alcohol Use Yes  . Alcohol/week: 0.0 oz   Comment: occ  ,  Social History   Tobacco Use  Smoking Status Never Smoker  Smokeless Tobacco Never Used  ,    Current Outpatient Medications on File Prior to Visit  Medication Sig Dispense Refill  . ibuprofen (ADVIL,MOTRIN) 200 MG tablet Take 400 mg by mouth every 6 (six) hours as needed. Pain    . vitamin B-12 (CYANOCOBALAMIN) 100 MCG tablet Take 100 mcg by mouth daily.     No current facility-administered medications on file prior to visit.      Allergies  Allergen Reactions  . Amlodipine     Dizziness, lightheaded, breathing issues   . Chlorhexidine     Patient skin gets bright red with rash.      . Codeine Swelling  . Medralone [Methylprednisolone Acetate]     "coming out of skin"   . Oxycodone-Acetaminophen Other (See Comments)    Face got red and hot     Review of Systems:   General:  Denies fever, chills Optho/Auditory:   Denies visual changes, blurred vision Respiratory:   Denies SOB, cough, wheeze, DIB  Cardiovascular:   Denies chest pain, palpitations, painful respirations Gastrointestinal:   Denies nausea, vomiting, diarrhea.  Endocrine:     Denies new hot or cold intolerance Musculoskeletal:  Denies joint swelling, gait issues, or new unexplained myalgias/ arthralgias Skin:  Denies rash, suspicious lesions  Neurological:    Denies dizziness, unexplained weakness, numbness  Psychiatric/Behavioral:   Denies mood changes  Objective:  Blood pressure (!) 136/93, pulse 85, temperature 99.2 F (37.3 C), height 5\' 6"  (1.676 m), weight  239 lb 1.6 oz (108.5 kg), last menstrual period 06/24/2009, SpO2 98 %.  Body mass index is 38.59 kg/m.  General: Well Developed, well nourished, and in no acute distress.  HEENT: Normocephalic, atraumatic, pupils equal round reactive to light, neck supple, No carotid bruits, no JVD Skin: Warm and dry, cap RF less 2 sec Cardiac: Regular rate and rhythm, S1, S2 WNL's, no murmurs rubs or gallops Respiratory: ECTA B/L, Not using accessory muscles, speaking in full sentences. NeuroM-Sk: Ambulates w/o assistance, moves ext * 4 w/o difficulty, sensation grossly intact.  Ext: scant edema b/l lower ext Psych: No HI/SI, judgement and insight good, Euthymic mood. Full Affect.

## 2017-11-12 ENCOUNTER — Other Ambulatory Visit: Payer: Self-pay

## 2017-11-12 ENCOUNTER — Telehealth: Payer: Self-pay | Admitting: Family Medicine

## 2017-11-12 LAB — COMPREHENSIVE METABOLIC PANEL
ALBUMIN: 4.7 g/dL (ref 3.5–5.5)
ALT: 17 IU/L (ref 0–32)
AST: 17 IU/L (ref 0–40)
Albumin/Globulin Ratio: 1.7 (ref 1.2–2.2)
Alkaline Phosphatase: 107 IU/L (ref 39–117)
BUN / CREAT RATIO: 15 (ref 9–23)
BUN: 7 mg/dL (ref 6–24)
Bilirubin Total: 0.2 mg/dL (ref 0.0–1.2)
CALCIUM: 9.8 mg/dL (ref 8.7–10.2)
CO2: 22 mmol/L (ref 20–29)
CREATININE: 0.46 mg/dL — AB (ref 0.57–1.00)
Chloride: 102 mmol/L (ref 96–106)
GFR calc Af Amer: 130 mL/min/{1.73_m2} (ref >59)
GFR, EST NON AFRICAN AMERICAN: 112 mL/min/{1.73_m2} (ref >59)
GLOBULIN, TOTAL: 2.8 g/dL (ref 1.5–4.5)
GLUCOSE: 98 mg/dL (ref 65–99)
Potassium: 4.5 mmol/L (ref 3.5–5.2)
SODIUM: 140 mmol/L (ref 134–144)
TOTAL PROTEIN: 7.5 g/dL (ref 6.0–8.5)

## 2017-11-12 LAB — CBC WITH DIFFERENTIAL/PLATELET
BASOS ABS: 0.1 10*3/uL (ref 0.0–0.2)
Basos: 1 %
EOS (ABSOLUTE): 0.2 10*3/uL (ref 0.0–0.4)
EOS: 2 %
HEMATOCRIT: 38.9 % (ref 34.0–46.6)
HEMOGLOBIN: 12.8 g/dL (ref 11.1–15.9)
IMMATURE GRANS (ABS): 0 10*3/uL (ref 0.0–0.1)
IMMATURE GRANULOCYTES: 0 %
LYMPHS: 31 %
Lymphocytes Absolute: 3.1 10*3/uL (ref 0.7–3.1)
MCH: 26.2 pg — ABNORMAL LOW (ref 26.6–33.0)
MCHC: 32.9 g/dL (ref 31.5–35.7)
MCV: 80 fL (ref 79–97)
MONOCYTES: 4 %
Monocytes Absolute: 0.4 10*3/uL (ref 0.1–0.9)
Neutrophils Absolute: 6.5 10*3/uL (ref 1.4–7.0)
Neutrophils: 62 %
Platelets: 262 10*3/uL (ref 150–379)
RBC: 4.89 x10E6/uL (ref 3.77–5.28)
RDW: 14 % (ref 12.3–15.4)
WBC: 10.3 10*3/uL (ref 3.4–10.8)

## 2017-11-12 LAB — LIPID PANEL
CHOL/HDL RATIO: 3.4 ratio (ref 0.0–4.4)
Cholesterol, Total: 167 mg/dL (ref 100–199)
HDL: 49 mg/dL (ref >39)
LDL CALC: 94 mg/dL (ref 0–99)
Triglycerides: 120 mg/dL (ref 0–149)
VLDL Cholesterol Cal: 24 mg/dL (ref 5–40)

## 2017-11-12 MED ORDER — CARVEDILOL 6.25 MG PO TABS: 12.5000 mg | ORAL_TABLET | Freq: Two times a day (BID) | ORAL | 1 refills | Status: DC

## 2017-11-12 NOTE — Telephone Encounter (Signed)
Called patient resent medication to pharmacy with the correct dose per change in medication dose. MPulliam, CMA/RT(R)

## 2017-11-12 NOTE — Telephone Encounter (Signed)
Resent corrected dose increase of the Carvedilol into pharmacy.

## 2017-11-12 NOTE — Telephone Encounter (Signed)
Patient picked up Rx Carvedilo / Coreg 6.25 MG tablets sent to pharmacy/[facility]-- says provider increased dosage from 6.25 MG tabs to --12 MG tablets.  Pt states did not realized error until arrived home & pharmacy will not take back (says has no problem keeping current meds but that supply will not last the 90dys if she doubles up and take (2) 6.25 MG tablets (2xs daily) to equal out to 12 MG.  Advised patient will forward message to medical assistant & request she contact patient with further instructions/ call back.  --glh

## 2017-12-05 ENCOUNTER — Ambulatory Visit
Admission: RE | Admit: 2017-12-05 | Discharge: 2017-12-05 | Disposition: A | Discharge status: Home / Self Care | Payer: BLUE CROSS/BLUE SHIELD | Source: Ambulatory Visit | Attending: Obstetrics and Gynecology | Admitting: Obstetrics and Gynecology

## 2017-12-05 DIAGNOSIS — Z853 Personal history of malignant neoplasm of breast: Secondary | ICD-10-CM

## 2017-12-05 DIAGNOSIS — R928 Other abnormal and inconclusive findings on diagnostic imaging of breast: Secondary | ICD-10-CM | POA: Diagnosis not present

## 2017-12-05 HISTORY — DX: Personal history of irradiation: Z92.3

## 2017-12-05 HISTORY — DX: Personal history of antineoplastic chemotherapy: Z92.21

## 2018-01-09 ENCOUNTER — Other Ambulatory Visit: Payer: Self-pay

## 2018-01-09 DIAGNOSIS — F419 Anxiety disorder, unspecified: Secondary | ICD-10-CM

## 2018-01-09 NOTE — Telephone Encounter (Signed)
Pharmacy sent over refill request for Lorazepam.  Patient was last seen 11/11/2017. Patient's next appointment is scheduled  for 02/26/2018.  Medication was last filled on 11/11/2017 for # 60 no refills - patient to take 1 tablet as needed for anxiety/panic attacks.  Sent request to Dr. Raliegh Scarlet for review. MPulliam, CMA/RT(R)

## 2018-01-14 ENCOUNTER — Telehealth: Payer: Self-pay | Admitting: Family Medicine

## 2018-01-14 NOTE — Telephone Encounter (Signed)
Called patient to notify, left message for patient to call the office. MPulliam, CMA/RT(R)

## 2018-01-14 NOTE — Telephone Encounter (Signed)
Called patient she is not needed a refill, this was an automated request from the pharmacy.  Patient does not take everyday. MPulliam, CMA/RT(R)

## 2018-01-14 NOTE — Telephone Encounter (Signed)
Patient rcvd a voicemail from medical assistant--glh

## 2018-01-14 NOTE — Telephone Encounter (Signed)
Spoke to patient - please see medication refill request note. MPulliam, CMA/RT(R)

## 2018-02-08 ENCOUNTER — Other Ambulatory Visit: Payer: Self-pay | Admitting: Family Medicine

## 2018-02-26 ENCOUNTER — Ambulatory Visit: Payer: BLUE CROSS/BLUE SHIELD | Admitting: Family Medicine

## 2018-02-26 ENCOUNTER — Encounter: Payer: Self-pay | Admitting: Family Medicine

## 2018-02-26 VITALS — BP 127/86 | HR 70 | Ht 66.0 in | Wt 241.0 lb

## 2018-02-26 DIAGNOSIS — C50411 Malignant neoplasm of upper-outer quadrant of right female breast: Secondary | ICD-10-CM | POA: Diagnosis not present

## 2018-02-26 DIAGNOSIS — Z171 Estrogen receptor negative status [ER-]: Secondary | ICD-10-CM

## 2018-02-26 DIAGNOSIS — D508 Other iron deficiency anemias: Secondary | ICD-10-CM | POA: Diagnosis not present

## 2018-02-26 DIAGNOSIS — I10 Essential (primary) hypertension: Secondary | ICD-10-CM

## 2018-02-26 DIAGNOSIS — E785 Hyperlipidemia, unspecified: Secondary | ICD-10-CM

## 2018-02-26 DIAGNOSIS — Z6838 Body mass index (BMI) 38.0-38.9, adult: Secondary | ICD-10-CM | POA: Diagnosis not present

## 2018-02-26 MED ORDER — CARVEDILOL 6.25 MG PO TABS: 6.2500 mg | ORAL_TABLET | Freq: Two times a day (BID) | ORAL | 1 refills | Status: DC

## 2018-02-26 MED ORDER — AMLODIPINE BESYLATE 5 MG PO TABS: 5.0000 mg | ORAL_TABLET | Freq: Every day | ORAL | 3 refills | Status: DC

## 2018-02-26 NOTE — Patient Instructions (Addendum)
Please start with a half a tablet of the amlodipine which is the new blood pressure medicine you will take in addition to the 6.25 of carvedilol twice daily.  Please check your blood pressure and our goal is to have it less than 130/80 on a regular basis.  So please be checking it at home.  If after 1 week of taking the half a tablet your blood pressure is not at goal of in the 120s over 70s regularly, and go to the full 1 tablet as written on your prescription.    Please let me know if you are having any trouble with this new medicine and please follow-up sooner than planned if you are.  I know you do not like to come to the doctors so we will not make you come back but, please, please let me know if you are having any problems or blood pressures not at goal   Check your BP randomly 1-2 times per day at the start after you are sitting quietly for 15-20 minutes feet flat on the ground did not recently have any caffeine or get an argument or etc.   Drink 1/2 wt in ounces of water per day.  120 oz water/day.     How to Take Your Blood Pressure Blood pressure is a measurement of how strongly your blood is pressing against the walls of your arteries. Arteries are blood vessels that carry blood from your heart throughout your body. Your health care provider takes your blood pressure at each office visit. You can also take your own blood pressure at home with a blood pressure machine. You may need to take your own blood pressure:  To confirm a diagnosis of high blood pressure (hypertension).  To monitor your blood pressure over time.  To make sure your blood pressure medicine is working.  Supplies needed: To take your blood pressure, you will need a blood pressure machine. You can buy a blood pressure machine, or blood pressure monitor, at most drugstores or online. There are several types of home blood pressure monitors. When choosing one, consider the following:  Choose a monitor that has an arm  cuff.  Choose a monitor that wraps snugly around your upper arm. You should be able to fit only one finger between your arm and the cuff.  Do not choose a monitor that measures your blood pressure from your wrist or finger.  Your health care provider can suggest a reliable monitor that will meet your needs. How to prepare To get the most accurate reading, avoid the following for 30 minutes before you check your blood pressure:  Drinking caffeine.  Drinking alcohol.  Eating.  Smoking.  Exercising.  Five minutes before you check your blood pressure:  Empty your bladder.  Sit quietly without talking in a dining chair, rather than in a soft couch or armchair.  How to take your blood pressure To check your blood pressure, follow the instructions in the manual that came with your blood pressure monitor. If you have a digital blood pressure monitor, the instructions may be as follows: 1. Sit up straight. 2. Place your feet on the floor. Do not cross your ankles or legs. 3. Rest your left arm at the level of your heart on a table or desk or on the arm of a chair. 4. Pull up your shirt sleeve. 5. Wrap the blood pressure cuff around the upper part of your left arm, 1 inch (2.5 cm) above your elbow. It  is best to wrap the cuff around bare skin. 6. Fit the cuff snugly around your arm. You should be able to place only one finger between the cuff and your arm. 7. Position the cord inside the groove of your elbow. 8. Press the power button. 9. Sit quietly while the cuff inflates and deflates. 10. Read the digital reading on the monitor screen and write it down (record it). 11. Wait 2-3 minutes, then repeat the steps, starting at step 1.  What does my blood pressure reading mean? A blood pressure reading consists of a higher number over a lower number. Ideally, your blood pressure should be below 120/80. The first ("top") number is called the systolic pressure. It is a measure of the  pressure in your arteries as your heart beats. The second ("bottom") number is called the diastolic pressure. It is a measure of the pressure in your arteries as the heart relaxes. Blood pressure is classified into four stages. The following are the stages for adults who do not have a short-term serious illness or a chronic condition. Systolic pressure and diastolic pressure are measured in a unit called mm Hg. Normal  Systolic pressure: below 482.  Diastolic pressure: below 80. Elevated  Systolic pressure: 707-867.  Diastolic pressure: below 80. Hypertension stage 1  Systolic pressure: 544-920.  Diastolic pressure: 10-07. Hypertension stage 2  Systolic pressure: 121 or above.  Diastolic pressure: 90 or above. You can have prehypertension or hypertension even if only the systolic or only the diastolic number in your reading is higher than normal. Follow these instructions at home:  Check your blood pressure as often as recommended by your health care provider.  Take your monitor to the next appointment with your health care provider to make sure: ? That you are using it correctly. ? That it provides accurate readings.  Be sure you understand what your goal blood pressure numbers are.  Tell your health care provider if you are having any side effects from blood pressure medicine. Contact a health care provider if:  Your blood pressure is consistently high. Get help right away if:  Your systolic blood pressure is higher than 180.  Your diastolic blood pressure is higher than 110. This information is not intended to replace advice given to you by your health care provider. Make sure you discuss any questions you have with your health care provider. Document Released: 05/04/2016 Document Revised: 07/17/2016 Document Reviewed: 05/04/2016 Elsevier Interactive Patient Education  [employer].

## 2018-02-26 NOTE — Progress Notes (Signed)
Impression and Recommendations:    1. BMI 38.0-38.9,adult   2. Malignant neoplasm of upper-outer quadrant of right breast in female, estrogen receptor negative ([place])   3. Hyperlipidemia, unspecified hyperlipidemia type   4. Other iron deficiency anemia   5. Essential hypertension-  dx by Cards Jan '14     1. BMI 38.0-38.9 -recommend pt to lose weight. -get out and exercise, now that the weather is improving. -AHA dietary and exercise guidelines discussed.  2. Malignant neoplasm of upper-outer quadrant of R breast- -continue going to yearly gynecology appointments for physical exam, or as recommended by them.   3. Hyperlipidemia -Recent FLP showed all results WNL.  -recommend daily exercise.  4. Other iron deficiency anemia-pt reports on iron supplements, told to continue.   5. Hypertension- -start Norvasc with 0.5 tablet QD, then increase this slowly over time to 1 tablet QD if BP is not at goal. -Dose change: Decrease carvedilol from 12.5mg  BID to 6.25mg  BID. Pt agreed. -Since pt reports feeling side effects of lows in the afternoon after taking her blood pressure medications, we will lower her carvedilol back to her original dose and add another medication in order to control BP and minimize side effects.  -Goal BP: have BP be consistently below 130/80 or less. -Check your blood pressure randomly 1-2 times a day and keep a log. Bring this into next OV.  -follow up sooner than planned if having side effects. -Educated pt on the many types of blood pressure medications.  -Drink adequate amounts of water, equal to half of your weight in oz per day. -Decrease salt intake.   -Pt strongly recommended to receive A1c today because she has not had a previous A1c result in office. She declined. -Pt strongly encouraged to have routine follow up every 4-6 months to monitor her BP as it is suboptimally controlled at this time and after starting new meds. Pt was inquiring about why  she had to follow up so often and is angered by the q 6 mo f/up for HTN alone.  I rec additionally for her to come in for CPE yrly to address immun/ screenings etc.      Education and routine counseling performed. Handouts provided.  Meds ordered this encounter  Medications  . carvedilol (COREG) 6.25 MG tablet    Sig: Take 1 tablet (6.25 mg total) by mouth 2 (two) times daily with a meal.    Dispense:  180 tablet    Refill:  1  . amLODipine (NORVASC) 5 MG tablet    Sig: Take 1 tablet (5 mg total) by mouth daily.    Dispense:  90 tablet    Refill:  3     The patient was counseled, risk factors were discussed, anticipatory guidance given.  Gross side effects, risk and benefits, and alternatives of medications discussed with patient.  Patient is aware that all medications have potential side effects and we are unable to predict every side effect or drug-drug interaction that may occur.  Expresses verbal understanding and consents to current therapy plan and treatment regimen.  Return in about 6 months (around 08/29/2018), or for BP, for GYn yrly appts for PExam; CPE w/ me 1/yr in add to reg OV.  Please see AVS handed out to patient at the end of our visit for further patient instructions/ counseling done pertaining to today's office visit.    Note: This document was prepared using Dragon voice recognition software and may include unintentional dictation errors.  This document serves as a record of services personally performed by Mellody Dance, DO. It was created on her behalf by Mayer Masker, a trained medical scribe. The creation of this record is based on the scribe's personal observations and the provider's statements to them.   I have reviewed the above medical documentation for accuracy and completeness and I concur.  Mellody Dance 03/03/18 5:29 PM     Subjective:    HPI: Julia Zuniga is a 57 y.o. female who presents to [place] at [facility]  today for follow up for HTN.    HTN:  -  Her blood pressure has been controlled at home.   She states they have been fine: 128/82-87; occasionally 135-140 but she will take her BP medications and it will lower. In general, it is mostly <130/80.   Pt has suspected white coat syndrome because her BP is elevated in office. She also had a few cups of coffee before she came into the office today. She also reports eating a lot of salty foods, which may drive up her BP.   - Patient reports good compliance with blood pressure medications.   She takes carvedilol BID.  - Denies medication S-E.  -She feels tired and "bottoms out" at around 2 pm after she takes her medications. She does think some of her tiredness is s/p her radiation. -She occasionally has HA's but they are not a problem.    - Smoking Status noted   - She denies new onset of: chest pain, exercise intolerance, shortness of breath, dizziness, visual changes, headache, lower extremity swelling (beyond baseline, she has chronic knee pain/injury that is causing her leg swelling) or claudication.   She has not been exercising.  Last 3 blood pressure readings in our office are as follows: BP Readings from Last 3 Encounters:  02/26/18 127/86  11/11/17 (!) 136/93  06/04/17 (!) 149/84    Pulse Readings from Last 3 Encounters:  02/26/18 70  11/11/17 85  06/04/17 98    Filed Weights   02/26/18 0857  Weight: 241 lb (109.3 kg)      Patient Care Team    Relationship Specialty Notifications Start End  Mellody Dance, DO PCP - General Family Medicine  06/05/16   Magrinat, Virgie Dad, MD Consulting Physician Oncology  12/04/16   Molli Posey, MD Consulting Physician Obstetrics and Gynecology  11/11/17      Lab Results  Component Value Date   CREATININE 0.46 (L) 11/11/2017   BUN 7 11/11/2017   NA 140 11/11/2017   K 4.5 11/11/2017   CL 102 11/11/2017   CO2 22 11/11/2017    Lab Results  Component Value Date   CHOL  167 11/11/2017   CHOL 157 11/14/2016   CHOL 152 05/15/2016    Lab Results  Component Value Date   HDL 49 11/11/2017   HDL 53 11/14/2016   HDL 47 05/15/2016    Lab Results  Component Value Date   LDLCALC 94 11/11/2017   LDLCALC 80 11/14/2016   LDLCALC 82 05/15/2016    Lab Results  Component Value Date   TRIG 120 11/11/2017   TRIG 120 11/14/2016   TRIG 114 05/15/2016    Lab Results  Component Value Date   CHOLHDL 3.4 11/11/2017   CHOLHDL 3.0 11/14/2016   CHOLHDL 3.2 05/15/2016    No results found for: LDLDIRECT ===================================================================   Patient Active Problem List   Diagnosis Date Noted  . GAD (generalized anxiety disorder)  05/22/2017  . Family history of stomach cancer- dad died age 12 01-01-2017  . 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 BMI 38.0-38.9,adult 11/14/2016  . S/P chemotherapy, time since greater than 12 weeks 11/14/2016  . Heel pain 07/05/2016  . History of Iron deficiency anemia- 2nd chemo induced Fe Def 05/10/2016  . Fatigue 08/31/2015  . GERD (gastroesophageal reflux disease) 08/11/2014  . Anxiety 05/29/2014  . Essential hypertension-  dx by Cards Jan '14 01/07/2013  . Hyperlipidemia- dx by Cards Jan '14 01/07/2013  . Malignant neoplasm of upper-outer quadrant of right breast in female, estrogen receptor negative ([place]) 12/12/2012     Past Medical History:  Diagnosis Date  . Anemia   . Anxiety   . Breast cancer ([place]) 12/08/12   Right  . Diverticulitis    2012  . Diverticulosis   . GERD (gastroesophageal reflux disease)   . Heartburn   . 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 History of radiation therapy 05/13/13-06/18/13   right breast, 5000 cGy 25 sessions  . Hot flashes   . Hyperlipidemia   . Hypertension   . Personal history of chemotherapy   . Personal history of radiation therapy   . PONV (postoperative nausea and vomiting)       Past Surgical History:  Procedure Laterality Date  . BREAST BIOPSY    . BREAST LUMPECTOMY    . BREAST LUMPECTOMY WITH SENTINEL LYMPH NODE BIOPSY  12/31/2012   Procedure: BREAST LUMPECTOMY WITH SENTINEL LYMPH NODE BX;  Surgeon: Stark Klein, MD;  Location: [facility];  Service: General;  Laterality: Right;  . CESAREAN SECTION     x 3  . CHOLECYSTECTOMY  2001   lap choli  . PORT-A-CATH REMOVAL Left 02/24/2014   Procedure: REMOVAL PORT-A-CATH;  Surgeon: Stark Klein, MD;  Location: [facility];  Service: General;  Laterality: Left;  . PORTACATH PLACEMENT  12/31/2012   Procedure: INSERTION PORT-A-CATH;  Surgeon: Stark Klein, MD;  Location: [facility];  Service: General;  Laterality: Left;  Left Subclavian Vein  . RE-EXCISION OF BREAST CANCER,SUPERIOR MARGINS  01/14/2013   Procedure: RE-EXCISION OF BREAST CANCER,SUPERIOR MARGINS;  Surgeon: Stark Klein, MD;  Location: [facility];  Service: General;  Laterality: Right;  right breast re excision for markers   . TONSILLECTOMY AND ADENOIDECTOMY    . TUBAL LIGATION       Family History  Problem Relation Age of Onset  . Stomach cancer Father   . Lung cancer Maternal Uncle   . Skin cancer Maternal Grandmother   . Lung cancer Maternal Grandfather   . Lymphoma Cousin   . Colon cancer Neg Hx   . Esophageal cancer Neg Hx   . Rectal cancer Neg Hx      Social History   Substance and Sexual Activity  Drug Use No  ,  Social History   Substance and Sexual Activity  Alcohol Use Yes  . Alcohol/week: 0.0 oz   Comment: occ  ,  Social History   Tobacco Use  Smoking Status Never Smoker  Smokeless Tobacco Never Used  ,    Current Outpatient Medications on File Prior to Visit  Medication Sig Dispense Refill  . ibuprofen (ADVIL,MOTRIN) 200 MG tablet Take 400 mg by mouth every 6 (six) hours as needed. Pain    . LORazepam (ATIVAN) 0.5 MG tablet Take 1 tablet (0.5 mg total) by mouth daily as needed  for anxiety (  only as needed panic attacks). 60 tablet 0  . omeprazole (PRILOSEC) 20 MG capsule Take 1 capsule (20 mg total) by mouth daily. 90 capsule 1  . rosuvastatin (CRESTOR) 5 MG tablet TAKE 1 TABLET BY MOUTH DAILY. 90 tablet 1  . vitamin B-12 (CYANOCOBALAMIN) 100 MCG tablet Take 100 mcg by mouth daily.     No current facility-administered medications on file prior to visit.      Allergies  Allergen Reactions  . Chlorhexidine     Patient skin gets bright red with rash.      . Codeine Swelling  . Medralone [Methylprednisolone Acetate]     "coming out of skin"   . Oxycodone-Acetaminophen Other (See Comments)    Face got red and hot     Review of Systems:   General:  Denies fever, chills Optho/Auditory:   Denies visual changes, blurred vision Respiratory:   Denies SOB, cough, wheeze, DIB  Cardiovascular:   Denies chest pain, palpitations, painful respirations Gastrointestinal:   Denies nausea, vomiting, diarrhea.  Endocrine:     Denies new hot or cold intolerance Musculoskeletal:  Denies joint swelling, gait issues, or new unexplained myalgias/ arthralgias Skin:  Denies rash, suspicious lesions  Neurological:    Denies dizziness, unexplained weakness, numbness  Psychiatric/Behavioral:   Denies mood changes  Objective:    Blood pressure 127/86, pulse 70, height 5\' 6"  (1.676 m), weight 241 lb (109.3 kg), last menstrual period 06/24/2009, SpO2 100 %.  Body mass index is 38.9 kg/m.  General: Well Developed, well nourished, and in no acute distress.  HEENT: Normocephalic, atraumatic, pupils equal round reactive to light, neck supple, No carotid bruits, no JVD Skin: Warm and dry, cap RF less 2 sec Cardiac: Regular rate and rhythm, S1, S2 WNL's, no murmurs rubs or gallops Respiratory: ECTA B/L, Not using accessory muscles, speaking in full sentences. NeuroM-Sk: Ambulates w/o assistance, moves ext * 4 w/o difficulty, sensation grossly intact.  Ext: scant edema b/l lower  ext Psych: No HI/SI, judgement and insight good, Euthymic mood. Full Affect.

## 2018-02-28 ENCOUNTER — Telehealth: Payer: Self-pay

## 2018-02-28 NOTE — Telephone Encounter (Signed)
Patient called and states that after starting the Amlodipine that was written on 02/26/2018 she started having side effects.  Patient states that on the first day she noticed that she was light headed, dizzy, and had nausea. On the Second day she is complaining of though pattern changes and speech issues such as stuttering.  Spoke with Dr. Raliegh Scarlet and informed her of the patient's symptoms and was instructed to advise the patient to discontinue medication, monitor blood pressure and if symptoms worsen over the weekend or do not improve after stopping the medication to go to the ED.  Patient is also advised to go to the ED if BP elevates above risk level or she has any chest pains at anytime.  Patient needs to follow up in the office in the near future and will call on Monday to follow up on weather symptoms have improved. MPulliam, CMA/RT(R)

## 2018-05-08 ENCOUNTER — Other Ambulatory Visit: Payer: Self-pay | Admitting: Family Medicine

## 2018-05-09 ENCOUNTER — Other Ambulatory Visit: Payer: Self-pay | Admitting: Family Medicine

## 2018-05-12 ENCOUNTER — Other Ambulatory Visit: Payer: Self-pay

## 2018-05-12 MED ORDER — CARVEDILOL 6.25 MG PO TABS: 6.2500 mg | ORAL_TABLET | Freq: Two times a day (BID) | ORAL | 1 refills | Status: DC

## 2018-05-12 NOTE — Telephone Encounter (Signed)
Refill carvedilol. MPulliam, CMA/RT(R)

## 2018-05-20 IMAGING — MG 2D DIGITAL DIAGNOSTIC BILATERAL MAMMOGRAM WITH CAD AND ADJUNCT T
8 of 13 series · 8 of 29 positions shown · non-contrast
Comparison: Previous exam(s).

ACR Breast Density Category a: The breast tissue is almost entirely
fatty.

CLINICAL DATA: History of right breast cancer in [age] status post
lumpectomy and radiation therapy.

EXAM:
2D DIGITAL DIAGNOSTIC BILATERAL MAMMOGRAM WITH CAD AND ADJUNCT TOMO

[R MLO (1 of 2)]
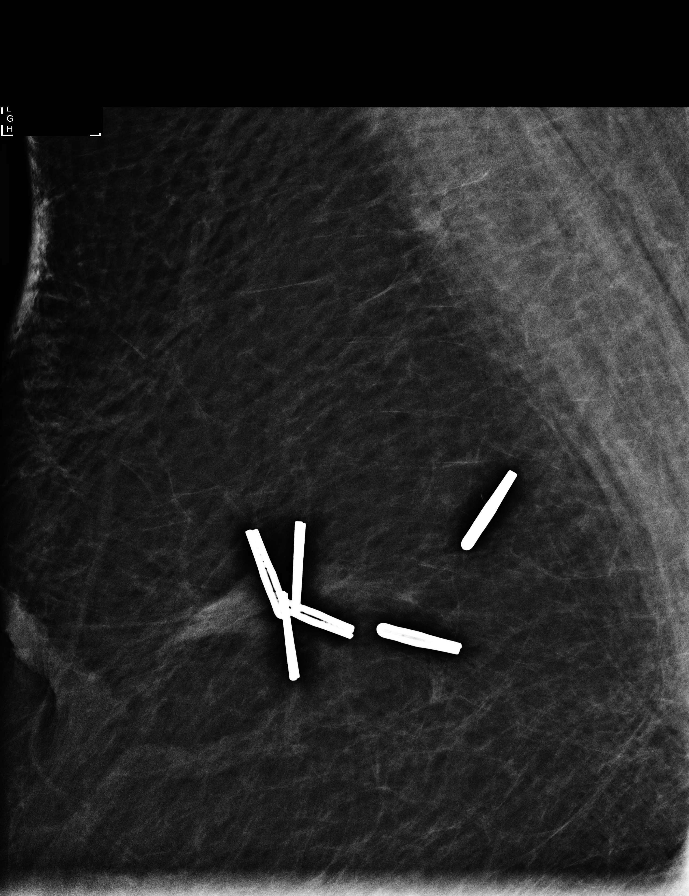

[L CC]
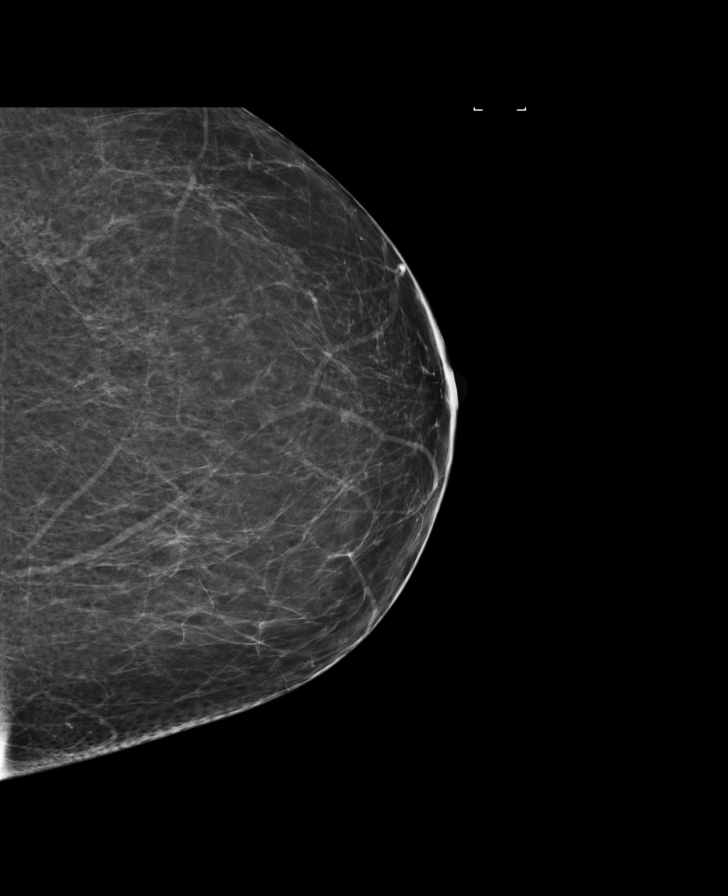

[R MLO (2 of 2)]
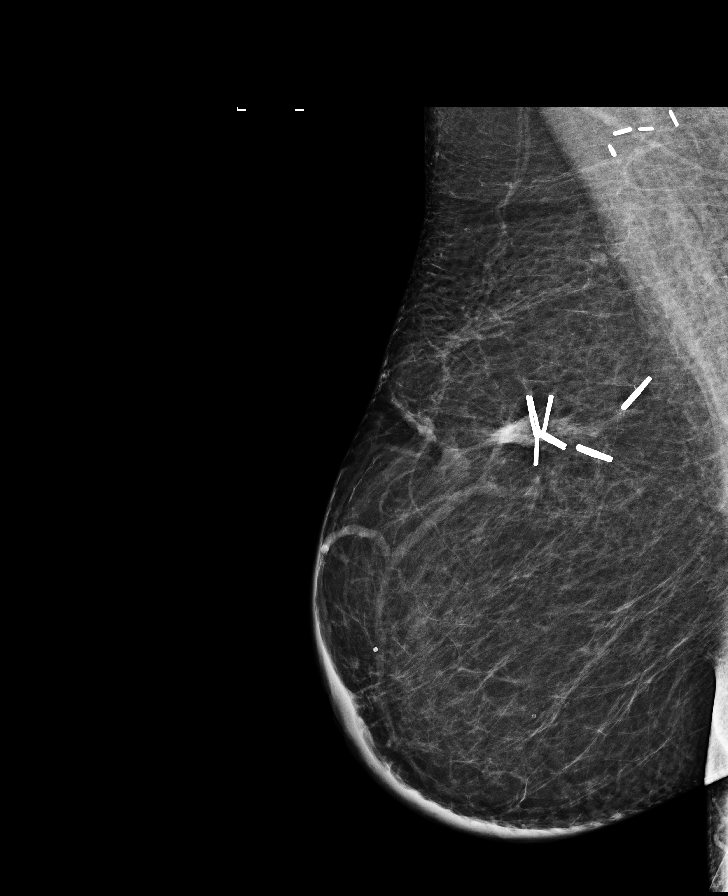

[L CC synth-2D]
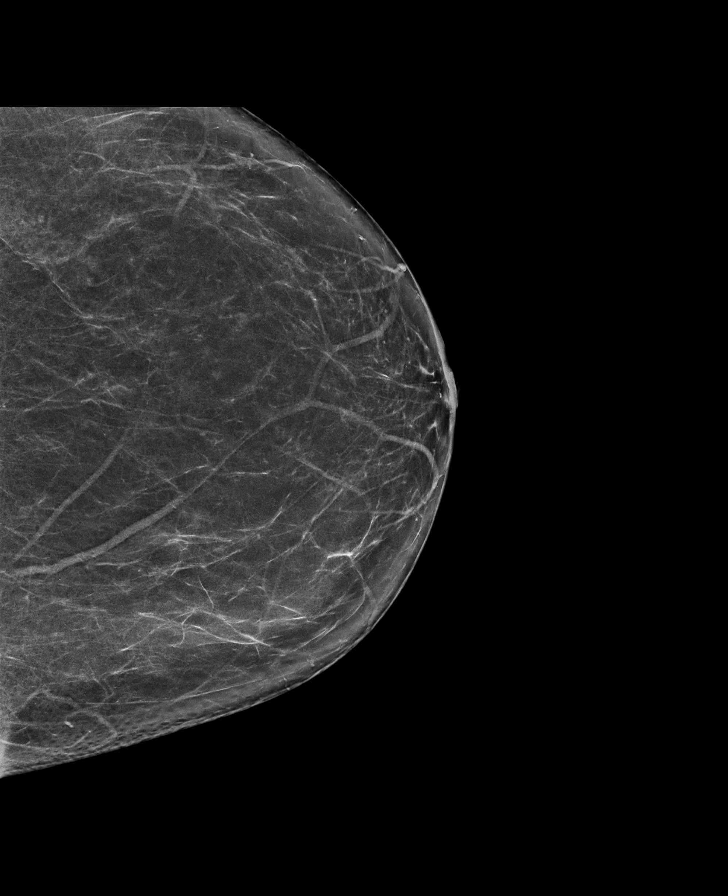

[L MLO]
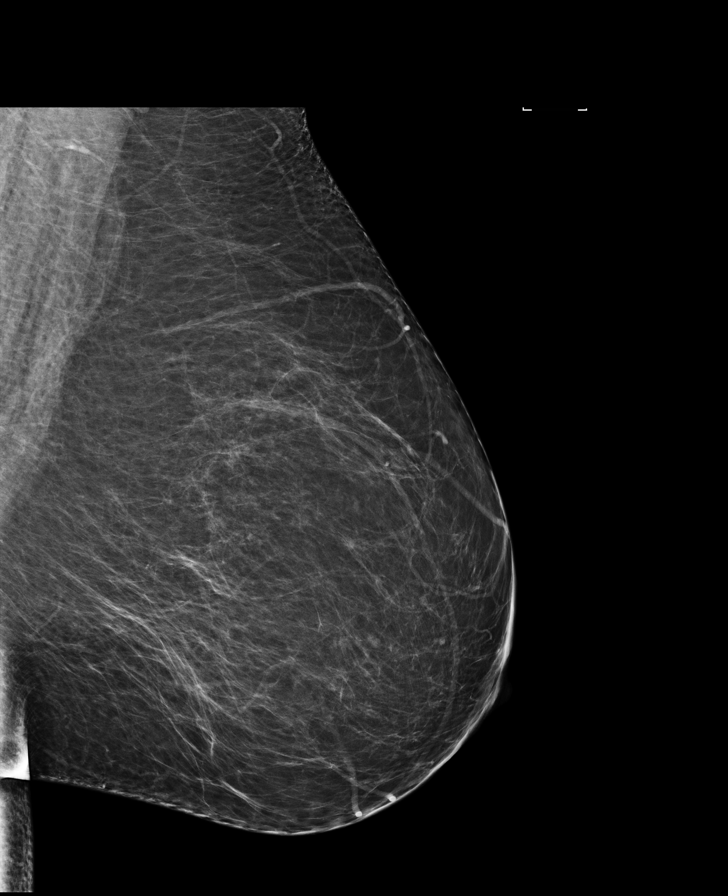

[L MLO synth-2D]
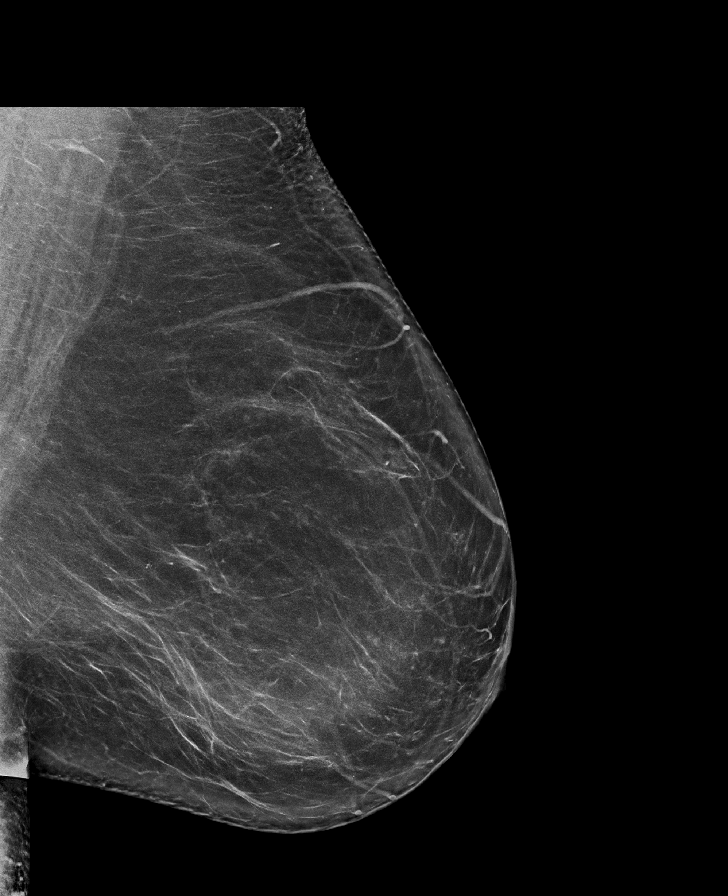

[R CC synth-2D]
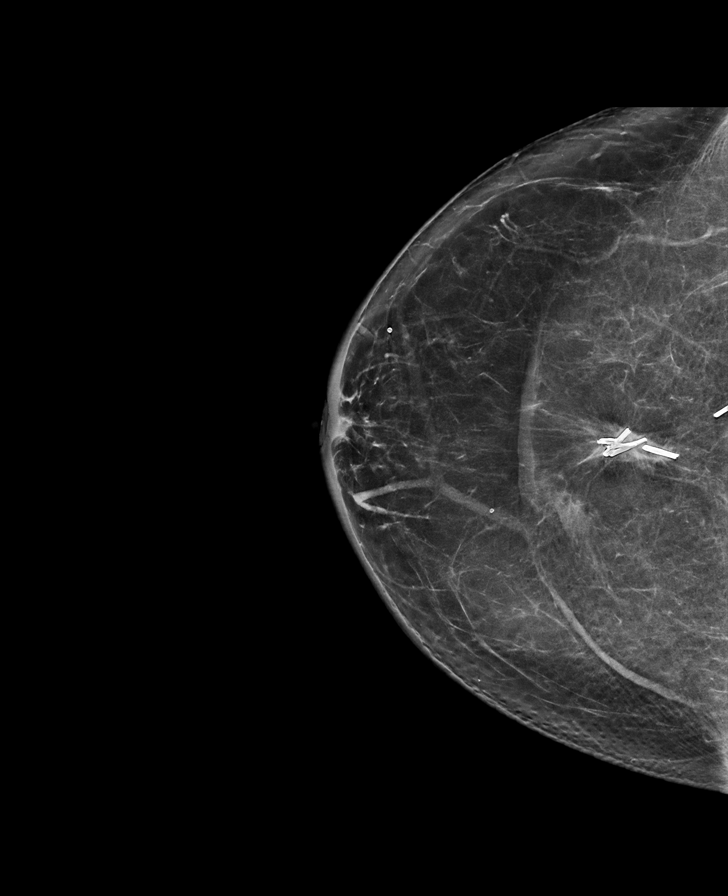

[R MLO synth-2D]
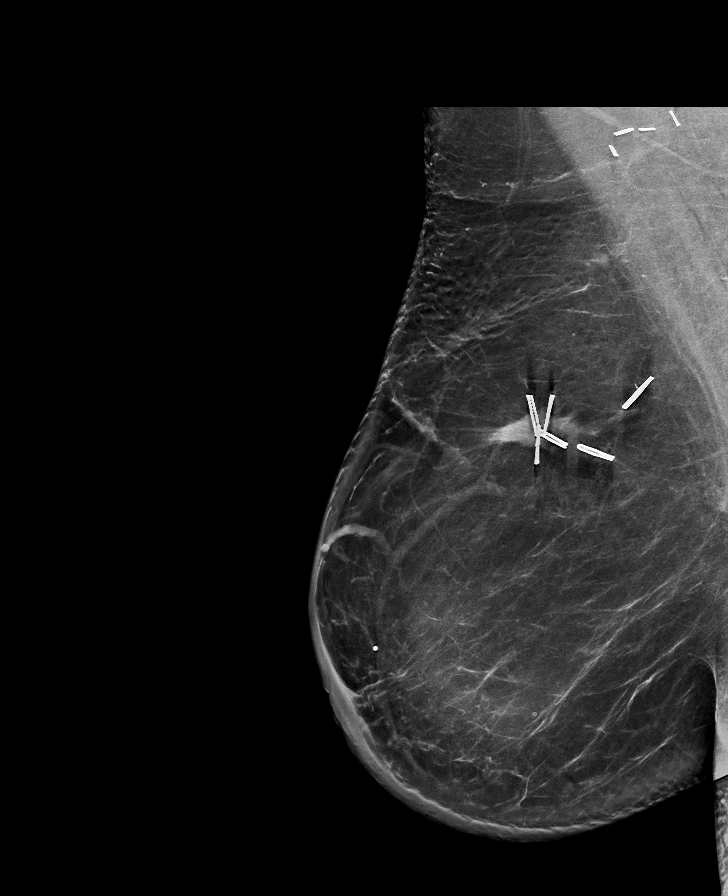

[8 of 29 positions shown; findings below may reference images not displayed]

FINDINGS: There are stable postsurgical changes within the right breast. There
are no new dominant masses, suspicious calcifications or secondary
signs of malignancy within either breast.

Mammographic images were processed with CAD.
IMPRESSION: No evidence of malignancy within either breast. Stable postsurgical
changes within the right breast.

RECOMMENDATION:
Bilateral diagnostic mammogram in 1 year.

I have discussed the findings and recommendations with the patient.
Results were also provided in writing at the conclusion of the
visit. If applicable, a reminder letter will be sent to the patient
regarding the next appointment.

BI-RADS CATEGORY  2: Benign.

## 2018-07-15 ENCOUNTER — Ambulatory Visit (INDEPENDENT_AMBULATORY_CARE_PROVIDER_SITE_OTHER): Payer: BLUE CROSS/BLUE SHIELD | Admitting: Family Medicine

## 2018-07-15 ENCOUNTER — Encounter: Payer: Self-pay | Admitting: Family Medicine

## 2018-07-15 VITALS — BP 120/83 | HR 68 | Ht 66.0 in | Wt 239.7 lb

## 2018-07-15 DIAGNOSIS — E785 Hyperlipidemia, unspecified: Secondary | ICD-10-CM | POA: Diagnosis not present

## 2018-07-15 DIAGNOSIS — I10 Essential (primary) hypertension: Secondary | ICD-10-CM | POA: Diagnosis not present

## 2018-07-15 DIAGNOSIS — M7712 Lateral epicondylitis, left elbow: Secondary | ICD-10-CM

## 2018-07-15 MED ORDER — CARVEDILOL 12.5 MG PO TABS: 12.5000 mg | ORAL_TABLET | Freq: Two times a day (BID) | ORAL | 1 refills | Status: DC

## 2018-07-15 NOTE — Patient Instructions (Signed)
You can go to the pharmacy and ask about a tennis elbow brace. Use it daily with any activity.  Ice 58min 4-5 times/ day and NSAIDS as needed moderate-severe pain.  AVOID overuse!   Tennis Elbow Tennis elbow (lateral epicondylitis) is inflammation of the outer tendons of your forearm close to your elbow. Your tendons attach your muscles to your bones. The outer tendons of your forearm are used to extend your wrist, and they attach on the outside part of your elbow. Tennis elbow is often found in people who play tennis, but anyone may get the condition from repeatedly extending the wrist or turning the forearm. What are the causes? This condition is caused by repeatedly extending your wrist and using your hands. It can result from sports or work that requires repetitive forearm movements. Tennis elbow may also be caused by an injury. What increases the risk? You have a higher risk of developing tennis elbow if you play tennis or another racquet sport. You also have a higher risk if you frequently use your hands for work. This condition is also more likely to develop in:  Musicians.  Carpenters, painters, and plumbers.  Cooks.  Cashiers.  People who work in [employer].  Architect workers.  Butchers.  People who use computers.  What are the signs or symptoms? Symptoms of this condition include:  Pain and tenderness in your forearm and the outer part of your elbow. You may only feel the pain when you use your arm, or you may feel it even when you are not using your arm.  A burning feeling that runs from your elbow through your arm.  Weak grip in your hands.  How is this diagnosed? This condition may be diagnosed by medical history and physical exam. You may also have other tests, including:  X-rays.  MRI.  How is this treated? Your health care provider will recommend lifestyle adjustments, such as resting and icing your arm. Treatment may also include:  Medicines for  inflammation. This may include shots of cortisone if your pain continues.  Physical therapy. This may include massage or exercises.  An elbow brace.  Surgery may eventually be recommended if your pain does not go away with treatment. Follow these instructions at home: Activity  Rest your elbow and wrist as directed by your health care provider. Try to avoid any activities that caused the problem until your health care provider says that you can do them again.  If a physical therapist teaches you exercises, do all of them as directed.  If you lift an object, lift it with your palm facing upward. This lowers the stress on your elbow. Lifestyle  If your tennis elbow is caused by sports, check your equipment and make sure that: ? You are using it correctly. ? It is the best fit for you.  If your tennis elbow is caused by work, take breaks frequently, if you are able. Talk with your manager about how to best perform tasks in a way that is safe. ? If your tennis elbow is caused by computer use, talk with your manager about any changes that can be made to your work environment. General instructions  If directed, apply ice to the painful area: ? Put ice in a plastic bag. ? Place a towel between your skin and the bag. ? Leave the ice on for 20 minutes, 2-3 times per day.  Take medicines only as directed by your health care provider.  If you were given a  brace, wear it as directed by your health care provider.  Keep all follow-up visits as directed by your health care provider. This is important. Contact a health care provider if:  Your pain does not get better with treatment.  Your pain gets worse.  You have numbness or weakness in your forearm, hand, or fingers. This information is not intended to replace advice given to you by your health care provider. Make sure you discuss any questions you have with your health care provider. Document Released: 11/26/2005 Document Revised:  07/26/2016 Document Reviewed: 11/22/2014 Elsevier Interactive Patient Education  [employer].

## 2018-07-15 NOTE — Progress Notes (Signed)
Impression and Recommendations:    1. Essential hypertension-  dx by Cards Jan '14   2. Hyperlipidemia, unspecified hyperlipidemia type   3. Lateral epicondylitis of left elbow     1. Blood Pressure - Sx well managed at this time. - New prescription of carvedilol called in per treatment changes discussed today. - Continue treatment as prescribed.  - Lifestyle changes such as dash diet and engaging in a regular exercise program discussed with patient.  Educational handouts provided.  - Ambulatory BP monitoring encouraged. Keep log and bring in next OV.  2. Hyperlipidemia - Continue taking Crestor nightly before bed as prescribed.  3. Anxiety - Sx managed on treatment at this time. - Strongly advised patient to continue taking Lorazepam rarely. - Educated patient on the need to take this medication as sparingly as possible.  4. GERD - Sx well managed on treatment at this time. - Advised patient that "less is better;" if she does not need medication, do not take it.  5. Lateral Epicondylitis  - Recommended use of NSAID's for relief, along with ice. - Ice the area for 15-20 minutes at a time, 4-5 times per day, or as tolerated. - Avoid aggravating activities extending and pronating the arm. - Extensively reviewed conservative treatment with patient today. - Will seek future follow-up as needed if symptoms do not improve.  6. BMI Counseling Explained to patient what BMI refers to, and what it means medically.    Told patient to think about it as a "medical risk stratification measurement" and how increasing BMI is associated with increasing risk/ or worsening state of various diseases such as hypertension, hyperlipidemia, diabetes, premature OA, depression etc.  American Heart Association guidelines for healthy diet, basically Mediterranean diet, and exercise guidelines of 30 minutes 5 days per week or more discussed in detail.  Health counseling performed.  All questions  answered.  7. Lifestyle & Preventative Health Maintenance - Advised patient to continue working toward exercising to improve overall mental, physical, and emotional health.    - Encouraged patient to engage in daily physical activity.  Recommended that the patient eventually strive for at least 150 minutes of moderate cardiovascular activity per week according to guidelines established by the [facility].   - Healthy dietary habits encouraged, including low-carb, and high amounts of lean protein in diet.   - Patient should also consume adequate amounts of water - half of body weight in oz of water per day.   Education and routine counseling performed. Handouts provided.  8. Follow-Up -   No orders of the defined types were placed in this encounter.   Meds ordered this encounter  Medications  . carvedilol (COREG) 12.5 MG tablet    Sig: Take 1 tablet (12.5 mg total) by mouth 2 (two) times daily with a meal.    Dispense:  180 tablet    Refill:  1    Medications Discontinued During This Encounter  Medication Reason  . amLODipine (NORVASC) 5 MG tablet Change in therapy  . carvedilol (COREG) 6.25 MG tablet      The patient was counseled, risk factors were discussed, anticipatory guidance given.  Gross side effects, risk and benefits, and alternatives of medications discussed with patient.  Patient is aware that all medications have potential side effects and we are unable to predict every side effect or drug-drug interaction that may occur.  Expresses verbal understanding and consents to current therapy plan and treatment regimen.  Return for f/up as planned  in a couple months.  Please see AVS handed out to patient at the end of our visit for further patient instructions/ counseling done pertaining to today's office visit.    Note:  This document was prepared using Dragon voice recognition software and may include unintentional dictation errors.   This document serves as a record of  services personally performed by Mellody Dance, DO. It was created on her behalf by Toni Amend, a trained medical scribe. The creation of this record is based on the scribe's personal observations and the provider's statements to them.   I have reviewed the above medical documentation for accuracy and completeness and I concur.  Mellody Dance 07/15/18 9:15 AM    Subjective:    HPI: Julia Zuniga is a 57 y.o. female who presents to [facility] today for follow up for HTN.    States she is here today for a blood pressure re-check.  Patient quit taking B12.  Toward end of appointment, patient becomes tearful discussing her dog's declining health and fear that her dog is near death.  Patient notes that she was going to put her dog down last week, but the vet went home early.  Hyperlipidemia Takes Crestor every night before bed.  Notes she drinks "tons" of water.  GERD Patient states that her GERD is under control at this time.  Anxiety Patient feels she as taking lorazepam maybe once monthly.  Pain in Left Arm  Notes that she has pain in her left arm, onset after keeping her grandson, taking care of him for several days, and holding him often to feed him.  HTN:  -  Her blood pressure has been controlled at home.   BP is running in 120's/low 80's at home.  Patient checks it once a week.  - Patient reports good compliance with blood pressure medications.  However, notes she did not start one of her new medications.  Patient was put on amlodipine and could not tolerate it. S-E: Had breathing issues, issues with her "thought process" and her "speech got real off."  Comments that her tolerance to medications is really weird.  - Denies medication S-E   - Smoking Status noted   - She denies new onset of: chest pain, exercise intolerance, shortness of breath, dizziness, visual changes, headache, lower extremity swelling or claudication.    Last 3 blood pressure readings in our office are as follows: BP Readings from Last 3 Encounters:  07/15/18 120/83  02/26/18 127/86  11/11/17 (!) 136/93    Pulse Readings from Last 3 Encounters:  07/15/18 68  02/26/18 70  11/11/17 85    Filed Weights   07/15/18 0846  Weight: 239 lb 11.2 oz (108.7 kg)      Patient Care Team    Relationship Specialty Notifications Start End  Mellody Dance, DO PCP - General Family Medicine  06/05/16   Magrinat, Virgie Dad, MD Consulting Physician Oncology  12/04/16   Molli Posey, MD Consulting Physician Obstetrics and Gynecology  11/11/17      Lab Results  Component Value Date   CREATININE 0.46 (L) 11/11/2017   BUN 7 11/11/2017   NA 140 11/11/2017   K 4.5 11/11/2017   CL 102 11/11/2017   CO2 22 11/11/2017    Lab Results  Component Value Date   CHOL 167 11/11/2017   CHOL 157 11/14/2016   CHOL 152 05/15/2016    Lab Results  Component Value Date   HDL 49  11/11/2017   HDL 53 11/14/2016   HDL 47 05/15/2016    Lab Results  Component Value Date   LDLCALC 94 11/11/2017   LDLCALC 80 11/14/2016   LDLCALC 82 05/15/2016    Lab Results  Component Value Date   TRIG 120 11/11/2017   TRIG 120 11/14/2016   TRIG 114 05/15/2016    Lab Results  Component Value Date   CHOLHDL 3.4 11/11/2017   CHOLHDL 3.0 11/14/2016   CHOLHDL 3.2 05/15/2016    No results found for: LDLDIRECT ===================================================================   Patient Active Problem List   Diagnosis Date Noted  . BMI 38.0-38.9,adult 11/14/2016    Priority: High  . 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 Essential hypertension-  dx by Cards Jan '14 01/07/2013    Priority: High  . Hyperlipidemia- dx by Cards Jan '14 01/07/2013    Priority: High  . Malignant neoplasm of upper-outer quadrant of right breast in female, estrogen receptor negative ([facility]) 12/12/2012    Priority: High  .  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 GERD (gastroesophageal reflux disease) 08/11/2014    Priority: Medium  . Anxiety 05/29/2014    Priority: Medium  . Family history of stomach cancer- dad died age 75 12/12/16    Priority: Low  . S/P chemotherapy, time since greater than 12 weeks 11/14/2016    Priority: Low  . Fatigue 08/31/2015    Priority: Low  . Lateral epicondylitis of left elbow 07/15/2018  . GAD (generalized anxiety disorder) 05/22/2017  . Heel pain 07/05/2016     Past Medical History:  Diagnosis Date  . Anemia   . Anxiety   . Breast cancer ([facility]) 12/08/12   Right  . Diverticulitis    2012  . Diverticulosis   . GERD (gastroesophageal reflux disease)   . Heartburn   . 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 History of radiation therapy 05/13/13-06/18/13   right breast, 5000 cGy 25 sessions  . Hot flashes   . Hyperlipidemia   . Hypertension   . Personal history of chemotherapy   . Personal history of radiation therapy   . PONV (postoperative nausea and vomiting)      Past Surgical History:  Procedure Laterality Date  . BREAST BIOPSY    . BREAST LUMPECTOMY    . BREAST LUMPECTOMY WITH SENTINEL LYMPH NODE BIOPSY  12/31/2012   Procedure: BREAST LUMPECTOMY WITH SENTINEL LYMPH NODE BX;  Surgeon: Stark Klein, MD;  Location: [facility];  Service: General;  Laterality: Right;  . CESAREAN SECTION     x 3  . CHOLECYSTECTOMY  2001   lap choli  . PORT-A-CATH REMOVAL Left 02/24/2014   Procedure: REMOVAL PORT-A-CATH;  Surgeon: Stark Klein, MD;  Location: [facility];  Service: General;  Laterality: Left;  . PORTACATH PLACEMENT  12/31/2012   Procedure: INSERTION PORT-A-CATH;  Surgeon: Stark Klein, MD;  Location: [facility];  Service: General;  Laterality: Left;  Left Subclavian Vein  . RE-EXCISION OF BREAST CANCER,SUPERIOR MARGINS  01/14/2013   Procedure:  RE-EXCISION OF BREAST CANCER,SUPERIOR MARGINS;  Surgeon: Stark Klein, MD;  Location: [facility];  Service: General;  Laterality: Right;  right breast re excision for markers   . TONSILLECTOMY AND ADENOIDECTOMY    . TUBAL LIGATION       Family History  Problem Relation Age of  Onset  . Stomach cancer Father   . Lung cancer Maternal Uncle   . Skin cancer Maternal Grandmother   . Lung cancer Maternal Grandfather   . Lymphoma Cousin   . Colon cancer Neg Hx   . Esophageal cancer Neg Hx   . Rectal cancer Neg Hx      Social History   Substance and Sexual Activity  Drug Use No  ,  Social History   Substance and Sexual Activity  Alcohol Use Yes  . Alcohol/week: 0.0 oz   Comment: occ  ,  Social History   Tobacco Use  Smoking Status Never Smoker  Smokeless Tobacco Never Used  ,    Current Outpatient Medications on File Prior to Visit  Medication Sig Dispense Refill  . ibuprofen (ADVIL,MOTRIN) 200 MG tablet Take 400 mg by mouth every 6 (six) hours as needed. Pain    . LORazepam (ATIVAN) 0.5 MG tablet Take 1 tablet (0.5 mg total) by mouth daily as needed for anxiety (only as needed panic attacks). 60 tablet 0  . omeprazole (PRILOSEC) 20 MG capsule TAKE 1 CAPSULE BY MOUTH DAILY. 90 capsule 1  . rosuvastatin (CRESTOR) 5 MG tablet TAKE 1 TABLET BY MOUTH DAILY. 90 tablet 1  . vitamin B-12 (CYANOCOBALAMIN) 100 MCG tablet Take 100 mcg by mouth daily.     No current facility-administered medications on file prior to visit.      Allergies  Allergen Reactions  . Amlodipine     Dizziness, lightheaded, breathing issues   . Chlorhexidine     Patient skin gets bright red with rash.      . Codeine Swelling  . Medralone [Methylprednisolone Acetate]     "coming out of skin"   . Oxycodone-Acetaminophen Other (See Comments)    Face got red and hot     Review of Systems:   General:  Denies fever, chills Optho/Auditory:   Denies visual changes, blurred vision Respiratory:   Denies  SOB, cough, wheeze, DIB  Cardiovascular:   Denies chest pain, palpitations, painful respirations Gastrointestinal:   Denies nausea, vomiting, diarrhea.  Endocrine:     Denies new hot or cold intolerance Musculoskeletal:  Denies joint swelling, gait issues, or new unexplained myalgias/ arthralgias Skin:  Denies rash, suspicious lesions  Neurological:    Denies dizziness, unexplained weakness, numbness  Psychiatric/Behavioral:   Denies mood changes  Objective:    Blood pressure 120/83, pulse 68, height 5\' 6"  (1.676 m), weight 239 lb 11.2 oz (108.7 kg), last menstrual period 06/24/2009, SpO2 98 %.  Body mass index is 38.69 kg/m.  General: Well Developed, well nourished, and in no acute distress.  HEENT: Normocephalic, atraumatic, pupils equal round reactive to light, neck supple, No carotid bruits, no JVD Skin: Warm and dry, cap RF less 2 sec Cardiac: Regular rate and rhythm, S1, S2 WNL's, no murmurs rubs or gallops Respiratory: ECTA B/L, Not using accessory muscles, speaking in full sentences. NeuroM-Sk: Ambulates w/o assistance, moves ext * 4 w/o difficulty, sensation grossly intact.  Ext: scant edema b/l lower ext Psych: Patient tearful while discussing her dog's health.  No HI/SI, judgement and insight good, Euthymic mood. Full Affect.

## 2018-08-07 ENCOUNTER — Other Ambulatory Visit: Payer: Self-pay | Admitting: Family Medicine

## 2018-09-09 ENCOUNTER — Telehealth: Payer: Self-pay | Admitting: Family Medicine

## 2018-09-09 ENCOUNTER — Telehealth: Payer: Self-pay | Admitting: *Deleted

## 2018-09-09 ENCOUNTER — Other Ambulatory Visit: Payer: Self-pay | Admitting: Obstetrics and Gynecology

## 2018-09-09 DIAGNOSIS — Z9889 Other specified postprocedural states: Secondary | ICD-10-CM

## 2018-09-09 NOTE — Telephone Encounter (Signed)
This RN spoke with pt per her call stating onset of pain in her right breast which started 4 days ago and has gotten progressively worse.  Julia Zuniga contacted her primary MD but was advised to see the oncology doctor.  Pt had right side breast cancer with lumpectomy approximately 5 years ago.  Julia Zuniga states the breast is tender to touch but she does not see that it is swollen nor does she palpate any lumps.  She notices the pain when she is using her arms such as dish washing.  Per above discussion- recommended for to come in to Symptom Management tomorrow due to MD and midlevel did not have any openings for evaluation for possible cellulitis and further recommendations.  Time given with pt in agreement.  Urgent LOS sent to scheduling.

## 2018-09-09 NOTE — Telephone Encounter (Signed)
Spoke to the patient, she has called oncology doctor and is still waiting on a call back.  Informed patient that per Dr. Raliegh Scarlet that this would be better followed by their office due to her history.    Patient is currently having pain in right breast, which is the one that she had surgery (lumpectomy); breast cancer 2014.  Patient states that the pain is on the top of the breast and radiates into the nipple.  Patient denies any discharge, lump/mass, or heat in the breast.    Patient advised to follow up with the oncology office first thing in the morning if she has not received a call before then.  Patient to go to the ED if pain increase or other symptoms develop. MPulliam, CMA/RT(R)

## 2018-09-09 NOTE — Telephone Encounter (Signed)
Patient is a previous breast cancer patient and over the last few days has developed a little pain and tenderness in one of her breasts and didn't know who to contact. I told her I would put this message in but with Dr. Raliegh Scarlet going out of state this week, I advised she also contact her oncologist just to see if there is anything they recommend as well. Please advise

## 2018-09-10 ENCOUNTER — Ambulatory Visit
Admission: RE | Admit: 2018-09-10 | Discharge: 2018-09-10 | Disposition: A | Discharge status: Home / Self Care | Payer: BLUE CROSS/BLUE SHIELD | Source: Ambulatory Visit | Attending: Medical | Admitting: Medical

## 2018-09-10 ENCOUNTER — Inpatient Hospital Stay: Payer: BLUE CROSS/BLUE SHIELD

## 2018-09-10 ENCOUNTER — Other Ambulatory Visit: Payer: Self-pay | Admitting: Medical

## 2018-09-10 ENCOUNTER — Telehealth: Payer: Self-pay | Admitting: Medical

## 2018-09-10 ENCOUNTER — Inpatient Hospital Stay: Payer: BLUE CROSS/BLUE SHIELD | Attending: Medical | Admitting: Medical

## 2018-09-10 VITALS — BP 158/80 | HR 74 | Temp 98.8°F | Resp 16 | Ht 66.0 in | Wt 247.1 lb

## 2018-09-10 DIAGNOSIS — R928 Other abnormal and inconclusive findings on diagnostic imaging of breast: Secondary | ICD-10-CM | POA: Diagnosis not present

## 2018-09-10 DIAGNOSIS — C50411 Malignant neoplasm of upper-outer quadrant of right female breast: Secondary | ICD-10-CM

## 2018-09-10 DIAGNOSIS — N644 Mastodynia: Secondary | ICD-10-CM

## 2018-09-10 DIAGNOSIS — I10 Essential (primary) hypertension: Secondary | ICD-10-CM | POA: Diagnosis not present

## 2018-09-10 DIAGNOSIS — Z853 Personal history of malignant neoplasm of breast: Secondary | ICD-10-CM | POA: Diagnosis not present

## 2018-09-10 DIAGNOSIS — Z171 Estrogen receptor negative status [ER-]: Secondary | ICD-10-CM

## 2018-09-10 LAB — COMPREHENSIVE METABOLIC PANEL
ALBUMIN: 4.1 g/dL (ref 3.5–5.0)
ALK PHOS: 118 U/L (ref 38–126)
ALT: 17 U/L (ref 0–44)
AST: 13 U/L — AB (ref 15–41)
Anion gap: 11 (ref 5–15)
BILIRUBIN TOTAL: 0.3 mg/dL (ref 0.3–1.2)
BUN: 10 mg/dL (ref 6–20)
CALCIUM: 9.8 mg/dL (ref 8.9–10.3)
CO2: 25 mmol/L (ref 22–32)
CREATININE: 0.66 mg/dL (ref 0.44–1.00)
Chloride: 107 mmol/L (ref 98–111)
GFR calc Af Amer: >60 mL/min (ref >60)
GFR calc non Af Amer: >60 mL/min (ref >60)
GLUCOSE: 101 mg/dL — AB (ref 70–99)
POTASSIUM: 4.2 mmol/L (ref 3.5–5.1)
Sodium: 143 mmol/L (ref 135–145)
TOTAL PROTEIN: 7.5 g/dL (ref 6.5–8.1)

## 2018-09-10 LAB — CBC WITH DIFFERENTIAL/PLATELET
BASOS ABS: 0.1 10*3/uL (ref 0.0–0.1)
Basophils Relative: 1 %
Eosinophils Absolute: 0.2 10*3/uL (ref 0.0–0.5)
Eosinophils Relative: 2 %
HEMATOCRIT: 36.9 % (ref 34.8–46.6)
HEMOGLOBIN: 12.1 g/dL (ref 11.6–15.9)
LYMPHS PCT: 33 %
Lymphs Abs: 2.9 10*3/uL (ref 0.9–3.3)
MCH: 26.7 pg (ref 25.1–34.0)
MCHC: 32.9 g/dL (ref 31.5–36.0)
MCV: 81 fL (ref 79.5–101.0)
MONO ABS: 0.5 10*3/uL (ref 0.1–0.9)
Monocytes Relative: 6 %
Neutro Abs: 5.1 10*3/uL (ref 1.5–6.5)
Neutrophils Relative %: 58 %
Platelets: 217 10*3/uL (ref 145–400)
RBC: 4.55 MIL/uL (ref 3.70–5.45)
RDW: 13.8 % (ref 11.2–14.5)
WBC: 8.8 10*3/uL (ref 3.9–10.3)

## 2018-09-10 NOTE — Patient Instructions (Signed)
Mammogram A mammogram is an X-ray of the breasts that is done to check for changes that are not normal. This test can screen for and find any changes that may suggest breast cancer. This test can also help to find other changes and variations in the breast. What happens before the procedure?  Have this test done about 1-2 weeks after your period. This is usually when your breasts are the least tender.  If you are visiting a new doctor or clinic, send any past mammogram images to your new doctor's office.  Wash your breasts and under your arms the day of the test.  Do not use deodorants, perfumes, lotions, or powders on the day of the test.  Take off any jewelry from your neck.  Wear clothes that you can change into and out of easily. What happens during the procedure?  You will undress from the waist up. You will put on a gown.  You will stand in front of the X-ray machine.  Each breast will be placed between two plastic or glass plates. The plates will press down on your breast for a few seconds. Try to stay as relaxed as possible. This does not cause any harm to your breasts. Any discomfort you feel will be very brief.  X-rays will be taken from different angles of each breast. The procedure may vary among doctors and hospitals. What happens after the procedure?  The mammogram will be looked at by a specialist (radiologist).  You may need to do certain parts of the test again. This depends on the quality of the images.  Ask when your test results will be ready. Make sure you get your test results.  You may go back to your normal activities. This information is not intended to replace advice given to you by your health care provider. Make sure you discuss any questions you have with your health care provider. Document Released: 02/22/2009 Document Revised: 05/03/2016 Document Reviewed: 02/04/2015 Elsevier Interactive Patient Education  2018 [employer]  

## 2018-09-10 NOTE — Telephone Encounter (Signed)
Appts scheduled calendar printed per 10/2 los

## 2018-09-10 NOTE — Progress Notes (Signed)
These results were called to Julia Zuniga and were reviewed with her . Her were answered. She expressed understanding. She will return as scheduled in 2 weeks should her pain continue. I discussed possible consideration for a breast MRI at that point. She is agreeable to begin a trial of NSAIDs.   [name]

## 2018-09-10 NOTE — Progress Notes (Signed)
[facility] Progress Note   Julia Zuniga [id number] 03-16-1961 57 y.o.   Julia Zuniga is managed by Dr. Jana Hakim  Actively treated with chemotherapy/immunotherapy: no   Assessment: Plan:    Malignant neoplasm of upper-outer quadrant of right breast in female, estrogen receptor negative ([place])  Breast pain, right   ER negative malignant neoplasm of the right breast: Julia Zuniga was previously managed by Dr Jana Hakim.  She has been released from his care.  Right/pain: Last was referred for a mammogram and ultrasound of her right breast with results as follows:  IMPRESSION: 1. There are no mammographic or targeted sonographic abnormalities in the right breast to explain the patient's new right breast pain.  RECOMMENDATION: 1. Clinical follow-up recommended for the painful area of concern in the right breast. Any further workup should be based on clinical grounds.  2. Return to routine screening mammography is recommended. The patient will be due for screening in December of 2019.  She was told to use NSAIDs.  She was tentatively scheduled to return for follow-up in 2 weeks but will cancel this appointment should her pain resolved.  If however her pain continues I discussed with her today that she may be referred for an MRI of the right breast.  A CBC and a chemistry panel were collected today.  Both returned within normal limits.  Please see After Visit Summary for patient specific instructions.  Future Appointments  Date Time Provider [place]  11/17/2018  8:15 AM Opalski, Neoma Laming, DO [facility] None    No orders of the defined types were placed in this encounter.      Subjective:   Patient ID:  Julia Zuniga is a 57 y.o. (DOB [birth year]) female.  Chief Complaint: No chief complaint on file.   HPI Julia Zuniga is a 57 year old female with an estrogen receptor negative and HER-2/neu positive right breast cancer.  She was last  seen by Dr. Jana Hakim on 06/04/2017.  She reports today that she has been released from his care and is to return only as needed.  She presents today with a report of a 5-day history of pain in her right breast.  The pain occurs in the medial aspect and radiates to the inner lower quadrant.  She describes the pain is dull and achy.  She denies any trauma or changes in activity.  There is no erythema.  She denies fevers or chills.  She does report that she has been keeping her grandson and picks him up.  He weighs approximately 16 pounds.  She does acknowledge that she lifted him over her head this past weekend.  Medications: I have reviewed the patient's current medications.  Allergies:  Allergies  Allergen Reactions  . Amlodipine     Dizziness, lightheaded, breathing issues   . Chlorhexidine     Patient skin gets bright red with rash.      . Codeine Swelling  . Medralone [Methylprednisolone Acetate]     "coming out of skin"   . Oxycodone-Acetaminophen Other (See Comments)    Face got red and hot    Past Medical History:  Diagnosis Date  . Anemia   . Anxiety   . Breast cancer ([place]) 12/08/12   Right  . Diverticulitis    2012  . Diverticulosis   . GERD (gastroesophageal reflux disease)   . Heartburn   . 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 History of radiation therapy 05/13/13-06/18/13  right breast, 5000 cGy 25 sessions  . Hot flashes   . Hyperlipidemia   . Hypertension   . Personal history of chemotherapy   . Personal history of radiation therapy   . PONV (postoperative nausea and vomiting)     Past Surgical History:  Procedure Laterality Date  . BREAST BIOPSY    . BREAST LUMPECTOMY    . BREAST LUMPECTOMY WITH SENTINEL LYMPH NODE BIOPSY  12/31/2012   Procedure: BREAST LUMPECTOMY WITH SENTINEL LYMPH NODE BX;  Surgeon: Stark Klein, MD;  Location: [place];  Service: General;  Laterality: Right;  . CESAREAN SECTION     x 3  .  CHOLECYSTECTOMY  2001   lap choli  . PORT-A-CATH REMOVAL Left 02/24/2014   Procedure: REMOVAL PORT-A-CATH;  Surgeon: Stark Klein, MD;  Location: [place];  Service: General;  Laterality: Left;  . PORTACATH PLACEMENT  12/31/2012   Procedure: INSERTION PORT-A-CATH;  Surgeon: Stark Klein, MD;  Location: [place];  Service: General;  Laterality: Left;  Left Subclavian Vein  . RE-EXCISION OF BREAST CANCER,SUPERIOR MARGINS  01/14/2013   Procedure: RE-EXCISION OF BREAST CANCER,SUPERIOR MARGINS;  Surgeon: Stark Klein, MD;  [facility];  Service: General;  Laterality: Right;  right breast re excision for markers   . TONSILLECTOMY AND ADENOIDECTOMY    . TUBAL LIGATION      Family History  Problem Relation Age of Onset  . Stomach cancer Father   . Lung cancer Maternal Uncle   . Skin cancer Maternal Grandmother   . Lung cancer Maternal Grandfather   . Lymphoma Cousin   . Colon cancer Neg Hx   . Esophageal cancer Neg Hx   . Rectal cancer Neg Hx     Social History   Socioeconomic History  . Marital status: Married    Spouse name: Not on file  . Number of children: Not on file  . Years of education: Not on file  . Highest education level: Not on file  Occupational History  . Not on file  Social Needs  . Financial resource strain: Not on file  . Food insecurity:    Worry: Not on file    Inability: Not on file  . Transportation needs:    Medical: Not on file    Non-medical: Not on file  Tobacco Use  . Smoking status: Never Smoker  . Smokeless tobacco: Never Used  Substance and Sexual Activity  . Alcohol use: Yes    Alcohol/week: 0.0 standard drinks    Comment: occ  . Drug use: No  . Sexual activity: Yes    Birth control/protection: Post-menopausal, Surgical    Comment: Tubal ligation 2000  Lifestyle  . Physical activity:    Days per week: Not on file    Minutes per session: Not on file  . Stress: Not on file  Relationships  . Social  connections:    Talks on phone: Not on file    Gets together: Not on file    Attends religious service: Not on file    Active member of club or organization: Not on file    Attends meetings of clubs or organizations: Not on file    Relationship status: Not on file  . Intimate partner violence:    Fear of current or ex partner: Not on file    Emotionally abused: Not on file    Physically abused: Not on file    Forced sexual activity: Not on file  Other  Topics Concern  . Not on file  Social History Narrative   Lives with husband and daughter in a 2 story home.  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     Does not work.  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    Education: college    Past Medical History, Surgical history, Social history, and Family history were reviewed and updated as appropriate.   Please see review of systems for further details on the patient's review from today.   Review of Systems:  Review of Systems  Constitutional: Negative for chills, diaphoresis and fever.  HENT: Negative for trouble swallowing and voice change.   Respiratory: Negative for cough, chest tightness, shortness of breath and wheezing.   Cardiovascular: Negative for chest pain and palpitations.  Gastrointestinal: Negative for abdominal pain, constipation, diarrhea, nausea and vomiting.  Musculoskeletal: Negative for back pain and myalgias.       Pain of the right breast.  Pain occurs in the medial aspect and radiates to the inner lower quadrant.   Neurological: Negative for dizziness, light-headedness and headaches.    Objective:   Physical Exam:  BP (!) 158/80 (BP Location: Right Arm, Patient Position: Sitting) Comment: RN Liza aware of BP.  Pulse 74   Temp 98.8 F (37.1 C) (Oral)   Resp 16   Ht '5\' 6"'$  (1.676 m)   Wt 247 lb 1.6 oz (112.1 kg)   LMP 06/24/2009   SpO2 97%   BMI 39.88 kg/m  ECOG: 0  Physical Exam  Constitutional: No distress.  HENT:  Head:  Normocephalic and atraumatic.  Right Ear: External ear normal.  Left Ear: External ear normal.  Mouth/Throat: Oropharynx is clear and moist. No oropharyngeal exudate.  Eyes: Right eye exhibits no discharge. Left eye exhibits no discharge. No scleral icterus.  Neck: Normal range of motion. Neck supple.  Cardiovascular: Normal rate, regular rhythm and normal heart sounds. Exam reveals no gallop and no friction rub.  No murmur heard. Pulmonary/Chest: Effort normal and breath sounds normal. No respiratory distress. She has no wheezes. She has no rales.    Musculoskeletal: She exhibits no edema.  Lymphadenopathy:    She has no cervical adenopathy.  Neurological: She is alert. Coordination normal.  Skin: Skin is warm and dry. No rash noted. She is not diaphoretic. No erythema.    Lab Review:     Component Value Date/Time   NA 140 11/11/2017 0953   NA 143 06/04/2017 1115   K 4.5 11/11/2017 0953   K 4.5 06/04/2017 1115   CL 102 11/11/2017 0953   CL 106 06/01/2013 1045   CO2 22 11/11/2017 0953   CO2 27 06/04/2017 1115   GLUCOSE 98 11/11/2017 0953   GLUCOSE 91 06/04/2017 1115   GLUCOSE 94 06/01/2013 1045   BUN 7 11/11/2017 0953   BUN 10.1 06/04/2017 1115   CREATININE 0.46 (L) 11/11/2017 0953   CREATININE 0.7 06/04/2017 1115   CALCIUM 9.8 11/11/2017 0953   CALCIUM 10.1 06/04/2017 1115   PROT 7.5 11/11/2017 0953   PROT 7.6 06/04/2017 1115   ALBUMIN 4.7 11/11/2017 0953   ALBUMIN 4.1 06/04/2017 1115   AST 17 11/11/2017 0953   AST 15 06/04/2017 1115   ALT 17 11/11/2017 0953   ALT 18 06/04/2017 1115   ALKPHOS 107 11/11/2017 0953   ALKPHOS 106 06/04/2017 1115   BILITOT 0.2 11/11/2017 0953   BILITOT 0.34 06/04/2017 1115   GFRNONAA 112 11/11/2017 0953  GFRNONAA >89 11/14/2016 0926   GFRAA 130 11/11/2017 0953   GFRAA >89 11/14/2016 0926       Component Value Date/Time   WBC 8.8 09/10/2018 0902   RBC 4.55 09/10/2018 0902   HGB 12.1 09/10/2018 0902   HGB 12.8 11/11/2017 0953     HGB 12.7 06/04/2017 1115   HCT 36.9 09/10/2018 0902   HCT 38.9 11/11/2017 0953   HCT 39.5 06/04/2017 1115   PLT 217 09/10/2018 0902   PLT 262 11/11/2017 0953   MCV 81.0 09/10/2018 0902   MCV 80 11/11/2017 0953   MCV 81.2 06/04/2017 1115   MCH 26.7 09/10/2018 0902   MCHC 32.9 09/10/2018 0902   RDW 13.8 09/10/2018 0902   RDW 14.0 11/11/2017 0953   RDW 13.4 06/04/2017 1115   LYMPHSABS 2.9 09/10/2018 0902   LYMPHSABS 3.1 11/11/2017 0953   LYMPHSABS 2.8 06/04/2017 1115   MONOABS 0.5 09/10/2018 0902   MONOABS 0.4 06/04/2017 1115   EOSABS 0.2 09/10/2018 0902   EOSABS 0.2 11/11/2017 0953   BASOSABS 0.1 09/10/2018 0902   BASOSABS 0.1 11/11/2017 0953   BASOSABS 0.1 06/04/2017 1115   -------------------------------  Imaging from last 24 hours (if applicable):  Radiology interpretation: No results found.

## 2018-09-16 ENCOUNTER — Other Ambulatory Visit: Payer: Self-pay | Admitting: Obstetrics and Gynecology

## 2018-09-16 DIAGNOSIS — Z853 Personal history of malignant neoplasm of breast: Secondary | ICD-10-CM

## 2018-09-24 ENCOUNTER — Ambulatory Visit: Payer: BLUE CROSS/BLUE SHIELD | Admitting: Adult Health

## 2018-10-31 ENCOUNTER — Other Ambulatory Visit: Payer: Self-pay | Admitting: Family Medicine

## 2018-11-03 DIAGNOSIS — Z01419 Encounter for gynecological examination (general) (routine) without abnormal findings: Secondary | ICD-10-CM | POA: Diagnosis not present

## 2018-11-03 DIAGNOSIS — Z13820 Encounter for screening for osteoporosis: Secondary | ICD-10-CM | POA: Diagnosis not present

## 2018-11-03 DIAGNOSIS — Z6838 Body mass index (BMI) 38.0-38.9, adult: Secondary | ICD-10-CM | POA: Diagnosis not present

## 2018-11-17 ENCOUNTER — Encounter: Payer: Self-pay | Admitting: Family Medicine

## 2018-11-17 ENCOUNTER — Ambulatory Visit (INDEPENDENT_AMBULATORY_CARE_PROVIDER_SITE_OTHER): Payer: BLUE CROSS/BLUE SHIELD | Admitting: Family Medicine

## 2018-11-17 VITALS — BP 121/86 | HR 74 | Temp 98.6°F | Ht 66.0 in | Wt 238.8 lb

## 2018-11-17 DIAGNOSIS — D508 Other iron deficiency anemias: Secondary | ICD-10-CM

## 2018-11-17 DIAGNOSIS — R5383 Other fatigue: Secondary | ICD-10-CM | POA: Diagnosis not present

## 2018-11-17 DIAGNOSIS — I10 Essential (primary) hypertension: Secondary | ICD-10-CM | POA: Diagnosis not present

## 2018-11-17 DIAGNOSIS — Z23 Encounter for immunization: Secondary | ICD-10-CM | POA: Diagnosis not present

## 2018-11-17 DIAGNOSIS — E785 Hyperlipidemia, unspecified: Secondary | ICD-10-CM | POA: Diagnosis not present

## 2018-11-17 NOTE — Progress Notes (Signed)
Impression and Recommendations:    1. Essential hypertension-  dx by Cards Jan '14   2. Hyperlipidemia, unspecified hyperlipidemia type   3. Fatigue, unspecified type   4. Other iron deficiency anemia   5. Flu vaccine need     - Patient counseled at length during appointment today.  Extensive discussion held with patient regarding policies and practices at the clinic.  Discussed recommendations with patient today, and answered all questions about screenings, care team, and yearly health management during appointment.  - Need for lab work today.  Today patient declines Thyroid, declines Vitamin D.  1) Anticipatory Guidance: Discussed importance of wearing a seatbelt while driving, not texting while driving; sunscreen when outside along with yearly skin surveillance; eating a well balanced and modest diet; physical activity at least 25 minutes per day or 150 min/ week of moderate to intense activity.  - Reviewed the ABCD rules for home skin screening of moles and marks; look for asymmetry, changes in border, changes in color, and changes in diameter.  2) Immunizations / Screenings / Labs: All immunizations and screenings that patient agrees to, are up-to-date per recommendations or will be updated today.  Patient understands the needs for q 53mo dental and yearly vision screens which pt will schedule independently. Obtain CBC, CMP, HgA1c, Lipid panel, TSH and vit D when fasting if not already done recently.   - Advised patient to continue follow up with OBGYN, Dr. Matthew Saras of [facility].  Per pt, her last pap was recently, 2019.  Last pap prior was around 12/07/2015.  CMA asked to contact OBGYN for to ensure information is sent over.  - Advised patient to ask gynecologist about her need for DEXA scans.  - Last mammogram 12/05/2017, was normal.  - Last colonoscopy done 03/26/2014, one polyp found; was benign.  Encouraged patient to follow up in 10 years.   - Per patient, "not  interested in the shingles vaccine."  Declines today.  Handout and information provided for patient's self-education.  Recommended visiting the [facility] website for further information.  - iFOB cards provided today.  Reviewed that these are to be used in the years between colonoscopy.  3) Weight: Discussed goal of losing even 5-10% of current body weight which would improve overall feelings of well being and improve objective health data significantly.   Improve nutrient density of diet through increasing intake of fruits and vegetables and decreasing saturated/trans fats, white flour products and refined sugar products.   4) BMI Counseling - Obesity, BMI of 38.5 Explained to patient what BMI refers to, and what it means medically.    Told patient to think about it as a "medical risk stratification measurement" and how increasing BMI is associated with increasing risk/ or worsening state of various diseases such as hypertension, hyperlipidemia, diabetes, premature OA, depression etc.  American Heart Association guidelines for healthy diet, basically Mediterranean diet, and exercise guidelines of 30 minutes 5 days per week or more discussed in detail.  Health counseling performed.  All questions answered.  5) Lifestyle & Preventative Health Maintenance: - Advised patient to continue working toward exercising to improve overall mental, physical, and emotional health.    - Reviewed the "spokes of the wheel" of mood and health management.  Stressed the importance of ongoing prudent habits, including regular exercise, appropriate sleep hygiene, healthful dietary habits, and prayer/meditation to relax.  - Encouraged patient to engage in daily physical activity, especially a formal exercise routine.  Recommended that the  patient eventually strive for at least 150 minutes of moderate cardiovascular activity per week according to guidelines established by the [facility].   - Healthy dietary habits encouraged,  including low-carb, and high amounts of lean protein in diet.   - Patient should also consume adequate amounts of water.  - Discussed that patient may return for further follow-up with other concerns, such as weight loss and chronic issues, as desired.   Orders Placed This Encounter  Procedures  . Flu Vaccine QUAD 6+ mos PF IM (Fluarix Quad PF)  . CBC with Differential/Platelet  . Comprehensive metabolic panel  . Hemoglobin A1c  . Lipid panel    Gross side effects, risk and benefits, and alternatives of medications discussed with patient.  Patient is aware that all medications have potential side effects and we are unable to predict every side effect or drug-drug interaction that may occur.  Expresses verbal understanding and consents to current therapy plan and treatment regimen.  F-up preventative CPE in 1 year. F/up sooner for chronic care management as discussed and/or prn.  Please see orders placed and AVS handed out to patient at the end of our visit for further patient instructions/ counseling done pertaining to today's office visit.  .This document serves as a record of services personally performed by Mellody Dance, DO. It was created on her behalf by Toni Amend, a trained medical scribe. The creation of this record is based on the scribe's personal observations and the provider's statements to them.   I have reviewed the above medical documentation for accuracy and completeness and I concur.  Mellody Dance, DO    Subjective:    Chief Complaint  Patient presents with  . Annual Exam    HPI: Julia Zuniga is a 57 y.o. female who presents to [place] [facility] today a yearly health maintenance exam.  Health Maintenance Summary Reviewed and updated, unless pt declines services.  Colonoscopy:  Last colonoscopy done 03/26/2014, one polyp found. Patient states she was told to repeat in 10 years since polyp was benign. Tobacco History  Reviewed:   Y; never smoker. Alcohol:   No concerns, no excessive use Exercise Habits:   Not meeting AHA guidelines STD concerns:   none Drug Use:   None Birth control method:   n/a Menses regular:     n/a Lumps or breast concerns:  no Breast Cancer Family History:  Patient has personal history of breast cancer. Bone/ DEXA scan:  Patient states she had a bone density scan done recently. However, patient denies early family history of osteoporosis.  Patient feels she has a sinus headache and suffering from allergies today.  OBGYN Follow-Up Continuing to see Dr. Matthew Saras at [facility]. Recently went for her annual exam a few days ago. Obtained last pap prior to this year around 12/07/2015.  Last mammogram obtained 12/05/2017, was normal.  Hearing Health Denies issues hearing.  Visual Health Last vision check was "maybe three years" ago. States her vision is not as good as it used to be; "I have to wear the readers of course."  Dental Health Last dental visit "It's been a while."  States she cannot afford visiting the dentist.  "I can afford the cleaning, just not the repair work."  Notes "we're talking about crowns, who can afford a few thousand per tooth?"  Skin Health Does not follow up with dermatology.  Denies recent skin concerns.    Immunization History  Administered Date(s) Administered  .  Influenza,inj,Quad PF,6+ Mos 11/14/2016, 11/11/2017, 11/17/2018  . Tdap 05/16/2011    Health Maintenance  Topic Date Due  . PAP SMEAR-Modifier  12/05/2018  . Hepatitis C Screening  11/18/2019 (Originally 07-11-1961)  . HIV Screening  05/22/2027 (Originally 09/04/1976)  . MAMMOGRAM  12/06/2019  . TETANUS/TDAP  05/15/2021  . COLONOSCOPY  03/26/2024  . INFLUENZA VACCINE  Completed     Wt Readings from Last 3 Encounters:  11/17/18 238 lb 12.8 oz (108.3 kg)  09/10/18 247 lb 1.6 oz (112.1 kg)  07/15/18 239 lb 11.2 oz (108.7 kg)   BP Readings from Last 3  Encounters:  11/17/18 121/86  09/10/18 (!) 158/80  07/15/18 120/83   Pulse Readings from Last 3 Encounters:  11/17/18 74  09/10/18 74  07/15/18 68     Past Medical History:  Diagnosis Date  . Anemia   . Anxiety   . Breast cancer ([place]) 12/08/12   Right  . Diverticulitis    2012  . Diverticulosis   . GERD (gastroesophageal reflux disease)   . Heartburn   . 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 History of radiation therapy 05/13/13-06/18/13   right breast, 5000 cGy 25 sessions  . Hot flashes   . Hyperlipidemia   . Hypertension   . Personal history of chemotherapy   . Personal history of radiation therapy   . PONV (postoperative nausea and vomiting)       Past Surgical History:  Procedure Laterality Date  . BREAST BIOPSY    . BREAST LUMPECTOMY Right 12/31/2012  . BREAST LUMPECTOMY WITH SENTINEL LYMPH NODE BIOPSY  12/31/2012   Procedure: BREAST LUMPECTOMY WITH SENTINEL LYMPH NODE BX;  Surgeon: Stark Klein, MD;  Location: [place];  Service: General;  Laterality: Right;  . CESAREAN SECTION     x 3  . CHOLECYSTECTOMY  2001   lap choli  . PORT-A-CATH REMOVAL Left 02/24/2014   Procedure: REMOVAL PORT-A-CATH;  Surgeon: Stark Klein, MD;  Location: [place];  Service: General;  Laterality: Left;  . PORTACATH PLACEMENT  12/31/2012   Procedure: INSERTION PORT-A-CATH;  Surgeon: Stark Klein, MD;  Location: [place];  Service: General;  Laterality: Left;  Left Subclavian Vein  . RE-EXCISION OF BREAST CANCER,SUPERIOR MARGINS  01/14/2013   Procedure: RE-EXCISION OF BREAST CANCER,SUPERIOR MARGINS;  Surgeon: Stark Klein, MD;  Location[facility];  Service: General;  Laterality: Right;  right breast re excision for markers   . TONSILLECTOMY AND ADENOIDECTOMY    . TUBAL LIGATION        Family History  Problem Relation Age of Onset  . Stomach cancer Father   . Lung cancer Maternal Uncle   . Skin cancer Maternal  Grandmother   . Lung cancer Maternal Grandfather   . Lymphoma Cousin   . Colon cancer Neg Hx   . Esophageal cancer Neg Hx   . Rectal cancer Neg Hx       Social History   Substance and Sexual Activity  Drug Use No  ,   Social History   Substance and Sexual Activity  Alcohol Use Yes  . Alcohol/week: 0.0 standard drinks   Comment: occ  ,   Social History   Tobacco Use  Smoking Status Never Smoker  Smokeless Tobacco Never Used  ,   Social History   Substance and Sexual Activity  Sexual Activity Yes  . Birth control/protection: Post-menopausal, Surgical   Comment: Tubal ligation 2000  Current Outpatient Medications on File Prior to Visit  Medication Sig Dispense Refill  . b complex vitamins tablet Take 1 tablet by mouth daily.    . carvedilol (COREG) 12.5 MG tablet Take 1 tablet (12.5 mg total) by mouth 2 (two) times daily with a meal. 180 tablet 1  . ibuprofen (ADVIL,MOTRIN) 200 MG tablet Take 400 mg by mouth every 6 (six) hours as needed. Pain    . LORazepam (ATIVAN) 0.5 MG tablet Take 1 tablet (0.5 mg total) by mouth daily as needed for anxiety (only as needed panic attacks). 60 tablet 0  . omeprazole (PRILOSEC) 20 MG capsule TAKE 1 CAPSULE BY MOUTH DAILY. 90 capsule 1  . rosuvastatin (CRESTOR) 5 MG tablet TAKE 1 TABLET BY MOUTH DAILY. 90 tablet 1   No current facility-administered medications on file prior to visit.     Allergies: Amlodipine; Chlorhexidine; Codeine; Medralone [methylprednisolone acetate]; and Oxycodone-acetaminophen  Review of Systems: General:   Denies fever, chills, unexplained weight loss.  Optho/Auditory:   Denies visual changes, blurred vision/LOV Respiratory:   Denies SOB, DOE more than baseline levels.  Cardiovascular:   Denies chest pain, palpitations, new onset peripheral edema  Gastrointestinal:   Denies nausea, vomiting, diarrhea.  Genitourinary: Denies dysuria, freq/ urgency, flank pain or discharge from genitals.  Endocrine:      Denies hot or cold intolerance, polyuria, polydipsia. Musculoskeletal:   Denies unexplained myalgias, joint swelling, unexplained arthralgias, gait problems.  Skin:  Denies rash, suspicious lesions Neurological:     Denies dizziness, unexplained weakness, numbness  Psychiatric/Behavioral:   Denies mood changes, suicidal or homicidal ideations, hallucinations    Objective:    Blood pressure 121/86, pulse 74, temperature 98.6 F (37 C), height 5\' 6"  (1.676 m), weight 238 lb 12.8 oz (108.3 kg), last menstrual period 06/24/2009, SpO2 100 %. Body mass index is 38.54 kg/m. General Appearance:    Alert, cooperative, no distress, appears stated age  Head:    Normocephalic, without obvious abnormality, atraumatic  Eyes:    PERRL, conjunctiva/corneas clear, EOM's intact, fundi    benign, both eyes  Ears:    Normal TM's and external ear canals, both ears  Nose:   Nares normal, septum midline, mucosa normal, no drainage    or sinus tenderness  Throat:   Lips w/o lesion, mucosa moist, and tongue normal; teeth and   gums normal  Neck:   Supple, symmetrical, trachea midline, no adenopathy;    thyroid:  no enlargement/tenderness/nodules; no carotid   bruit or JVD  Back:     Symmetric, no curvature, ROM normal, no CVA tenderness  Lungs:     Clear to auscultation bilaterally, respirations unlabored, no       Wh/ R/ R  Chest Wall:    No tenderness or gross deformity; normal excursion   Heart:    Regular rate and rhythm, S1 and S2 normal, no murmur, rub   or gallop  Breast Exam:    Deferred to OBGYN.  Abdomen:     Soft, non-tender, bowel sounds active all four quadrants, NO   G/R/R, no masses, no organomegaly  Genitalia:    Deferred to OBGYN.  Rectal:    Deferred to OBGYN.  Extremities:   Extremities normal, atraumatic, no cyanosis or gross edema  Pulses:   2+ and symmetric all extremities  Skin:   Warm, dry, Skin color, texture, turgor normal, no obvious rashes or lesions Psych: No HI/SI,  judgement and insight good, Euthymic mood. Full Affect.  Neurologic:  CNII-XII intact, normal strength, sensation and reflexes    Throughout

## 2018-11-17 NOTE — Patient Instructions (Signed)
Preventive Care for Adults, Female  A healthy lifestyle and preventive care can promote health and wellness. Preventive health guidelines for women include the following key practices.   A routine yearly physical is a good way to check with your health care provider about your health and preventive screening. It is a chance to share any concerns and updates on your health and to receive a thorough exam.   Visit your dentist for a routine exam and preventive care every 6 months. Brush your teeth twice a day and floss once a day. Good oral hygiene prevents tooth decay and gum disease.   The frequency of eye exams is based on your age, health, family medical history, use of contact lenses, and other factors. Follow your health care provider's recommendations for frequency of eye exams.   Eat a healthy diet. Foods like vegetables, fruits, whole grains, low-fat dairy products, and lean protein foods contain the nutrients you need without too many calories. Decrease your intake of foods high in solid fats, added sugars, and salt. Eat the right amount of calories for you.Get information about a proper diet from your health care provider, if necessary.   Regular physical exercise is one of the most important things you can do for your health. Most adults should get at least 150 minutes of moderate-intensity exercise (any activity that increases your heart rate and causes you to sweat) each week. In addition, most adults need muscle-strengthening exercises on 2 or more days a week.   Maintain a healthy weight. The body mass index (BMI) is a screening tool to identify possible weight problems. It provides an estimate of body fat based on height and weight. Your health care provider can find your BMI, and can help you achieve or maintain a healthy weight.For adults 20 years and older:   - A BMI below 18.5 is considered underweight.   - A BMI of 18.5 to 24.9 is normal.   - A BMI of 25 to 29.9 is  considered overweight.   - A BMI of 30 and above is considered obese.   Maintain normal blood lipids and cholesterol levels by exercising and minimizing your intake of trans and saturated fats.  Eat a balanced diet with plenty of fruit and vegetables. Blood tests for lipids and cholesterol should begin at age 20 and be repeated every 5 years minimum.  If your lipid or cholesterol levels are high, you are over 40, or you are at high risk for heart disease, you may need your cholesterol levels checked more frequently.Ongoing high lipid and cholesterol levels should be treated with medicines if diet and exercise are not working.   If you smoke, find out from your health care provider how to quit. If you do not use tobacco, do not start.   Lung cancer screening is recommended for adults aged 55-80 years who are at high risk for developing lung cancer because of a history of smoking. A yearly low-dose CT scan of the lungs is recommended for people who have at least a 30-pack-year history of smoking and are a current smoker or have quit within the past 15 years. A pack year of smoking is smoking an average of 1 pack of cigarettes a day for 1 year (for example: 1 pack a day for 30 years or 2 packs a day for 15 years). Yearly screening should continue until the smoker has stopped smoking for at least 15 years. Yearly screening should be stopped for people who develop a   health problem that would prevent them from having lung cancer treatment.   If you are pregnant, do not drink alcohol. If you are breastfeeding, be very cautious about drinking alcohol. If you are not pregnant and choose to drink alcohol, do not have more than 1 drink per day. One drink is considered to be 12 ounces (355 mL) of beer, 5 ounces (148 mL) of wine, or 1.5 ounces (44 mL) of liquor.   Avoid use of street drugs. Do not share needles with anyone. Ask for help if you need support or instructions about stopping the use of  drugs.   High blood pressure causes heart disease and increases the risk of stroke. Your blood pressure should be checked at least yearly.  Ongoing high blood pressure should be treated with medicines if weight loss and exercise do not work.   If you are 69-55 years old, ask your health care provider if you should take aspirin to prevent strokes.   Diabetes screening involves taking a blood sample to check your fasting blood sugar level. This should be done once every 3 years, after age 38, if you are within normal weight and without risk factors for diabetes. Testing should be considered at a younger age or be carried out more frequently if you are overweight and have at least 1 risk factor for diabetes.   Breast cancer screening is essential preventive care for women. You should practice "breast self-awareness."  This means understanding the normal appearance and feel of your breasts and may include breast self-examination.  Any changes detected, no matter how small, should be reported to a health care provider.  Women in their 80s and 30s should have a clinical breast exam (CBE) by a health care provider as part of a regular health exam every 1 to 3 years.  After age 66, women should have a CBE every year.  Starting at age 1, women should consider having a mammogram (breast X-ray test) every year.  Women who have a family history of breast cancer should talk to their health care provider about genetic screening.  Women at a high risk of breast cancer should talk to their health care providers about having an MRI and a mammogram every year.   -Breast cancer gene (BRCA)-related cancer risk assessment is recommended for women who have family members with BRCA-related cancers. BRCA-related cancers include breast, ovarian, tubal, and peritoneal cancers. Having family members with these cancers may be associated with an increased risk for harmful changes (mutations) in the breast cancer genes BRCA1 and  BRCA2. Results of the assessment will determine the need for genetic counseling and BRCA1 and BRCA2 testing.   The Pap test is a screening test for cervical cancer. A Pap test can show cell changes on the cervix that might become cervical cancer if left untreated. A Pap test is a procedure in which cells are obtained and examined from the lower end of the uterus (cervix).   - Women should have a Pap test starting at age 57.   - Between ages 90 and 70, Pap tests should be repeated every 2 years.   - Beginning at age 63, you should have a Pap test every 3 years as long as the past 3 Pap tests have been normal.   - Some women have medical problems that increase the chance of getting cervical cancer. Talk to your health care provider about these problems. It is especially important to talk to your health care provider if a  new problem develops soon after your last Pap test. In these cases, your health care provider may recommend more frequent screening and Pap tests.   - The above recommendations are the same for women who have or have not gotten the vaccine for human papillomavirus (HPV).   - If you had a hysterectomy for a problem that was not cancer or a condition that could lead to cancer, then you no longer need Pap tests. Even if you no longer need a Pap test, a regular exam is a good idea to make sure no other problems are starting.   - If you are between ages 36 and 66 years, and you have had normal Pap tests going back 10 years, you no longer need Pap tests. Even if you no longer need a Pap test, a regular exam is a good idea to make sure no other problems are starting.   - If you have had past treatment for cervical cancer or a condition that could lead to cancer, you need Pap tests and screening for cancer for at least 20 years after your treatment.   - If Pap tests have been discontinued, risk factors (such as a new sexual partner) need to be reassessed to determine if screening should  be resumed.   - The HPV test is an additional test that may be used for cervical cancer screening. The HPV test looks for the virus that can cause the cell changes on the cervix. The cells collected during the Pap test can be tested for HPV. The HPV test could be used to screen women aged 70 years and older, and should be used in women of any age who have unclear Pap test results. After the age of 67, women should have HPV testing at the same frequency as a Pap test.   Colorectal cancer can be detected and often prevented. Most routine colorectal cancer screening begins at the age of 57 years and continues through age 26 years. However, your health care provider may recommend screening at an earlier age if you have risk factors for colon cancer. On a yearly basis, your health care provider may provide home test kits to check for hidden blood in the stool.  Use of a small camera at the end of a tube, to directly examine the colon (sigmoidoscopy or colonoscopy), can detect the earliest forms of colorectal cancer. Talk to your health care provider about this at age 23, when routine screening begins. Direct exam of the colon should be repeated every 5 -10 years through age 49 years, unless early forms of pre-cancerous polyps or small growths are found.   People who are at an increased risk for hepatitis B should be screened for this virus. You are considered at high risk for hepatitis B if:  -You were born in a country where hepatitis B occurs often. Talk with your health care provider about which countries are considered high risk.  - Your parents were born in a high-risk country and you have not received a shot to protect against hepatitis B (hepatitis B vaccine).  - You have HIV or AIDS.  - You use needles to inject street drugs.  - You live with, or have sex with, someone who has Hepatitis B.  - You get hemodialysis treatment.  - You take certain medicines for conditions like cancer, organ  transplantation, and autoimmune conditions.   Hepatitis C blood testing is recommended for all people born from [birth year] through 1965 and any individual  with known risks for hepatitis C.   Practice safe sex. Use condoms and avoid high-risk sexual practices to reduce the spread of sexually transmitted infections (STIs). STIs include gonorrhea, chlamydia, syphilis, trichomonas, herpes, HPV, and human immunodeficiency virus (HIV). Herpes, HIV, and HPV are viral illnesses that have no cure. They can result in disability, cancer, and death. Sexually active women aged 25 years and younger should be checked for chlamydia. Older women with new or multiple partners should also be tested for chlamydia. Testing for other STIs is recommended if you are sexually active and at increased risk.   Osteoporosis is a disease in which the bones lose minerals and strength with aging. This can result in serious bone fractures or breaks. The risk of osteoporosis can be identified using a bone density scan. Women ages 65 years and over and women at risk for fractures or osteoporosis should discuss screening with their health care providers. Ask your health care provider whether you should take a calcium supplement or vitamin D to There are also several preventive steps women can take to avoid osteoporosis and resulting fractures or to keep osteoporosis from worsening. -->Recommendations include:  Eat a balanced diet high in fruits, vegetables, calcium, and vitamins.  Get enough calcium. The recommended total intake of is 1,200 mg daily; for best absorption, if taking supplements, divide doses into 250-500 mg doses throughout the day. Of the two types of calcium, calcium carbonate is best absorbed when taken with food but calcium citrate can be taken on an empty stomach.  Get enough vitamin D. NAMS and the National Osteoporosis Foundation recommend at least 1,000 IU per day for women age 50 and over who are at risk of vitamin D  deficiency. Vitamin D deficiency can be caused by inadequate sun exposure (for example, those who live in northern latitudes).  Avoid alcohol and smoking. Heavy alcohol intake (more than 7 drinks per week) increases the risk of falls and hip fracture and women smokers tend to lose bone more rapidly and have lower bone mass than nonsmokers. Stopping smoking is one of the most important changes women can make to improve their health and decrease risk for disease.  Be physically active every day. Weight-bearing exercise (for example, fast walking, hiking, jogging, and weight training) may strengthen bones or slow the rate of bone loss that comes with aging. Balancing and muscle-strengthening exercises can reduce the risk of falling and fracture.  Consider therapeutic medications. Currently, several types of effective drugs are available. Healthcare providers can recommend the type most appropriate for each woman.  Eliminate environmental factors that may contribute to accidents. Falls cause nearly 90% of all osteoporotic fractures, so reducing this risk is an important bone-health strategy. Measures include ample lighting, removing obstructions to walking, using nonskid rugs on floors, and placing mats and/or grab bars in showers.  Be aware of medication side effects. Some common medicines make bones weaker. These include a type of steroid drug called glucocorticoids used for arthritis and asthma, some antiseizure drugs, certain sleeping pills, treatments for endometriosis, and some cancer drugs. An overactive thyroid gland or using too much thyroid hormone for an underactive thyroid can also be a problem. If you are taking these medicines, talk to your doctor about what you can do to help protect your bones.reduce the rate of osteoporosis.    Menopause can be associated with physical symptoms and risks. Hormone replacement therapy is available to decrease symptoms and risks. You should talk to your  health care provider   about whether hormone replacement therapy is right for you.   Use sunscreen. Apply sunscreen liberally and repeatedly throughout the day. You should seek shade when your shadow is shorter than you. Protect yourself by wearing long sleeves, pants, a wide-brimmed hat, and sunglasses year round, whenever you are outdoors.   Once a month, do a whole body skin exam, using a mirror to look at the skin on your back. Tell your health care provider of new moles, moles that have irregular borders, moles that are larger than a pencil eraser, or moles that have changed in shape or color.   -Stay current with required vaccines (immunizations).   Influenza vaccine. All adults should be immunized every year.  Tetanus, diphtheria, and acellular pertussis (Td, Tdap) vaccine. Pregnant women should receive 1 dose of Tdap vaccine during each pregnancy. The dose should be obtained regardless of the length of time since the last dose. Immunization is preferred during the 27th 36th week of gestation. An adult who has not previously received Tdap or who does not know her vaccine status should receive 1 dose of Tdap. This initial dose should be followed by tetanus and diphtheria toxoids (Td) booster doses every 10 years. Adults with an unknown or incomplete history of completing a 3-dose immunization series with Td-containing vaccines should begin or complete a primary immunization series including a Tdap dose. Adults should receive a Td booster every 10 years.  Varicella vaccine. An adult without evidence of immunity to varicella should receive 2 doses or a second dose if she has previously received 1 dose. Pregnant females who do not have evidence of immunity should receive the first dose after pregnancy. This first dose should be obtained before leaving the health care facility. The second dose should be obtained 4 8 weeks after the first dose.  Human papillomavirus (HPV) vaccine. Females aged 13 26  years who have not received the vaccine previously should obtain the 3-dose series. The vaccine is not recommended for use in pregnant females. However, pregnancy testing is not needed before receiving a dose. If a female is found to be pregnant after receiving a dose, no treatment is needed. In that case, the remaining doses should be delayed until after the pregnancy. Immunization is recommended for any person with an immunocompromised condition through the age of 26 years if she did not get any or all doses earlier. During the 3-dose series, the second dose should be obtained 4 8 weeks after the first dose. The third dose should be obtained 24 weeks after the first dose and 16 weeks after the second dose.  Zoster vaccine. One dose is recommended for adults aged 60 years or older unless certain conditions are present.  Measles, mumps, and rubella (MMR) vaccine. Adults born before 1957 generally are considered immune to measles and mumps. Adults born in 1957 or later should have 1 or more doses of MMR vaccine unless there is a contraindication to the vaccine or there is laboratory evidence of immunity to each of the three diseases. A routine second dose of MMR vaccine should be obtained at least 28 days after the first dose for students attending postsecondary schools, health care workers, or international travelers. People who received inactivated measles vaccine or an unknown type of measles vaccine during 1963 1967 should receive 2 doses of MMR vaccine. People who received inactivated mumps vaccine or an unknown type of mumps vaccine before 1979 and are at high risk for mumps infection should consider immunization with 2 doses of   MMR vaccine. For females of childbearing age, rubella immunity should be determined. If there is no evidence of immunity, females who are not pregnant should be vaccinated. If there is no evidence of immunity, females who are pregnant should delay immunization until after pregnancy.  Unvaccinated health care workers born before [birth year] who lack laboratory evidence of measles, mumps, or rubella immunity or laboratory confirmation of disease should consider measles and mumps immunization with 2 doses of MMR vaccine or rubella immunization with 1 dose of MMR vaccine.  Pneumococcal 13-valent conjugate (PCV13) vaccine. When indicated, a person who is uncertain of her immunization history and has no record of immunization should receive the PCV13 vaccine. An adult aged 54 years or older who has certain medical conditions and has not been previously immunized should receive 1 dose of PCV13 vaccine. This PCV13 should be followed with a dose of pneumococcal polysaccharide (PPSV23) vaccine. The PPSV23 vaccine dose should be obtained at least 8 weeks after the dose of PCV13 vaccine. An adult aged 58 years or older who has certain medical conditions and previously received 1 or more doses of PPSV23 vaccine should receive 1 dose of PCV13. The PCV13 vaccine dose should be obtained 1 or more years after the last PPSV23 vaccine dose.  Pneumococcal polysaccharide (PPSV23) vaccine. When PCV13 is also indicated, PCV13 should be obtained first. All adults aged 58 years and older should be immunized. An adult younger than age 65 years who has certain medical conditions should be immunized. Any person who resides in a nursing home or long-term care facility should be immunized. An adult smoker should be immunized. People with an immunocompromised condition and certain other conditions should receive both PCV13 and PPSV23 vaccines. People with human immunodeficiency virus (HIV) infection should be immunized as soon as possible after diagnosis. Immunization during chemotherapy or radiation therapy should be avoided. Routine use of PPSV23 vaccine is not recommended for American Indians, [place] Natives, or people younger than 65 years unless there are medical conditions that require PPSV23 vaccine. When indicated,  people who have unknown immunization and have no record of immunization should receive PPSV23 vaccine. One-time revaccination 5 years after the first dose of PPSV23 is recommended for people aged 70 64 years who have chronic kidney failure, nephrotic syndrome, asplenia, or immunocompromised conditions. People who received 1 2 doses of PPSV23 before age 32 years should receive another dose of PPSV23 vaccine at age 96 years or later if at least 5 years have passed since the previous dose. Doses of PPSV23 are not needed for people immunized with PPSV23 at or after age 55 years.  Meningococcal vaccine. Adults with asplenia or persistent complement component deficiencies should receive 2 doses of quadrivalent meningococcal conjugate (MenACWY-D) vaccine. The doses should be obtained at least 2 months apart. Microbiologists working with certain meningococcal bacteria, [employer] recruits, people at risk during an outbreak, and people who travel to or live in countries with a high rate of meningitis should be immunized. A first-year college student up through age 58 years who is living in a residence hall should receive a dose if she did not receive a dose on or after her 16th birthday. Adults who have certain high-risk conditions should receive one or more doses of vaccine.  Hepatitis A vaccine. Adults who wish to be protected from this disease, have certain high-risk conditions, work with hepatitis A-infected animals, work in hepatitis A research labs, or travel to or work in countries with a high rate of hepatitis A should be  immunized. Adults who were previously unvaccinated and who anticipate close contact with an international adoptee during the first 60 days after arrival in the [place] from a country with a high rate of hepatitis A should be immunized.  Hepatitis B vaccine.  Adults who wish to be protected from this disease, have certain high-risk conditions, may be exposed to blood or other infectious  body fluids, are household contacts or sex partners of hepatitis B positive people, are clients or workers in certain care facilities, or travel to or work in countries with a high rate of hepatitis B should be immunized.  Haemophilus influenzae type b (Hib) vaccine. A previously unvaccinated person with asplenia or sickle cell disease or having a scheduled splenectomy should receive 1 dose of Hib vaccine. Regardless of previous immunization, a recipient of a hematopoietic stem cell transplant should receive a 3-dose series 6 12 months after her successful transplant. Hib vaccine is not recommended for adults with HIV infection.  Preventive Services / Frequency Ages 6 to 39years  Blood pressure check.** / Every 1 to 2 years.  Lipid and cholesterol check.** / Every 5 years beginning at age 39.  Clinical breast exam.** / Every 3 years for women in their 61s and 62s.  BRCA-related cancer risk assessment.** / For women who have family members with a BRCA-related cancer (breast, ovarian, tubal, or peritoneal cancers).  Pap test.** / Every 2 years from ages 47 through 85. Every 3 years starting at age 34 through age 12 or 74 with a history of 3 consecutive normal Pap tests.  HPV screening.** / Every 3 years from ages 46 through ages 43 to 54 with a history of 3 consecutive normal Pap tests.  Hepatitis C blood test.** / For any individual with known risks for hepatitis C.  Skin self-exam. / Monthly.  Influenza vaccine. / Every year.  Tetanus, diphtheria, and acellular pertussis (Tdap, Td) vaccine.** / Consult your health care provider. Pregnant women should receive 1 dose of Tdap vaccine during each pregnancy. 1 dose of Td every 10 years.  Varicella vaccine.** / Consult your health care provider. Pregnant females who do not have evidence of immunity should receive the first dose after pregnancy.  HPV vaccine. / 3 doses over 6 months, if 64 and younger. The vaccine is not recommended for use in  pregnant females. However, pregnancy testing is not needed before receiving a dose.  Measles, mumps, rubella (MMR) vaccine.** / You need at least 1 dose of MMR if you were born in 1957 or later. You may also need a 2nd dose. For females of childbearing age, rubella immunity should be determined. If there is no evidence of immunity, females who are not pregnant should be vaccinated. If there is no evidence of immunity, females who are pregnant should delay immunization until after pregnancy.  Pneumococcal 13-valent conjugate (PCV13) vaccine.** / Consult your health care provider.  Pneumococcal polysaccharide (PPSV23) vaccine.** / 1 to 2 doses if you smoke cigarettes or if you have certain conditions.  Meningococcal vaccine.** / 1 dose if you are age 71 to 38 years and a Market researcher living in a residence hall, or have one of several medical conditions, you need to get vaccinated against meningococcal disease. You may also need additional booster doses.  Hepatitis A vaccine.** / Consult your health care provider.  Hepatitis B vaccine.** / Consult your health care provider.  Haemophilus influenzae type b (Hib) vaccine.** / Consult your health care provider.  Ages 55 to 64years  Blood pressure check.** / Every 1 to 2 years.  Lipid and cholesterol check.** / Every 5 years beginning at age 20 years.  Lung cancer screening. / Every year if you are aged 55 80 years and have a 30-pack-year history of smoking and currently smoke or have quit within the past 15 years. Yearly screening is stopped once you have quit smoking for at least 15 years or develop a health problem that would prevent you from having lung cancer treatment.  Clinical breast exam.** / Every year after age 40 years.  BRCA-related cancer risk assessment.** / For women who have family members with a BRCA-related cancer (breast, ovarian, tubal, or peritoneal cancers).  Mammogram.** / Every year beginning at age 40  years and continuing for as long as you are in good health. Consult with your health care provider.  Pap test.** / Every 3 years starting at age 30 years through age 65 or 70 years with a history of 3 consecutive normal Pap tests.  HPV screening.** / Every 3 years from ages 30 years through ages 65 to 70 years with a history of 3 consecutive normal Pap tests.  Fecal occult blood test (FOBT) of stool. / Every year beginning at age 50 years and continuing until age 75 years. You may not need to do this test if you get a colonoscopy every 10 years.  Flexible sigmoidoscopy or colonoscopy.** / Every 5 years for a flexible sigmoidoscopy or every 10 years for a colonoscopy beginning at age 50 years and continuing until age 75 years.  Hepatitis C blood test.** / For all people born from 1945 through 1965 and any individual with known risks for hepatitis C.  Skin self-exam. / Monthly.  Influenza vaccine. / Every year.  Tetanus, diphtheria, and acellular pertussis (Tdap/Td) vaccine.** / Consult your health care provider. Pregnant women should receive 1 dose of Tdap vaccine during each pregnancy. 1 dose of Td every 10 years.  Varicella vaccine.** / Consult your health care provider. Pregnant females who do not have evidence of immunity should receive the first dose after pregnancy.  Zoster vaccine.** / 1 dose for adults aged 60 years or older.  Measles, mumps, rubella (MMR) vaccine.** / You need at least 1 dose of MMR if you were born in 1957 or later. You may also need a 2nd dose. For females of childbearing age, rubella immunity should be determined. If there is no evidence of immunity, females who are not pregnant should be vaccinated. If there is no evidence of immunity, females who are pregnant should delay immunization until after pregnancy.  Pneumococcal 13-valent conjugate (PCV13) vaccine.** / Consult your health care provider.  Pneumococcal polysaccharide (PPSV23) vaccine.** / 1 to 2 doses if  you smoke cigarettes or if you have certain conditions.  Meningococcal vaccine.** / Consult your health care provider.  Hepatitis A vaccine.** / Consult your health care provider.  Hepatitis B vaccine.** / Consult your health care provider.  Haemophilus influenzae type b (Hib) vaccine.** / Consult your health care provider.  Ages 65 years and over  Blood pressure check.** / Every 1 to 2 years.  Lipid and cholesterol check.** / Every 5 years beginning at age 20 years.  Lung cancer screening. / Every year if you are aged 55 80 years and have a 30-pack-year history of smoking and currently smoke or have quit within the past 15 years. Yearly screening is stopped once you have quit smoking for at least 15 years or develop a health problem that   would prevent you from having lung cancer treatment.  Clinical breast exam.** / Every year after age 103 years.  BRCA-related cancer risk assessment.** / For women who have family members with a BRCA-related cancer (breast, ovarian, tubal, or peritoneal cancers).  Mammogram.** / Every year beginning at age 36 years and continuing for as long as you are in good health. Consult with your health care provider.  Pap test.** / Every 3 years starting at age 5 years through age 85 or 10 years with 3 consecutive normal Pap tests. Testing can be stopped between 65 and 70 years with 3 consecutive normal Pap tests and no abnormal Pap or HPV tests in the past 10 years.  HPV screening.** / Every 3 years from ages 93 years through ages 70 or 45 years with a history of 3 consecutive normal Pap tests. Testing can be stopped between 65 and 70 years with 3 consecutive normal Pap tests and no abnormal Pap or HPV tests in the past 10 years.  Fecal occult blood test (FOBT) of stool. / Every year beginning at age 8 years and continuing until age 45 years. You may not need to do this test if you get a colonoscopy every 10 years.  Flexible sigmoidoscopy or colonoscopy.** /  Every 5 years for a flexible sigmoidoscopy or every 10 years for a colonoscopy beginning at age 69 years and continuing until age 68 years.  Hepatitis C blood test.** / For all people born from [birth year] through 1964 and any individual with known risks for hepatitis C.  Osteoporosis screening.** / A one-time screening for women ages 7 years and over and women at risk for fractures or osteoporosis.  Skin self-exam. / Monthly.  Influenza vaccine. / Every year.  Tetanus, diphtheria, and acellular pertussis (Tdap/Td) vaccine.** / 1 dose of Td every 10 years.  Varicella vaccine.** / Consult your health care provider.  Zoster vaccine.** / 1 dose for adults aged 5 years or older.  Pneumococcal 13-valent conjugate (PCV13) vaccine.** / Consult your health care provider.  Pneumococcal polysaccharide (PPSV23) vaccine.** / 1 dose for all adults aged 74 years and older.  Meningococcal vaccine.** / Consult your health care provider.  Hepatitis A vaccine.** / Consult your health care provider.  Hepatitis B vaccine.** / Consult your health care provider.  Haemophilus influenzae type b (Hib) vaccine.** / Consult your health care provider. ** Family history and personal history of risk and conditions may change your health care provider's recommendations. Document Released: 01/22/2002 Document Revised: 09/16/2013  [facility] Patient Information 2014 [place], [place].   EXERCISE AND DIET:  We recommended that you start or continue a regular exercise program for good health. Regular exercise means any activity that makes your heart beat faster and makes you sweat.  We recommend exercising at least 30 minutes per day at least 3 days a week, preferably 5.  We also recommend a diet low in fat and sugar / carbohydrates.  Inactivity, poor dietary choices and obesity can cause diabetes, heart attack, stroke, and kidney damage, among others.     ALCOHOL AND SMOKING:  Women should limit their alcohol intake to no  more than 7 drinks/beers/glasses of wine (combined, not each!) per week. Moderation of alcohol intake to this level decreases your risk of breast cancer and liver damage.  ( And of course, no recreational drugs are part of a healthy lifestyle.)  Also, you should not be smoking at all or even being exposed to second hand smoke. Most people know smoking can  cause cancer, and various heart and lung diseases, but did you know it also contributes to weakening of your bones?  Aging of your skin?  Yellowing of your teeth and nails?   CALCIUM AND VITAMIN D:  Adequate intake of calcium and Vitamin D are recommended.  The recommendations for exact amounts of these supplements seem to change often, but generally speaking 600 mg of calcium (either carbonate or citrate) and 800 units of Vitamin D per day seems prudent. Certain women may benefit from higher intake of Vitamin D.  If you are among these women, your doctor will have told you during your visit.     PAP SMEARS:  Pap smears, to check for cervical cancer or precancers,  have traditionally been done yearly, although recent scientific advances have shown that most women can have pap smears less often.  However, every woman still should have a physical exam from her gynecologist or primary care physician every year. It will include a breast check, inspection of the vulva and vagina to check for abnormal growths or skin changes, a visual exam of the cervix, and then an exam to evaluate the size and shape of the uterus and ovaries.  And after 56 years of age, a rectal exam is indicated to check for rectal cancers. We will also provide age appropriate advice regarding health maintenance, like when you should have certain vaccines, screening for sexually transmitted diseases, bone density testing, colonoscopy, mammograms, etc.    MAMMOGRAMS:  All women over 71 years old should have a yearly mammogram. Many facilities now offer a "3D" mammogram, which may cost  around $50 extra out of pocket. If possible,  we recommend you accept the option to have the 3D mammogram performed.  It both reduces the number of women who will be called back for extra views which then turn out to be normal, and it is better than the routine mammogram at detecting truly abnormal areas.     COLONOSCOPY:  Colonoscopy to screen for colon cancer is recommended for all women at age 52.  We know, you hate the idea of the prep.  We agree, BUT, having colon cancer and not knowing it is worse!!  Colon cancer so often starts as a polyp that can be seen and removed at colonscopy, which can quite literally save your life!  And if your first colonoscopy is normal and you have no family history of colon cancer, most women don't have to have it again for 10 years.  Once every ten years, you can do something that may end up saving your life, right?  We will be happy to help you get it scheduled when you are ready.  Be sure to check your insurance coverage so you understand how much it will cost.  It may be covered as a preventative service at no cost, but you should check your particular policy.

## 2018-11-18 LAB — CBC WITH DIFFERENTIAL/PLATELET
Basophils Absolute: 0.1 10*3/uL (ref 0.0–0.2)
Basos: 1 %
EOS (ABSOLUTE): 0.2 10*3/uL (ref 0.0–0.4)
EOS: 2 %
HEMATOCRIT: 40.7 % (ref 34.0–46.6)
HEMOGLOBIN: 13 g/dL (ref 11.1–15.9)
Immature Grans (Abs): 0 10*3/uL (ref 0.0–0.1)
Immature Granulocytes: 0 %
Lymphocytes Absolute: 3.1 10*3/uL (ref 0.7–3.1)
Lymphs: 29 %
MCH: 25.8 pg — ABNORMAL LOW (ref 26.6–33.0)
MCHC: 31.9 g/dL (ref 31.5–35.7)
MCV: 81 fL (ref 79–97)
MONOCYTES: 5 %
Monocytes Absolute: 0.6 10*3/uL (ref 0.1–0.9)
NEUTROS PCT: 63 %
Neutrophils Absolute: 6.7 10*3/uL (ref 1.4–7.0)
Platelets: 266 10*3/uL (ref 150–450)
RBC: 5.03 x10E6/uL (ref 3.77–5.28)
RDW: 13.3 % (ref 12.3–15.4)
WBC: 10.8 10*3/uL (ref 3.4–10.8)

## 2018-11-18 LAB — COMPREHENSIVE METABOLIC PANEL
ALBUMIN: 5.1 g/dL (ref 3.5–5.5)
ALT: 20 IU/L (ref 0–32)
AST: 14 IU/L (ref 0–40)
Albumin/Globulin Ratio: 2 (ref 1.2–2.2)
Alkaline Phosphatase: 114 IU/L (ref 39–117)
BUN/Creatinine Ratio: 24 — ABNORMAL HIGH (ref 9–23)
BUN: 11 mg/dL (ref 6–24)
Bilirubin Total: 0.4 mg/dL (ref 0.0–1.2)
CO2: 20 mmol/L (ref 20–29)
CREATININE: 0.46 mg/dL — AB (ref 0.57–1.00)
Calcium: 10.2 mg/dL (ref 8.7–10.2)
Chloride: 101 mmol/L (ref 96–106)
GFR calc Af Amer: 129 mL/min/{1.73_m2} (ref >59)
GFR, EST NON AFRICAN AMERICAN: 112 mL/min/{1.73_m2} (ref >59)
GLOBULIN, TOTAL: 2.5 g/dL (ref 1.5–4.5)
Glucose: 98 mg/dL (ref 65–99)
Potassium: 4.7 mmol/L (ref 3.5–5.2)
SODIUM: 142 mmol/L (ref 134–144)
Total Protein: 7.6 g/dL (ref 6.0–8.5)

## 2018-11-18 LAB — HEMOGLOBIN A1C
ESTIMATED AVERAGE GLUCOSE: 120 mg/dL
Hgb A1c MFr Bld: 5.8 % — ABNORMAL HIGH (ref 4.8–5.6)

## 2018-11-18 LAB — LIPID PANEL
CHOLESTEROL TOTAL: 185 mg/dL (ref 100–199)
Chol/HDL Ratio: 4.1 ratio (ref 0.0–4.4)
HDL: 45 mg/dL (ref >39)
LDL Calculated: 105 mg/dL — ABNORMAL HIGH (ref 0–99)
TRIGLYCERIDES: 177 mg/dL — AB (ref 0–149)
VLDL CHOLESTEROL CAL: 35 mg/dL (ref 5–40)

## 2018-11-19 ENCOUNTER — Encounter: Payer: Self-pay | Admitting: Family Medicine

## 2018-12-08 ENCOUNTER — Ambulatory Visit
Admission: RE | Admit: 2018-12-08 | Discharge: 2018-12-08 | Disposition: A | Discharge status: Home / Self Care | Payer: BLUE CROSS/BLUE SHIELD | Source: Ambulatory Visit | Attending: Obstetrics and Gynecology | Admitting: Obstetrics and Gynecology

## 2018-12-08 ENCOUNTER — Other Ambulatory Visit: Payer: Self-pay | Admitting: Obstetrics and Gynecology

## 2018-12-08 DIAGNOSIS — Z853 Personal history of malignant neoplasm of breast: Secondary | ICD-10-CM

## 2018-12-08 DIAGNOSIS — Z1231 Encounter for screening mammogram for malignant neoplasm of breast: Secondary | ICD-10-CM | POA: Diagnosis not present

## 2019-01-05 ENCOUNTER — Other Ambulatory Visit: Payer: Self-pay | Admitting: Family Medicine

## 2019-02-02 ENCOUNTER — Other Ambulatory Visit: Payer: Self-pay | Admitting: Family Medicine

## 2019-05-20 ENCOUNTER — Ambulatory Visit (INDEPENDENT_AMBULATORY_CARE_PROVIDER_SITE_OTHER): Payer: BC Managed Care – PPO | Admitting: Family Medicine

## 2019-05-20 ENCOUNTER — Encounter: Payer: Self-pay | Admitting: Family Medicine

## 2019-05-20 ENCOUNTER — Other Ambulatory Visit: Payer: Self-pay

## 2019-05-20 VITALS — BP 127/84 | HR 67 | Temp 97.4°F | Ht 66.0 in | Wt 235.6 lb

## 2019-05-20 DIAGNOSIS — C50411 Malignant neoplasm of upper-outer quadrant of right female breast: Secondary | ICD-10-CM

## 2019-05-20 DIAGNOSIS — F419 Anxiety disorder, unspecified: Secondary | ICD-10-CM | POA: Diagnosis not present

## 2019-05-20 DIAGNOSIS — E785 Hyperlipidemia, unspecified: Secondary | ICD-10-CM

## 2019-05-20 DIAGNOSIS — I10 Essential (primary) hypertension: Secondary | ICD-10-CM | POA: Diagnosis not present

## 2019-05-20 DIAGNOSIS — K219 Gastro-esophageal reflux disease without esophagitis: Secondary | ICD-10-CM

## 2019-05-20 DIAGNOSIS — Z171 Estrogen receptor negative status [ER-]: Secondary | ICD-10-CM

## 2019-05-20 DIAGNOSIS — R7303 Prediabetes: Secondary | ICD-10-CM | POA: Insufficient documentation

## 2019-05-20 DIAGNOSIS — Z6838 Body mass index (BMI) 38.0-38.9, adult: Secondary | ICD-10-CM

## 2019-05-20 MED ORDER — OMEPRAZOLE 20 MG PO CPDR: 20.0000 mg | DELAYED_RELEASE_CAPSULE | Freq: Every day | ORAL | 1 refills | Status: DC

## 2019-05-20 MED ORDER — ROSUVASTATIN CALCIUM 5 MG PO TABS: 5.0000 mg | ORAL_TABLET | Freq: Every day | ORAL | 1 refills | Status: DC

## 2019-05-20 MED ORDER — CARVEDILOL 12.5 MG PO TABS: 12.5000 mg | ORAL_TABLET | Freq: Two times a day (BID) | ORAL | 1 refills | Status: DC

## 2019-05-20 MED ORDER — LORAZEPAM 0.5 MG PO TABS: 0.5000 mg | ORAL_TABLET | Freq: Every day | ORAL | 0 refills | Status: DC | PRN

## 2019-05-20 NOTE — Progress Notes (Signed)
Virtual / live video office visit note for [employer], D.O- Primary Care Physician at [facility]   I connected with current patient today and beyond visually recognizing the correct individual, I verified that I am speaking with the correct person using two identifiers.  . Location of the patient: Home . Location of the provider: Office Only the patient (+/- their family members at pt's discretion) and myself were participating in the encounter    - This visit type was conducted due to national recommendations for restrictions regarding the COVID-19 Pandemic (e.g. social distancing) in an effort to limit this patient's exposure and mitigate transmission in our community.  This format is felt to be most appropriate for this patient at this time.   - The patient did have access to video technology today yet, we had technical difficulties with this method, requiring transitioning to audio only.    - No physical exam could be performed with this format, beyond that communicated to [place] by the patient/ family members as noted.   - Additionally my office staff/ schedulers discussed with the patient that there may be a monetary charge related to this service, depending on patient's medical insurance.   The patient expressed understanding, and agreed to proceed.      History of Present Illness:   Breast CA txmnts - all complete.  Just going for yearly screening mammograms now.    Hypertension:  Patient is maintained on Coreg 12.5 twice daily. - 133/87, 127/85,  137/83, 118/72- never  Over 140/90 - no sx   Hyperlipidemia:  Patient takes 5 mg of Crestor nightly  Patient reports very little compliance with low chol/ saturated and trans fat diet.    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   No other s-e  Last lipid panel as follows:  Lab Results  Component Value Date   CHOL 185 11/17/2018   HDL 45 11/17/2018   LDLCALC 105 (H) 11/17/2018   TRIG 177 (H) 11/17/2018   CHOLHDL 4.1 11/17/2018    Hepatic Function Latest Ref Rng & Units 11/17/2018 09/10/2018 11/11/2017  Total Protein 6.0 - 8.5 g/dL 7.6 7.5 7.5  Albumin 3.5 - 5.5 g/dL 5.1 4.1 4.7  AST 0 - 40 IU/L 14 13(L) 17  ALT 0 - 32 IU/L '20 17 17  '$ Alk Phosphatase 39 - 117 IU/L 114 118 107  Total Bilirubin 0.0 - 1.2 mg/dL 0.4 0.3 0.2      Generalized anxiety disorder:  Patient has h/o breast cancer and with the stress general life issues- best friend died end of 03-29-23 and covid stress.  Usually occasionally takes Ativan 2-3/ monthly- but recently more often due to inc. Stress-  one or two a week. -  11/11/17 was last script.     GERD:  on prilosec 20 qd;  Well controlled unless she eats too late or spicey foods    Pre-DM:  - maintaining wt, watching her sweets/ carbs      Impression and Recommendations:    1. Anxiety   2. Hyperlipidemia, unspecified hyperlipidemia type   3. Essential hypertension-  dx by [facility] Jan '14   4. Pre-diabetes   5. BMI 38.0-38.9,adult   6. Gastroesophageal reflux disease, esophagitis presence not specified   7. Malignant neoplasm of upper-outer quadrant of right breast in female, estrogen receptor negative ([place])     -Patient anxiety has been a little bit  bad lately, she has been taking more of the Ativan than usual however, she was given 60 tablets back in 12 of 2018 and right now is asking for refill.  In addition to meds we discussed dietary lifestyle modifications to help manage her stress.  Also, patient declines that she needs an everyday medicine such as an SSRI to help with this.  Benzodiazepine precautions discussed with patient  -Cholesterol: Controlled, continue meds.  Will obtain fasting blood work yearly- December 2020  Blood pressure: Well controlled at home.  Continue current medications.  Prediabetes: Reminded patient of dietary and lifestyle modifications necessary to prevent onset of  diabetes.  We will recheck A1c in 6 months  BMI counseling done  GERD well controlled continue meds  - As part of my medical decision making, I reviewed the following data within the [place] History obtained from pt /family, CMA notes reviewed and incorporated if applicable, Labs reviewed, Radiograph/ tests reviewed if applicable and OV notes from prior OV's with me, as well as other specialists she/he has seen since seeing me last, were all reviewed and used in my medical decision making process today.   - Additionally, discussion had with patient regarding txmnt plan, their biases about that plan etc were used in my medical decision making today.   - The patient agreed with the plan and demonstrated an understanding of the instructions.   No barriers to understanding were identified.   - Red flag symptoms and signs discussed in detail.  Patient expressed understanding regarding what to do in case of emergency\ urgent symptoms.  The patient was advised to call back or seek an in-person evaluation if the symptoms worsen or if the condition fails to improve as anticipated.   Return for December for complete physical exam with fasting blood work.    Meds ordered this encounter  Medications  . carvedilol (COREG) 12.5 MG tablet    Sig: Take 1 tablet (12.5 mg total) by mouth 2 (two) times daily with a meal.    Dispense:  180 tablet    Refill:  1  . LORazepam (ATIVAN) 0.5 MG tablet    Sig: Take 1 tablet (0.5 mg total) by mouth daily as needed for anxiety (only as needed panic attacks).    Dispense:  60 tablet    Refill:  0  . omeprazole (PRILOSEC) 20 MG capsule    Sig: Take 1 capsule (20 mg total) by mouth daily.    Dispense:  90 capsule    Refill:  1  . rosuvastatin (CRESTOR) 5 MG tablet    Sig: Take 1 tablet (5 mg total) by mouth daily. b-4 bed nightly    Dispense:  90 tablet    Refill:  1    Medications Discontinued During This Encounter  Medication Reason  . b  complex vitamins tablet Patient Preference  . carvedilol (COREG) 12.5 MG tablet Reorder  . LORazepam (ATIVAN) 0.5 MG tablet Reorder  . omeprazole (PRILOSEC) 20 MG capsule Reorder  . rosuvastatin (CRESTOR) 5 MG tablet Reorder      I provided 23 minutes of non-face-to-face time during this encounter,with over 50% of the time in direct counseling on patients medical conditions/ medical concerns.  Additional time was spent with charting and coordination of care after the actual visit commenced.   Note:  This note was prepared with assistance of Dragon voice recognition software. Occasional wrong-word or sound-a-like substitutions may have occurred due to the inherent limitations of voice recognition software.  Mellody Dance, DO     Patient Care Team    Relationship Specialty Notifications Start End  Mellody Dance, DO PCP - General Family Medicine  06/05/16   Magrinat, Virgie Dad, MD Consulting Physician Oncology  30-Dec-2016   Molli Posey, MD Consulting Physician Obstetrics and Gynecology  11/11/17     -Vitals obtained; medications/ allergies reconciled;  personal medical, social, Sx etc.histories were updated by CMA, reviewed by me and are reflected in chart  Patient Active Problem List   Diagnosis Date Noted  . 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 Essential hypertension-  dx by [facility] Jan '14 01/07/2013    Priority: High  . Hyperlipidemia- dx by [facility] Jan '14 01/07/2013    Priority: High  . Malignant neoplasm of upper-outer quadrant of right breast in female, estrogen receptor negative ([place]) 12/12/2012    Priority: High  . 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 BMI 38.0-38.9,adult 11/14/2016    Priority: Medium  . GERD (gastroesophageal reflux disease) 08/11/2014    Priority: Medium  . Anxiety 05/29/2014    Priority: Medium  . Family history of stomach cancer-  dad died age 48 December 30, 2016    Priority: Low  . S/P chemotherapy, time since greater than 12 weeks 11/14/2016    Priority: Low  . Fatigue 08/31/2015    Priority: Low  . Pre-diabetes 05/20/2019  . Lateral epicondylitis of left elbow 07/15/2018  . GAD (generalized anxiety disorder) 05/22/2017  . Heel pain 07/05/2016     Current Meds  Medication Sig  . carvedilol (COREG) 12.5 MG tablet Take 1 tablet (12.5 mg total) by mouth 2 (two) times daily with a meal.  . ibuprofen (ADVIL,MOTRIN) 200 MG tablet Take 400 mg by mouth every 6 (six) hours as needed. Pain  . LORazepam (ATIVAN) 0.5 MG tablet Take 1 tablet (0.5 mg total) by mouth daily as needed for anxiety (only as needed panic attacks).  Marland Kitchen omeprazole (PRILOSEC) 20 MG capsule Take 1 capsule (20 mg total) by mouth daily.  . rosuvastatin (CRESTOR) 5 MG tablet Take 1 tablet (5 mg total) by mouth daily. b-4 bed nightly  . [DISCONTINUED] carvedilol (COREG) 12.5 MG tablet Take 1 tablet (12.5 mg total) by mouth 2 (two) times daily with a meal.  . [DISCONTINUED] LORazepam (ATIVAN) 0.5 MG tablet Take 1 tablet (0.5 mg total) by mouth daily as needed for anxiety (only as needed panic attacks).  . [DISCONTINUED] omeprazole (PRILOSEC) 20 MG capsule TAKE 1 CAPSULE BY MOUTH DAILY.  . [DISCONTINUED] rosuvastatin (CRESTOR) 5 MG tablet TAKE 1 TABLET BY MOUTH DAILY.     Allergies  Allergen Reactions  . Amlodipine     Dizziness, lightheaded, breathing issues   . Chlorhexidine     Patient skin gets bright red with rash.      . Codeine Swelling  . Medralone [Methylprednisolone Acetate]     "coming out of skin"   . Oxycodone-Acetaminophen Other (See Comments)    Face got red and hot     ROS:  See above HPI for pertinent positives and negatives   Objective:   Blood pressure 127/84, pulse 67, temperature (!) 97.4 F (36.3 C), height '5\' 6"'$  (1.676 m), weight 235 lb 9.6 oz (106.9 kg), last menstrual period 06/24/2009.  (if some vitals are omitted,  this means that patient was UNABLE to obtain them even though they were asked to get them prior  to OV today.  They were asked to call us at their earliest convenience with these once obtained.)  General: A & O * 3; visually in no acute distress; in usual state of health.  Skin: Visible skin appears normal and pt's usual skin color HEENT:  EOMI, head is normocephalic and atraumatic.  Sclera are anicteric. Neck has a good range of motion.  Lips are noncyanotic Chest: normal chest excursion and movement Respiratory: speaking in full sentences, no conversational dyspnea; no use of accessory muscles Psych: insight good, mood- appears full

## 2019-07-17 ENCOUNTER — Other Ambulatory Visit: Payer: Self-pay | Admitting: Family Medicine

## 2019-07-17 DIAGNOSIS — F419 Anxiety disorder, unspecified: Secondary | ICD-10-CM

## 2019-07-20 NOTE — Telephone Encounter (Signed)
LOV 05/20/2019, medication was last filled 05/20/19 # 60 no refills. [place] controled substance database reviewed no aberrancies noted.  Please review and advise. MPulliam, CMA/RT(R)

## 2019-07-20 NOTE — Telephone Encounter (Signed)
Does patient have a controlled substance contract that is signed in up-to-date?   If she does not, she will need one.  She cannot have more than two-  30 d supplies of benzos or else contract needs to be put in place.  Period, no exceptions. This is our policy.   ( not sure how she got #60 )    I do not recommend refill of this medicine at this time.   If she still having anxiety, we will need to discuss a change to her treatment regimen with me.  Chronic lorazepam use/ daily basis is not a way to treat anxiety with chronic panic.    - If she feels she needs chronic lorazepam, I am happy to send her to a psychiatrist for further management, as we do not give these controlled substances chronically.

## 2019-07-22 ENCOUNTER — Telehealth: Payer: Self-pay | Admitting: Family Medicine

## 2019-07-22 NOTE — Telephone Encounter (Signed)
Patient wanted to call Melissa back and inform her that she did not request a refill of her Ativan, that all of her prescriptions are on auto renewal at [place] so that is how the request got sent. She states she still has 53/60 tabs that were issued a few months ago.

## 2019-07-22 NOTE — Telephone Encounter (Signed)
Called and spoke to the patient, per patient she did not request refill and is not needing a refill at this time.  Patient states that she is only the medication 1-2 times a week at the most. Patient counted medication while on the phone and states that she still has 53 pills left.  This must have been an automatic request from the pharmacy. .MPulliam, CMA/RT(R)

## 2019-07-22 NOTE — Telephone Encounter (Signed)
Noted MPulliam, CMA/RT(R)  

## 2019-07-27 ENCOUNTER — Telehealth: Payer: Self-pay | Admitting: Family Medicine

## 2019-07-27 ENCOUNTER — Other Ambulatory Visit: Payer: Self-pay

## 2019-07-27 DIAGNOSIS — Z20822 Contact with and (suspected) exposure to covid-19: Secondary | ICD-10-CM

## 2019-07-27 NOTE — Telephone Encounter (Signed)
Patient is requesting a COVID test order, since last week she has been experiencing fatigue, headaches, congestion, and sore throat. Please place order if applicable and let front office staff know when order is placed so they an contact patient please.

## 2019-07-27 NOTE — Telephone Encounter (Signed)
Please let pt know that order has been placed.  [name], CMA

## 2019-07-27 NOTE — Addendum Note (Signed)
Addended by: Fonnie Mu on: 07/27/2019 09:53 AM   Modules accepted: Orders

## 2019-07-28 LAB — SPECIMEN STATUS REPORT

## 2019-07-28 LAB — NOVEL CORONAVIRUS, NAA: SARS-CoV-2, NAA: NOT DETECTED

## 2019-11-02 ENCOUNTER — Encounter: Payer: Self-pay | Admitting: Family Medicine

## 2019-11-02 ENCOUNTER — Other Ambulatory Visit: Payer: Self-pay

## 2019-11-02 ENCOUNTER — Ambulatory Visit (INDEPENDENT_AMBULATORY_CARE_PROVIDER_SITE_OTHER): Payer: BC Managed Care – PPO | Admitting: Family Medicine

## 2019-11-02 VITALS — BP 142/84 | HR 70 | Temp 98.3°F | Resp 12 | Ht 66.0 in | Wt 235.3 lb

## 2019-11-02 DIAGNOSIS — Z0000 Encounter for general adult medical examination without abnormal findings: Secondary | ICD-10-CM

## 2019-11-02 DIAGNOSIS — E785 Hyperlipidemia, unspecified: Secondary | ICD-10-CM

## 2019-11-02 DIAGNOSIS — R5383 Other fatigue: Secondary | ICD-10-CM | POA: Diagnosis not present

## 2019-11-02 DIAGNOSIS — Z23 Encounter for immunization: Secondary | ICD-10-CM

## 2019-11-02 DIAGNOSIS — D508 Other iron deficiency anemias: Secondary | ICD-10-CM

## 2019-11-02 DIAGNOSIS — Z719 Counseling, unspecified: Secondary | ICD-10-CM

## 2019-11-02 DIAGNOSIS — I10 Essential (primary) hypertension: Secondary | ICD-10-CM | POA: Diagnosis not present

## 2019-11-02 DIAGNOSIS — R7303 Prediabetes: Secondary | ICD-10-CM | POA: Diagnosis not present

## 2019-11-02 NOTE — Patient Instructions (Signed)
Preventive Care for Adults, Female  A healthy lifestyle and preventive care can promote health and wellness. Preventive health guidelines for women include the following key practices.   A routine yearly physical is a good way to check with your health care provider about your health and preventive screening. It is a chance to share any concerns and updates on your health and to receive a thorough exam.   Visit your dentist for a routine exam and preventive care every 6 months. Brush your teeth twice a day and floss once a day. Good oral hygiene prevents tooth decay and gum disease.   The frequency of eye exams is based on your age, health, family medical history, use of contact lenses, and other factors. Follow your health care provider's recommendations for frequency of eye exams.   Eat a healthy diet. Foods like vegetables, fruits, whole grains, low-fat dairy products, and lean protein foods contain the nutrients you need without too many calories. Decrease your intake of foods high in solid fats, added sugars, and salt. Eat the right amount of calories for you.Get information about a proper diet from your health care provider, if necessary.   Regular physical exercise is one of the most important things you can do for your health. Most adults should get at least 150 minutes of moderate-intensity exercise (any activity that increases your heart rate and causes you to sweat) each week. In addition, most adults need muscle-strengthening exercises on 2 or more days a week.   Maintain a healthy weight. The body mass index (BMI) is a screening tool to identify possible weight problems. It provides an estimate of body fat based on height and weight. Your health care provider can find your BMI, and can help you achieve or maintain a healthy weight.For adults 20 years and older:   - A BMI below 18.5 is considered underweight.   - A BMI of 18.5 to 24.9 is normal.   - A BMI of 25 to 29.9 is  considered overweight.   - A BMI of 30 and above is considered obese.   Maintain normal blood lipids and cholesterol levels by exercising and minimizing your intake of trans and saturated fats.  Eat a balanced diet with plenty of fruit and vegetables. Blood tests for lipids and cholesterol should begin at age 65 and be repeated every 5 years minimum.  If your lipid or cholesterol levels are high, you are over 40, or you are at high risk for heart disease, you may need your cholesterol levels checked more frequently.Ongoing high lipid and cholesterol levels should be treated with medicines if diet and exercise are not working.   If you smoke, find out from your health care provider how to quit. If you do not use tobacco, do not start.   Lung cancer screening is recommended for adults aged 10-80 years who are at high risk for developing lung cancer because of a history of smoking. A yearly low-dose CT scan of the lungs is recommended for people who have at least a 30-pack-year history of smoking and are a current smoker or have quit within the past 15 years. A pack year of smoking is smoking an average of 1 pack of cigarettes a day for 1 year (for example: 1 pack a day for 30 years or 2 packs a day for 15 years). Yearly screening should continue until the smoker has stopped smoking for at least 15 years. Yearly screening should be stopped for people who develop a  health problem that would prevent them from having lung cancer treatment.   If you are pregnant, do not drink alcohol. If you are breastfeeding, be very cautious about drinking alcohol. If you are not pregnant and choose to drink alcohol, do not have more than 1 drink per day. One drink is considered to be 12 ounces (355 mL) of beer, 5 ounces (148 mL) of wine, or 1.5 ounces (44 mL) of liquor.   Avoid use of street drugs. Do not share needles with anyone. Ask for help if you need support or instructions about stopping the use of  drugs.   High blood pressure causes heart disease and increases the risk of stroke. Your blood pressure should be checked at least yearly.  Ongoing high blood pressure should be treated with medicines if weight loss and exercise do not work.   If you are 25-14 years old, ask your health care provider if you should take aspirin to prevent strokes.   Diabetes screening involves taking a blood sample to check your fasting blood sugar level. This should be done once every 3 years, after age 48, if you are within normal weight and without risk factors for diabetes. Testing should be considered at a younger age or be carried out more frequently if you are overweight and have at least 1 risk factor for diabetes.   Breast cancer screening is essential preventive care for women. You should practice "breast self-awareness."  This means understanding the normal appearance and feel of your breasts and may include breast self-examination.  Any changes detected, no matter how small, should be reported to a health care provider.  Women in their 89s and 30s should have a clinical breast exam (CBE) by a health care provider as part of a regular health exam every 1 to 3 years.  After age 63, women should have a CBE every year.  Starting at age 81, women should consider having a mammogram (breast X-ray test) every year.  Women who have a family history of breast cancer should talk to their health care provider about genetic screening.  Women at a high risk of breast cancer should talk to their health care providers about having an MRI and a mammogram every year.   -Breast cancer gene (BRCA)-related cancer risk assessment is recommended for women who have family members with BRCA-related cancers. BRCA-related cancers include breast, ovarian, tubal, and peritoneal cancers. Having family members with these cancers may be associated with an increased risk for harmful changes (mutations) in the breast cancer genes BRCA1 and  BRCA2. Results of the assessment will determine the need for genetic counseling and BRCA1 and BRCA2 testing.   The Pap test is a screening test for cervical cancer. A Pap test can show cell changes on the cervix that might become cervical cancer if left untreated. A Pap test is a procedure in which cells are obtained and examined from the lower end of the uterus (cervix).   - Women should have a Pap test starting at age 13.   - Between ages 51 and 22, Pap tests should be repeated every 2 years.   - Beginning at age 70, you should have a Pap test every 3 years as long as the past 3 Pap tests have been normal.   - Some women have medical problems that increase the chance of getting cervical cancer. Talk to your health care provider about these problems. It is especially important to talk to your health care provider if a  new problem develops soon after your last Pap test. In these cases, your health care provider may recommend more frequent screening and Pap tests.   - The above recommendations are the same for women who have or have not gotten the vaccine for human papillomavirus (HPV).   - If you had a hysterectomy for a problem that was not cancer or a condition that could lead to cancer, then you no longer need Pap tests. Even if you no longer need a Pap test, a regular exam is a good idea to make sure no other problems are starting.   - If you are between ages 8 and 94 years, and you have had normal Pap tests going back 10 years, you no longer need Pap tests. Even if you no longer need a Pap test, a regular exam is a good idea to make sure no other problems are starting.   - If you have had past treatment for cervical cancer or a condition that could lead to cancer, you need Pap tests and screening for cancer for at least 20 years after your treatment.   - If Pap tests have been discontinued, risk factors (such as a new sexual partner) need to be reassessed to determine if screening should  be resumed.   - The HPV test is an additional test that may be used for cervical cancer screening. The HPV test looks for the virus that can cause the cell changes on the cervix. The cells collected during the Pap test can be tested for HPV. The HPV test could be used to screen women aged 9 years and older, and should be used in women of any age who have unclear Pap test results. After the age of 48, women should have HPV testing at the same frequency as a Pap test.   Colorectal cancer can be detected and often prevented. Most routine colorectal cancer screening begins at the age of 94 years and continues through age 56 years. However, your health care provider may recommend screening at an earlier age if you have risk factors for colon cancer. On a yearly basis, your health care provider may provide home test kits to check for hidden blood in the stool.  Use of a small camera at the end of a tube, to directly examine the colon (sigmoidoscopy or colonoscopy), can detect the earliest forms of colorectal cancer. Talk to your health care provider about this at age 47, when routine screening begins. Direct exam of the colon should be repeated every 5 -10 years through age 65 years, unless early forms of pre-cancerous polyps or small growths are found.   People who are at an increased risk for hepatitis B should be screened for this virus. You are considered at high risk for hepatitis B if:  -You were born in a country where hepatitis B occurs often. Talk with your health care provider about which countries are considered high risk.  - Your parents were born in a high-risk country and you have not received a shot to protect against hepatitis B (hepatitis B vaccine).  - You have HIV or AIDS.  - You use needles to inject street drugs.  - You live with, or have sex with, someone who has Hepatitis B.  - You get hemodialysis treatment.  - You take certain medicines for conditions like cancer, organ  transplantation, and autoimmune conditions.   Hepatitis C blood testing is recommended for all people born from [birth year] through 1965 and any individual  with known risks for hepatitis C.   Practice safe sex. Use condoms and avoid high-risk sexual practices to reduce the spread of sexually transmitted infections (STIs). STIs include gonorrhea, chlamydia, syphilis, trichomonas, herpes, HPV, and human immunodeficiency virus (HIV). Herpes, HIV, and HPV are viral illnesses that have no cure. They can result in disability, cancer, and death. Sexually active women aged 17 years and younger should be checked for chlamydia. Older women with new or multiple partners should also be tested for chlamydia. Testing for other STIs is recommended if you are sexually active and at increased risk.   Osteoporosis is a disease in which the bones lose minerals and strength with aging. This can result in serious bone fractures or breaks. The risk of osteoporosis can be identified using a bone density scan. Women ages 38 years and over and women at risk for fractures or osteoporosis should discuss screening with their health care providers. Ask your health care provider whether you should take a calcium supplement or vitamin D to There are also several preventive steps women can take to avoid osteoporosis and resulting fractures or to keep osteoporosis from worsening. -->Recommendations include:  Eat a balanced diet high in fruits, vegetables, calcium, and vitamins.  Get enough calcium. The recommended total intake of is 1,200 mg daily; for best absorption, if taking supplements, divide doses into 250-500 mg doses throughout the day. Of the two types of calcium, calcium carbonate is best absorbed when taken with food but calcium citrate can be taken on an empty stomach.  Get enough vitamin D. [employer] and the [place] recommend at least 1,000 IU per day for women age 28 and over who are at risk of vitamin D  deficiency. Vitamin D deficiency can be caused by inadequate sun exposure (for example, those who live in [place]).  Avoid alcohol and smoking. Heavy alcohol intake (more than 7 drinks per week) increases the risk of falls and hip fracture and women smokers tend to lose bone more rapidly and have lower bone mass than nonsmokers. Stopping smoking is one of the most important changes women can make to improve their health and decrease risk for disease.  Be physically active every day. Weight-bearing exercise (for example, fast walking, hiking, jogging, and weight training) may strengthen bones or slow the rate of bone loss that comes with aging. Balancing and muscle-strengthening exercises can reduce the risk of falling and fracture.  Consider therapeutic medications. Currently, several types of effective drugs are available. Healthcare providers can recommend the type most appropriate for each woman.  Eliminate environmental factors that may contribute to accidents. Falls cause nearly 90% of all osteoporotic fractures, so reducing this risk is an important bone-health strategy. Measures include ample lighting, removing obstructions to walking, using nonskid rugs on floors, and placing mats and/or grab bars in showers.  Be aware of medication side effects. Some common medicines make bones weaker. These include a type of steroid drug called glucocorticoids used for arthritis and asthma, some antiseizure drugs, certain sleeping pills, treatments for endometriosis, and some cancer drugs. An overactive thyroid gland or using too much thyroid hormone for an underactive thyroid can also be a problem. If you are taking these medicines, talk to your doctor about what you can do to help protect your bones.reduce the rate of osteoporosis.    Menopause can be associated with physical symptoms and risks. Hormone replacement therapy is available to decrease symptoms and risks. You should talk to your  health care provider  about whether hormone replacement therapy is right for you.   Use sunscreen. Apply sunscreen liberally and repeatedly throughout the day. You should seek shade when your shadow is shorter than you. Protect yourself by wearing long sleeves, pants, a wide-brimmed hat, and sunglasses year round, whenever you are outdoors.   Once a month, do a whole body skin exam, using a mirror to look at the skin on your back. Tell your health care provider of new moles, moles that have irregular borders, moles that are larger than a pencil eraser, or moles that have changed in shape or color.   -Stay current with required vaccines (immunizations).   Influenza vaccine. All adults should be immunized every year.  Tetanus, diphtheria, and acellular pertussis (Td, Tdap) vaccine. Pregnant women should receive 1 dose of Tdap vaccine during each pregnancy. The dose should be obtained regardless of the length of time since the last dose. Immunization is preferred during the 27th 36th week of gestation. An adult who has not previously received Tdap or who does not know her vaccine status should receive 1 dose of Tdap. This initial dose should be followed by tetanus and diphtheria toxoids (Td) booster doses every 10 years. Adults with an unknown or incomplete history of completing a 3-dose immunization series with Td-containing vaccines should begin or complete a primary immunization series including a Tdap dose. Adults should receive a Td booster every 10 years.  Varicella vaccine. An adult without evidence of immunity to varicella should receive 2 doses or a second dose if she has previously received 1 dose. Pregnant females who do not have evidence of immunity should receive the first dose after pregnancy. This first dose should be obtained before leaving the health care facility. The second dose should be obtained 4 8 weeks after the first dose.  Human papillomavirus (HPV) vaccine. Females aged 13 26  years who have not received the vaccine previously should obtain the 3-dose series. The vaccine is not recommended for use in pregnant females. However, pregnancy testing is not needed before receiving a dose. If a female is found to be pregnant after receiving a dose, no treatment is needed. In that case, the remaining doses should be delayed until after the pregnancy. Immunization is recommended for any person with an immunocompromised condition through the age of 26 years if she did not get any or all doses earlier. During the 3-dose series, the second dose should be obtained 4 8 weeks after the first dose. The third dose should be obtained 24 weeks after the first dose and 16 weeks after the second dose.  Zoster vaccine. One dose is recommended for adults aged 60 years or older unless certain conditions are present.  Measles, mumps, and rubella (MMR) vaccine. Adults born before 1957 generally are considered immune to measles and mumps. Adults born in 1957 or later should have 1 or more doses of MMR vaccine unless there is a contraindication to the vaccine or there is laboratory evidence of immunity to each of the three diseases. A routine second dose of MMR vaccine should be obtained at least 28 days after the first dose for students attending postsecondary schools, health care workers, or international travelers. People who received inactivated measles vaccine or an unknown type of measles vaccine during 1963 1967 should receive 2 doses of MMR vaccine. People who received inactivated mumps vaccine or an unknown type of mumps vaccine before 1979 and are at high risk for mumps infection should consider immunization with 2 doses of   MMR vaccine. For females of childbearing age, rubella immunity should be determined. If there is no evidence of immunity, females who are not pregnant should be vaccinated. If there is no evidence of immunity, females who are pregnant should delay immunization until after pregnancy.  Unvaccinated health care workers born before 1957 who lack laboratory evidence of measles, mumps, or rubella immunity or laboratory confirmation of disease should consider measles and mumps immunization with 2 doses of MMR vaccine or rubella immunization with 1 dose of MMR vaccine.  Pneumococcal 13-valent conjugate (PCV13) vaccine. When indicated, a person who is uncertain of her immunization history and has no record of immunization should receive the PCV13 vaccine. An adult aged 19 years or older who has certain medical conditions and has not been previously immunized should receive 1 dose of PCV13 vaccine. This PCV13 should be followed with a dose of pneumococcal polysaccharide (PPSV23) vaccine. The PPSV23 vaccine dose should be obtained at least 8 weeks after the dose of PCV13 vaccine. An adult aged 19 years or older who has certain medical conditions and previously received 1 or more doses of PPSV23 vaccine should receive 1 dose of PCV13. The PCV13 vaccine dose should be obtained 1 or more years after the last PPSV23 vaccine dose.  Pneumococcal polysaccharide (PPSV23) vaccine. When PCV13 is also indicated, PCV13 should be obtained first. All adults aged 65 years and older should be immunized. An adult younger than age 65 years who has certain medical conditions should be immunized. Any person who resides in a nursing home or long-term care facility should be immunized. An adult smoker should be immunized. People with an immunocompromised condition and certain other conditions should receive both PCV13 and PPSV23 vaccines. People with human immunodeficiency virus (HIV) infection should be immunized as soon as possible after diagnosis. Immunization during chemotherapy or radiation therapy should be avoided. Routine use of PPSV23 vaccine is not recommended for American Indians, [place] Natives, or people younger than 65 years unless there are medical conditions that require PPSV23 vaccine. When indicated,  people who have unknown immunization and have no record of immunization should receive PPSV23 vaccine. One-time revaccination 5 years after the first dose of PPSV23 is recommended for people aged 19 64 years who have chronic kidney failure, nephrotic syndrome, asplenia, or immunocompromised conditions. People who received 1 2 doses of PPSV23 before age 65 years should receive another dose of PPSV23 vaccine at age 65 years or later if at least 5 years have passed since the previous dose. Doses of PPSV23 are not needed for people immunized with PPSV23 at or after age 65 years.  Meningococcal vaccine. Adults with asplenia or persistent complement component deficiencies should receive 2 doses of quadrivalent meningococcal conjugate (MenACWY-D) vaccine. The doses should be obtained at least 2 months apart. Microbiologists working with certain meningococcal bacteria, military recruits, people at risk during an outbreak, and people who travel to or live in countries with a high rate of meningitis should be immunized. A first-year college student up through age 21 years who is living in a residence hall should receive a dose if she did not receive a dose on or after her 16th birthday. Adults who have certain high-risk conditions should receive one or more doses of vaccine.  Hepatitis A vaccine. Adults who wish to be protected from this disease, have certain high-risk conditions, work with hepatitis A-infected animals, work in hepatitis A research labs, or travel to or work in countries with a high rate of hepatitis A should be   immunized. Adults who were previously unvaccinated and who anticipate close contact with an international adoptee during the first 60 days after arrival in the [place] from a country with a high rate of hepatitis A should be immunized.  Hepatitis B vaccine.  Adults who wish to be protected from this disease, have certain high-risk conditions, may be exposed to blood or other infectious  body fluids, are household contacts or sex partners of hepatitis B positive people, are clients or workers in certain care facilities, or travel to or work in countries with a high rate of hepatitis B should be immunized.  Haemophilus influenzae type b (Hib) vaccine. A previously unvaccinated person with asplenia or sickle cell disease or having a scheduled splenectomy should receive 1 dose of Hib vaccine. Regardless of previous immunization, a recipient of a hematopoietic stem cell transplant should receive a 3-dose series 6 12 months after her successful transplant. Hib vaccine is not recommended for adults with HIV infection.  Preventive Services / Frequency Ages 19 to 39years  Blood pressure check.** / Every 1 to 2 years.  Lipid and cholesterol check.** / Every 5 years beginning at age 20.  Clinical breast exam.** / Every 3 years for women in their 20s and 30s.  BRCA-related cancer risk assessment.** / For women who have family members with a BRCA-related cancer (breast, ovarian, tubal, or peritoneal cancers).  Pap test.** / Every 2 years from ages 21 through 29. Every 3 years starting at age 30 through age 65 or 70 with a history of 3 consecutive normal Pap tests.  HPV screening.** / Every 3 years from ages 30 through ages 65 to 70 with a history of 3 consecutive normal Pap tests.  Hepatitis C blood test.** / For any individual with known risks for hepatitis C.  Skin self-exam. / Monthly.  Influenza vaccine. / Every year.  Tetanus, diphtheria, and acellular pertussis (Tdap, Td) vaccine.** / Consult your health care provider. Pregnant women should receive 1 dose of Tdap vaccine during each pregnancy. 1 dose of Td every 10 years.  Varicella vaccine.** / Consult your health care provider. Pregnant females who do not have evidence of immunity should receive the first dose after pregnancy.  HPV vaccine. / 3 doses over 6 months, if 26 and younger. The vaccine is not recommended for use in  pregnant females. However, pregnancy testing is not needed before receiving a dose.  Measles, mumps, rubella (MMR) vaccine.** / You need at least 1 dose of MMR if you were born in 1957 or later. You may also need a 2nd dose. For females of childbearing age, rubella immunity should be determined. If there is no evidence of immunity, females who are not pregnant should be vaccinated. If there is no evidence of immunity, females who are pregnant should delay immunization until after pregnancy.  Pneumococcal 13-valent conjugate (PCV13) vaccine.** / Consult your health care provider.  Pneumococcal polysaccharide (PPSV23) vaccine.** / 1 to 2 doses if you smoke cigarettes or if you have certain conditions.  Meningococcal vaccine.** / 1 dose if you are age 19 to 21 years and a first-year college student living in a residence hall, or have one of several medical conditions, you need to get vaccinated against meningococcal disease. You may also need additional booster doses.  Hepatitis A vaccine.** / Consult your health care provider.  Hepatitis B vaccine.** / Consult your health care provider.  Haemophilus influenzae type b (Hib) vaccine.** / Consult your health care provider.  Ages 40 to 64years    Blood pressure check.** / Every 1 to 2 years.  Lipid and cholesterol check.** / Every 5 years beginning at age 22 years.  Lung cancer screening. / Every year if you are aged 81 80 years and have a 30-pack-year history of smoking and currently smoke or have quit within the past 15 years. Yearly screening is stopped once you have quit smoking for at least 15 years or develop a health problem that would prevent you from having lung cancer treatment.  Clinical breast exam.** / Every year after age 100 years.  BRCA-related cancer risk assessment.** / For women who have family members with a BRCA-related cancer (breast, ovarian, tubal, or peritoneal cancers).  Mammogram.** / Every year beginning at age 74  years and continuing for as long as you are in good health. Consult with your health care provider.  Pap test.** / Every 3 years starting at age 28 years through age 30 or 38 years with a history of 3 consecutive normal Pap tests.  HPV screening.** / Every 3 years from ages 85 years through ages 91 to 58 years with a history of 3 consecutive normal Pap tests.  Fecal occult blood test (FOBT) of stool. / Every year beginning at age 60 years and continuing until age 55 years. You may not need to do this test if you get a colonoscopy every 10 years.  Flexible sigmoidoscopy or colonoscopy.** / Every 5 years for a flexible sigmoidoscopy or every 10 years for a colonoscopy beginning at age 3 years and continuing until age 56 years.  Hepatitis C blood test.** / For all people born from [birth year] through 1964 and any individual with known risks for hepatitis C.  Skin self-exam. / Monthly.  Influenza vaccine. / Every year.  Tetanus, diphtheria, and acellular pertussis (Tdap/Td) vaccine.** / Consult your health care provider. Pregnant women should receive 1 dose of Tdap vaccine during each pregnancy. 1 dose of Td every 10 years.  Varicella vaccine.** / Consult your health care provider. Pregnant females who do not have evidence of immunity should receive the first dose after pregnancy.  Zoster vaccine.** / 1 dose for adults aged 50 years or older.  Measles, mumps, rubella (MMR) vaccine.** / You need at least 1 dose of MMR if you were born in 1957 or later. You may also need a 2nd dose. For females of childbearing age, rubella immunity should be determined. If there is no evidence of immunity, females who are not pregnant should be vaccinated. If there is no evidence of immunity, females who are pregnant should delay immunization until after pregnancy.  Pneumococcal 13-valent conjugate (PCV13) vaccine.** / Consult your health care provider.  Pneumococcal polysaccharide (PPSV23) vaccine.** / 1 to 2 doses if  you smoke cigarettes or if you have certain conditions.  Meningococcal vaccine.** / Consult your health care provider.  Hepatitis A vaccine.** / Consult your health care provider.  Hepatitis B vaccine.** / Consult your health care provider.  Haemophilus influenzae type b (Hib) vaccine.** / Consult your health care provider.  Ages 47 years and over  Blood pressure check.** / Every 1 to 2 years.  Lipid and cholesterol check.** / Every 5 years beginning at age 55 years.  Lung cancer screening. / Every year if you are aged 57 80 years and have a 30-pack-year history of smoking and currently smoke or have quit within the past 15 years. Yearly screening is stopped once you have quit smoking for at least 15 years or develop a health problem that  would prevent you from having lung cancer treatment.  Clinical breast exam.** / Every year after age 68 years.  BRCA-related cancer risk assessment.** / For women who have family members with a BRCA-related cancer (breast, ovarian, tubal, or peritoneal cancers).  Mammogram.** / Every year beginning at age 47 years and continuing for as long as you are in good health. Consult with your health care provider.  Pap test.** / Every 3 years starting at age 59 years through age 26 or 26 years with 3 consecutive normal Pap tests. Testing can be stopped between 65 and 70 years with 3 consecutive normal Pap tests and no abnormal Pap or HPV tests in the past 10 years.  HPV screening.** / Every 3 years from ages 71 years through ages 19 or 96 years with a history of 3 consecutive normal Pap tests. Testing can be stopped between 65 and 70 years with 3 consecutive normal Pap tests and no abnormal Pap or HPV tests in the past 10 years.  Fecal occult blood test (FOBT) of stool. / Every year beginning at age 59 years and continuing until age 7 years. You may not need to do this test if you get a colonoscopy every 10 years.  Flexible sigmoidoscopy or colonoscopy.** /  Every 5 years for a flexible sigmoidoscopy or every 10 years for a colonoscopy beginning at age 28 years and continuing until age 27 years.  Hepatitis C blood test.** / For all people born from [birth year] through 1964 and any individual with known risks for hepatitis C.  Osteoporosis screening.** / A one-time screening for women ages 6 years and over and women at risk for fractures or osteoporosis.  Skin self-exam. / Monthly.  Influenza vaccine. / Every year.  Tetanus, diphtheria, and acellular pertussis (Tdap/Td) vaccine.** / 1 dose of Td every 10 years.  Varicella vaccine.** / Consult your health care provider.  Zoster vaccine.** / 1 dose for adults aged 20 years or older.  Pneumococcal 13-valent conjugate (PCV13) vaccine.** / Consult your health care provider.  Pneumococcal polysaccharide (PPSV23) vaccine.** / 1 dose for all adults aged 95 years and older.  Meningococcal vaccine.** / Consult your health care provider.  Hepatitis A vaccine.** / Consult your health care provider.  Hepatitis B vaccine.** / Consult your health care provider.  Haemophilus influenzae type b (Hib) vaccine.** / Consult your health care provider. ** Family history and personal history of risk and conditions may change your health care provider's recommendations. Document Released: 01/22/2002 Document Revised: 09/16/2013  [facility] Patient Information 2014 [place], [place].   EXERCISE AND DIET:  We recommended that you start or continue a regular exercise program for good health. Regular exercise means any activity that makes your heart beat faster and makes you sweat.  We recommend exercising at least 30 minutes per day at least 3 days a week, preferably 5.  We also recommend a diet low in fat and sugar / carbohydrates.  Inactivity, poor dietary choices and obesity can cause diabetes, heart attack, stroke, and kidney damage, among others.     ALCOHOL AND SMOKING:  Women should limit their alcohol intake to no  more than 7 drinks/beers/glasses of wine (combined, not each!) per week. Moderation of alcohol intake to this level decreases your risk of breast cancer and liver damage.  ( And of course, no recreational drugs are part of a healthy lifestyle.)  Also, you should not be smoking at all or even being exposed to second hand smoke. Most people know smoking can  cause cancer, and various heart and lung diseases, but did you know it also contributes to weakening of your bones?  Aging of your skin?  Yellowing of your teeth and nails?   CALCIUM AND VITAMIN D:  Adequate intake of calcium and Vitamin D are recommended.  The recommendations for exact amounts of these supplements seem to change often, but generally speaking 600 mg of calcium (either carbonate or citrate) and 800 units of Vitamin D per day seems prudent. Certain women may benefit from higher intake of Vitamin D.  If you are among these women, your doctor will have told you during your visit.     PAP SMEARS:  Pap smears, to check for cervical cancer or precancers,  have traditionally been done yearly, although recent scientific advances have shown that most women can have pap smears less often.  However, every woman still should have a physical exam from her gynecologist or primary care physician every year. It will include a breast check, inspection of the vulva and vagina to check for abnormal growths or skin changes, a visual exam of the cervix, and then an exam to evaluate the size and shape of the uterus and ovaries.  And after 57 years of age, a rectal exam is indicated to check for rectal cancers. We will also provide age appropriate advice regarding health maintenance, like when you should have certain vaccines, screening for sexually transmitted diseases, bone density testing, colonoscopy, mammograms, etc.    MAMMOGRAMS:  All women over 40 years old should have a yearly mammogram. Many facilities now offer a "3D" mammogram, which may cost  around $50 extra out of pocket. If possible,  we recommend you accept the option to have the 3D mammogram performed.  It both reduces the number of women who will be called back for extra views which then turn out to be normal, and it is better than the routine mammogram at detecting truly abnormal areas.     COLONOSCOPY:  Colonoscopy to screen for colon cancer is recommended for all women at age 27.  We know, you hate the idea of the prep.  We agree, BUT, having colon cancer and not knowing it is worse!!  Colon cancer so often starts as a polyp that can be seen and removed at colonscopy, which can quite literally save your life!  And if your first colonoscopy is normal and you have no family history of colon cancer, most women don't have to have it again for 10 years.  Once every ten years, you can do something that may end up saving your life, right?  We will be happy to help you get it scheduled when you are ready.  Be sure to check your insurance coverage so you understand how much it will cost.  It may be covered as a preventative service at no cost, but you should check your particular policy.

## 2019-11-02 NOTE — Progress Notes (Signed)
Impression and Recommendations:    1. Encounter for wellness examination   2. Health education/counseling   3. Essential hypertension-  dx by Cards Jan '14   4. Hyperlipidemia, unspecified hyperlipidemia type   5. Fatigue, unspecified type   6. Other iron deficiency anemia   7. Pre-diabetes   8. Need for influenza vaccination     1) Anticipatory Guidance: Discussed importance of wearing a seatbelt while driving, not texting while driving; sunscreen when outside along with yearly skin surveillance; eating a well balanced and modest diet; physical activity at least 25 minutes per day or 150 min/ week of moderate to intense activity.  - Reviewed the A,B,C,D's of prudent skin surveillance with patient today.  - Reviewed goal blood pressure of less than 135/85.  2) Immunizations / Screenings / Labs:   All immunizations and screenings that patient agrees to, are up-to-date per recommendations or will be updated today.  Patient understands the needs for q 6mo dental and yearly vision screens which pt will schedule independently. Obtain CBC, CMP, HgA1c, Lipid panel, TSH and vit D when fasting if not already done recently.   - Full fasting lab work drawn today.  See orders.  - Due for mammogram in near future, January 2021.  Last obtained December 2019.  - Last pap smear obtained in March of 2018; negative.  - Colonoscopy last obtained in 2015 with 10 year repeat.  - TDAP up to date.  Reviewed guidelines for repeat.  - Discussed need for low risk Hep C/HIV screen.  Patient declined.  - Need for influenza vaccination.  - Need for shingles vaccine.  Patient declines today.  Information sheet provided.  - Encouraged patient to visit the CDC website for further information on recommended immunizations.  - Extensively reviewed recommendations with patient today.  Education provided and all questions answered.  3) Weight:   Discussed goal of losing even 5-10% of current body  weight which would improve overall feelings of well being and improve objective health data significantly.   Improve nutrient density of diet through increasing intake of fruits and vegetables and decreasing saturated/trans fats, white flour products and refined sugar products.   - Novel Covid -19 counseling done; all questions were answered.   - Current CDC / federal and [place] guidelines reviewed with patient  - Reminded pt of extreme importance of social distancing; wearing a mask when out in public; insensate handwashing and cleaning of surfaces, avoiding unnecessary trips for shopping and avoiding ALL but emergency appts etc. - Told patient to be prepared, not scared; and be smart for the sake of others - Patient will call with any additional concerns   Orders Placed This Encounter  Procedures  . Flu Vaccine QUAD 6+ mos PF IM (Fluarix Quad PF)  . CMP (comprehensive metabolic panel)  . Lipid panel  . VITAMIN D 25 Hydroxy (Vit-D Deficiency, Fractures)  . Hemoglobin A1c    Return for f/up 6 months Bp, HLD, wt.    Gross side effects, risk and benefits, and alternatives of medications discussed with patient.  Patient is aware that all medications have potential side effects and we are unable to predict every side effect or drug-drug interaction that may occur.  Expresses verbal understanding and consents to current therapy plan and treatment regimen.  Please see orders placed and AVS handed out to patient at the end of our visit for further patient instructions/ counseling done pertaining to today's office visit.  This document serves as  a record of services personally performed by Mellody Dance, DO. It was created on her behalf by Toni Amend, a trained medical scribe. The creation of this record is based on the scribe's personal observations and the provider's statements to them.   This case required medical decision making of at least moderate complexity. The above  documentation has been reviewed to be accurate and was completed by Marjory Sneddon, D.O.     Subjective:    I, Toni Amend, am serving as scribe for Dr. Mellody Dance.  Chief Complaint  Patient presents with  . Annual Exam   HPI: Julia Zuniga is a 58 y.o. female who [facility] to [facility] today a yearly health maintenance exam.  Health Maintenance Summary Reviewed and updated, unless pt declines services.  Colonoscopy:  Last obtained in 2015 with 10 year repeat. No family history of colon cancer. Tobacco History Reviewed:   Y; never smoker. Alcohol:    No concerns, no excessive use Exercise Habits:  20-30 minutes per day.  Notes she walks Julia Zuniga, the dog, and has recently lost 4 lbs. STD concerns:   none Drug Use:   None Birth control method:   n/a Menses regular:     n/a Lumps or breast concerns:      no Breast Cancer Family History:  Patient had personal history of estrogen receptor negative breast cancer and received chemotherapy in the past.  She follows up with Dr. Jana Hakim.  Says she's been good lately.  "Just staying home."  [place] and have been spending time outside.  - Visual Health Thinks her last dilated eye exam was two years ago.  - Dental Health Notes doesn't go to the dentist very often due to lack of dental insurance.  - Dermatological Health She does not follow up with dermatology.  Denies new or changing spots. Notes her mother had a "skin cancer on her nose," so she does watch for concerns.  Denies chest pain, SOB, dizziness, nausea, vomiting, diarrhea, constipation.   Immunization History  Administered Date(s) Administered  . Influenza,inj,Quad PF,6+ Mos 11/14/2016, 11/11/2017, 11/17/2018, 11/02/2019  . Tdap 05/16/2011    Health Maintenance  Topic Date Due  . INFLUENZA VACCINE  07/11/2019  . Hepatitis C Screening  11/18/2019 (Originally August 26, 1961)  . HIV Screening   05/22/2027 (Originally 09/04/1977)  . PAP SMEAR-Modifier  02/20/2020  . MAMMOGRAM  12/08/2020  . TETANUS/TDAP  05/15/2021  . COLONOSCOPY  03/26/2024     Wt Readings from Last 3 Encounters:  11/02/19 235 lb 4.8 oz (106.7 kg)  05/20/19 235 lb 9.6 oz (106.9 kg)  11/17/18 238 lb 12.8 oz (108.3 kg)   BP Readings from Last 3 Encounters:  11/02/19 (!) 142/84  05/20/19 127/84  11/17/18 121/86   Pulse Readings from Last 3 Encounters:  11/02/19 70  05/20/19 67  11/17/18 74     Past Medical History:  Diagnosis Date  . Anemia   . Anxiety   . Breast cancer ([place]) 12/08/12   Right  . Diverticulitis    2012  . Diverticulosis   . GERD (gastroesophageal reflux disease)   . Heartburn   . 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 History of radiation therapy 05/13/13-06/18/13   right breast, 5000 cGy 25 sessions  . Hot flashes   . Hyperlipidemia   . Hypertension   . Personal history of chemotherapy   . Personal history  of radiation therapy   . PONV (postoperative nausea and vomiting)       Past Surgical History:  Procedure Laterality Date  . BREAST BIOPSY    . BREAST LUMPECTOMY Right 12/31/2012  . BREAST LUMPECTOMY WITH SENTINEL LYMPH NODE BIOPSY  12/31/2012   Procedure: BREAST LUMPECTOMY WITH SENTINEL LYMPH NODE BX;  Surgeon: Stark Klein, MD;  Location: [facility];  Service: General;  Laterality: Right;  . CESAREAN SECTION     x 3  . CHOLECYSTECTOMY  2001   lap choli  . PORT-A-CATH REMOVAL Left 02/24/2014   Procedure: REMOVAL PORT-A-CATH;  Surgeon: Stark Klein, MD;  Location: [facility];  Service: General;  Laterality: Left;  . PORTACATH PLACEMENT  12/31/2012   Procedure: INSERTION PORT-A-CATH;  Surgeon: Stark Klein, MD;  Location: [facility];  Service: General;  Laterality: Left;  Left Subclavian Vein  . RE-EXCISION OF BREAST CANCER,SUPERIOR MARGINS  01/14/2013   Procedure: RE-EXCISION OF BREAST CANCER,SUPERIOR  MARGINS;  Surgeon: Stark Klein, MD;  Location: [facility];  Service: General;  Laterality: Right;  right breast re excision for markers   . TONSILLECTOMY AND ADENOIDECTOMY    . TUBAL LIGATION        Family History  Problem Relation Age of Onset  . Stomach cancer Father   . Lung cancer Maternal Uncle   . Skin cancer Maternal Grandmother   . Lung cancer Maternal Grandfather   . Lymphoma Cousin   . Colon cancer Neg Hx   . Esophageal cancer Neg Hx   . Rectal cancer Neg Hx       Social History   Substance and Sexual Activity  Drug Use No  ,   Social History   Substance and Sexual Activity  Alcohol Use Yes  . Alcohol/week: 0.0 standard drinks   Comment: occ  ,   Social History   Tobacco Use  Smoking Status Never Smoker  Smokeless Tobacco Never Used  ,   Social History   Substance and Sexual Activity  Sexual Activity Yes  . Birth control/protection: Post-menopausal, Surgical   Comment: Tubal ligation 2000    Current Outpatient Medications on File Prior to Visit  Medication Sig Dispense Refill  . carvedilol (COREG) 12.5 MG tablet Take 1 tablet (12.5 mg total) by mouth 2 (two) times daily with a meal. 180 tablet 1  . ibuprofen (ADVIL,MOTRIN) 200 MG tablet Take 400 mg by mouth every 6 (six) hours as needed. Pain    . LORazepam (ATIVAN) 0.5 MG tablet Take 1 tablet (0.5 mg total) by mouth daily as needed for anxiety (only as needed panic attacks). 60 tablet 0  . omeprazole (PRILOSEC) 20 MG capsule Take 1 capsule (20 mg total) by mouth daily. 90 capsule 1  . rosuvastatin (CRESTOR) 5 MG tablet Take 1 tablet (5 mg total) by mouth daily. b-4 bed nightly 90 tablet 1   No current facility-administered medications on file prior to visit.     Allergies: Amlodipine, Chlorhexidine, Codeine, Medralone [methylprednisolone acetate], and Oxycodone-acetaminophen  Review of Systems: General:   Denies fever, chills, unexplained weight loss.  Optho/Auditory:   Denies visual changes,  blurred vision/LOV Respiratory:   Denies SOB, DOE more than baseline levels.   Cardiovascular:   Denies chest pain, palpitations, new onset peripheral edema  Gastrointestinal:   Denies nausea, vomiting, diarrhea.  Genitourinary: Denies dysuria, freq/ urgency, flank pain or discharge from genitals.  Endocrine:     Denies hot or cold intolerance, polyuria, polydipsia. Musculoskeletal:  Denies unexplained myalgias, joint swelling, unexplained arthralgias, gait problems.  Skin:  Denies rash, suspicious lesions Neurological:     Denies dizziness, unexplained weakness, numbness  Psychiatric/Behavioral:   Denies mood changes, suicidal or homicidal ideations, hallucinations    Objective:    Blood pressure (!) 142/84, pulse 70, temperature 98.3 F (36.8 C), temperature source Oral, resp. rate 12, height 5\' 6"  (1.676 m), weight 235 lb 4.8 oz (106.7 kg), last menstrual period 06/24/2009, SpO2 97 %. Body mass index is 37.98 kg/m. General Appearance:    Alert, cooperative, no distress, appears stated age  Head:    Normocephalic, without obvious abnormality, atraumatic  Eyes:    PERRL, conjunctiva/corneas clear, EOM's intact, fundi    benign, both eyes  Ears:    Normal TM's and external ear canals, both ears  Nose:   Nares normal, septum midline, mucosa normal, no drainage    or sinus tenderness  Throat:   Lips w/o lesion, mucosa moist, and tongue normal; teeth and   gums normal  Neck:   Supple, symmetrical, trachea midline, no adenopathy;    thyroid:  no enlargement/tenderness/nodules; no carotid   bruit or JVD  Back:     Symmetric, no curvature, ROM normal, no CVA tenderness  Lungs:     Clear to auscultation bilaterally, respirations unlabored, no       Wh/ R/ R  Chest Wall:    No tenderness or gross deformity; normal excursion   Heart:    Regular rate and rhythm, S1 and S2 normal, no murmur, rub   or gallop  Breast Exam:    Deferred to OBGYN; obtains yearly due to history.  Abdomen:      Soft, non-tender, bowel sounds active all four quadrants, NO   G/R/R, no masses, no organomegaly  Genitalia:   Deferred to OBGYN.  Rectal:   Deferred to OBGYN.  Extremities:   Extremities normal, atraumatic, no cyanosis or gross edema  Pulses:   2+ and symmetric all extremities  Skin:   Warm, dry, Skin color, texture, turgor normal, no obvious rashes or lesions Psych: No HI/SI, judgement and insight good, Euthymic mood. Full Affect.  Neurologic:   CNII-XII intact, normal strength, sensation and reflexes    Throughout

## 2019-11-03 LAB — COMPREHENSIVE METABOLIC PANEL
ALT: 18 IU/L (ref 0–32)
AST: 13 IU/L (ref 0–40)
Albumin/Globulin Ratio: 2 (ref 1.2–2.2)
Albumin: 4.8 g/dL (ref 3.8–4.9)
Alkaline Phosphatase: 123 IU/L — ABNORMAL HIGH (ref 39–117)
BUN/Creatinine Ratio: 19 (ref 9–23)
BUN: 12 mg/dL (ref 6–24)
Bilirubin Total: 0.4 mg/dL (ref 0.0–1.2)
CO2: 25 mmol/L (ref 20–29)
Calcium: 9.8 mg/dL (ref 8.7–10.2)
Chloride: 101 mmol/L (ref 96–106)
Creatinine, Ser: 0.62 mg/dL (ref 0.57–1.00)
GFR calc Af Amer: 116 mL/min/{1.73_m2} (ref >59)
GFR calc non Af Amer: 100 mL/min/{1.73_m2} (ref >59)
Globulin, Total: 2.4 g/dL (ref 1.5–4.5)
Glucose: 108 mg/dL — ABNORMAL HIGH (ref 65–99)
Potassium: 4.3 mmol/L (ref 3.5–5.2)
Sodium: 141 mmol/L (ref 134–144)
Total Protein: 7.2 g/dL (ref 6.0–8.5)

## 2019-11-03 LAB — LIPID PANEL
Chol/HDL Ratio: 4 ratio (ref 0.0–4.4)
Cholesterol, Total: 156 mg/dL (ref 100–199)
HDL: 39 mg/dL — ABNORMAL LOW (ref >39)
LDL Chol Calc (NIH): 89 mg/dL (ref 0–99)
Triglycerides: 160 mg/dL — ABNORMAL HIGH (ref 0–149)
VLDL Cholesterol Cal: 28 mg/dL (ref 5–40)

## 2019-11-03 LAB — VITAMIN D 25 HYDROXY (VIT D DEFICIENCY, FRACTURES): Vit D, 25-Hydroxy: 39.2 ng/mL (ref 30.0–100.0)

## 2019-11-03 LAB — HEMOGLOBIN A1C
Est. average glucose Bld gHb Est-mCnc: 123 mg/dL
Hgb A1c MFr Bld: 5.9 % — ABNORMAL HIGH (ref 4.8–5.6)

## 2019-11-19 ENCOUNTER — Encounter: Payer: BC Managed Care – PPO | Admitting: Family Medicine

## 2019-11-30 ENCOUNTER — Other Ambulatory Visit: Payer: Self-pay | Admitting: Family Medicine

## 2019-11-30 DIAGNOSIS — I10 Essential (primary) hypertension: Secondary | ICD-10-CM

## 2019-11-30 DIAGNOSIS — K219 Gastro-esophageal reflux disease without esophagitis: Secondary | ICD-10-CM

## 2019-11-30 DIAGNOSIS — E785 Hyperlipidemia, unspecified: Secondary | ICD-10-CM

## 2020-01-11 ENCOUNTER — Other Ambulatory Visit: Payer: Self-pay | Admitting: Obstetrics and Gynecology

## 2020-01-11 DIAGNOSIS — Z1231 Encounter for screening mammogram for malignant neoplasm of breast: Secondary | ICD-10-CM

## 2020-02-19 ENCOUNTER — Ambulatory Visit: Payer: BC Managed Care – PPO | Attending: Internal Medicine

## 2020-02-19 DIAGNOSIS — Z23 Encounter for immunization: Secondary | ICD-10-CM

## 2020-02-19 NOTE — Progress Notes (Signed)
   Covid-19 Vaccination Clinic  Name:  Julia Zuniga    MRN: [id number] DOB: 30-Oct-1961  02/19/2020  Julia Zuniga was observed post Covid-19 immunization for 30 minutes based on pre-vaccination screening without incident. She was provided with Vaccine Information Sheet and instruction to access the V-Safe system.   Julia Zuniga was instructed to call 911 with any severe reactions post vaccine: Marland Kitchen Difficulty breathing  . Swelling of face and throat  . A fast heartbeat  . A bad rash all over body  . Dizziness and weakness   Immunizations Administered    Name Date Dose VIS Date Route   Pfizer COVID-19 Vaccine 02/19/2020  9:23 AM 0.3 mL 11/20/2019 Intramuscular   Manufacturer: [place]   Lot: [id number]   Rachel: [id number]

## 2020-03-02 ENCOUNTER — Telehealth: Payer: Self-pay | Admitting: Family Medicine

## 2020-03-02 NOTE — Telephone Encounter (Signed)
Patient called seeking Acute appt w/ provider for back pain for 2 weeks, advised nothing available today nor the next 2 dys  --Explained office down (1) provider & no x-ray tech--Offered providers next available in April, pt unhappy.  --placed Pt on hold fr brief minute due to multiple phone lines ringing  (unexpected appt cancellation for 3/25 occurred & Offered that to pt, she declined states since no X-ray available doesn't need to see doctor if Xray not available but will go to [facility] for treatment.)   ---FYI to med asst.  --glh

## 2020-03-03 ENCOUNTER — Encounter (HOSPITAL_COMMUNITY): Payer: Self-pay

## 2020-03-03 ENCOUNTER — Other Ambulatory Visit: Payer: Self-pay

## 2020-03-03 ENCOUNTER — Ambulatory Visit (HOSPITAL_COMMUNITY)
Admission: EM | Admit: 2020-03-03 | Discharge: 2020-03-03 | Disposition: A | Discharge status: Home / Self Care | Payer: BC Managed Care – PPO | Attending: Family Medicine | Admitting: Family Medicine

## 2020-03-03 DIAGNOSIS — T148XXA Other injury of unspecified body region, initial encounter: Secondary | ICD-10-CM

## 2020-03-03 DIAGNOSIS — M62830 Muscle spasm of back: Secondary | ICD-10-CM | POA: Diagnosis not present

## 2020-03-03 MED ORDER — CYCLOBENZAPRINE HCL 10 MG PO TABS: 10.0000 mg | ORAL_TABLET | Freq: Two times a day (BID) | ORAL | 0 refills | Status: DC | PRN

## 2020-03-03 NOTE — Discharge Instructions (Addendum)
Treating you for muscle strain and spasm.  Take the flexeril as needed, 1/2 tab to full tab  Heat, massage.  Continue the ibuprofen and tylenol as needed.  Follow up as needed for continued or worsening symptoms

## 2020-03-03 NOTE — ED Triage Notes (Signed)
Pt states it's been 2 weeks with back pain. Pt state she has muscle spasms .  Pt states the Tylenol is not working.

## 2020-03-03 NOTE — ED Provider Notes (Signed)
[place]    CSN: [id number] Arrival date & time: 03/03/20  0801      History   Chief Complaint Chief Complaint  Patient presents with  . Back Pain    HPI Julia Zuniga is a 59 y.o. female.   Patient is a 59 year old female with past medical history of anemia, anxiety, breast cancer, diverticulitis, diverticulosis, GERD, heartburn, hernia, hyperlipidemia, hypertension.  She presents today with with left lower back pain.  This is been constant, waxing waning over the past 2 days.  Describes as tightening in the back, spasming.  Some radiation of pain into the left hip area.  Denies any falls or injuries.  This all started after doing a lot of yard work and lifting of buckets and pulling weeds.  She has had similar episode in the past.  Denies any urinary symptoms, fever, numbness, tingling or weakness.  She has been alternating Tylenol and ibuprofen with some relief.  She also took some leftover hydrocodone that she had for many years back due to the pain.  She is also been using heat.  ROS per HPI      Past Medical History:  Diagnosis Date  . Anemia   . Anxiety   . Breast cancer ([place]) 12/08/12   Right  . Diverticulitis    2012  . Diverticulosis   . GERD (gastroesophageal reflux disease)   . Heartburn   . 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 History of radiation therapy 05/13/13-06/18/13   right breast, 5000 cGy 25 sessions  . Hot flashes   . Hyperlipidemia   . Hypertension   . Personal history of chemotherapy   . Personal history of radiation therapy   . PONV (postoperative nausea and vomiting)     Patient Active Problem List   Diagnosis Date Noted  . Pre-diabetes 05/20/2019  . Lateral epicondylitis of left elbow 07/15/2018  . GAD (generalized anxiety disorder) 05/22/2017  . Family history of stomach cancer- dad died age 1 12-10-2016  . 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 BMI  38.0-38.9,adult 11/14/2016  . S/P chemotherapy, time since greater than 12 weeks 11/14/2016  . Heel pain 07/05/2016  . History of Iron deficiency anemia- 2nd chemo induced Fe Def 05/10/2016  . Fatigue 08/31/2015  . GERD (gastroesophageal reflux disease) 08/11/2014  . Anxiety 05/29/2014  . Essential hypertension-  dx by Cards Jan '14 01/07/2013  . Hyperlipidemia- dx by Cards Jan '14 01/07/2013  . Malignant neoplasm of upper-outer quadrant of right breast in female, estrogen receptor negative ([place]) 12/12/2012    Past Surgical History:  Procedure Laterality Date  . BREAST BIOPSY    . BREAST LUMPECTOMY Right 12/31/2012  . BREAST LUMPECTOMY WITH SENTINEL LYMPH NODE BIOPSY  12/31/2012   Procedure: BREAST LUMPECTOMY WITH SENTINEL LYMPH NODE BX;  Surgeon: Stark Klein, MD;  Location: [facility];  Service: General;  Laterality: Right;  . CESAREAN SECTION     x 3  . CHOLECYSTECTOMY  2001   lap choli  . PORT-A-CATH REMOVAL Left 02/24/2014   Procedure: REMOVAL PORT-A-CATH;  Surgeon: Stark Klein, MD;  Location: [facility];  Service: General;  Laterality: Left;  . PORTACATH PLACEMENT  12/31/2012   Procedure: INSERTION PORT-A-CATH;  Surgeon: Stark Klein, MD;  Location: [facility];  Service: General;  Laterality: Left;  Left Subclavian Vein  . RE-EXCISION OF BREAST CANCER,SUPERIOR MARGINS  01/14/2013   Procedure: RE-EXCISION OF  BREAST CANCER,SUPERIOR MARGINS;  Surgeon: Stark Klein, MD;  Location: [facility];  Service: General;  Laterality: Right;  right breast re excision for markers   . TONSILLECTOMY AND ADENOIDECTOMY    . TUBAL LIGATION      OB History   No obstetric history on file.      Home Medications    Prior to Admission medications   Medication Sig Start Date End Date Taking? Authorizing Provider  carvedilol (COREG) 12.5 MG tablet TAKE 1 TABLET BY MOUTH 2 TIMES DAILY WITH A MEAL. 12/01/19   Opalski, Neoma Laming, DO  cyclobenzaprine (FLEXERIL) 10  MG tablet Take 1 tablet (10 mg total) by mouth 2 (two) times daily as needed for muscle spasms. 03/03/20   Loura Halt A, NP  ibuprofen (ADVIL,MOTRIN) 200 MG tablet Take 400 mg by mouth every 6 (six) hours as needed. Pain    [provider]  LORazepam (ATIVAN) 0.5 MG tablet Take 1 tablet (0.5 mg total) by mouth daily as needed for anxiety (only as needed panic attacks). 05/20/19   Opalski, Deborah, DO  omeprazole (PRILOSEC) 20 MG capsule TAKE 1 CAPSULE BY MOUTH DAILY. 12/01/19   Opalski, Neoma Laming, DO  rosuvastatin (CRESTOR) 5 MG tablet TAKE 1 TABLET BY MOUTH NIGHTLY BEFORE BED. 12/01/19   Mellody Dance, DO    Family History Family History  Problem Relation Age of Onset  . Stomach cancer Father   . Lung cancer Maternal Uncle   . Skin cancer Maternal Grandmother   . Lung cancer Maternal Grandfather   . Lymphoma Cousin   . Colon cancer Neg Hx   . Esophageal cancer Neg Hx   . Rectal cancer Neg Hx     Social History Social History   Tobacco Use  . Smoking status: Never Smoker  . Smokeless tobacco: Never Used  Substance Use Topics  . Alcohol use: Yes    Alcohol/week: 0.0 standard drinks    Comment: occ  . Drug use: No     Allergies   Amlodipine, Chlorhexidine, Codeine, Medralone [methylprednisolone acetate], and Oxycodone-acetaminophen   Review of Systems Review of Systems   Physical Exam Triage Vital Signs ED Triage Vitals  Enc Vitals Group     BP 03/03/20 0815 (!) 143/84     Pulse Rate 03/03/20 0815 86     Resp 03/03/20 0815 16     Temp 03/03/20 0815 98.4 F (36.9 C)     Temp Source 03/03/20 0815 Oral     SpO2 03/03/20 0815 98 %     Weight 03/03/20 0818 235 lb (106.6 kg)     Height --      Head Circumference --      Peak Flow --      Pain Score 03/03/20 0818 6     Pain Loc --      Pain Edu? --      Excl. in [place]? --    No data found.  Updated Vital Signs BP (!) 143/84 (BP Location: Left Arm)   Pulse 86   Temp 98.4 F (36.9 C) (Oral)   Resp 16    Wt 235 lb (106.6 kg)   LMP 06/24/2009   SpO2 98%   BMI 37.93 kg/m   Visual Acuity Right Eye Distance:   Left Eye Distance:   Bilateral Distance:    Right Eye Near:   Left Eye Near:    Bilateral Near:     Physical Exam Vitals and nursing note reviewed.  Constitutional:  General: She is not in acute distress.    Appearance: Normal appearance. She is not ill-appearing, toxic-appearing or diaphoretic.  HENT:     Head: Normocephalic.     Nose: Nose normal.  Eyes:     Conjunctiva/sclera: Conjunctivae normal.  Pulmonary:     Effort: Pulmonary effort is normal.  Musculoskeletal:        General: Normal range of motion.     Cervical back: Normal range of motion.     Lumbar back: Tenderness present.       Back:     Comments: TTP A few burns from heating pad. No rash.  No spinal tenderness.   Skin:    General: Skin is warm and dry.     Findings: No rash.  Neurological:     Mental Status: She is alert.  Psychiatric:        Mood and Affect: Mood normal.      UC Treatments / Results  Labs (all labs ordered are listed, but only abnormal results are displayed) Labs Reviewed - No data to display  EKG   Radiology No results found.  Procedures Procedures (including critical care time)  Medications Ordered in UC Medications - No data to display  Initial Impression / Assessment and Plan / UC Course  I have reviewed the triage vital signs and the nursing notes.  Pertinent labs & imaging results that were available during my care of the patient were reviewed by me and considered in my medical decision making (see chart for details).     Muscle strain and spasm. Treating with Flexeril, half tab to full tab as needed. Gentle stretching, heat and massage. Tylenol ibuprofen as needed Follow up as needed for continued or worsening symptoms  Final Clinical Impressions(s) / UC Diagnoses   Final diagnoses:  Muscle spasm of back  Muscle strain     Discharge  Instructions     Treating you for muscle strain and spasm.  Take the flexeril as needed, 1/2 tab to full tab  Heat, massage.  Continue the ibuprofen and tylenol as needed.  Follow up as needed for continued or worsening symptoms     ED Prescriptions    Medication Sig Dispense Auth. Provider   cyclobenzaprine (FLEXERIL) 10 MG tablet Take 1 tablet (10 mg total) by mouth 2 (two) times daily as needed for muscle spasms. 20 tablet Loura Halt A, NP     PDMP not reviewed this encounter.   Loura Halt A, NP 03/03/20 [phone]

## 2020-03-08 ENCOUNTER — Ambulatory Visit: Payer: BC Managed Care – PPO

## 2020-03-15 ENCOUNTER — Ambulatory Visit: Payer: BC Managed Care – PPO | Attending: Internal Medicine

## 2020-03-15 DIAGNOSIS — Z23 Encounter for immunization: Secondary | ICD-10-CM

## 2020-03-15 NOTE — Progress Notes (Signed)
   Covid-19 Vaccination Clinic  Name:  Julia Zuniga    MRN: [id number] DOB: 1961/05/09  03/15/2020  Ms. Sangha was observed post Covid-19 immunization for 15 minutes without incident. She was provided with Vaccine Information Sheet and instruction to access the V-Safe system.   Ms. Munafo was instructed to call 911 with any severe reactions post vaccine: Marland Kitchen Difficulty breathing  . Swelling of face and throat  . A fast heartbeat  . A bad rash all over body  . Dizziness and weakness   Immunizations Administered    Name Date Dose VIS Date Route   Pfizer COVID-19 Vaccine 03/15/2020  8:11 AM 0.3 mL 11/20/2019 Intramuscular   Manufacturer: [place]   Lot: [id number]   [place]: [id number]

## 2020-05-03 NOTE — Progress Notes (Signed)
Established Patient Office Visit  Subjective:  Patient ID: Julia Zuniga, female    DOB: 1961/09/15  Age: 59 y.o. MRN: [id number]  CC: No chief complaint on file.   HPI Julia Zuniga presents for 6 month chronic follow-up on hypertension, hyperlipidemia, and pre-diabetes.  HTN: Pt denies chest pain, palpitations, dizziness or lower extremity swelling. Taking medication as directed without side effects. Checks BP at home 2 times/wk and readings range in 115-127/70-84.   HLD: Pt taking medication as directed without issues. Denies side effects including myalgias and RUQ pain. Now that it is getting warmer she plans to do aquatic exercise at her pool.   Pre-diabetes: Denies increased thirst or urination. Pt reports she tries to watch her carbohydrates and doesn't really eat sweets or drink sodas or sweet tea. She mostly drinks water and coffee.     Past Medical History:  Diagnosis Date  . Anemia   . Anxiety   . Breast cancer ([place]) 12/08/12   Right  . Diverticulitis    2012  . Diverticulosis   . GERD (gastroesophageal reflux disease)   . Heartburn   . 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 History of radiation therapy 05/13/13-06/18/13   right breast, 5000 cGy 25 sessions  . Hot flashes   . Hyperlipidemia   . Hypertension   . Personal history of chemotherapy   . Personal history of radiation therapy   . PONV (postoperative nausea and vomiting)     Past Surgical History:  Procedure Laterality Date  . BREAST BIOPSY    . BREAST LUMPECTOMY Right 12/31/2012  . BREAST LUMPECTOMY WITH SENTINEL LYMPH NODE BIOPSY  12/31/2012   Procedure: BREAST LUMPECTOMY WITH SENTINEL LYMPH NODE BX;  Surgeon: Stark Klein, MD;  Location: [facility];  Service: General;  Laterality: Right;  . CESAREAN SECTION     x 3  . CHOLECYSTECTOMY  2001   lap choli  . PORT-A-CATH REMOVAL Left 02/24/2014   Procedure: REMOVAL PORT-A-CATH;  Surgeon: Stark Klein, MD;   Location: [place];  Service: General;  Laterality: Left;  . PORTACATH PLACEMENT  12/31/2012   Procedure: INSERTION PORT-A-CATH;  Surgeon: Stark Klein, MD;  Location: [facility];  Service: General;  Laterality: Left;  Left Subclavian Vein  . RE-EXCISION OF BREAST CANCER,SUPERIOR MARGINS  01/14/2013   Procedure: RE-EXCISION OF BREAST CANCER,SUPERIOR MARGINS;  Surgeon: Stark Klein, MD;  [facility];  Service: General;  Laterality: Right;  right breast re excision for markers   . TONSILLECTOMY AND ADENOIDECTOMY    . TUBAL LIGATION      Family History  Problem Relation Age of Onset  . Stomach cancer Father   . Lung cancer Maternal Uncle   . Skin cancer Maternal Grandmother   . Lung cancer Maternal Grandfather   . Lymphoma Cousin   . Colon cancer Neg Hx   . Esophageal cancer Neg Hx   . Rectal cancer Neg Hx     Social History   Socioeconomic History  . Marital status: Married    Spouse name: Not on file  . Number of children: Not on file  . Years of education: Not on file  . Highest education level: Not on file  Occupational History  . Not on file  Tobacco Use  . Smoking status: Never Smoker  . Smokeless tobacco: Never Used  Substance and Sexual Activity  . Alcohol use: Yes    Alcohol/week: 0.0 standard  drinks    Comment: occ  . Drug use: No  . Sexual activity: Yes    Birth control/protection: Post-menopausal, Surgical    Comment: Tubal ligation 2000  Other Topics Concern  . Not on file  Social History Narrative   Lives with husband and daughter in a 2 story home.  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     Does not work.  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    Education: college   Social Determinants of Radio broadcast assistant Strain:   . Difficulty of Paying Living Expenses:   Food Insecurity:   . Worried About Charity fundraiser in the Last Year:   . Arboriculturist in the Last Year:   Transportation  Needs:   . Film/video editor (Medical):   Marland Kitchen Lack of Transportation (Non-Medical):   Physical Activity:   . Days of Exercise per Week:   . Minutes of Exercise per Session:   Stress:   . Feeling of Stress :   Social Connections:   . Frequency of Communication with Friends and Family:   . Frequency of Social Gatherings with Friends and Family:   . Attends Religious Services:   . Active Member of Clubs or Organizations:   . Attends Archivist Meetings:   Marland Kitchen Marital Status:   Intimate Partner Violence:   . Fear of Current or Ex-Partner:   . Emotionally Abused:   Marland Kitchen Physically Abused:   . Sexually Abused:     Outpatient Medications Prior to Visit  Medication Sig Dispense Refill  . carvedilol (COREG) 12.5 MG tablet TAKE 1 TABLET BY MOUTH 2 TIMES DAILY WITH A MEAL. 180 tablet 1  . cyclobenzaprine (FLEXERIL) 10 MG tablet Take 1 tablet (10 mg total) by mouth 2 (two) times daily as needed for muscle spasms. 20 tablet 0  . ibuprofen (ADVIL,MOTRIN) 200 MG tablet Take 400 mg by mouth every 6 (six) hours as needed. Pain    . LORazepam (ATIVAN) 0.5 MG tablet Take 1 tablet (0.5 mg total) by mouth daily as needed for anxiety (only as needed panic attacks). 60 tablet 0  . omeprazole (PRILOSEC) 20 MG capsule TAKE 1 CAPSULE BY MOUTH DAILY. 90 capsule 1  . rosuvastatin (CRESTOR) 5 MG tablet TAKE 1 TABLET BY MOUTH NIGHTLY BEFORE BED. 90 tablet 1   No facility-administered medications prior to visit.    Allergies  Allergen Reactions  . Amlodipine     Dizziness, lightheaded, breathing issues   . Chlorhexidine     Patient skin gets bright red with rash.      . Codeine Swelling  . Medralone [Methylprednisolone Acetate]     "coming out of skin"   . Oxycodone-Acetaminophen Other (See Comments)    Face got red and hot    ROS Review of Systems  A fourteen system review of systems was performed and found to be positive as per HPI.   Objective:    Physical Exam General:  Well  Developed, well nourished, appropriate for stated age.  Neuro:  Alert and oriented,  extra-ocular muscles intact  HEENT:  Normocephalic, atraumatic, neck supple, no carotid bruits appreciated  Skin:  no gross rash, warm, pink. Cardiac:  RRR, S1 S2 Respiratory:  ECTA B/L and A/P, Not using accessory muscles, speaking in full sentences- unlabored. Vascular:  Ext warm, no cyanosis apprec.; cap RF less 2 sec. Psych:  No HI/SI, judgement and insight good,  Euthymic mood. Full Affect.    LMP 06/24/2009  Wt Readings from Last 3 Encounters:  03/03/20 235 lb (106.6 kg)  11/02/19 235 lb 4.8 oz (106.7 kg)  05/20/19 235 lb 9.6 oz (106.9 kg)     Health Maintenance Due  Topic Date Due  . Hepatitis C Screening  Never done  . PAP SMEAR-Modifier  02/20/2020    There are no preventive care reminders to display for this patient.  Lab Results  Component Value Date   TSH 1.38 11/14/2016   Lab Results  Component Value Date   WBC 10.8 11/17/2018   HGB 13.0 11/17/2018   HCT 40.7 11/17/2018   MCV 81 11/17/2018   PLT 266 11/17/2018   Lab Results  Component Value Date   NA 141 11/02/2019   K 4.3 11/02/2019   CHLORIDE 107 06/04/2017   CO2 25 11/02/2019   GLUCOSE 108 (H) 11/02/2019   BUN 12 11/02/2019   CREATININE 0.62 11/02/2019   BILITOT 0.4 11/02/2019   ALKPHOS 123 (H) 11/02/2019   AST 13 11/02/2019   ALT 18 11/02/2019   PROT 7.2 11/02/2019   ALBUMIN 4.8 11/02/2019   CALCIUM 9.8 11/02/2019   ANIONGAP 11 09/10/2018   EGFR >90 06/04/2017   Lab Results  Component Value Date   CHOL 156 11/02/2019   Lab Results  Component Value Date   HDL 39 (L) 11/02/2019   Lab Results  Component Value Date   LDLCALC 89 11/02/2019   Lab Results  Component Value Date   TRIG 160 (H) 11/02/2019   Lab Results  Component Value Date   CHOLHDL 4.0 11/02/2019   Lab Results  Component Value Date   HGBA1C 5.9 (H) 11/02/2019      Assessment & Plan:   Problem List Items Addressed This Visit       Cardiovascular and Mediastinum   Essential hypertension-  dx by Cards Jan '14 - Primary (Chronic)     Other   Hyperlipidemia- dx by Cards Jan '14 (Chronic)   BMI 38.0-38.9,adult (Chronic)   Pre-diabetes     HTN: - BP today is 117/78, at goal. - Continue Carvedilol. Refills provided. - Continue ambulatory BP and pulse monitoring. - Encourage DASH diet. - Encourage to stay as active as possible.   HLD: - Last lipid panel: triglycerides elevated 160 and HDL decreased. - Continue Crestor. Refills provided. - Continue heart healthy diet and stay as active as possible.  - Plan to recheck lipid panel and hepatic function at next OV (CPE)  Pre-diabetes: - Last A1c 5.9 - Encourage to continue low carbohydrate/glucose diet - Plan to recheck A1c at next OV (CPE)     No orders of the defined types were placed in this encounter.   Follow-up: No follow-ups on file.    Lorrene Reid, PA-C

## 2020-05-04 ENCOUNTER — Other Ambulatory Visit: Payer: Self-pay

## 2020-05-04 ENCOUNTER — Encounter: Payer: Self-pay | Admitting: Physician Assistant

## 2020-05-04 ENCOUNTER — Ambulatory Visit (INDEPENDENT_AMBULATORY_CARE_PROVIDER_SITE_OTHER): Payer: BC Managed Care – PPO | Admitting: Physician Assistant

## 2020-05-04 VITALS — BP 117/78 | HR 76 | Temp 98.6°F | Ht 66.0 in | Wt 241.6 lb

## 2020-05-04 DIAGNOSIS — R7303 Prediabetes: Secondary | ICD-10-CM

## 2020-05-04 DIAGNOSIS — I10 Essential (primary) hypertension: Secondary | ICD-10-CM | POA: Diagnosis not present

## 2020-05-04 DIAGNOSIS — E785 Hyperlipidemia, unspecified: Secondary | ICD-10-CM | POA: Diagnosis not present

## 2020-05-04 MED ORDER — CARVEDILOL 12.5 MG PO TABS: ORAL_TABLET | ORAL | 2 refills | Status: DC

## 2020-05-04 MED ORDER — ROSUVASTATIN CALCIUM 5 MG PO TABS: ORAL_TABLET | ORAL | 2 refills | Status: DC

## 2020-05-04 NOTE — Patient Instructions (Signed)

## 2020-05-05 ENCOUNTER — Ambulatory Visit
Admission: RE | Admit: 2020-05-05 | Discharge: 2020-05-05 | Disposition: A | Discharge status: Home / Self Care | Payer: BC Managed Care – PPO | Source: Ambulatory Visit | Attending: Obstetrics and Gynecology | Admitting: Obstetrics and Gynecology

## 2020-05-05 DIAGNOSIS — Z1231 Encounter for screening mammogram for malignant neoplasm of breast: Secondary | ICD-10-CM | POA: Diagnosis not present

## 2020-05-10 ENCOUNTER — Other Ambulatory Visit: Payer: Self-pay | Admitting: Physician Assistant

## 2020-05-10 ENCOUNTER — Encounter: Payer: Self-pay | Admitting: Physician Assistant

## 2020-05-10 DIAGNOSIS — K219 Gastro-esophageal reflux disease without esophagitis: Secondary | ICD-10-CM

## 2020-05-10 MED ORDER — OMEPRAZOLE 20 MG PO CPDR: 20.0000 mg | DELAYED_RELEASE_CAPSULE | Freq: Every day | ORAL | 0 refills | Status: DC

## 2020-05-25 ENCOUNTER — Ambulatory Visit
Admission: EM | Admit: 2020-05-25 | Discharge: 2020-05-25 | Disposition: A | Discharge status: Home / Self Care | Payer: BC Managed Care – PPO | Attending: Physician Assistant | Admitting: Physician Assistant

## 2020-05-25 DIAGNOSIS — H0102A Squamous blepharitis right eye, upper and lower eyelids: Secondary | ICD-10-CM | POA: Diagnosis not present

## 2020-05-25 DIAGNOSIS — H0102B Squamous blepharitis left eye, upper and lower eyelids: Secondary | ICD-10-CM

## 2020-05-25 MED ORDER — ERYTHROMYCIN 5 MG/GM OP OINT
TOPICAL_OINTMENT | OPHTHALMIC | 0 refills | Status: DC
Start: 2020-05-25 — End: 2021-01-09

## 2020-05-25 NOTE — ED Triage Notes (Signed)
Pt c/o bilateral eye burning, redness, and drainage x5 days.

## 2020-05-25 NOTE — Discharge Instructions (Signed)
Erythromycin ointment as directed. Artificial tear gel (systane/genteal), tears as needed. Lid scrubs and warm compresses as directed. Monitor for any worsening of symptoms, changes in vision, sensitivity to light, eye swelling, painful eye movement, follow up with ophthalmology for further evaluation.

## 2020-05-25 NOTE — ED Provider Notes (Signed)
[facility]    CSN: [id number] Arrival date & time: 05/25/20  0931      History   Chief Complaint Chief Complaint  Patient presents with  . Eye Drainage    HPI Julia Zuniga is a 59 y.o. female.   59 year old female comes in with 5 day history of bilateral eye redness, eye burning. States started out with itching, foreign body sensation. Now with eye drainage an crusting in the morning. Blurry vision. Denies photophobia. Denies URI symptoms, fever. Pataday without relief.      Past Medical History:  Diagnosis Date  . Anemia   . Anxiety   . Breast cancer ([place]) 12/08/12   Right  . Diverticulitis    2012  . Diverticulosis   . GERD (gastroesophageal reflux disease)   . Heartburn   . 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 History of radiation therapy 05/13/13-06/18/13   right breast, 5000 cGy 25 sessions  . Hot flashes   . Hyperlipidemia   . Hypertension   . Personal history of chemotherapy   . Personal history of radiation therapy   . PONV (postoperative nausea and vomiting)     Patient Active Problem List   Diagnosis Date Noted  . Pre-diabetes 05/20/2019  . Lateral epicondylitis of left elbow 07/15/2018  . GAD (generalized anxiety disorder) 05/22/2017  . Family history of stomach cancer- dad died age 39 12-10-16  . 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 BMI 38.0-38.9,adult 11/14/2016  . S/P chemotherapy, time since greater than 12 weeks 11/14/2016  . Heel pain 07/05/2016  . History of Iron deficiency anemia- 2nd chemo induced Fe Def 05/10/2016  . Fatigue 08/31/2015  . GERD (gastroesophageal reflux disease) 08/11/2014  . Anxiety 05/29/2014  . Essential hypertension-  dx by Cards Jan '14 01/07/2013  . Hyperlipidemia- dx by Cards Jan '14 01/07/2013  . Malignant neoplasm of upper-outer quadrant of right breast in female, estrogen receptor negative ([place]) 12/12/2012    Past Surgical  History:  Procedure Laterality Date  . BREAST BIOPSY    . BREAST LUMPECTOMY Right 12/31/2012  . BREAST LUMPECTOMY WITH SENTINEL LYMPH NODE BIOPSY  12/31/2012   Procedure: BREAST LUMPECTOMY WITH SENTINEL LYMPH NODE BX;  Surgeon: Stark Klein, MD;  Location: [place];  Service: General;  Laterality: Right;  . CESAREAN SECTION     x 3  . CHOLECYSTECTOMY  2001   lap choli  . PORT-A-CATH REMOVAL Left 02/24/2014   Procedure: REMOVAL PORT-A-CATH;  Surgeon: Stark Klein, MD;  Location: [place];  Service: General;  Laterality: Left;  . PORTACATH PLACEMENT  12/31/2012   Procedure: INSERTION PORT-A-CATH;  Surgeon: Stark Klein, MD;  Location: [place];  Service: General;  Laterality: Left;  Left Subclavian Vein  . RE-EXCISION OF BREAST CANCER,SUPERIOR MARGINS  01/14/2013   Procedure: RE-EXCISION OF BREAST CANCER,SUPERIOR MARGINS;  Surgeon: Stark Klein, MD;  Location: [facility];  Service: General;  Laterality: Right;  right breast re excision for markers   . TONSILLECTOMY AND ADENOIDECTOMY    . TUBAL LIGATION      OB History   No obstetric history on file.      Home Medications    Prior to Admission medications   Medication Sig Start Date End Date Taking? Authorizing Provider  carvedilol (COREG) 12.5 MG tablet TAKE 1 TABLET BY MOUTH 2 TIMES DAILY WITH A MEAL. 05/04/20   Lorrene Reid, PA-C  erythromycin ophthalmic ointment Place a 1/2 inch ribbon of ointment into the eye 4 times a day for 7 days 05/25/20   Ok Edwards, PA-C  ibuprofen (ADVIL,MOTRIN) 200 MG tablet Take 400 mg by mouth every 6 (six) hours as needed. Pain    [provider]  LORazepam (ATIVAN) 0.5 MG tablet Take 1 tablet (0.5 mg total) by mouth daily as needed for anxiety (only as needed panic attacks). 05/20/19   Opalski, Neoma Laming, DO  omeprazole (PRILOSEC) 20 MG capsule Take 1 capsule (20 mg total) by mouth daily. 05/10/20   Lorrene Reid, PA-C  rosuvastatin (CRESTOR) 5 MG  tablet TAKE 1 TABLET BY MOUTH NIGHTLY BEFORE BED. 05/04/20   Lorrene Reid, PA-C    Family History Family History  Problem Relation Age of Onset  . Stomach cancer Father   . Lung cancer Maternal Uncle   . Skin cancer Maternal Grandmother   . Lung cancer Maternal Grandfather   . Lymphoma Cousin   . Colon cancer Neg Hx   . Esophageal cancer Neg Hx   . Rectal cancer Neg Hx     Social History Social History   Tobacco Use  . Smoking status: Never Smoker  . Smokeless tobacco: Never Used  Substance Use Topics  . Alcohol use: Yes    Alcohol/week: 0.0 standard drinks    Comment: occ  . Drug use: No     Allergies   Amlodipine, Chlorhexidine, Codeine, Medralone [methylprednisolone acetate], and Oxycodone-acetaminophen   Review of Systems Review of Systems  Reason unable to perform ROS: See HPI as above.     Physical Exam Triage Vital Signs ED Triage Vitals  Enc Vitals Group     BP 05/25/20 0938 (!) 149/95     Pulse Rate 05/25/20 0938 90     Resp 05/25/20 0938 18     Temp 05/25/20 0938 98.5 F (36.9 C)     Temp Source 05/25/20 0938 Oral     SpO2 05/25/20 0938 94 %     Weight --      Height --      Head Circumference --      Peak Flow --      Pain Score 05/25/20 0945 6     Pain Loc --      Pain Edu? --      Excl. in [place]? --    No data found.  Updated Vital Signs BP (!) 149/95 (BP Location: Left Arm)   Pulse 90   Temp 98.5 F (36.9 C) (Oral)   Resp 18   LMP 06/24/2009   SpO2 94%   Visual Acuity Right Eye Distance: 20/200 Left Eye Distance: 20/200 Bilateral Distance: 20/200  Right Eye Near:   Left Eye Near:    Bilateral Near:     Physical Exam Constitutional:      General: She is not in acute distress.    Appearance: She is well-developed. She is not ill-appearing, toxic-appearing or diaphoretic.  HENT:     Head: Normocephalic and atraumatic.  Eyes:     General: Lids are normal. Lids are everted, no foreign bodies appreciated.     Pupils:  Pupils are equal, round, and reactive to light.     Comments: Conjunctival injection to 12 o'clock region. EOMI   Neurological:     Mental Status: She is alert and oriented to person, place, and time.      UC Treatments / Results  Labs (all labs ordered are listed, but only abnormal results  are displayed) Labs Reviewed - No data to display  EKG   Radiology No results found.  Procedures Procedures (including critical care time)  Medications Ordered in UC Medications - No data to display  Initial Impression / Assessment and Plan / UC Course  I have reviewed the triage vital signs and the nursing notes.  Pertinent labs & imaging results that were available during my care of the patient were reviewed by me and considered in my medical decision making (see chart for details).    Discussed history and exam most consistent with blepharitis. However, given now with crusting, some conjunctival injection, will cover for bacterial conjunctivitis with erythromycin. Lid scrubs and warm compresses as directed. Patient to follow up with ophthalmology if symptoms worsens or does not improve. Return precautions given.    Final Clinical Impressions(s) / UC Diagnoses   Final diagnoses:  Squamous blepharitis of upper and lower eyelids of both eyes   ED Prescriptions    Medication Sig Dispense Auth. Provider   erythromycin ophthalmic ointment Place a 1/2 inch ribbon of ointment into the eye 4 times a day for 7 days 1 g Ok Edwards, PA-C     PDMP not reviewed this encounter.   Ok Edwards, PA-C 05/25/20 1019

## 2020-05-27 ENCOUNTER — Other Ambulatory Visit: Payer: Self-pay | Admitting: Family Medicine

## 2020-05-27 DIAGNOSIS — K219 Gastro-esophageal reflux disease without esophagitis: Secondary | ICD-10-CM

## 2020-05-28 ENCOUNTER — Telehealth: Payer: Self-pay | Admitting: Emergency Medicine

## 2020-05-28 MED ORDER — ERYTHROMYCIN 5 MG/GM OP OINT
TOPICAL_OINTMENT | OPHTHALMIC | 0 refills | Status: DC
Start: 2020-05-28 — End: 2021-01-09

## 2020-05-28 NOTE — Telephone Encounter (Signed)
Patient called stating she needs refill of erythromycin ointment as she does not have enough dispensed to complete prescribed therapy.  Unable to reach patient: Left message stating refill was sent to preferred pharmacy.  Discussed return precautions and follow-up with ophthalmology.  Invited patient to call back with any questions/concerns.

## 2020-05-30 DIAGNOSIS — H04123 Dry eye syndrome of bilateral lacrimal glands: Secondary | ICD-10-CM | POA: Diagnosis not present

## 2020-06-06 DIAGNOSIS — H04123 Dry eye syndrome of bilateral lacrimal glands: Secondary | ICD-10-CM | POA: Diagnosis not present

## 2020-08-08 ENCOUNTER — Other Ambulatory Visit: Payer: Self-pay | Admitting: Physician Assistant

## 2020-08-08 DIAGNOSIS — K219 Gastro-esophageal reflux disease without esophagitis: Secondary | ICD-10-CM

## 2020-10-19 ENCOUNTER — Telehealth: Payer: Self-pay

## 2020-10-19 ENCOUNTER — Telehealth: Payer: Self-pay | Admitting: Physician Assistant

## 2020-10-19 DIAGNOSIS — I10 Essential (primary) hypertension: Secondary | ICD-10-CM

## 2020-10-19 DIAGNOSIS — K219 Gastro-esophageal reflux disease without esophagitis: Secondary | ICD-10-CM

## 2020-10-19 DIAGNOSIS — E785 Hyperlipidemia, unspecified: Secondary | ICD-10-CM

## 2020-10-19 MED ORDER — ROSUVASTATIN CALCIUM 5 MG PO TABS: ORAL_TABLET | ORAL | 0 refills | Status: DC

## 2020-10-19 MED ORDER — CARVEDILOL 12.5 MG PO TABS: ORAL_TABLET | ORAL | 0 refills | Status: DC

## 2020-10-19 MED ORDER — PANTOPRAZOLE SODIUM 20 MG PO TBEC: 20.0000 mg | DELAYED_RELEASE_TABLET | Freq: Every day | ORAL | 0 refills | Status: DC

## 2020-10-19 NOTE — Telephone Encounter (Signed)
Documentation for Julia Zuniga, CMA:  Pt was called to r/s her appt for 11/19/2021since [name] would be in a meeting.  Unable to r/s pt due to her scheduling conflicts until 80/02/4916.  Pt states that she needs 90 day refills on carvedilol and crestor.  She also states that her omeprazole is no longer covered by her insurance company and requests alternative therapy.  Please review request.  [name], CMA

## 2020-10-19 NOTE — Telephone Encounter (Signed)
Sent rx for pantoprazole 20 mg.  Lorrene Reid, PA-C

## 2020-10-19 NOTE — Telephone Encounter (Signed)
Sent in rx for pantoprazole 20 mg once daily.  Thank you, [name]

## 2020-10-19 NOTE — Telephone Encounter (Signed)
LVM informing pt of RX for new medication.  [name], CMA

## 2020-10-20 ENCOUNTER — Other Ambulatory Visit: Payer: Self-pay

## 2020-10-20 MED ORDER — OMEPRAZOLE 20 MG PO CPDR
20.0000 mg | DELAYED_RELEASE_CAPSULE | Freq: Two times a day (BID) | ORAL | 3 refills | Status: DC
Start: 2020-10-20 — End: 2021-01-09

## 2020-10-20 NOTE — Telephone Encounter (Signed)
Pt came into the office stating that her insurance will not cover pantoprazole either.  Pt request new RX for omeprazole since it is cheaper that way.  RX sent to [facility] per pt request.  [name], CMA

## 2020-10-21 ENCOUNTER — Telehealth: Payer: Self-pay | Admitting: Physician Assistant

## 2020-10-21 NOTE — Telephone Encounter (Signed)
Patient requested we change her Omeprazole to Pantoprazole due to insurance coverage. This change has been made but Pantoprazole is not covered by [employer].   I have called patient and left a message for her to call the insurance company and verify what medication is preferred and covered and to call our office back with this information. AS, CMA

## 2020-10-28 ENCOUNTER — Encounter: Payer: BC Managed Care – PPO | Admitting: Physician Assistant

## 2020-12-06 DIAGNOSIS — Z20822 Contact with and (suspected) exposure to covid-19: Secondary | ICD-10-CM | POA: Diagnosis not present

## 2021-01-09 ENCOUNTER — Other Ambulatory Visit: Payer: Self-pay

## 2021-01-09 ENCOUNTER — Ambulatory Visit (INDEPENDENT_AMBULATORY_CARE_PROVIDER_SITE_OTHER): Payer: BC Managed Care – PPO | Admitting: Physician Assistant

## 2021-01-09 ENCOUNTER — Encounter: Payer: BC Managed Care – PPO | Admitting: Physician Assistant

## 2021-01-09 ENCOUNTER — Encounter: Payer: Self-pay | Admitting: Physician Assistant

## 2021-01-09 ENCOUNTER — Ambulatory Visit: Payer: BC Managed Care – PPO | Admitting: Physician Assistant

## 2021-01-09 VITALS — BP 124/80 | HR 79 | Ht 66.0 in | Wt 251.6 lb

## 2021-01-09 DIAGNOSIS — F419 Anxiety disorder, unspecified: Secondary | ICD-10-CM | POA: Diagnosis not present

## 2021-01-09 DIAGNOSIS — Z0000 Encounter for general adult medical examination without abnormal findings: Secondary | ICD-10-CM | POA: Diagnosis not present

## 2021-01-09 DIAGNOSIS — I10 Essential (primary) hypertension: Secondary | ICD-10-CM

## 2021-01-09 DIAGNOSIS — E785 Hyperlipidemia, unspecified: Secondary | ICD-10-CM

## 2021-01-09 DIAGNOSIS — H6592 Unspecified nonsuppurative otitis media, left ear: Secondary | ICD-10-CM

## 2021-01-09 DIAGNOSIS — C50411 Malignant neoplasm of upper-outer quadrant of right female breast: Secondary | ICD-10-CM

## 2021-01-09 DIAGNOSIS — K2100 Gastro-esophageal reflux disease with esophagitis, without bleeding: Secondary | ICD-10-CM

## 2021-01-09 DIAGNOSIS — Z171 Estrogen receptor negative status [ER-]: Secondary | ICD-10-CM

## 2021-01-09 MED ORDER — LORAZEPAM 0.5 MG PO TABS: 0.5000 mg | ORAL_TABLET | Freq: Every day | ORAL | 0 refills | Status: DC | PRN

## 2021-01-09 MED ORDER — CARVEDILOL 12.5 MG PO TABS: ORAL_TABLET | ORAL | 1 refills | Status: DC

## 2021-01-09 MED ORDER — OMEPRAZOLE 20 MG PO CPDR: 20.0000 mg | DELAYED_RELEASE_CAPSULE | Freq: Two times a day (BID) | ORAL | 1 refills | Status: DC

## 2021-01-09 MED ORDER — ROSUVASTATIN CALCIUM 5 MG PO TABS: ORAL_TABLET | ORAL | 1 refills | Status: DC

## 2021-01-09 NOTE — Progress Notes (Signed)
Female Physical   Impression and Recommendations:    1. Healthcare maintenance   2. Essential hypertension-  dx by Cards Jan '14   3. Hyperlipidemia, unspecified hyperlipidemia type   4. Anxiety   5. Gastroesophageal reflux disease with esophagitis, unspecified whether hemorrhage      1) Anticipatory Guidance: Skin CA prevention- recommend to use sunscreen when outside along with skin surveillance; eating a balanced and modest diet; physical activity at least 25 minutes per day or minimum of 150 min/ week moderate to intense activity.  2) Immunizations / Screenings / Labs:   All immunizations are up-to-date per recommendations or will be updated today if pt allows.    - Patient understands with dental and vision screens they will schedule independently.  - Will obtain CBC, CMP, HgA1c, Lipid panel, and TSH when fasting. - UTD on mammogram, Tdap, Influenza. Has completed Covid vaccine series including booster. Plans to contact OB-GYN for female exam. - Declined Hep C and HIV screenings  3) Weight: Recommend to continue to improve diet habits to improve overall feelings of well being and objective health data. Improve nutrient density of diet through increasing intake of fruits and vegetables and decreasing saturated fats, white flour products and refined sugars.  4) Healthcare Maintenance: -Continue current medication regimen. Provided refills. Continue ambulatory BP/pulse monitoring. -Continue to stay hydrated. -Follow a heart healthy diet. -Resume physical activity. -Follow up in 6 months for HTN, HLD, GERD   Meds ordered this encounter  Medications  . carvedilol (COREG) 12.5 MG tablet    Sig: TAKE 1 TABLET BY MOUTH 2 TIMES DAILY WITH A MEAL.    Dispense:  180 tablet    Refill:  1    Order Specific Question:   Supervising Provider    Answer:   Beatrice Lecher D [[id number]]  . rosuvastatin (CRESTOR) 5 MG tablet    Sig: TAKE 1 TABLET BY MOUTH NIGHTLY BEFORE BED.    Dispense:   90 tablet    Refill:  1    Order Specific Question:   Supervising Provider    Answer:   Beatrice Lecher D [[id number]]  . omeprazole (PRILOSEC) 20 MG capsule    Sig: Take 1 capsule (20 mg total) by mouth 2 (two) times daily before a meal.    Dispense:  180 capsule    Refill:  1    Order Specific Question:   Supervising Provider    Answer:   Beatrice Lecher D [[id number]]  . LORazepam (ATIVAN) 0.5 MG tablet    Sig: Take 1 tablet (0.5 mg total) by mouth daily as needed for anxiety (only as needed panic attacks).    Dispense:  30 tablet    Refill:  0    Order Specific Question:   Supervising Provider    Answer:   Beatrice Lecher D [[id number]]    Orders Placed This Encounter  Procedures  . CBC  . Comprehensive metabolic panel  . TSH  . Lipid panel  . Hemoglobin A1c     Return in about 6 months (around 07/09/2021) for HTN, HLD.    Gross side effects, risk and benefits, and alternatives of medications discussed with patient.  Patient is aware that all medications have potential side effects and we are unable to predict every side effect or drug-drug interaction that may occur.  Expresses verbal understanding and consents to current therapy plan and treatment regimen.  F-up preventative CPE in 1 year- reminded pt again, this is in addition to any chronic care visits.  Please see orders placed and AVS handed out to patient at the end of our visit for further patient instructions/ counseling done pertaining to today's office visit.     Subjective:     CPE HPI: Julia Zuniga is a 60 y.o. female who presents to [place] at [facility] today for a yearly health maintenance exam.   Health Maintenance Summary  - Reviewed and updated, unless pt declines services.  Last Cologuard or Colonoscopy:   03/26/2014- repeat in 10 years Family history of Colon CA:   Tobacco History Reviewed:  Y, never a smoker Alcohol and/or drug use:    No concerns; no use Exercise  Habits:   Not meeting AHA guidelines Dental Home:N Eye exams:Y Dermatology home:N  Female Health:  PAP Smear - last known results:  02/19/2017- normal STD concerns:   none Birth control method: postmenopausal  Menses regular: postmenopausal  Lumps or breast concerns:    none Breast Cancer Family History:    Personal hx of malignant carcinoma of right breast    Additional concerns beyond health maintenance issues: none    Immunization History  Administered Date(s) Administered  . Influenza,inj,Quad PF,6+ Mos 11/14/2016, 11/11/2017, 11/17/2018, 11/02/2019  . Influenza-Unspecified 10/20/2020  . PFIZER(Purple Top)SARS-COV-2 Vaccination 02/19/2020, 03/15/2020  . Tdap 05/16/2011     Health Maintenance  Topic Date Due  . Hepatitis C Screening  Never done  . PAP SMEAR-Modifier  02/20/2020  . COVID-19 Vaccine (3 - Pfizer risk 4-dose series) 04/12/2020  . HIV Screening  05/22/2027 (Originally 09/04/1976)  . TETANUS/TDAP  05/15/2021  . MAMMOGRAM  05/05/2022  . COLONOSCOPY (Pts 45-61yrs Insurance coverage will need to be confirmed)  03/26/2024  . INFLUENZA VACCINE  Completed     Wt Readings from Last 3 Encounters:  01/09/21 251 lb 9.6 oz (114.1 kg)  05/04/20 241 lb 9.6 oz (109.6 kg)  03/03/20 235 lb (106.6 kg)   BP Readings from Last 3 Encounters:  01/09/21 (!) 143/85  05/25/20 (!) 149/95  05/04/20 117/78   Pulse Readings from Last 3 Encounters:  01/09/21 79  05/25/20 90  05/04/20 76     Past Medical History:  Diagnosis Date  . Anemia   . Anxiety   . Breast cancer ([place]) 12/08/12   Right  . Diverticulitis    2012  . Diverticulosis   . GERD (gastroesophageal reflux disease)   . Heartburn   . 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 History of radiation therapy 05/13/13-06/18/13   right breast, 5000 cGy 25 sessions  . Hot flashes   . Hyperlipidemia   . Hypertension   . Personal history of chemotherapy   . Personal history of radiation therapy    . PONV (postoperative nausea and vomiting)       Past Surgical History:  Procedure Laterality Date  . BREAST BIOPSY    . BREAST LUMPECTOMY Right 12/31/2012  . BREAST LUMPECTOMY WITH SENTINEL LYMPH NODE BIOPSY  12/31/2012   Procedure: BREAST LUMPECTOMY WITH SENTINEL LYMPH NODE BX;  Surgeon: Stark Klein, MD;  Location: [place];  Service: General;  Laterality: Right;  . CESAREAN SECTION     x 3  . CHOLECYSTECTOMY  2001   lap choli  . PORT-A-CATH REMOVAL Left 02/24/2014   Procedure: REMOVAL PORT-A-CATH;  Surgeon: Stark Klein, MD;  Location: [place];  Service: General;  Laterality: Left;  . PORTACATH PLACEMENT  12/31/2012   Procedure: INSERTION PORT-A-CATH;  Surgeon:  Almond Lint, MD;  Location: [facility];  Service: General;  Laterality: Left;  Left Subclavian Vein  . RE-EXCISION OF BREAST CANCER,SUPERIOR MARGINS  01/14/2013   Procedure: RE-EXCISION OF BREAST CANCER,SUPERIOR MARGINS;  Surgeon: Almond Lint, MD;  Location: [facility];  Service: General;  Laterality: Right;  right breast re excision for markers   . TONSILLECTOMY AND ADENOIDECTOMY    . TUBAL LIGATION        Family History  Problem Relation Age of Onset  . Stomach cancer Father   . Lung cancer Maternal Uncle   . Skin cancer Maternal Grandmother   . Lung cancer Maternal Grandfather   . Lymphoma Cousin   . Colon cancer Neg Hx   . Esophageal cancer Neg Hx   . Rectal cancer Neg Hx       Social History   Substance and Sexual Activity  Drug Use No  ,   Social History   Substance and Sexual Activity  Alcohol Use Yes  . Alcohol/week: 0.0 standard drinks   Comment: occ  ,   Social History   Tobacco Use  Smoking Status Never Smoker  Smokeless Tobacco Never Used  ,   Social History   Substance and Sexual Activity  Sexual Activity Yes  . Birth control/protection: Post-menopausal, Surgical   Comment: Tubal ligation 2000    Current Outpatient Medications  on File Prior to Visit  Medication Sig Dispense Refill  . ibuprofen (ADVIL,MOTRIN) 200 MG tablet Take 400 mg by mouth every 6 (six) hours as needed. Pain     No current facility-administered medications on file prior to visit.    Allergies: Amlodipine, Chlorhexidine, Codeine, Medralone [methylprednisolone acetate], and Oxycodone-acetaminophen  Review of Systems: General:   Denies fever, chills, unexplained weight loss.  Optho/Auditory:   Denies visual changes, blurred vision/LOV Respiratory:   Denies SOB, DOE more than baseline levels.   Cardiovascular:   Denies chest pain, palpitations, new onset peripheral edema  Gastrointestinal:   Denies nausea, vomiting, diarrhea.  Genitourinary: Denies dysuria, freq/ urgency, flank pain or discharge from genitals.  Endocrine:     Denies hot or cold intolerance, polyuria, polydipsia. Musculoskeletal:   Denies unexplained myalgias, unexplained arthralgias, gait problems.  Skin:  Denies rash, suspicious lesions Neurological:     Denies dizziness, unexplained weakness, numbness  Psychiatric/Behavioral:   Denies mood changes, suicidal or homicidal ideations, hallucinations    Objective:    Blood pressure (!) 143/85, pulse 79, height 5\' 6"  (1.676 m), weight 251 lb 9.6 oz (114.1 kg), last menstrual period 06/24/2009, SpO2 96 %. Body mass index is 40.61 kg/m. General Appearance:    Alert, cooperative, no distress, appears stated age  Head:    Normocephalic, without obvious abnormality, atraumatic  Eyes:    PERRL, conjunctiva/corneas clear, EOM's intact, both eyes  Ears:    Normal TM's and external ear canals, right ear; MEE of left ear  Nose:   Nares normal, septum midline, mucosa normal, no drainage    or sinus tenderness  Throat:   Lips w/o lesion, mucosa moist, and tongue normal; teeth and   gums normal  Neck:   Supple, symmetrical, trachea midline, no adenopathy;    thyroid:  no enlargement/tenderness/nodules; no carotid   bruit or JVD  Back:      Symmetric, no curvature, ROM normal, no CVA tenderness  Lungs:     Clear to auscultation bilaterally, respirations unlabored, no       Wh/ R/ R  Chest Wall:    No  tenderness or gross deformity; normal excursion   Heart:    Regular rate and rhythm, S1 and S2 normal, no murmur, rub   or gallop  Breast Exam:    Deferred to Ob-Gyn  Abdomen:     Soft, non-tender, bowel sounds active all four quadrants, No  G/R/R, no masses, no organomegaly  Genitalia:   Deferred to Ob-Gyn  Rectal:   Deferred to Ob-Gyn  Extremities:   Extremities normal, atraumatic, no cyanosis or gross edema  Pulses:   2+ and symmetric all extremities  Skin:   Warm, dry, Skin color, texture, turgor normal, no obvious rashes or lesions Psych: No HI/SI, judgement and insight good, Euthymic mood. Full Affect.  Neurologic:   CNII-XII intact, normal strength, sensation and reflexes    Throughout

## 2021-01-09 NOTE — Patient Instructions (Signed)
Preventive Care 84-59 Years Old, Female Preventive care refers to lifestyle choices and visits with your health care provider that can promote health and wellness. This includes:  A yearly physical exam. This is also called an annual wellness visit.  Regular dental and eye exams.  Immunizations.  Screening for certain conditions.  Healthy lifestyle choices, such as: ? Eating a healthy diet. ? Getting regular exercise. ? Not using drugs or products that contain nicotine and tobacco. ? Limiting alcohol use. What can I expect for my preventive care visit? Physical exam Your health care provider will check your:  Height and weight. These may be used to calculate your BMI (body mass index). BMI is a measurement that tells if you are at a healthy weight.  Heart rate and blood pressure.  Body temperature.  Skin for abnormal spots. Counseling Your health care provider may ask you questions about your:  Past medical problems.  Family's medical history.  Alcohol, tobacco, and drug use.  Emotional well-being.  Home life and relationship well-being.  Sexual activity.  Diet, exercise, and sleep habits.  Work and work Statistician.  Access to firearms.  Method of birth control.  Menstrual cycle.  Pregnancy history. What immunizations do I need? Vaccines are usually given at various ages, according to a schedule. Your health care provider will recommend vaccines for you based on your age, medical history, and lifestyle or other factors, such as travel or where you work.   What tests do I need? Blood tests  Lipid and cholesterol levels. These may be checked every 5 years, or more often if you are over 3 years old.  Hepatitis C test.  Hepatitis B test. Screening  Lung cancer screening. You may have this screening every year starting at age 73 if you have a 30-pack-year history of smoking and currently smoke or have quit within the past 15 years.  Colorectal cancer  screening. ? All adults should have this screening starting at age 52 and continuing until age 17. ? Your health care provider may recommend screening at age 49 if you are at increased risk. ? You will have tests every 1-10 years, depending on your results and the type of screening test.  Diabetes screening. ? This is done by checking your blood sugar (glucose) after you have not eaten for a while (fasting). ? You may have this done every 1-3 years.  Mammogram. ? This may be done every 1-2 years. ? Talk with your health care provider about when you should start having regular mammograms. This may depend on whether you have a family history of breast cancer.  BRCA-related cancer screening. This may be done if you have a family history of breast, ovarian, tubal, or peritoneal cancers.  Pelvic exam and Pap test. ? This may be done every 3 years starting at age 10. ? Starting at age 11, this may be done every 5 years if you have a Pap test in combination with an HPV test. Other tests  STD (sexually transmitted disease) testing, if you are at risk.  Bone density scan. This is done to screen for osteoporosis. You may have this scan if you are at high risk for osteoporosis. Talk with your health care provider about your test results, treatment options, and if necessary, the need for more tests. Follow these instructions at home: Eating and drinking  Eat a diet that includes fresh fruits and vegetables, whole grains, lean protein, and low-fat dairy products.  Take vitamin and mineral supplements  as recommended by your health care provider.  Do not drink alcohol if: ? Your health care provider tells you not to drink. ? You are pregnant, may be pregnant, or are planning to become pregnant.  If you drink alcohol: ? Limit how much you have to 0-1 drink a day. ? Be aware of how much alcohol is in your drink. In the [place] one drink equals one 12 oz bottle of beer (355 mL), one 5 oz glass of  wine (148 mL), or one 1 oz glass of hard liquor (44 mL).   Lifestyle  Take daily care of your teeth and gums. Brush your teeth every morning and night with fluoride toothpaste. Floss one time each day.  Stay active. Exercise for at least 30 minutes 5 or more days each week.  Do not use any products that contain nicotine or tobacco, such as cigarettes, e-cigarettes, and chewing tobacco. If you need help quitting, ask your health care provider.  Do not use drugs.  If you are sexually active, practice safe sex. Use a condom or other form of protection to prevent STIs (sexually transmitted infections).  If you do not wish to become pregnant, use a form of birth control. If you plan to become pregnant, see your health care provider for a prepregnancy visit.  If told by your health care provider, take low-dose aspirin daily starting at age 50.  Find healthy ways to cope with stress, such as: ? Meditation, yoga, or listening to music. ? Journaling. ? Talking to a trusted person. ? Spending time with friends and family. Safety  Always wear your seat belt while driving or riding in a vehicle.  Do not drive: ? If you have been drinking alcohol. Do not ride with someone who has been drinking. ? When you are tired or distracted. ? While texting.  Wear a helmet and other protective equipment during sports activities.  If you have firearms in your house, make sure you follow all gun safety procedures. What's next?  Visit your health care provider once a year for an annual wellness visit.  Ask your health care provider how often you should have your eyes and teeth checked.  Stay up to date on all vaccines. This information is not intended to replace advice given to you by your health care provider. Make sure you discuss any questions you have with your health care provider. Document Revised: 08/30/2020 Document Reviewed: 08/07/2018 Elsevier Patient Education  2021 [employer]  

## 2021-01-10 LAB — COMPREHENSIVE METABOLIC PANEL
ALT: 17 IU/L (ref 0–32)
AST: 15 IU/L (ref 0–40)
Albumin/Globulin Ratio: 1.9 (ref 1.2–2.2)
Albumin: 4.9 g/dL (ref 3.8–4.9)
Alkaline Phosphatase: 126 IU/L — ABNORMAL HIGH (ref 44–121)
BUN/Creatinine Ratio: 21 (ref 9–23)
BUN: 13 mg/dL (ref 6–24)
Bilirubin Total: 0.3 mg/dL (ref 0.0–1.2)
CO2: 25 mmol/L (ref 20–29)
Calcium: 10 mg/dL (ref 8.7–10.2)
Chloride: 101 mmol/L (ref 96–106)
Creatinine, Ser: 0.61 mg/dL (ref 0.57–1.00)
GFR calc Af Amer: 116 mL/min/{1.73_m2} (ref >59)
GFR calc non Af Amer: 100 mL/min/{1.73_m2} (ref >59)
Globulin, Total: 2.6 g/dL (ref 1.5–4.5)
Glucose: 99 mg/dL (ref 65–99)
Potassium: 4.5 mmol/L (ref 3.5–5.2)
Sodium: 139 mmol/L (ref 134–144)
Total Protein: 7.5 g/dL (ref 6.0–8.5)

## 2021-01-10 LAB — CBC
Hematocrit: 41.4 % (ref 34.0–46.6)
Hemoglobin: 13.1 g/dL (ref 11.1–15.9)
MCH: 25.5 pg — ABNORMAL LOW (ref 26.6–33.0)
MCHC: 31.6 g/dL (ref 31.5–35.7)
MCV: 81 fL (ref 79–97)
Platelets: 268 10*3/uL (ref 150–450)
RBC: 5.14 x10E6/uL (ref 3.77–5.28)
RDW: 13.1 % (ref 11.7–15.4)
WBC: 11.5 10*3/uL — ABNORMAL HIGH (ref 3.4–10.8)

## 2021-01-10 LAB — HEMOGLOBIN A1C
Est. average glucose Bld gHb Est-mCnc: 128 mg/dL
Hgb A1c MFr Bld: 6.1 % — ABNORMAL HIGH (ref 4.8–5.6)

## 2021-01-10 LAB — LIPID PANEL
Chol/HDL Ratio: 3.7 ratio (ref 0.0–4.4)
Cholesterol, Total: 164 mg/dL (ref 100–199)
HDL: 44 mg/dL (ref >39)
LDL Chol Calc (NIH): 90 mg/dL (ref 0–99)
Triglycerides: 173 mg/dL — ABNORMAL HIGH (ref 0–149)
VLDL Cholesterol Cal: 30 mg/dL (ref 5–40)

## 2021-01-10 LAB — TSH: TSH: 1.7 u[IU]/mL (ref 0.450–4.500)

## 2021-03-20 ENCOUNTER — Other Ambulatory Visit: Payer: Self-pay | Admitting: Obstetrics and Gynecology

## 2021-03-20 DIAGNOSIS — Z1231 Encounter for screening mammogram for malignant neoplasm of breast: Secondary | ICD-10-CM

## 2021-05-11 ENCOUNTER — Encounter: Payer: Self-pay | Admitting: Oncology

## 2021-05-11 ENCOUNTER — Ambulatory Visit
Admission: RE | Admit: 2021-05-11 | Discharge: 2021-05-11 | Disposition: A | Discharge status: Home / Self Care | Payer: BC Managed Care – PPO | Source: Ambulatory Visit | Attending: Obstetrics and Gynecology | Admitting: Obstetrics and Gynecology

## 2021-05-11 ENCOUNTER — Other Ambulatory Visit: Payer: Self-pay

## 2021-05-11 DIAGNOSIS — Z1231 Encounter for screening mammogram for malignant neoplasm of breast: Secondary | ICD-10-CM | POA: Diagnosis not present

## 2021-07-11 ENCOUNTER — Other Ambulatory Visit: Payer: Self-pay

## 2021-07-11 ENCOUNTER — Encounter: Payer: Self-pay | Admitting: Physician Assistant

## 2021-07-11 ENCOUNTER — Ambulatory Visit: Payer: BC Managed Care – PPO | Admitting: Physician Assistant

## 2021-07-11 VITALS — BP 128/84 | HR 80 | Temp 98.9°F | Ht 66.0 in | Wt 241.2 lb

## 2021-07-11 DIAGNOSIS — I10 Essential (primary) hypertension: Secondary | ICD-10-CM | POA: Diagnosis not present

## 2021-07-11 DIAGNOSIS — Z6838 Body mass index (BMI) 38.0-38.9, adult: Secondary | ICD-10-CM

## 2021-07-11 DIAGNOSIS — E785 Hyperlipidemia, unspecified: Secondary | ICD-10-CM

## 2021-07-11 DIAGNOSIS — R7303 Prediabetes: Secondary | ICD-10-CM | POA: Diagnosis not present

## 2021-07-11 DIAGNOSIS — M7631 Iliotibial band syndrome, right leg: Secondary | ICD-10-CM

## 2021-07-11 DIAGNOSIS — K2100 Gastro-esophageal reflux disease with esophagitis, without bleeding: Secondary | ICD-10-CM

## 2021-07-11 DIAGNOSIS — F419 Anxiety disorder, unspecified: Secondary | ICD-10-CM

## 2021-07-11 MED ORDER — PREDNISONE 20 MG PO TABS
ORAL_TABLET | ORAL | 0 refills | Status: DC
Start: 2021-07-11 — End: 2022-01-11

## 2021-07-11 MED ORDER — LORAZEPAM 0.5 MG PO TABS: 0.5000 mg | ORAL_TABLET | Freq: Every day | ORAL | 0 refills | Status: DC | PRN

## 2021-07-11 MED ORDER — OMEPRAZOLE 20 MG PO CPDR: 20.0000 mg | DELAYED_RELEASE_CAPSULE | Freq: Two times a day (BID) | ORAL | 1 refills | Status: DC

## 2021-07-11 MED ORDER — CARVEDILOL 12.5 MG PO TABS: ORAL_TABLET | ORAL | 1 refills | Status: DC

## 2021-07-11 MED ORDER — ROSUVASTATIN CALCIUM 5 MG PO TABS: ORAL_TABLET | ORAL | 1 refills | Status: DC

## 2021-07-11 NOTE — Patient Instructions (Addendum)
Iliotibial Band Syndrome Rehab Ask your health care provider which exercises are safe for you. Do exercises exactly as told by your health care provider and adjust them as directed. It is normal to feel mild stretching, pulling, tightness, or discomfort as you do these exercises. Stop right away if you feel sudden pain or your pain gets significantly worse. Do not begin these exercises until told by your health care provider. Stretching and range-of-motion exercises These exercises warm up your muscles and joints and improve the movement andflexibility of your hip and pelvis. Quadriceps stretch, prone  Lie on your abdomen (prone position) on a firm surface, such as a bed or padded floor. Bend your left / right knee and reach back to hold your ankle or pant leg. If you cannot reach your ankle or pant leg, loop a belt around your foot and grab the belt instead. Gently pull your heel toward your buttocks. Your knee should not slide out to the side. You should feel a stretch in the front of your thigh and knee (quadriceps). Hold this position for __________ seconds. Repeat __________ times. Complete this exercise __________ times a day. Iliotibial band stretch An iliotibial band is a strong band of muscle tissue that runs from the outer side of your hip to the outer side of your thigh and knee. Lie on your side with your left / right leg in the top position. Bend both of your knees and grab your left / right ankle. Stretch out your bottom arm to help you balance. Slowly bring your top knee back so your thigh goes behind your trunk. Slowly lower your top leg toward the floor until you feel a gentle stretch on the outside of your left / right hip and thigh. If you do not feel a stretch and your knee will not fall farther, place the heel of your other foot on top of your knee and pull your knee down toward the floor with your foot. Hold this position for __________ seconds. Repeat __________ times.  Complete this exercise __________ times a day. Strengthening exercises These exercises build strength and endurance in your hip and pelvis. Enduranceis the ability to use your muscles for a long time, even after they get tired. Straight leg raises, side-lying This exercise strengthens the muscles that rotate the leg at the hip and move it away from your body (hip abductors). Lie on your side with your left / right leg in the top position. Lie so your head, shoulder, hip, and knee line up. You may bend your bottom knee to help you balance. Roll your hips slightly forward so your hips are stacked directly over each other and your left / right knee is facing forward. Tense the muscles in your outer thigh and lift your top leg 4-6 inches (10-15 cm). Hold this position for __________ seconds. Slowly lower your leg to return to the starting position. Let your muscles relax completely before doing another repetition. Repeat __________ times. Complete this exercise __________ times a day. Leg raises, prone This exercise strengthens the muscles that move the hips backward (hip extensors). Lie on your abdomen (prone position) on your bed or a firm surface. You can put a pillow under your hips if that is more comfortable for your lower back. Bend your left / right knee so your foot is straight up in the air. Squeeze your buttocks muscles and lift your left / right thigh off the bed. Do not let your back arch. Tense your thigh  muscle as hard as you can without increasing any knee pain. Hold this position for __________ seconds. Slowly lower your leg to return to the starting position and allow it to relax completely. Repeat __________ times. Complete this exercise __________ times a day. Hip hike Stand sideways on a bottom step. Stand on your left / right leg with your other foot unsupported next to the step. You can hold on to a railing or wall for balance if needed. Keep your knees straight and your  torso square. Then lift your left / right hip up toward the ceiling. Slowly let your left / right hip lower toward the floor, past the starting position. Your foot should get closer to the floor. Do not lean or bend your knees. Repeat __________ times. Complete this exercise __________ times a day. This information is not intended to replace advice given to you by your health care provider. Make sure you discuss any questions you have with your healthcare provider. Document Revised: 02/03/2020 Document Reviewed: 02/03/2020 Elsevier Patient Education  2022 [place] Band Syndrome  Iliotibial band syndrome is a condition that often causes knee pain. It can also cause pain in the outside of the hip, thigh, and knee. The iliotibial band is a strip of tissue in each of the legs. This band runs from the outside ofthe hip and down the thigh to the outside of the knee. Repeatedly bending and straightening your knee can irritate your iliotibialband. What are the causes? This condition is caused by inflammation from rubbing (friction) of the iliotibial band as it moves over the thigh bone (femur) when you bend and straighten your knee again and again. What increases the risk? You are more likely to develop this condition if: You change elevation often while running on a treadmill. You run very long distances. You recently increased the length or intensity of your workouts. Intensity means how much effort you put in. You run downhill often, or you just started running downhill. You ride a bike very far or often. You may also be at greater risk if: You start a new workout routine without first warming up your muscles. You have a job that requires you to bend, squat, or climb often. What are the signs or symptoms? Symptoms of this condition include: Pain along the outside of your knee that may be worse with activity, especially running or going up and down stairs. A feeling like a  snap over your knee or hip. Swelling on the outside of your knee. Pain or a feeling of tightness in your hip. How is this diagnosed? This condition is diagnosed based on: Your symptoms. Your medical history. A physical exam. You may also see a health care provider who specializes in reducing pain and improving movement (physical therapist). A physical therapist may do an exam to check your balance, movement, and way of walking or running (gait) to see whether the way you move could add to your injury. You may also havetests to measure your strength, flexibility, and range of motion. How is this treated? This condition may be treated by: Taking NSAIDs, such as ibuprofen, to help relieve pain and swelling. Resting and limiting exercise. Returning to activities gradually. Doing exercises to improve movement and strength (physical therapy) as told by your health care provider. Having an injection of steroid medicine. This is medicine that helps relieve inflammation. Having surgery. This may be done if your symptoms do not improve after other treatments. Follow these instructions at  home: Managing pain, stiffness, and swelling If directed, put ice on the injured area. To do this: Put ice in a plastic bag. Place a towel between your skin and the bag. Leave the ice on for 20 minutes, 2-3 times a day. Remove the ice if your skin turns bright red. This is very important. If you cannot feel pain, heat, or cold, you have a greater risk of damage to the area.  Activity Return to your normal activities as told by your health care provider. Ask your health care provider what activities are safe for you. Include low-impact activities, such as swimming, in your exercise routine. Check with your health care provider to make sure running is safe for you. Do exercises as told by your health care provider. General instructions Take over-the-counter and prescription medicines only as told by your health  care provider. Make sure you wear shoes that fit well and have good cushioning and arch support. Keep all follow-up visits. This is important. How is this prevented? Warm up and stretch before being active. Cool down and stretch after being active. Give your body time to rest between periods of activity. Consider getting help from a coach or trainer to come up with a safe running plan and a plan to advance (progress) your training that fits your goals and ability. Change directions often while running around a track. Replace your shoes when the soles are worn out. Maintain physical fitness. This includes strength and flexibility. Contact a health care provider if: Your pain does not improve. Your pain gets worse even with treatment. Summary Iliotibial band syndrome is a condition that often causes knee pain. It can also cause pain in the outside of your hip, thigh, and knee. Treatment includes taking NSAIDs, resting, returning to activities gradually, and doing physical therapy exercises. Return to your normal activities as told by your health care provider. Ask your health care provider what activities are safe for you. This information is not intended to replace advice given to you by your health care provider. Make sure you discuss any questions you have with your healthcare provider. Document Revised: 03/28/2020 Document Reviewed: 03/28/2020 Elsevier Patient Education  [place].

## 2021-07-11 NOTE — Progress Notes (Signed)
Established Patient Office Visit  Subjective:  Patient ID: Julia Zuniga, female    DOB: 1961-04-08  Age: 60 y.o. MRN: [id number]  CC:  Chief Complaint  Patient presents with   Follow-up   Hypertension   Hyperlipidemia    HPI Julia Zuniga presents for follow up on hypertension and hyperlipidemia. Patient has c/o intermittent right lateral knee/upper leg pain. Patient states her daughter graduated 04/24/2021 from [facility] and she did a lot of walking that day which she is not used to, then her pain started. Initially her knee was swollen which has resolved. States wearing a compression sleeve helps.    HTN: Pt denies chest pain, palpitations, dizziness or edema. Taking medication as directed without side effects. Checks BP at home and reports readings for this morning <120/80. Pt follows a low salt diet.   HLD: Pt taking medication as directed without issues. Denies myalgias/arthralgias or muscle weakness. Patient reports does not eat fried foods or red meat.    Prediabetes: Denies increased thirst or urination. Patient has reduced simple carbohydrates such as potatoes and bread.  Weight: States has been working on weight loss with diet changes (low fat and carbohydrate). Has been staying active with swimming. Patient wants to lose 10 more pounds in 6 months.   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   History of radiation therapy 05/13/13-06/18/13   right breast, 5000 cGy 25 sessions   Hot flashes    Hyperlipidemia    Hypertension    Personal history of chemotherapy    Personal history of radiation therapy    PONV (postoperative nausea and vomiting)     Past Surgical History:  Procedure Laterality Date   BREAST BIOPSY     BREAST LUMPECTOMY Right 12/31/2012    BREAST LUMPECTOMY WITH SENTINEL LYMPH NODE BIOPSY  12/31/2012   Procedure: BREAST LUMPECTOMY WITH SENTINEL LYMPH NODE BX;  Surgeon: Stark Klein, MD;  Location: [facility];  Service: General;  Laterality: Right;   CESAREAN SECTION     x 3   CHOLECYSTECTOMY  2001   lap choli   PORT-A-CATH REMOVAL Left 02/24/2014   Procedure: REMOVAL PORT-A-CATH;  Surgeon: Stark Klein, MD;  Location: [facility];  Service: General;  Laterality: Left;   PORTACATH PLACEMENT  12/31/2012   Procedure: INSERTION PORT-A-CATH;  Surgeon: Stark Klein, MD;  Location: [facility];  Service: General;  Laterality: Left;  Left Subclavian Vein   RE-EXCISION OF BREAST CANCER,SUPERIOR MARGINS  01/14/2013   Procedure: RE-EXCISION OF BREAST CANCER,SUPERIOR MARGINS;  Surgeon: Stark Klein, MD;  Location: [facility];  Service: General;  Laterality: Right;  right breast re excision for markers    TONSILLECTOMY AND ADENOIDECTOMY     TUBAL LIGATION      Family History  Problem Relation Age of Onset   Stomach cancer Father    Lung cancer Maternal Uncle    Skin cancer Maternal Grandmother    Lung cancer Maternal Grandfather    Lymphoma Cousin    Colon cancer Neg Hx    Esophageal cancer Neg Hx    Rectal cancer Neg Hx     Social History   Socioeconomic History   Marital  status: Married    Spouse name: Not on file   Number of children: Not on file   Years of education: Not on file   Highest education level: Not on file  Occupational History   Not on file  Tobacco Use   Smoking status: Never   Smokeless tobacco: Never  Substance and Sexual Activity   Alcohol use: Yes    Alcohol/week: 0.0 standard drinks    Comment: occ   Drug use: No   Sexual activity: Yes    Birth control/protection: Post-menopausal, Surgical    Comment: Tubal ligation 2000  Other Topics Concern   Not on file  Social History Narrative   Lives with husband and daughter in a 2 story home.  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     Does not work.  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    Education: college   Social Determinants of Radio broadcast assistant Strain: Not on file  Food Insecurity: Not on file  Transportation Needs: Not on file  Physical Activity: Not on file  Stress: Not on file  Social Connections: Not on file  Intimate Partner Violence: Not on file    Outpatient Medications Prior to Visit  Medication Sig Dispense Refill   ibuprofen (ADVIL,MOTRIN) 200 MG tablet Take 400 mg by mouth every 6 (six) hours as needed. Pain     carvedilol (COREG) 12.5 MG tablet TAKE 1 TABLET BY MOUTH 2 TIMES DAILY WITH A MEAL. 180 tablet 1   LORazepam (ATIVAN) 0.5 MG tablet Take 1 tablet (0.5 mg total) by mouth daily as needed for anxiety (only as needed panic attacks). 30 tablet 0   omeprazole (PRILOSEC) 20 MG capsule Take 1 capsule (20 mg total) by mouth 2 (two) times daily before a meal. 180 capsule 1   rosuvastatin (CRESTOR) 5 MG tablet TAKE 1 TABLET BY MOUTH NIGHTLY BEFORE BED. 90 tablet 1   No facility-administered medications prior to visit.    Allergies  Allergen Reactions   Amlodipine     Dizziness, lightheaded, breathing issues    Chlorhexidine     Patient skin gets bright red with rash.       Codeine Swelling   Medralone [Methylprednisolone Acetate]     "coming out of skin"    Oxycodone-Acetaminophen Other (See Comments)    Face got red and hot    ROS Review of Systems Review of Systems:  A fourteen system review of systems was performed and found to be positive as per HPI.   Objective:    Physical Exam General:  Well Developed, well nourished, in no acute distress  Neuro:  Alert and oriented,  extra-ocular muscles intact  HEENT:  Normocephalic, atraumatic, neck supple Skin:  no gross rash, warm, pink. Cardiac:  RRR, S1 S2 Respiratory:  CTA B/L, Not using accessory muscles, speaking in full sentences- unlabored. MSK: mild tenderness along  lateral knee and thigh (right), no joint line tenderness, no deformity, good ROM Vascular:  Ext warm, no cyanosis apprec.; cap RF less 2 sec. No edema  Psych:  No HI/SI, judgement and insight good, Euthymic mood. Full Affect.  BP 128/84   Pulse 80   Temp 98.9 F (37.2 C)   Ht _0  (1.676 m)   Wt 241 lb 3.2 oz (109.4 kg)   LMP 06/24/2009   SpO2 96%   BMI 38.93 kg/m  Wt Readings from Last 3 Encounters:  07/11/21 241 lb  3.2 oz (109.4 kg)  01/09/21 251 lb 9.6 oz (114.1 kg)  05/04/20 241 lb 9.6 oz (109.6 kg)     Health Maintenance Due  Topic Date Due   Pneumococcal Vaccine 89-81 Years old (1 - PCV) Never done   Hepatitis C Screening  Never done   Zoster Vaccines- Shingrix (1 of 2) Never done   PAP SMEAR-Modifier  02/20/2020   COVID-19 Vaccine (3 - Pfizer risk series) 04/12/2020   TETANUS/TDAP  05/15/2021   INFLUENZA VACCINE  07/10/2021    There are no preventive care reminders to display for this patient.  Lab Results  Component Value Date   TSH 1.700 01/09/2021   Lab Results  Component Value Date   WBC 11.5 (H) 01/09/2021   HGB 13.1 01/09/2021   HCT 41.4 01/09/2021   MCV 81 01/09/2021   PLT 268 01/09/2021   Lab Results  Component Value Date   NA 139 07/11/2021   K 4.6 07/11/2021   CHLORIDE 107 06/04/2017   CO2 21 07/11/2021   GLUCOSE 96 07/11/2021   BUN 13 07/11/2021   CREATININE 0.80 07/11/2021   BILITOT 0.3 07/11/2021   ALKPHOS 119 07/11/2021   AST 10 07/11/2021   ALT 17 07/11/2021   PROT 7.9 07/11/2021   ALBUMIN 5.1 (H) 07/11/2021   CALCIUM 10.1 07/11/2021   ANIONGAP 11 09/10/2018   EGFR 85 07/11/2021   Lab Results  Component Value Date   CHOL 164 01/09/2021   Lab Results  Component Value Date   HDL 44 01/09/2021   Lab Results  Component Value Date   LDLCALC 90 01/09/2021   Lab Results  Component Value Date   TRIG 173 (H) 01/09/2021   Lab Results  Component Value Date   CHOLHDL 3.7 01/09/2021   Lab Results  Component Value Date    HGBA1C 6.1 (H) 01/09/2021      Assessment & Plan:   Problem List Items Addressed This Visit       Cardiovascular and Mediastinum   Essential hypertension-  dx by Cards Jan '14 - Primary (Chronic)   Relevant Medications   carvedilol (COREG) 12.5 MG tablet   rosuvastatin (CRESTOR) 5 MG tablet   Other Relevant Orders   Comp Met (CMET) (Completed)     Digestive   GERD (gastroesophageal reflux disease) (Chronic)   Relevant Medications   omeprazole (PRILOSEC) 20 MG capsule     Other   Hyperlipidemia- dx by Cards Jan '14 (Chronic)   Relevant Medications   carvedilol (COREG) 12.5 MG tablet   rosuvastatin (CRESTOR) 5 MG tablet   Other Relevant Orders   Comp Met (CMET) (Completed)   Anxiety (Chronic)   Relevant Medications   LORazepam (ATIVAN) 0.5 MG tablet   Pre-diabetes   Other Visit Diagnoses     Class 2 severe obesity with serious comorbidity and body mass index (BMI) of 38.0 to 38.9 in adult, unspecified obesity type (HCC)       Iliotibial band syndrome of right side          Essential hypertension: -Controlled. -Current medication regimen.  Provided refill. -Will collect CMP for medication monitoring. -Will continue to monitor.  Hyperlipidemia: -Last lipid panel: total cholesterol 164, triglycerides 173, HDL 44, LDL 90 -Encourage to continue weight loss efforts and follow a low fat diet. -Continue current medication regimen. Will repeat lipid panel with CPE. -Will collect CMP for medication monitoring.  Prediabetes: -Last A1c 6.1, asymptomatic. -Will repeat A1c with CPE. -Continue low carbohydrate and glucose diet.  GERD: -Stable. -Continue current medication regimen. Provided refill. -Will continue to monitor.  Class II severe obesity with serious comorbidity and body mass index (BMI) of 38.0 to 38.9 in adult, unspecified obesity type: -Associated with hypertension and hyperlipidemia. -Patient has lost 10 pounds since last visit and encourage to continue  weight loss efforts with diet changes.  Iliotibial band syndrome of right side: -Symptoms have been ongoing for >8 weeks so will start corticosteroid therapy.  Provided handout with exercises.  Commend to apply ice and elevation as needed for any swelling. -If symptoms fail to improve or worsen recommend further evaluation.  Anxiety: -GAD-7 score of 0. -PDMP reviewed, no aberrancies noted. Lorazepam 0.5 mg last filled 01/20/2021. Provided refill. -Will continue to monitor.  Meds ordered this encounter  Medications   predniSONE (DELTASONE) 20 MG tablet    Sig: Take 2 tablets by mouth x 2 days, 1 tablets x 2 days, 0.5 tablet x 2 days    Dispense:  7 tablet    Refill:  0    Order Specific Question:   Supervising Provider    Answer:   Beatrice Lecher D [[id number]]   omeprazole (PRILOSEC) 20 MG capsule    Sig: Take 1 capsule (20 mg total) by mouth 2 (two) times daily before a meal.    Dispense:  180 capsule    Refill:  1    Order Specific Question:   Supervising Provider    Answer:   Beatrice Lecher D [[id number]]   carvedilol (COREG) 12.5 MG tablet    Sig: TAKE 1 TABLET BY MOUTH 2 TIMES DAILY WITH A MEAL.    Dispense:  180 tablet    Refill:  1    Order Specific Question:   Supervising Provider    Answer:   Beatrice Lecher D [[id number]]   rosuvastatin (CRESTOR) 5 MG tablet    Sig: TAKE 1 TABLET BY MOUTH NIGHTLY BEFORE BED.    Dispense:  90 tablet    Refill:  1    Order Specific Question:   Supervising Provider    Answer:   Beatrice Lecher D [[id number]]   LORazepam (ATIVAN) 0.5 MG tablet    Sig: Take 1 tablet (0.5 mg total) by mouth daily as needed for anxiety (only as needed panic attacks).    Dispense:  30 tablet    Refill:  0    Order Specific Question:   Supervising Provider    Answer:   Beatrice Lecher D [[id number]]    Follow-up: Return in about 6 months (around 01/11/2022) for CPE and FBW.   Note:  This note was prepared with assistance of Dragon voice recognition software.  Occasional wrong-word or sound-a-like substitutions may have occurred due to the inherent limitations of voice recognition software.  Lorrene Reid, PA-C

## 2021-07-12 LAB — COMPREHENSIVE METABOLIC PANEL
ALT: 17 IU/L (ref 0–32)
AST: 10 IU/L (ref 0–40)
Albumin/Globulin Ratio: 1.8 (ref 1.2–2.2)
Albumin: 5.1 g/dL — ABNORMAL HIGH (ref 3.8–4.9)
Alkaline Phosphatase: 119 IU/L (ref 44–121)
BUN/Creatinine Ratio: 16 (ref 9–23)
BUN: 13 mg/dL (ref 6–24)
Bilirubin Total: 0.3 mg/dL (ref 0.0–1.2)
CO2: 21 mmol/L (ref 20–29)
Calcium: 10.1 mg/dL (ref 8.7–10.2)
Chloride: 99 mmol/L (ref 96–106)
Creatinine, Ser: 0.8 mg/dL (ref 0.57–1.00)
Globulin, Total: 2.8 g/dL (ref 1.5–4.5)
Glucose: 96 mg/dL (ref 65–99)
Potassium: 4.6 mmol/L (ref 3.5–5.2)
Sodium: 139 mmol/L (ref 134–144)
Total Protein: 7.9 g/dL (ref 6.0–8.5)
eGFR: 85 mL/min/{1.73_m2} (ref >59)

## 2021-12-20 DIAGNOSIS — M25561 Pain in right knee: Secondary | ICD-10-CM | POA: Diagnosis not present

## 2022-01-05 ENCOUNTER — Other Ambulatory Visit: Payer: Self-pay | Admitting: Physician Assistant

## 2022-01-05 DIAGNOSIS — K2100 Gastro-esophageal reflux disease with esophagitis, without bleeding: Secondary | ICD-10-CM

## 2022-01-05 DIAGNOSIS — E785 Hyperlipidemia, unspecified: Secondary | ICD-10-CM

## 2022-01-05 DIAGNOSIS — I10 Essential (primary) hypertension: Secondary | ICD-10-CM

## 2022-01-11 ENCOUNTER — Encounter: Payer: Self-pay | Admitting: Physician Assistant

## 2022-01-11 ENCOUNTER — Ambulatory Visit (INDEPENDENT_AMBULATORY_CARE_PROVIDER_SITE_OTHER): Payer: BC Managed Care – PPO | Admitting: Physician Assistant

## 2022-01-11 ENCOUNTER — Telehealth: Payer: Self-pay | Admitting: Physician Assistant

## 2022-01-11 ENCOUNTER — Other Ambulatory Visit: Payer: Self-pay

## 2022-01-11 VITALS — BP 126/84 | HR 72 | Temp 97.0°F | Ht 66.0 in | Wt 236.0 lb

## 2022-01-11 DIAGNOSIS — R7303 Prediabetes: Secondary | ICD-10-CM | POA: Diagnosis not present

## 2022-01-11 DIAGNOSIS — E785 Hyperlipidemia, unspecified: Secondary | ICD-10-CM | POA: Diagnosis not present

## 2022-01-11 DIAGNOSIS — Z1329 Encounter for screening for other suspected endocrine disorder: Secondary | ICD-10-CM | POA: Diagnosis not present

## 2022-01-11 DIAGNOSIS — M2540 Effusion, unspecified joint: Secondary | ICD-10-CM

## 2022-01-11 DIAGNOSIS — Z0000 Encounter for general adult medical examination without abnormal findings: Secondary | ICD-10-CM

## 2022-01-11 DIAGNOSIS — I10 Essential (primary) hypertension: Secondary | ICD-10-CM

## 2022-01-11 NOTE — Progress Notes (Signed)
Complete physical exam   Patient: Julia Zuniga   DOB: 1961/01/17   61 y.o. Female  MRN: [id number] Visit Date: 01/11/2022   Chief Complaint  Patient presents with   Annual Exam   Subjective    Julia Zuniga is a 61 y.o. female who presents today for a complete physical exam.  She reports consuming a general and low sodium diet. Exercise is limited by neurologic condition(s): knee arthritis. She generally feels fairly well. She does have additional problems to discuss today- inflammation in hands and knees. Reports has noticed hands swelling especially in the mornings. Denies redness or warmth.    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   History of radiation therapy 05/13/13-06/18/13   right breast, 5000 cGy 25 sessions   Hot flashes    Hyperlipidemia    Hypertension    Personal history of chemotherapy    Personal history of radiation therapy    PONV (postoperative nausea and vomiting)    Past Surgical History:  Procedure Laterality Date   BREAST BIOPSY     BREAST LUMPECTOMY Right 12/31/2012   BREAST LUMPECTOMY WITH SENTINEL LYMPH NODE BIOPSY  12/31/2012   Procedure: BREAST LUMPECTOMY WITH SENTINEL LYMPH NODE BX;  Surgeon: Almond Lint, MD;  Location: [facility];  Service: General;  Laterality: Right;   CESAREAN SECTION     x 3   CHOLECYSTECTOMY  2001   lap choli   PORT-A-CATH REMOVAL Left 02/24/2014   Procedure: REMOVAL PORT-A-CATH;  Surgeon: Almond Lint, MD;  Location: [facility];  Service: General;  Laterality: Left;   PORTACATH PLACEMENT  12/31/2012   Procedure: INSERTION PORT-A-CATH;  Surgeon: Almond Lint, MD;  Location: [facility];  Service: General;  Laterality: Left;  Left Subclavian Vein    RE-EXCISION OF BREAST CANCER,SUPERIOR MARGINS  01/14/2013   Procedure: RE-EXCISION OF BREAST CANCER,SUPERIOR MARGINS;  Surgeon: Almond Lint, MD;  Location: [facility];  Service: General;  Laterality: Right;  right breast re excision for markers    TONSILLECTOMY AND ADENOIDECTOMY     TUBAL LIGATION     Social History   Socioeconomic History   Marital status: Married    Spouse name: Not on file   Number of children: Not on file   Years of education: Not on file   Highest education level: Not on file  Occupational History   Not on file  Tobacco Use   Smoking status: Never   Smokeless tobacco: Never  Substance and Sexual Activity   Alcohol use: Yes    Alcohol/week: 0.0 standard drinks    Comment: occ   Drug use: No   Sexual activity: Yes    Birth control/protection: Post-menopausal, Surgical    Comment: Tubal ligation 2000  Other Topics Concern   Not on file  Social History Narrative   Lives with husband and daughter in a 2 story home.  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     Does not work.  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    Education: college   Social Determinants of Corporate investment banker Strain: Not on file  Food Insecurity:  Not on file  Transportation Needs: Not on file  Physical Activity: Not on file  Stress: Not on file  Social Connections: Not on file  Intimate Partner Violence: Not on file     Medications: Outpatient Medications Prior to Visit  Medication Sig   carvedilol (COREG) 12.5 MG tablet TAKE 1 TABLET BY MOUTH 2 TIMES DAILY WITH A MEAL.   ibuprofen (ADVIL,MOTRIN) 200 MG tablet Take 400 mg by mouth every 6 (six) hours as needed. Pain   LORazepam (ATIVAN) 0.5 MG tablet Take 1 tablet (0.5 mg total) by mouth daily as needed for anxiety (only as needed panic attacks).   omeprazole (PRILOSEC) 20 MG capsule TAKE 1 CAPSULE BY MOUTH 2 TIMES DAILY BEFORE A MEAL.   rosuvastatin (CRESTOR) 5 MG tablet TAKE 1 TABLET BY MOUTH NIGHTLY  BEFORE BED.   [DISCONTINUED] predniSONE (DELTASONE) 20 MG tablet Take 2 tablets by mouth x 2 days, 1 tablets x 2 days, 0.5 tablet x 2 days   No facility-administered medications prior to visit.    Review of Systems Review of Systems:  A fourteen system review of systems was performed and found to be positive as per HPI.    Objective    BP (!) 144/83    Pulse 72    Temp (!) 97 F (36.1 C)    Ht $R'5\' 6"'Tg$  (1.676 m)    Wt 236 lb (107 kg)    LMP 06/24/2009    SpO2 98%    BMI 38.09 kg/m    Physical Exam   General Appearance:    Alert, cooperative, in no acute distress, appears stated age   Head:    Normocephalic, without obvious abnormality, atraumatic  Eyes:    PERRL, conjunctiva/corneas clear, EOM's intact, fundi    benign, both eyes  Ears:    Normal TM's and external ear canals, both ears  Nose:   Nares normal, septum midline, mucosa normal, no drainage    or sinus tenderness  Throat:   Lips, mucosa, and tongue normal; teeth and gums normal  Neck:   Supple, symmetrical, trachea midline, no adenopathy;    thyroid:  no enlargement/tenderness/nodules; no JVD  Back:     Symmetric, no curvature, ROM normal, no CVA tenderness  Lungs:     Clear to auscultation bilaterally, respirations unlabored  Chest Wall:    No tenderness or deformity   Heart:    Normal heart rate. Normal rhythm. No murmurs, rubs, or gallops.    Breast Exam:    deferred  Abdomen:     Soft, non-tender, bowel sounds active all four quadrants,    no masses, no organomegaly  Pelvic:    deferred  Extremities:   All extremities are intact. No cyanosis or LE edema  Pulses:   2+ and symmetric all extremities  Skin:   Skin color, texture, turgor normal, no rashes or lesions  Lymph nodes:   Cervical, supraclavicular, and axillary nodes normal  Neurologic:   CNII-XII grossly intact     Last depression screening scores PHQ 2/9 Scores 01/11/2022 07/11/2021 01/09/2021  PHQ - 2 Score 0 0 0  PHQ- 9 Score $Remov'1 3 2   'VMbWIw$ Last fall risk  screening Fall Risk  01/11/2022  Falls in the past year? 1  Number falls in past yr: 0  Injury with Fall? 1  Comment -  Risk for fall due to : History of fall(s);Impaired balance/gait  Follow up Falls evaluation completed     No results found for any visits  on 01/11/22.  Assessment & Plan    Routine Health Maintenance and Physical Exam  Exercise Activities and Dietary recommendations -Follow a heart healthy diet low in fat and carbohydrates.  Immunization History  Administered Date(s) Administered   Influenza,inj,Quad PF,6+ Mos 11/14/2016, 11/11/2017, 11/17/2018, 11/02/2019   Influenza-Unspecified 10/20/2020, 10/24/2021   PFIZER(Purple Top)SARS-COV-2 Vaccination 02/19/2020, 03/15/2020   Tdap 05/16/2011    Health Maintenance  Topic Date Due   Hepatitis C Screening  Never done   Zoster Vaccines- Shingrix (1 of 2) Never done   PAP SMEAR-Modifier  02/20/2020   COVID-19 Vaccine (3 - Pfizer risk series) 04/12/2020   TETANUS/TDAP  05/15/2021   HIV Screening  05/22/2027 (Originally 09/04/1976)   MAMMOGRAM  05/12/2023   COLONOSCOPY (Pts 45-1yrs Insurance coverage will need to be confirmed)  03/26/2024   INFLUENZA VACCINE  Completed   HPV VACCINES  Aged Out    Discussed health benefits of physical activity, and encouraged her to engage in regular exercise appropriate for her age and condition.  Problem List Items Addressed This Visit       Cardiovascular and Mediastinum   Essential hypertension-  dx by Cards Jan '14 (Chronic)   Relevant Orders   CBC w/Diff   Comp Met (CMET)     Other   Hyperlipidemia- dx by Cards Jan '14 (Chronic)   Relevant Orders   Lipid Profile   Pre-diabetes   Relevant Orders   HgB A1c   Other Visit Diagnoses     Healthcare maintenance    -  Primary   Relevant Orders   CBC w/Diff   Comp Met (CMET)   TSH   Joint swelling       Relevant Orders   Sedimentation rate   C-reactive protein   Screening for thyroid disorder       Relevant Orders    TSH      Will obtain routine fasting labs. Will add inflammatory markers (ESR, CRP). Patient has OB/GYN appointment in April 2023. Will request to forward pap results. Pt deferred Tdap until follow-up visit. Declined Hep C screening.    Return in about 6 months (around 07/11/2022) for HTN, HLD, mood.       Lorrene Reid, PA-C  [facility] at [facility] [phone] (phone) [phone] (fax)  [place]

## 2022-01-11 NOTE — Telephone Encounter (Signed)
Please let the patient know her meds were given a refill on 01/05/2022 for 90 days plus a refill.

## 2022-01-11 NOTE — Telephone Encounter (Signed)
Patient is aware 

## 2022-01-11 NOTE — Patient Instructions (Signed)

## 2022-01-11 NOTE — Telephone Encounter (Signed)
When patient was checking out she said she forgot to tell you she needed refills of all medication. Please advise.

## 2022-01-12 LAB — CBC WITH DIFFERENTIAL/PLATELET
Basophils Absolute: 0.1 10*3/uL (ref 0.0–0.2)
Basos: 1 %
EOS (ABSOLUTE): 0.2 10*3/uL (ref 0.0–0.4)
Eos: 2 %
Hematocrit: 39.2 % (ref 34.0–46.6)
Hemoglobin: 12.4 g/dL (ref 11.1–15.9)
Immature Grans (Abs): 0 10*3/uL (ref 0.0–0.1)
Immature Granulocytes: 0 %
Lymphocytes Absolute: 3.1 10*3/uL (ref 0.7–3.1)
Lymphs: 32 %
MCH: 25.4 pg — ABNORMAL LOW (ref 26.6–33.0)
MCHC: 31.6 g/dL (ref 31.5–35.7)
MCV: 80 fL (ref 79–97)
Monocytes Absolute: 0.5 10*3/uL (ref 0.1–0.9)
Monocytes: 5 %
Neutrophils Absolute: 5.9 10*3/uL (ref 1.4–7.0)
Neutrophils: 60 %
Platelets: 239 10*3/uL (ref 150–450)
RBC: 4.88 x10E6/uL (ref 3.77–5.28)
RDW: 13.1 % (ref 11.7–15.4)
WBC: 9.7 10*3/uL (ref 3.4–10.8)

## 2022-01-12 LAB — LIPID PANEL
Chol/HDL Ratio: 4.8 ratio — ABNORMAL HIGH (ref 0.0–4.4)
Cholesterol, Total: 181 mg/dL (ref 100–199)
HDL: 38 mg/dL — ABNORMAL LOW (ref >39)
LDL Chol Calc (NIH): 100 mg/dL — ABNORMAL HIGH (ref 0–99)
Triglycerides: 250 mg/dL — ABNORMAL HIGH (ref 0–149)
VLDL Cholesterol Cal: 43 mg/dL — ABNORMAL HIGH (ref 5–40)

## 2022-01-12 LAB — COMPREHENSIVE METABOLIC PANEL
ALT: 14 IU/L (ref 0–32)
AST: 12 IU/L (ref 0–40)
Albumin/Globulin Ratio: 2.1 (ref 1.2–2.2)
Albumin: 4.9 g/dL (ref 3.8–4.9)
Alkaline Phosphatase: 105 IU/L (ref 44–121)
BUN/Creatinine Ratio: 16 (ref 9–23)
BUN: 10 mg/dL (ref 6–24)
Bilirubin Total: 0.4 mg/dL (ref 0.0–1.2)
CO2: 23 mmol/L (ref 20–29)
Calcium: 9.9 mg/dL (ref 8.7–10.2)
Chloride: 101 mmol/L (ref 96–106)
Creatinine, Ser: 0.61 mg/dL (ref 0.57–1.00)
Globulin, Total: 2.3 g/dL (ref 1.5–4.5)
Glucose: 100 mg/dL — ABNORMAL HIGH (ref 70–99)
Potassium: 4.8 mmol/L (ref 3.5–5.2)
Sodium: 139 mmol/L (ref 134–144)
Total Protein: 7.2 g/dL (ref 6.0–8.5)
eGFR: 103 mL/min/{1.73_m2} (ref >59)

## 2022-01-12 LAB — C-REACTIVE PROTEIN: CRP: 3 mg/L (ref 0–10)

## 2022-01-12 LAB — SEDIMENTATION RATE: Sed Rate: 21 mm/hr (ref 0–40)

## 2022-01-12 LAB — HEMOGLOBIN A1C
Est. average glucose Bld gHb Est-mCnc: 131 mg/dL
Hgb A1c MFr Bld: 6.2 % — ABNORMAL HIGH (ref 4.8–5.6)

## 2022-01-12 LAB — TSH: TSH: 1.39 u[IU]/mL (ref 0.450–4.500)

## 2022-02-26 ENCOUNTER — Encounter: Payer: Self-pay | Admitting: Physician Assistant

## 2022-04-09 ENCOUNTER — Other Ambulatory Visit: Payer: Self-pay | Admitting: Obstetrics and Gynecology

## 2022-04-09 DIAGNOSIS — Z1231 Encounter for screening mammogram for malignant neoplasm of breast: Secondary | ICD-10-CM

## 2022-05-01 ENCOUNTER — Ambulatory Visit: Payer: BC Managed Care – PPO | Admitting: Orthopaedic Surgery

## 2022-05-01 ENCOUNTER — Ambulatory Visit (INDEPENDENT_AMBULATORY_CARE_PROVIDER_SITE_OTHER): Payer: BC Managed Care – PPO

## 2022-05-01 ENCOUNTER — Encounter: Payer: Self-pay | Admitting: Orthopaedic Surgery

## 2022-05-01 VITALS — Ht 66.0 in | Wt 241.2 lb

## 2022-05-01 DIAGNOSIS — G8929 Other chronic pain: Secondary | ICD-10-CM | POA: Diagnosis not present

## 2022-05-01 DIAGNOSIS — M25561 Pain in right knee: Secondary | ICD-10-CM

## 2022-05-01 MED ORDER — DIAZEPAM 5 MG PO TABS: ORAL_TABLET | ORAL | 0 refills | Status: DC

## 2022-05-01 NOTE — Progress Notes (Signed)
Office Visit Note   Patient: Julia Zuniga           Date of Birth: 03-10-1961           MRN: [id number] Visit Date: 05/01/2022              Requested by: Lorrene Reid, PA-C [place],  [place] [id number] PCP: Lorrene Reid, PA-C   Assessment & Plan: Visit Diagnoses:  1. Chronic pain of right knee     Plan: Due to her chronic right knee pain and mechanical block and not allowing her to actively or passively bring the knee to past 85 degrees of flexion recommend MRI to rule out internal derangement/meniscal tear.  Have her follow-up after the MRI to go over results discuss further treatment.  Questions were encouraged and answered by myself and Dr. Ninfa Linden.  She is claustrophobic and therefore we will send some Valium and to help her with this.  She will follow-up with [place] 3 to 4 days after the MRI to go over the results and discuss further treatment.  Follow-Up Instructions: Return After MRI.   Orders:  Orders Placed This Encounter  Procedures   XR Knee 1-2 Views Right   Meds ordered this encounter  Medications   DISCONTD: diazepam (VALIUM) 5 MG tablet    Sig: TAKE ONE TAB ONE HOUR PRIOR TO MRI REPEAT AS NEEDED #2 . ZERO REFILLS    Dispense:  2 tablet    Refill:  0   diazepam (VALIUM) 5 MG tablet    Sig: TAKE ONE TAB ONE HOUR PRIOR TO MRI REPEAT AS NEEDED #2 . ZERO REFILLS    Dispense:  2 tablet    Refill:  0      Procedures: No procedures performed   Clinical Data: No additional findings.   Subjective: Chief Complaint  Patient presents with   Right Knee - Pain    HPI Patient is 61 year old female comes in today with right knee pain.  Knee pains been ongoing since last May.  She reports that she was attending her daughter's graduation did a lot of walking sitting had knee pain after this.  She was treated with a knee sleeve which helps some.  Then she had a fall in July of last year onto the right leg and has had pain since then.   She was seen at [facility] where the knee was aspirated and the injection was given she states she did not receive much relief.  She does note that the time of her visit to [facility] that she had decreased range of motion of the knee and at the least had a since the fall in July.  She denies any mechanical symptoms in the knee.  Denies any swelling.  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  Describes it as a deep pain lateral thigh and goes into the shin but also pain in the posterior lateral aspect of the right knee. Review of Systems See HPI otherwise negative  Objective: Vital Signs: Ht '5\' 6"'$  (1.676 m)   Wt 241 lb 3.2 oz (109.4 kg)   LMP 06/24/2009   BMI 38.93 kg/m   Physical Exam General: Well-developed well-nourished female no acute distress Psych: Alert and oriented x3 Ortho Exam Left knee full range of motion without pain.  No instability valgus varus stressing.  No abnormal warmth erythema.  McMurray's negative. Right knee lacks full extension compared to left knee by 2 to 3 degrees and flexes to only 85 degrees varus passively and actively.  Slight effusion.  No abnormal warmth erythema.  Tenderness along the lateral joint line.  No gross instability valgus varus stressing.  Unable to perform McMurray's secondary to inability to flex the knee beyond 95 degrees.  Specialty Comments:  No specialty comments available.  Imaging: XR Knee 1-2 Views Right  Result Date: 05/01/2022 Right knee 2 views: No acute fracture.  Mild to moderate narrowing medial joint line.  Mild patellofemoral changes.  Lateral compartment well-preserved.  No bony abnormalities.  Normal bone density.  Knee is well located.    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 11/14/2016   BMI 38.0-38.9,adult 11/14/2016   S/P chemotherapy, time since greater than 12 weeks 11/14/2016   Heel pain 07/05/2016   History of Iron deficiency anemia- 2nd chemo induced Fe Def 05/10/2016   Fatigue 08/31/2015   GERD (gastroesophageal reflux disease) 08/11/2014   Anxiety 05/29/2014   Essential hypertension-  dx by [facility] Jan '14 01/07/2013   Hyperlipidemia- dx by [facility] Jan '14 01/07/2013   Malignant neoplasm of upper-outer quadrant of right breast in female, estrogen receptor negative ([place]) 12/12/2012   Past Medical History:  Diagnosis Date   Anemia    Anxiety    Breast cancer ([place]) 12/08/12   Right   Diverticulitis    2012   Diverticulosis    GERD (gastroesophageal reflux disease)    Heartburn    Hernia, hiatal    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   History of radiation therapy 05/13/13-06/18/13   right breast, 5000 cGy 25 sessions   Hot flashes    Hyperlipidemia    Hypertension    Personal history of chemotherapy    Personal history of radiation therapy    PONV (postoperative nausea and vomiting)     Family History  Problem Relation Age of Onset   Stomach cancer Father    Lung cancer Maternal Uncle    Skin cancer Maternal Grandmother    Lung cancer Maternal Grandfather    Lymphoma Cousin    Colon cancer Neg Hx    Esophageal cancer Neg Hx    Rectal cancer Neg Hx     Past Surgical History:  Procedure Laterality Date   BREAST BIOPSY     BREAST LUMPECTOMY Right 12/31/2012   BREAST LUMPECTOMY WITH SENTINEL LYMPH NODE BIOPSY  12/31/2012   Procedure: BREAST LUMPECTOMY WITH SENTINEL LYMPH NODE BX;  Surgeon: Stark Klein, MD;  Location: [facility];  Service: General;  Laterality: Right;   CESAREAN SECTION     x 3   CHOLECYSTECTOMY  2001   lap choli   PORT-A-CATH REMOVAL Left 02/24/2014   Procedure:  REMOVAL PORT-A-CATH;  Surgeon: Stark Klein, MD;  Location: [facility];  Service: General;  Laterality: Left;   PORTACATH PLACEMENT  12/31/2012   Procedure: INSERTION PORT-A-CATH;  Surgeon: Stark Klein, MD;  Location: [facility];  Service: General;  Laterality: Left;  Left Subclavian Vein   RE-EXCISION OF BREAST CANCER,SUPERIOR MARGINS  01/14/2013   Procedure: RE-EXCISION OF BREAST CANCER,SUPERIOR MARGINS;  Surgeon: Stark Klein, MD;  Location: [facility];  Service: General;  Laterality: Right;  right breast re excision for markers    TONSILLECTOMY AND ADENOIDECTOMY     TUBAL LIGATION     Social History   Occupational History   Not on file  Tobacco Use   Smoking status: Never   Smokeless tobacco: Never  Substance and Sexual Activity   Alcohol use: Yes    Alcohol/week: 0.0 standard drinks    Comment: occ   Drug use: No   Sexual activity: Yes    Birth control/protection: Post-menopausal, Surgical    Comment: Tubal ligation 2000

## 2022-05-01 NOTE — Addendum Note (Signed)
Addended by: Robyne Peers on: 05/01/2022 04:43 PM   Modules accepted: Orders

## 2022-05-02 ENCOUNTER — Encounter: Payer: Self-pay | Admitting: Oncology

## 2022-05-08 ENCOUNTER — Ambulatory Visit
Admission: RE | Admit: 2022-05-08 | Discharge: 2022-05-08 | Disposition: A | Discharge status: Home / Self Care | Payer: BC Managed Care – PPO | Source: Ambulatory Visit | Attending: Orthopaedic Surgery | Admitting: Orthopaedic Surgery

## 2022-05-08 DIAGNOSIS — M7989 Other specified soft tissue disorders: Secondary | ICD-10-CM | POA: Diagnosis not present

## 2022-05-08 DIAGNOSIS — M25461 Effusion, right knee: Secondary | ICD-10-CM | POA: Diagnosis not present

## 2022-05-08 DIAGNOSIS — M1711 Unilateral primary osteoarthritis, right knee: Secondary | ICD-10-CM | POA: Diagnosis not present

## 2022-05-08 DIAGNOSIS — G8929 Other chronic pain: Secondary | ICD-10-CM

## 2022-05-08 DIAGNOSIS — S83281A Other tear of lateral meniscus, current injury, right knee, initial encounter: Secondary | ICD-10-CM | POA: Diagnosis not present

## 2022-05-09 ENCOUNTER — Encounter: Payer: Self-pay | Admitting: Orthopaedic Surgery

## 2022-05-14 ENCOUNTER — Ambulatory Visit: Payer: BC Managed Care – PPO | Admitting: Physician Assistant

## 2022-05-14 ENCOUNTER — Encounter: Payer: Self-pay | Admitting: Physician Assistant

## 2022-05-14 DIAGNOSIS — S83281D Other tear of lateral meniscus, current injury, right knee, subsequent encounter: Secondary | ICD-10-CM | POA: Diagnosis not present

## 2022-05-14 NOTE — Progress Notes (Signed)
HPI: Julia Zuniga returns today to go over the MRI of her right knee.  She says she has had no change in overall pain and range of motion in the knee.  MRI of the right knee images are reviewed with the patient.  This shows torn lateral meniscus with a flap like segment along the posterior horn.  She has moderate arthritis involving all 3 compartments.  Degeneration of the PCL with likely partial tear in ACL with cystic lesion which probably represents a ganglion cyst.  Physical exam: Right knee she has full extension flexion 85 degrees passive and active left.  Tenderness along lateral joint line.  Impression: Right knee lateral meniscal tear  Plan: Given her meniscal tear in fact she was having no pain in the knee prior to her injury recommend right knee arthroscopy partial lateral meniscectomy.  Discussed with her the this will not help with the tricompartmental arthritis.  Risk benefits of surgery discussed with her at length.  Questions encouraged and answered by Dr. Ninfa Linden myself.  See her back 1 week postop

## 2022-05-15 ENCOUNTER — Ambulatory Visit: Payer: BC Managed Care – PPO

## 2022-05-15 ENCOUNTER — Ambulatory Visit
Admission: RE | Admit: 2022-05-15 | Discharge: 2022-05-15 | Disposition: A | Discharge status: Home / Self Care | Payer: BC Managed Care – PPO | Source: Ambulatory Visit | Attending: Obstetrics and Gynecology | Admitting: Obstetrics and Gynecology

## 2022-05-15 DIAGNOSIS — Z1231 Encounter for screening mammogram for malignant neoplasm of breast: Secondary | ICD-10-CM | POA: Diagnosis not present

## 2022-06-04 ENCOUNTER — Encounter: Payer: Self-pay | Admitting: Orthopaedic Surgery

## 2022-06-07 ENCOUNTER — Other Ambulatory Visit: Payer: Self-pay | Admitting: Orthopaedic Surgery

## 2022-06-07 DIAGNOSIS — M25461 Effusion, right knee: Secondary | ICD-10-CM | POA: Diagnosis not present

## 2022-06-07 DIAGNOSIS — S83271A Complex tear of lateral meniscus, current injury, right knee, initial encounter: Secondary | ICD-10-CM | POA: Diagnosis not present

## 2022-06-07 DIAGNOSIS — M948X6 Other specified disorders of cartilage, lower leg: Secondary | ICD-10-CM | POA: Diagnosis not present

## 2022-06-07 DIAGNOSIS — S83271D Complex tear of lateral meniscus, current injury, right knee, subsequent encounter: Secondary | ICD-10-CM | POA: Diagnosis not present

## 2022-06-07 DIAGNOSIS — M94261 Chondromalacia, right knee: Secondary | ICD-10-CM | POA: Diagnosis not present

## 2022-06-07 DIAGNOSIS — G8918 Other acute postprocedural pain: Secondary | ICD-10-CM | POA: Diagnosis not present

## 2022-06-07 DIAGNOSIS — X58XXXA Exposure to other specified factors, initial encounter: Secondary | ICD-10-CM | POA: Diagnosis not present

## 2022-06-07 DIAGNOSIS — Y999 Unspecified external cause status: Secondary | ICD-10-CM | POA: Diagnosis not present

## 2022-06-07 MED ORDER — OXYCODONE HCL 5 MG PO TABS: 5.0000 mg | ORAL_TABLET | Freq: Four times a day (QID) | ORAL | 0 refills | Status: DC | PRN

## 2022-06-08 ENCOUNTER — Other Ambulatory Visit: Payer: Self-pay | Admitting: Orthopaedic Surgery

## 2022-06-08 ENCOUNTER — Telehealth: Payer: Self-pay | Admitting: Orthopaedic Surgery

## 2022-06-08 MED ORDER — HYDROCODONE-ACETAMINOPHEN 7.5-325 MG PO TABS: 1.0000 | ORAL_TABLET | Freq: Four times a day (QID) | ORAL | 0 refills | Status: DC | PRN

## 2022-06-08 NOTE — Telephone Encounter (Signed)
Pt called requesting a call back from [facility] Gails or [place] has surgery yesterday and was prescribed pain medication that she is allergic too and need a new pain med script. Please call pt about this matter at [phone].

## 2022-06-14 ENCOUNTER — Ambulatory Visit (INDEPENDENT_AMBULATORY_CARE_PROVIDER_SITE_OTHER): Payer: BC Managed Care – PPO | Admitting: Orthopaedic Surgery

## 2022-06-14 ENCOUNTER — Encounter: Payer: Self-pay | Admitting: Orthopaedic Surgery

## 2022-06-14 DIAGNOSIS — Z9889 Other specified postprocedural states: Secondary | ICD-10-CM

## 2022-06-14 DIAGNOSIS — S83281D Other tear of lateral meniscus, current injury, right knee, subsequent encounter: Secondary | ICD-10-CM

## 2022-06-14 NOTE — Progress Notes (Signed)
The patient comes in at 1 week status post a right knee arthroscopy with partial lateral meniscectomy.  She reports that she is doing very well since surgery.  Her right knee does have a moderate effusion and I was able to aspirate 50 cc of bloody effusion from the knee and decided to place a steroid injection in the knee.  Her incision sites look good so remove the sutures.  She still has some problems with flexibility of the knee.  She would like to try the motion on her own since she is not having much pain and can get in a pool which she is already asking to and to work on [employer].  I would like to see her back in 4 weeks to see how she is doing overall with range of motion.  If she is not getting her motion back we would consider outpatient physical therapy.  All questions and concerns were answered and addressed.

## 2022-06-29 ENCOUNTER — Other Ambulatory Visit: Payer: Self-pay | Admitting: Physician Assistant

## 2022-06-29 DIAGNOSIS — E785 Hyperlipidemia, unspecified: Secondary | ICD-10-CM

## 2022-07-04 ENCOUNTER — Other Ambulatory Visit: Payer: Self-pay | Admitting: Physician Assistant

## 2022-07-04 DIAGNOSIS — K2100 Gastro-esophageal reflux disease with esophagitis, without bleeding: Secondary | ICD-10-CM

## 2022-07-04 DIAGNOSIS — I10 Essential (primary) hypertension: Secondary | ICD-10-CM

## 2022-07-12 ENCOUNTER — Ambulatory Visit: Payer: BC Managed Care – PPO | Admitting: Physician Assistant

## 2022-07-25 ENCOUNTER — Encounter: Payer: Self-pay | Admitting: Orthopaedic Surgery

## 2022-07-25 ENCOUNTER — Ambulatory Visit (INDEPENDENT_AMBULATORY_CARE_PROVIDER_SITE_OTHER): Payer: BC Managed Care – PPO | Admitting: Orthopaedic Surgery

## 2022-07-25 DIAGNOSIS — Z9889 Other specified postprocedural states: Secondary | ICD-10-CM

## 2022-07-25 MED ORDER — CYCLOBENZAPRINE HCL 10 MG PO TABS: 10.0000 mg | ORAL_TABLET | Freq: Three times a day (TID) | ORAL | 0 refills | Status: DC | PRN

## 2022-07-25 NOTE — Progress Notes (Signed)
HPI: [name] returns today for follow-up of right knee arthroscopy 06/07/2022.  She overall feels like she is getting better each day.  She is working on range of motion and strengthening the knee.  She unfortunately developed some back pain without radicular symptoms over the weekend took Flexeril.  She states her back pain is now much improved.  She is asking for refill on her Flexeril in case she has further back pain.  She is having no mechanical symptoms of the right knee.  Review of systems: See HPI otherwise negative  Physical exam: General well-developed well-nourished female who walks with a slow antalgic gait without any assistive device. Right knee: Port sites well-healed.  Full extension full flexion.  Slight patellofemoral crepitus with passive range of motion.  No abnormal warmth erythema.  Very mild effusion.  Impression: Status post right knee arthroscopy with partial lateral meniscectomy  Plan: We will send in Flexeril.  Have her follow-up with [place] as needed basis.  She will continue to work on [employer].  Need from her exercises are reviewed with her again today.  Follow-up as needed.

## 2022-08-03 ENCOUNTER — Other Ambulatory Visit: Payer: Self-pay | Admitting: Physician Assistant

## 2022-08-08 ENCOUNTER — Telehealth: Payer: BC Managed Care – PPO | Admitting: Family Medicine

## 2022-08-08 DIAGNOSIS — B9689 Other specified bacterial agents as the cause of diseases classified elsewhere: Secondary | ICD-10-CM | POA: Diagnosis not present

## 2022-08-08 DIAGNOSIS — J019 Acute sinusitis, unspecified: Secondary | ICD-10-CM

## 2022-08-08 MED ORDER — AMOXICILLIN-POT CLAVULANATE 875-125 MG PO TABS: 1.0000 | ORAL_TABLET | Freq: Two times a day (BID) | ORAL | 0 refills | Status: AC

## 2022-08-08 NOTE — Progress Notes (Signed)

## 2022-10-04 DIAGNOSIS — Z6839 Body mass index (BMI) 39.0-39.9, adult: Secondary | ICD-10-CM | POA: Diagnosis not present

## 2022-10-04 DIAGNOSIS — Z13820 Encounter for screening for osteoporosis: Secondary | ICD-10-CM | POA: Diagnosis not present

## 2022-10-04 DIAGNOSIS — N760 Acute vaginitis: Secondary | ICD-10-CM | POA: Diagnosis not present

## 2022-10-04 DIAGNOSIS — Z01419 Encounter for gynecological examination (general) (routine) without abnormal findings: Secondary | ICD-10-CM | POA: Diagnosis not present

## 2022-10-04 DIAGNOSIS — Z1151 Encounter for screening for human papillomavirus (HPV): Secondary | ICD-10-CM | POA: Diagnosis not present

## 2022-10-04 DIAGNOSIS — Z124 Encounter for screening for malignant neoplasm of cervix: Secondary | ICD-10-CM | POA: Diagnosis not present

## 2022-10-04 LAB — HM PAP SMEAR: HM Pap smear: NEGATIVE

## 2022-10-04 LAB — HM DEXA SCAN: HM Dexa Scan: NORMAL

## 2022-11-05 ENCOUNTER — Telehealth: Payer: BC Managed Care – PPO | Admitting: Physician Assistant

## 2022-11-05 DIAGNOSIS — J019 Acute sinusitis, unspecified: Secondary | ICD-10-CM | POA: Diagnosis not present

## 2022-11-05 DIAGNOSIS — B9689 Other specified bacterial agents as the cause of diseases classified elsewhere: Secondary | ICD-10-CM

## 2022-11-05 MED ORDER — AMOXICILLIN-POT CLAVULANATE 875-125 MG PO TABS: 1.0000 | ORAL_TABLET | Freq: Two times a day (BID) | ORAL | 0 refills | Status: DC

## 2022-11-05 NOTE — Progress Notes (Signed)

## 2022-11-14 ENCOUNTER — Ambulatory Visit
Admission: RE | Admit: 2022-11-14 | Discharge: 2022-11-14 | Disposition: A | Discharge status: Home / Self Care | Payer: BC Managed Care – PPO | Source: Ambulatory Visit | Attending: Emergency Medicine | Admitting: Emergency Medicine

## 2022-11-14 VITALS — BP 165/90 | HR 93 | Temp 98.5°F | Resp 18

## 2022-11-14 DIAGNOSIS — J45901 Unspecified asthma with (acute) exacerbation: Secondary | ICD-10-CM | POA: Diagnosis not present

## 2022-11-14 DIAGNOSIS — R053 Chronic cough: Secondary | ICD-10-CM

## 2022-11-14 DIAGNOSIS — J309 Allergic rhinitis, unspecified: Secondary | ICD-10-CM | POA: Diagnosis not present

## 2022-11-14 MED ORDER — AEROCHAMBER PLUS FLO-VU LARGE MISC: 1.0000 | Freq: Once | 0 refills | Status: AC

## 2022-11-14 MED ORDER — DEXAMETHASONE SODIUM PHOSPHATE 10 MG/ML IJ SOLN
10.0000 mg | Freq: Once | INTRAMUSCULAR | Status: AC
  Administered 2022-11-14: 10 mg via INTRAMUSCULAR

## 2022-11-14 MED ORDER — PROMETHAZINE-DM 6.25-15 MG/5ML PO SYRP: 5.0000 mL | ORAL_SOLUTION | Freq: Every evening | ORAL | 0 refills | Status: DC | PRN

## 2022-11-14 MED ORDER — LORATADINE 10 MG PO TABS: 10.0000 mg | ORAL_TABLET | Freq: Every day | ORAL | 1 refills | Status: DC

## 2022-11-14 MED ORDER — ALBUTEROL SULFATE HFA 108 (90 BASE) MCG/ACT IN AERS: 2.0000 | INHALATION_SPRAY | Freq: Four times a day (QID) | RESPIRATORY_TRACT | 2 refills | Status: DC | PRN

## 2022-11-14 MED ORDER — PREDNISONE 10 MG PO TABS: ORAL_TABLET | ORAL | 0 refills | Status: AC

## 2022-11-14 MED ORDER — GUAIFENESIN 100 MG/5ML PO LIQD: 200.0000 mg | Freq: Four times a day (QID) | ORAL | 0 refills | Status: DC | PRN

## 2022-11-14 MED ORDER — MOMETASONE FUROATE 50 MCG/ACT NA SUSP: 2.0000 | Freq: Every day | NASAL | 2 refills | Status: DC

## 2022-11-14 MED ORDER — ALBUTEROL SULFATE (2.5 MG/3ML) 0.083% IN NEBU
2.5000 mg | INHALATION_SOLUTION | Freq: Once | RESPIRATORY_TRACT | Status: AC
  Administered 2022-11-14: 2.5 mg via RESPIRATORY_TRACT

## 2022-11-14 MED ORDER — IPRATROPIUM BROMIDE 0.06 % NA SOLN: 2.0000 | Freq: Three times a day (TID) | NASAL | 1 refills | Status: DC

## 2022-11-14 MED ORDER — OLOPATADINE HCL 0.2 % OP SOLN: 1.0000 [drp] | Freq: Every day | OPHTHALMIC | 1 refills | Status: DC

## 2022-11-14 NOTE — ED Provider Notes (Signed)
[facility]    CSN: [id number] Arrival date & time: 11/14/22  7893    HISTORY   Chief Complaint  Patient presents with   Cough    e-visit 11/05/22 treated for sinusitis and antibiotic prescribed. After third day of taken  medication developed cough. Now have wheezing and crackling when I exhale. Mucus is light yellow. - Entered by patient   Ear Pain   Back Pain   Wheezing   Fatigue   Dizziness   Nasal Congestion   HPI Julia Zuniga is a pleasant, 61 y.o. female who presents to urgent care today. Patient complains of cough productive of clear and sometimes yellow mucus, discomfort in her left ear, mid back pain, nasal drainage, headache, fatigue, oxygen saturations between 90-93 at home.  Patient recently completed a 7-day course of Augmentin which was prescribed for her on November 27 during a televisit for presumed bacterial sinusitis, at the time patient had had symptoms for several days of sinus pressure and congestion.  Patient reports negative home COVID-19 test.  States that since completing Augmentin, she has been taking Sudafed, NyQuil, DayQuil and Delsym without relief.  Patient states she takes Claritin every day, states Zyrtec and Allegra did not work for her.  The history is provided by the patient.   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   History of radiation therapy 05/13/13-06/18/13   right breast, 5000 cGy 25 sessions   Hot flashes    Hyperlipidemia    Hypertension    Personal history of chemotherapy    Personal history of radiation therapy    PONV (postoperative nausea and vomiting)    Patient Active Problem List   Diagnosis Date Noted   Pre-diabetes 05/20/2019   Lateral epicondylitis of left elbow 07/15/2018   GAD  (generalized anxiety disorder) 05/22/2017   Family history of stomach cancer- dad died age 31 01/03/2017   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 11/14/2016   BMI 38.0-38.9,adult 11/14/2016   S/P chemotherapy, time since greater than 12 weeks 11/14/2016   Heel pain 07/05/2016   History of Iron deficiency anemia- 2nd chemo induced Fe Def 05/10/2016   Fatigue 08/31/2015   GERD (gastroesophageal reflux disease) 08/11/2014   Anxiety 05/29/2014   Essential hypertension-  dx by [facility] Jan '14 01/07/2013   Hyperlipidemia- dx by [facility] Jan '14 01/07/2013   Malignant neoplasm of upper-outer quadrant of right breast in female, estrogen receptor negative ([place]) 12/12/2012   Past Surgical History:  Procedure Laterality Date   BREAST BIOPSY     BREAST LUMPECTOMY Right 12/31/2012   BREAST LUMPECTOMY WITH SENTINEL LYMPH NODE BIOPSY  12/31/2012   Procedure: BREAST LUMPECTOMY WITH SENTINEL LYMPH NODE BX;  Surgeon: Stark Klein, MD;  Location: [facility];  Service: General;  Laterality: Right;   CESAREAN SECTION     x 3   CHOLECYSTECTOMY  2001   lap choli   PORT-A-CATH REMOVAL Left 02/24/2014   Procedure: REMOVAL PORT-A-CATH;  Surgeon: Stark Klein, MD;  Location: [facility];  Service: General;  Laterality: Left;   PORTACATH PLACEMENT  12/31/2012   Procedure: INSERTION PORT-A-CATH;  Surgeon: Stark Klein, MD;  Location: [place] [facility];  Service: General;  Laterality: Left;  Left Subclavian Vein   RE-EXCISION OF BREAST CANCER,SUPERIOR MARGINS  01/14/2013   Procedure: RE-EXCISION OF BREAST CANCER,SUPERIOR MARGINS;  Surgeon: Stark Klein, MD;  Location: [facility];  Service: General;  Laterality: Right;  right breast re excision for markers    TONSILLECTOMY AND ADENOIDECTOMY     TUBAL LIGATION     OB History   No obstetric history on file.    Home Medications    Prior to Admission medications   Medication Sig Start Date End Date Taking?  Authorizing Provider  amoxicillin-clavulanate (AUGMENTIN) 875-125 MG tablet Take 1 tablet by mouth 2 (two) times daily. 11/05/22   Mar Daring, PA-C  carvedilol (COREG) 12.5 MG tablet TAKE 1 TABLET BY MOUTH 2 TIMES DAILY WITH A MEAL. 07/04/22   Abonza, Maritza, PA-C  cyclobenzaprine (FLEXERIL) 10 MG tablet TAKE 1 TABLET (10 MG TOTAL) BY MOUTH 3 (THREE) TIMES DAILY AS NEEDED FOR MUSCLE SPASMS. 08/03/22   Mcarthur Rossetti, MD  HYDROcodone-acetaminophen (NORCO) 7.5-325 MG tablet Take 1 tablet by mouth every 6 (six) hours as needed for moderate pain. 06/08/22   Mcarthur Rossetti, MD  ibuprofen (ADVIL,MOTRIN) 200 MG tablet Take 400 mg by mouth every 6 (six) hours as needed. Pain    [provider]  LORazepam (ATIVAN) 0.5 MG tablet Take 1 tablet (0.5 mg total) by mouth daily as needed for anxiety (only as needed panic attacks). 07/11/21   Lorrene Reid, PA-C  omeprazole (PRILOSEC) 20 MG capsule TAKE 1 CAPSULE BY MOUTH 2 TIMES DAILY BEFORE A MEAL. 07/04/22   Abonza, Maritza, PA-C  rosuvastatin (CRESTOR) 5 MG tablet TAKE 1 TABLET BY MOUTH NIGHTLY BEFORE BED. 07/02/22   Lorrene Reid, PA-C    Family History Family History  Problem Relation Age of Onset   Stomach cancer Father    Lung cancer Maternal Uncle    Skin cancer Maternal Grandmother    Lung cancer Maternal Grandfather    Lymphoma Cousin    Colon cancer Neg Hx    Esophageal cancer Neg Hx    Rectal cancer Neg Hx    Social History Social History   Tobacco Use   Smoking status: Never   Smokeless tobacco: Never  Substance Use Topics   Alcohol use: Yes    Alcohol/week: 0.0 standard drinks of alcohol    Comment: occ   Drug use: No   Allergies   Amlodipine, Chlorhexidine, Codeine, Medralone [methylprednisolone acetate], and Oxycodone-acetaminophen  Review of Systems Review of Systems Pertinent findings revealed after performing a 14 point review of systems has been noted in the history of present  illness.  Physical Exam Triage Vital Signs ED Triage Vitals  Enc Vitals Group     BP 10/06/21 0827 (!) 147/82     Pulse Rate 10/06/21 0827 72     Resp 10/06/21 0827 18     Temp 10/06/21 0827 98.3 F (36.8 C)     Temp Source 10/06/21 0827 Oral     SpO2 10/06/21 0827 98 %     Weight --      Height --      Head Circumference --      Peak Flow --      Pain Score 10/06/21 0826 5     Pain Loc --      Pain Edu? --      Excl. in [place]? --   No data found.  Updated Vital Signs BP (!) 165/90 (BP Location: Left Arm)  Pulse 93   Temp 98.5 F (36.9 C) (Oral)   Resp 18   LMP 01/07/2009   SpO2 94%   Physical Exam Vitals and nursing note reviewed.  Constitutional:      General: She is not in acute distress.    Appearance: Normal appearance. She is not ill-appearing.  HENT:     Head: Normocephalic and atraumatic.     Salivary Glands: Right salivary gland is not diffusely enlarged or tender. Left salivary gland is not diffusely enlarged or tender.     Right Ear: Ear canal and external ear normal. No drainage. A middle ear effusion is present. There is no impacted cerumen. Tympanic membrane is bulging. Tympanic membrane is not injected or erythematous.     Left Ear: Ear canal and external ear normal. No drainage. A middle ear effusion is present. There is no impacted cerumen. Tympanic membrane is bulging. Tympanic membrane is not injected or erythematous.     Ears:     Comments: Bilateral EACs normal, both TMs bulging with clear fluid    Nose: Rhinorrhea present. No nasal deformity, septal deviation, signs of injury, nasal tenderness, mucosal edema or congestion. Rhinorrhea is clear.     Right Nostril: Occlusion present. No foreign body, epistaxis or septal hematoma.     Left Nostril: Occlusion present. No foreign body, epistaxis or septal hematoma.     Right Turbinates: Enlarged, swollen and pale.     Left Turbinates: Enlarged, swollen and pale.     Right Sinus: No maxillary sinus  tenderness or frontal sinus tenderness.     Left Sinus: No maxillary sinus tenderness or frontal sinus tenderness.     Mouth/Throat:     Lips: Pink. No lesions.     Mouth: Mucous membranes are moist. No oral lesions.     Pharynx: Oropharynx is clear. Uvula midline. No posterior oropharyngeal erythema or uvula swelling.     Tonsils: No tonsillar exudate. 0 on the right. 0 on the left.     Comments: Postnasal drip Eyes:     General: Lids are normal.        Right eye: No discharge.        Left eye: No discharge.     Extraocular Movements: Extraocular movements intact.     Conjunctiva/sclera: Conjunctivae normal.     Right eye: Right conjunctiva is not injected.     Left eye: Left conjunctiva is not injected.  Neck:     Trachea: Trachea and phonation normal.  Cardiovascular:     Rate and Rhythm: Normal rate and regular rhythm.     Pulses: Normal pulses.     Heart sounds: Normal heart sounds. No murmur heard.    No friction rub. No gallop.  Pulmonary:     Effort: Pulmonary effort is normal. No tachypnea, bradypnea, accessory muscle usage, prolonged expiration, respiratory distress or retractions.     Breath sounds: No stridor, decreased air movement or transmitted upper airway sounds. Examination of the right-upper field reveals wheezing. Examination of the left-upper field reveals wheezing. Examination of the right-middle field reveals wheezing. Examination of the left-middle field reveals wheezing. Examination of the right-lower field reveals decreased breath sounds and wheezing. Examination of the left-lower field reveals decreased breath sounds and wheezing. Decreased breath sounds and wheezing present. No rhonchi or rales.  Chest:     Chest wall: No tenderness.  Musculoskeletal:        General: Normal range of motion.     Cervical back: Normal range of  motion and neck supple. Normal range of motion.  Lymphadenopathy:     Cervical: No cervical adenopathy.  Skin:    General: Skin is  warm and dry.     Findings: No erythema or rash.  Neurological:     General: No focal deficit present.     Mental Status: She is alert and oriented to person, place, and time.  Psychiatric:        Mood and Affect: Mood normal.        Behavior: Behavior normal.     Visual Acuity Right Eye Distance:   Left Eye Distance:   Bilateral Distance:    Right Eye Near:   Left Eye Near:    Bilateral Near:     UC Couse / Diagnostics / Procedures:     Radiology No results found.  Procedures Procedures (including critical care time) EKG  Pending results:  Labs Reviewed - No data to display  Medications Ordered in UC: Medications  albuterol (PROVENTIL) (2.5 MG/3ML) 0.083% nebulizer solution 2.5 mg (2.5 mg Nebulization Given 11/14/22 0926)  dexamethasone (DECADRON) injection 10 mg (10 mg Intramuscular Given 11/14/22 0926)    UC Diagnoses / Final Clinical Impressions(s)   I have reviewed the triage vital signs and the nursing notes.  Pertinent labs & imaging results that were available during my care of the patient were reviewed by me and considered in my medical decision making (see chart for details).    Final diagnoses:  Bronchitis, allergic, unspecified asthma severity, with acute exacerbation  Allergic rhinitis, unspecified seasonality, unspecified trigger  Persistent cough for 3 weeks or longer   Patient provided with an injection of Decadron and nebulized albuterol treatment in her visit today.  Patient advised to continue albuterol at home and to finish 3-day course of prednisone as well.  Patient provided with allergy medications for relief of upper respiratory and lower respiratory inflammation.  Please see discharge instructions below for further details of plan of care.  Return precautions advised.  ED Prescriptions     Medication Sig Dispense Auth. Provider   albuterol (VENTOLIN HFA) 108 (90 Base) MCG/ACT inhaler Inhale 2 puffs into the lungs every 6 (six) hours as  needed for wheezing or shortness of breath (Cough). 36 g Lynden Oxford Scales, PA-C   Spacer/Aero-Holding Chambers (AEROCHAMBER PLUS FLO-VU LARGE) MISC 1 each by Other route once for 1 dose. 1 each Lynden Oxford Scales, PA-C   loratadine (CLARITIN) 10 MG tablet Take 1 tablet (10 mg total) by mouth daily. 90 tablet Lynden Oxford Scales, PA-C   ipratropium (ATROVENT) 0.06 % nasal spray Place 2 sprays into both nostrils 3 (three) times daily. As needed for nasal congestion, runny nose 15 mL Lynden Oxford Scales, PA-C   predniSONE (DELTASONE) 10 MG tablet Take 3 tablets (30 mg total) by mouth daily with breakfast for 1 day, THEN 2 tablets (20 mg total) daily with breakfast for 1 day, THEN 1 tablet (10 mg total) daily with breakfast for 1 day. 6 tablet Lynden Oxford Scales, PA-C   mometasone (NASONEX) 50 MCG/ACT nasal spray Place 2 sprays into the nose daily. 1 each Lynden Oxford Scales, PA-C   Olopatadine HCl (PATADAY) 0.2 % SOLN Apply 1 drop to eye daily. 2.5 mL Lynden Oxford Scales, PA-C   guaiFENesin (ROBITUSSIN) 100 MG/5ML liquid Take 10-20 mLs (200-400 mg total) by mouth every 6 (six) hours as needed for cough or to loosen phlegm. 473 mL Lynden Oxford Scales, PA-C   promethazine-dextromethorphan (PROMETHAZINE-DM) 6.25-15 MG/5ML syrup Take  5 mLs by mouth at bedtime as needed for cough. 60 mL Lynden Oxford Scales, PA-C      PDMP not reviewed this encounter.  Disposition Upon Discharge:  Condition: stable for discharge home Home: take medications as prescribed; routine discharge instructions as discussed; follow up as advised.  Patient presented with an acute illness with associated systemic symptoms and significant discomfort requiring urgent management. In my opinion, this is a condition that a prudent lay person (someone who possesses an average knowledge of health and medicine) may potentially expect to result in complications if not addressed urgently such as respiratory  distress, impairment of bodily function or dysfunction of bodily organs.   Routine symptom specific, illness specific and/or disease specific instructions were discussed with the patient and/or caregiver at length.   As such, the patient has been evaluated and assessed, work-up was performed and treatment was provided in alignment with urgent care protocols and evidence based medicine.  Patient/parent/caregiver has been advised that the patient may require follow up for further testing and treatment if the symptoms continue in spite of treatment, as clinically indicated and appropriate.  If the patient was tested for COVID-19, Influenza and/or RSV, then the patient/parent/guardian was advised to isolate at home pending the results of his/her diagnostic coronavirus test and potentially longer if they're positive. I have also advised pt that if his/her COVID-19 test returns positive, it's recommended to self-isolate for at least 10 days after symptoms first appeared AND until fever-free for 24 hours without fever reducer AND other symptoms have improved or resolved. Discussed self-isolation recommendations as well as instructions for household member/close contacts as per the [facility] and [place] DHHS, and also gave patient the [place] packet with this information.  Patient/parent/caregiver has been advised to return to the [facility] or PCP in 3-5 days if no better; to PCP or the Emergency Department if new signs and symptoms develop, or if the current signs or symptoms continue to change or worsen for further workup, evaluation and treatment as clinically indicated and appropriate  The patient will follow up with their current PCP if and as advised. If the patient does not currently have a PCP we will assist them in obtaining one.   The patient may need specialty follow up if the symptoms continue, in spite of conservative treatment and management, for further workup, evaluation, consultation and treatment as clinically  indicated and appropriate.  Patient/parent/caregiver verbalized understanding and agreement of plan as discussed.  All questions were addressed during visit.  Please see discharge instructions below for further details of plan.  Discharge Instructions:   Discharge Instructions      At this time, I believe that your allergies are also affecting your lungs.  We call this allergic bronchitis.  Whether the inflammation in your lungs is related to a virus or to allergies, the treatment is the same.  Please read below to learn more about the medications, dosages and frequencies that I recommend to help alleviate your symptoms and to get you feeling better soon:  Decadron IM (dexamethasone):  To quickly address your significant respiratory inflammation, you were provided with an injection of Decadron in the office today.  You should continue to feel the full benefit of the steroid for the next 12-24 hours.    Prednisone: This is a steroid that will significantly calm your upper and lower airways, please take the daily recommended quantity of tablets daily with your breakfast meal starting tomorrow morning until the prescription is complete.  Claritin (loratadine): This is an excellent second-generation antihistamine that helps to reduce respiratory inflammatory response to environmental allergens.  This medication is not known to cause daytime sleepiness so it can be taken in the daytime.  If you find that it does make you sleepy, please feel free to take it at bedtime.   Nasonex (mometasone): This is a steroid nasal spray that you use once daily, 2 sprays in each nare.  This medication does not work well if you decide to use it only used as you feel you need to, it works best used on a daily basis.  After 3 to 5 days of use, you will notice significant reduction of the inflammation and mucus production that is currently being caused by exposure to allergens, whether seasonal or environmental.  The  most common side effect of this medication is nosebleeds.  If you experience a nosebleed, please discontinue use for 1 week, then feel free to resume.  I have provided you with a prescription.     Atrovent (ipratropium): This is an excellent nasal decongestant spray I have added to your recommended nasal steroid that will not cause rebound congestion, please instill 2 sprays into each nare with each use.  Because nasal steroids can take several days before they begin to provide full benefit, I recommend that you use this spray in addition to the nasal steroid prescribed for you.  Please use it after you have used your nasal steroid and repeat up to 4 times daily as needed.  I have provided you with a prescription for this medication.      Robitussin, Mucinex (guaifenesin): This is an expectorant.  This helps break up chest congestion and loosen up thick nasal drainage making phlegm and drainage more liquid and therefore easier to remove.  I recommend being 400 mg three times daily as needed.      Promethazine DM: Promethazine is both a nasal decongestant and an antinausea medication that makes most patients feel fairly sleepy.  The DM is dextromethorphan, a cough suppressant found in many over-the-counter cough medications.  Please take 5 mL before bedtime to minimize your cough which will help you sleep better.  I have sent a prescription for this medication to your pharmacy.   If you find that you have not had improvement of your symptoms in the next 5 to 7 days, please follow-up with your primary care provider or return here to urgent care for repeat evaluation and further recommendations.         Thank you for visiting urgent care today.  We appreciate the opportunity to participate in your care.         This office note has been dictated using Museum/gallery curator.  Unfortunately, this method of dictation can sometimes lead to typographical or grammatical errors.  I apologize  for your inconvenience in advance if this occurs.  Please do not hesitate to reach out to me if clarification is needed.      Lynden Oxford Scales, PA-C 11/14/22 1246

## 2022-11-14 NOTE — ED Triage Notes (Signed)
The patient was diagnosed with sinusitis and began Augmentin around 11/05/22. The patient states she began having a productive cough, left ear discomfort, back pain, wheezing, dizziness, nasal drainage, headache, fatigue and oxygent saturation levels around 92-93%.  Home interventions: Sudafed, nyquil, dayquil, delsym

## 2022-11-14 NOTE — Discharge Instructions (Signed)
At this time, I believe that your allergies are also affecting your lungs.  We call this allergic bronchitis.  Whether the inflammation in your lungs is related to a virus or to allergies, the treatment is the same.  Please read below to learn more about the medications, dosages and frequencies that I recommend to help alleviate your symptoms and to get you feeling better soon:  Decadron IM (dexamethasone):  To quickly address your significant respiratory inflammation, you were provided with an injection of Decadron in the office today.  You should continue to feel the full benefit of the steroid for the next 12-24 hours.    Prednisone: This is a steroid that will significantly calm your upper and lower airways, please take the daily recommended quantity of tablets daily with your breakfast meal starting tomorrow morning until the prescription is complete.      Claritin (loratadine): This is an excellent second-generation antihistamine that helps to reduce respiratory inflammatory response to environmental allergens.  This medication is not known to cause daytime sleepiness so it can be taken in the daytime.  If you find that it does make you sleepy, please feel free to take it at bedtime.   Nasonex (mometasone): This is a steroid nasal spray that you use once daily, 2 sprays in each nare.  This medication does not work well if you decide to use it only used as you feel you need to, it works best used on a daily basis.  After 3 to 5 days of use, you will notice significant reduction of the inflammation and mucus production that is currently being caused by exposure to allergens, whether seasonal or environmental.  The most common side effect of this medication is nosebleeds.  If you experience a nosebleed, please discontinue use for 1 week, then feel free to resume.  I have provided you with a prescription.     Atrovent (ipratropium): This is an excellent nasal decongestant spray I have added to your  recommended nasal steroid that will not cause rebound congestion, please instill 2 sprays into each nare with each use.  Because nasal steroids can take several days before they begin to provide full benefit, I recommend that you use this spray in addition to the nasal steroid prescribed for you.  Please use it after you have used your nasal steroid and repeat up to 4 times daily as needed.  I have provided you with a prescription for this medication.      Robitussin, Mucinex (guaifenesin): This is an expectorant.  This helps break up chest congestion and loosen up thick nasal drainage making phlegm and drainage more liquid and therefore easier to remove.  I recommend being 400 mg three times daily as needed.      Promethazine DM: Promethazine is both a nasal decongestant and an antinausea medication that makes most patients feel fairly sleepy.  The DM is dextromethorphan, a cough suppressant found in many over-the-counter cough medications.  Please take 5 mL before bedtime to minimize your cough which will help you sleep better.  I have sent a prescription for this medication to your pharmacy.   If you find that you have not had improvement of your symptoms in the next 5 to 7 days, please follow-up with your primary care provider or return here to urgent care for repeat evaluation and further recommendations.         Thank you for visiting urgent care today.  We appreciate the opportunity to participate in your  care.

## 2022-11-19 ENCOUNTER — Encounter (HOSPITAL_COMMUNITY): Payer: Self-pay

## 2022-11-19 ENCOUNTER — Other Ambulatory Visit: Payer: Self-pay

## 2022-11-19 ENCOUNTER — Ambulatory Visit (INDEPENDENT_AMBULATORY_CARE_PROVIDER_SITE_OTHER): Payer: BC Managed Care – PPO

## 2022-11-19 ENCOUNTER — Ambulatory Visit (HOSPITAL_COMMUNITY)
Admission: RE | Admit: 2022-11-19 | Discharge: 2022-11-19 | Disposition: A | Discharge status: Home / Self Care | Payer: BC Managed Care – PPO | Source: Ambulatory Visit | Attending: Emergency Medicine | Admitting: Emergency Medicine

## 2022-11-19 VITALS — BP 166/102 | HR 94 | Temp 98.3°F | Resp 22

## 2022-11-19 DIAGNOSIS — R059 Cough, unspecified: Secondary | ICD-10-CM | POA: Diagnosis not present

## 2022-11-19 DIAGNOSIS — R5383 Other fatigue: Secondary | ICD-10-CM

## 2022-11-19 DIAGNOSIS — R42 Dizziness and giddiness: Secondary | ICD-10-CM

## 2022-11-19 DIAGNOSIS — J209 Acute bronchitis, unspecified: Secondary | ICD-10-CM

## 2022-11-19 MED ORDER — ALBUTEROL SULFATE (2.5 MG/3ML) 0.083% IN NEBU
2.5000 mg | INHALATION_SOLUTION | Freq: Once | RESPIRATORY_TRACT | Status: AC
  Administered 2022-11-19: 2.5 mg via RESPIRATORY_TRACT

## 2022-11-19 MED ORDER — PREDNISONE 10 MG (21) PO TBPK: ORAL_TABLET | Freq: Every day | ORAL | 0 refills | Status: DC

## 2022-11-19 MED ORDER — ALBUTEROL SULFATE (2.5 MG/3ML) 0.083% IN NEBU
INHALATION_SOLUTION | RESPIRATORY_TRACT | Status: AC
  Filled 2022-11-19: qty 3

## 2022-11-19 NOTE — ED Triage Notes (Signed)
Patient was diagnosed with sinusitis and started Augmentin around 11/05/2022.   Continues to have cough, left ear pain, back pain, dizziness, headache and fatigue.  Reports oxygen sats at home are around 92.  Complains of feeling congestion in throat.  Patient was seen 11/14/2022 at [facility] .  States has finished steroids.  Reports she cannot tell any difference after taking medicines with the exception of albuterol.  Albuterol has helped with wheezing.  Reports coughing up clear phlegm, rarely a speck of yellow color to phlegm  Patient did NOT have an xray while at previous visit.

## 2022-11-19 NOTE — Discharge Instructions (Addendum)
Use your albuterol inhaler 2 puffs every 4 hours using the spacer. Use one puff, wait a minute or so, then do the 2nd puff.   Try calling your primary care office and getting an appointment to be rechecked in a week. Get help sooner if you start feeling worse.

## 2022-11-19 NOTE — ED Provider Notes (Signed)
[place]    CSN: [id number] Arrival date & time: 11/19/22  0841      History   Chief Complaint Chief Complaint  Patient presents with   Cough   Appointment    9:00    HPI Julia Zuniga is a 61 y.o. female. Patient was diagnosed with sinusitis and started Augmentin around 11/05/2022.    Continues to have cough, left ear pain, back pain, dizziness, headache and fatigue.  Reports oxygen sats at home are around 92.  Complains of feeling congestion in throat.  Patient was seen 11/14/2022 at [facility] .  States has finished steroids.  Reports she cannot tell any difference after taking medicines with the exception of albuterol.  Albuterol has helped with wheezing.  Reports coughing up clear phlegm, rarely a speck of yellow color to phlegm  Feels albuterol helps some but only last for about 2 hours.  She is using it about 3 times a day.  Also complains of middle left back pain, she is not sure if it is from the pneumonia or if it is some pain from coughing so much.  Also still has pressure in her ears but it is better than it was when she was seen in urgent care 12/6.   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   History of radiation therapy 05/13/13-06/18/13   right breast, 5000 cGy 25 sessions   Hot flashes    Hyperlipidemia    Hypertension    Personal history of chemotherapy    Personal history of radiation therapy    PONV (postoperative nausea and vomiting)     Patient Active Problem List   Diagnosis Date Noted   Pre-diabetes 05/20/2019   Lateral epicondylitis of left elbow 07/15/2018   GAD (generalized anxiety disorder) 05/22/2017   Family history of stomach cancer- dad died age 15 12-31-2016   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 11/14/2016   BMI 38.0-38.9,adult 11/14/2016   S/P chemotherapy, time since greater than 12 weeks 11/14/2016   Heel pain 07/05/2016   History of Iron deficiency anemia- 2nd chemo induced Fe Def 05/10/2016   Fatigue 08/31/2015   GERD (gastroesophageal reflux disease) 08/11/2014   Anxiety 05/29/2014   Essential hypertension-  dx by [facility] Jan '14 01/07/2013   Hyperlipidemia- dx by [facility] Jan '14 01/07/2013   Malignant neoplasm of upper-outer quadrant of right breast in female, estrogen receptor negative ([place]) 12/12/2012    Past Surgical History:  Procedure Laterality Date   BREAST BIOPSY     BREAST LUMPECTOMY Right 12/31/2012   BREAST LUMPECTOMY WITH SENTINEL LYMPH NODE BIOPSY  12/31/2012   Procedure: BREAST LUMPECTOMY WITH SENTINEL LYMPH NODE BX;  Surgeon: Stark Klein, MD;  Location: [facility];  Service: General;  Laterality: Right;   CESAREAN SECTION     x 3   CHOLECYSTECTOMY  2001   lap choli   PORT-A-CATH REMOVAL Left 02/24/2014   Procedure: REMOVAL PORT-A-CATH;  Surgeon: Stark Klein, MD;  Location: [facility];  Service: General;  Laterality: Left;   PORTACATH PLACEMENT  12/31/2012   Procedure: INSERTION PORT-A-CATH;  Surgeon: Stark Klein, MD;  Location: [facility];  Service: General;  Laterality: Left;  Left Subclavian Vein   RE-EXCISION OF BREAST CANCER,SUPERIOR MARGINS  01/14/2013   Procedure: RE-EXCISION OF BREAST CANCER,SUPERIOR MARGINS;  Surgeon: Stark Klein, MD;  Location: [facility];  Service: General;  Laterality: Right;  right breast re excision for markers    TONSILLECTOMY AND ADENOIDECTOMY     TUBAL LIGATION      OB History   No obstetric history on file.      Home Medications    Prior to Admission medications   Medication Sig Start Date End Date Taking? Authorizing Provider  predniSONE (STERAPRED UNI-PAK 21 TAB) 10 MG (21) TBPK tablet Take by mouth daily. Take 6 tabs by mouth daily  for 2  days, then 5 tabs for 2 days, then 4 tabs for 2 days, then 3 tabs for 2 days, 2 tabs for 2 days, then 1 tab by mouth daily for 2 days 11/19/22  Yes Bijal Siglin, Dionne Bucy, NP  albuterol (VENTOLIN HFA) 108 (90 Base) MCG/ACT inhaler Inhale 2 puffs into the lungs every 6 (six) hours as needed for wheezing or shortness of breath (Cough). 11/14/22   Lynden Oxford Scales, PA-C  carvedilol (COREG) 12.5 MG tablet TAKE 1 TABLET BY MOUTH 2 TIMES DAILY WITH A MEAL. 07/04/22   Abonza, Maritza, PA-C  cyclobenzaprine (FLEXERIL) 10 MG tablet TAKE 1 TABLET (10 MG TOTAL) BY MOUTH 3 (THREE) TIMES DAILY AS NEEDED FOR MUSCLE SPASMS. 08/03/22   Mcarthur Rossetti, MD  guaiFENesin (ROBITUSSIN) 100 MG/5ML liquid Take 10-20 mLs (200-400 mg total) by mouth every 6 (six) hours as needed for cough or to loosen phlegm. 11/14/22   Lynden Oxford Scales, PA-C  HYDROcodone-acetaminophen (NORCO) 7.5-325 MG tablet Take 1 tablet by mouth every 6 (six) hours as needed for moderate pain. Patient not taking: Reported on 11/19/2022 06/08/22   Mcarthur Rossetti, MD  ibuprofen (ADVIL,MOTRIN) 200 MG tablet Take 400 mg by mouth every 6 (six) hours as needed. Pain    [provider]  ipratropium (ATROVENT) 0.06 % nasal spray Place 2 sprays into both nostrils 3 (three) times daily. As needed for nasal congestion, runny nose 11/14/22   Lynden Oxford Scales, PA-C  loratadine (CLARITIN) 10 MG tablet Take 1 tablet (10 mg total) by mouth daily. 11/14/22 05/13/23  Lynden Oxford Scales, PA-C  LORazepam (ATIVAN) 0.5 MG tablet Take 1 tablet (0.5 mg total) by mouth daily as needed for anxiety (only as needed panic attacks). 07/11/21   Abonza, Maritza, PA-C  mometasone (NASONEX) 50 MCG/ACT nasal spray Place 2 sprays into the nose daily. 11/14/22 02/12/23  Lynden Oxford Scales, PA-C  Olopatadine HCl (PATADAY) 0.2 % SOLN Apply 1 drop to eye daily. 11/14/22   Lynden Oxford Scales, PA-C  omeprazole (PRILOSEC) 20 MG capsule TAKE 1 CAPSULE BY MOUTH 2  TIMES DAILY BEFORE A MEAL. 07/04/22   Lorrene Reid, PA-C  promethazine-dextromethorphan (PROMETHAZINE-DM) 6.25-15 MG/5ML syrup Take 5 mLs by mouth at bedtime as needed for cough. 11/14/22   Lynden Oxford Scales, PA-C  rosuvastatin (CRESTOR) 5 MG tablet TAKE 1 TABLET BY MOUTH NIGHTLY BEFORE BED. 07/02/22   Lorrene Reid, PA-C    Family History Family History  Problem Relation Age of Onset   Stomach cancer Father    Lung cancer Maternal Uncle    Skin cancer Maternal Grandmother    Lung cancer Maternal Grandfather    Lymphoma Cousin    Colon cancer Neg Hx    Esophageal cancer Neg Hx    Rectal cancer Neg Hx  Social History Social History   Tobacco Use   Smoking status: Never   Smokeless tobacco: Never  Vaping Use   Vaping Use: Never used  Substance Use Topics   Alcohol use: Yes    Alcohol/week: 0.0 standard drinks of alcohol    Comment: occ   Drug use: No     Allergies   Amlodipine, Chlorhexidine, Codeine, Medralone [methylprednisolone acetate], and Oxycodone-acetaminophen   Review of Systems Review of Systems  Respiratory:  Positive for cough.      Physical Exam Triage Vital Signs ED Triage Vitals  Enc Vitals Group     BP 11/19/22 0905 (!) 166/102     Pulse Rate 11/19/22 0905 94     Resp 11/19/22 0905 (!) 22     Temp 11/19/22 0905 98.3 F (36.8 C)     Temp Source 11/19/22 0905 Oral     SpO2 11/19/22 0905 96 %     Weight --      Height --      Head Circumference --      Peak Flow --      Pain Score 11/19/22 0902 6     Pain Loc --      Pain Edu? --      Excl. in [place]? --    No data found.  Updated Vital Signs BP (!) 166/102 (BP Location: Left Arm)   Pulse 94   Temp 98.3 F (36.8 C) (Oral)   Resp (!) 22   LMP 01/07/2009   SpO2 96%   Visual Acuity Right Eye Distance:   Left Eye Distance:   Bilateral Distance:    Right Eye Near:   Left Eye Near:    Bilateral Near:     Physical Exam HENT:     Right Ear: Tympanic membrane, ear canal and  external ear normal.     Left Ear: Tympanic membrane, ear canal and external ear normal.     Nose: No congestion.     Mouth/Throat:     Mouth: Mucous membranes are moist.     Pharynx: Oropharynx is clear. No oropharyngeal exudate or posterior oropharyngeal erythema.  Cardiovascular:     Rate and Rhythm: Normal rate and regular rhythm.  Pulmonary:     Effort: Pulmonary effort is normal.     Breath sounds: Wheezing and rhonchi present.     Comments: Frequent coughing periods.      UC Treatments / Results  Labs (all labs ordered are listed, but only abnormal results are displayed) Labs Reviewed - No data to display  EKG   Radiology DG Chest 2 View  Result Date: 11/19/2022 CLINICAL DATA:  Persistent cough, fatigue, dizziness EXAM: CHEST - 2 VIEW COMPARISON:  12/31/2012 FINDINGS: Normal heart size. Prominent vascularity with diffuse symmetric bilateral interstitial prominence, nonspecific for interstitial edema pattern or chronic lung disease. No focal collapse or consolidation. Negative for edema or effusion. Trachea midline. Aorta atherosclerotic. Postop changes in the right breast and axilla. No acute osseous finding. IMPRESSION: Diffuse interstitial prominence, nonspecific for interstitial edema versus chronic lung disease. Electronically Signed   By: Jerilynn Mages.  Shick M.D.   On: 11/19/2022 09:52    Procedures Procedures (including critical care time)  Medications Ordered in UC Medications  albuterol (PROVENTIL) (2.5 MG/3ML) 0.083% nebulizer solution 2.5 mg (2.5 mg Nebulization Given 11/19/22 0945)  albuterol (PROVENTIL) (2.5 MG/3ML) 0.083% nebulizer solution 2.5 mg (2.5 mg Nebulization Given 11/19/22 1021)    Initial Impression / Assessment and Plan / UC Course  I  have reviewed the triage vital signs and the nursing notes.  Pertinent labs & imaging results that were available during my care of the patient were reviewed by me and considered in my medical decision making (see chart  for details).    Coughing improved after albuterol treatment x2. Rhonchi significantly improved after albuterol, pt still has some faint wheezing.   Discussed home care to include increased albuterol use, rx longer course of prednisone. Taught open glottis coughing. Discussed use of saline irrigation/neti pot for ear pressure. Pt to continue nasal sprays. To see f/u with PCP at [facility] in 1 week. REviewed reasons for seeking ER care.   Final Clinical Impressions(s) / UC Diagnoses   Final diagnoses:  Acute bronchitis, unspecified organism     Discharge Instructions      Use your albuterol inhaler 2 puffs every 4 hours using the spacer. Use one puff, wait a minute or so, then do the 2nd puff.   Try calling your primary care office and getting an appointment to be rechecked in a week. Get help sooner if you start feeling worse.      ED Prescriptions     Medication Sig Dispense Auth. Provider   predniSONE (STERAPRED UNI-PAK 21 TAB) 10 MG (21) TBPK tablet Take by mouth daily. Take 6 tabs by mouth daily  for 2 days, then 5 tabs for 2 days, then 4 tabs for 2 days, then 3 tabs for 2 days, 2 tabs for 2 days, then 1 tab by mouth daily for 2 days 42 tablet Avan Gullett, Dionne Bucy, NP      PDMP not reviewed this encounter.   Carvel Getting, NP 11/19/22 1102

## 2022-11-27 ENCOUNTER — Ambulatory Visit (INDEPENDENT_AMBULATORY_CARE_PROVIDER_SITE_OTHER): Payer: BC Managed Care – PPO | Admitting: Nurse Practitioner

## 2022-11-27 ENCOUNTER — Encounter: Payer: Self-pay | Admitting: Nurse Practitioner

## 2022-11-27 VITALS — BP 154/93 | HR 101 | Resp 20 | Ht 66.0 in | Wt 246.0 lb

## 2022-11-27 DIAGNOSIS — R051 Acute cough: Secondary | ICD-10-CM

## 2022-11-27 DIAGNOSIS — J189 Pneumonia, unspecified organism: Secondary | ICD-10-CM

## 2022-11-27 MED ORDER — CEFUROXIME AXETIL 500 MG PO TABS: 500.0000 mg | ORAL_TABLET | Freq: Two times a day (BID) | ORAL | 0 refills | Status: DC

## 2022-11-27 MED ORDER — PREDNISONE 10 MG (21) PO TBPK: ORAL_TABLET | Freq: Every day | ORAL | 0 refills | Status: DC

## 2022-11-27 NOTE — Progress Notes (Signed)
Established patient visit   Patient: Julia Zuniga   DOB: December 21, 1960   61 y.o. Female  MRN: [id number] Visit Date: 11/27/2022  Chief Complaint  Patient presents with   URI   Subjective    HPI  Follow up from urgent care visits  -seen twice  --initially given steroid taper and albuterol rescue inhaler  --feeling some better.  --persistent cough  --chest x-ray done at 2nd visit showed diffuse  interstitial prominence, possibly interstitial edema or chronic lung disease.  -no new medications were prescribed at 2nd visit.  --continues to have cough. Dry and harsh --continues to have some wheezing.  -feels achy and fatigued.   Medications: Outpatient Medications Prior to Visit  Medication Sig   albuterol (VENTOLIN HFA) 108 (90 Base) MCG/ACT inhaler Inhale 2 puffs into the lungs every 6 (six) hours as needed for wheezing or shortness of breath (Cough).   carvedilol (COREG) 12.5 MG tablet TAKE 1 TABLET BY MOUTH 2 TIMES DAILY WITH A MEAL.   cyclobenzaprine (FLEXERIL) 10 MG tablet TAKE 1 TABLET (10 MG TOTAL) BY MOUTH 3 (THREE) TIMES DAILY AS NEEDED FOR MUSCLE SPASMS.   HYDROcodone-acetaminophen (NORCO) 7.5-325 MG tablet Take 1 tablet by mouth every 6 (six) hours as needed for moderate pain.   ibuprofen (ADVIL,MOTRIN) 200 MG tablet Take 400 mg by mouth every 6 (six) hours as needed. Pain   loratadine (CLARITIN) 10 MG tablet Take 1 tablet (10 mg total) by mouth daily.   LORazepam (ATIVAN) 0.5 MG tablet Take 1 tablet (0.5 mg total) by mouth daily as needed for anxiety (only as needed panic attacks).   mometasone (NASONEX) 50 MCG/ACT nasal spray Place 2 sprays into the nose daily.   Olopatadine HCl (PATADAY) 0.2 % SOLN Apply 1 drop to eye daily.   omeprazole (PRILOSEC) 20 MG capsule TAKE 1 CAPSULE BY MOUTH 2 TIMES DAILY BEFORE A MEAL.   rosuvastatin (CRESTOR) 5 MG tablet TAKE 1 TABLET BY MOUTH NIGHTLY BEFORE BED.   [DISCONTINUED] predniSONE (STERAPRED UNI-PAK 21 TAB) 10 MG (21) TBPK tablet  Take by mouth daily. Take 6 tabs by mouth daily  for 2 days, then 5 tabs for 2 days, then 4 tabs for 2 days, then 3 tabs for 2 days, 2 tabs for 2 days, then 1 tab by mouth daily for 2 days   guaiFENesin (ROBITUSSIN) 100 MG/5ML liquid Take 10-20 mLs (200-400 mg total) by mouth every 6 (six) hours as needed for cough or to loosen phlegm.   ipratropium (ATROVENT) 0.06 % nasal spray Place 2 sprays into both nostrils 3 (three) times daily. As needed for nasal congestion, runny nose   promethazine-dextromethorphan (PROMETHAZINE-DM) 6.25-15 MG/5ML syrup Take 5 mLs by mouth at bedtime as needed for cough.   No facility-administered medications prior to visit.    Review of Systems  Constitutional:  Positive for fatigue. Negative for activity change, appetite change, chills and fever.  HENT:  Positive for congestion, postnasal drip and rhinorrhea. Negative for sinus pressure, sinus pain, sneezing and sore throat.   Eyes: Negative.   Respiratory:  Positive for cough and wheezing. Negative for chest tightness and shortness of breath.   Cardiovascular:  Negative for chest pain and palpitations.  Gastrointestinal:  Negative for abdominal pain, constipation, diarrhea, nausea and vomiting.  Endocrine: Negative for cold intolerance, heat intolerance, polydipsia and polyuria.  Genitourinary:  Negative for dyspareunia, dysuria, flank pain, frequency and urgency.  Musculoskeletal:  Positive for arthralgias, back pain and myalgias.  Skin:  Negative for rash.  Allergic/Immunologic: Negative for environmental allergies.  Neurological:  Positive for headaches. Negative for dizziness and weakness.  Hematological:  Negative for adenopathy.  Psychiatric/Behavioral:  The patient is not nervous/anxious.      Objective     Today's Vitals   11/27/22 1616  BP: (Abnormal) 154/93  Pulse: (Abnormal) 101  Resp: 20  SpO2: 95%  Weight: 246 lb (111.6 kg)  Height: '5\' 6"'$  (1.676 m)   Body mass index is 39.71 kg/m.    Physical Exam Vitals and nursing note reviewed.  Constitutional:      Appearance: Normal appearance. She is well-developed. She is ill-appearing.  HENT:     Head: Normocephalic and atraumatic.     Nose: Congestion present.     Right Turbinates: Swollen.     Left Turbinates: Swollen.     Right Sinus: Maxillary sinus tenderness and frontal sinus tenderness present.     Left Sinus: Maxillary sinus tenderness and frontal sinus tenderness present.     Mouth/Throat:     Pharynx: Posterior oropharyngeal erythema present.  Eyes:     Pupils: Pupils are equal, round, and reactive to light.  Cardiovascular:     Rate and Rhythm: Normal rate and regular rhythm.     Pulses: Normal pulses.     Heart sounds: Normal heart sounds.  Pulmonary:     Effort: Pulmonary effort is normal.     Breath sounds: Wheezing present.     Comments: Dry, harsh, non productive cough noted  Abdominal:     Palpations: Abdomen is soft.  Musculoskeletal:        General: Normal range of motion.     Cervical back: Normal range of motion and neck supple.  Lymphadenopathy:     Cervical: Cervical adenopathy present.  Skin:    General: Skin is warm and dry.     Capillary Refill: Capillary refill takes less than 2 seconds.  Neurological:     General: No focal deficit present.     Mental Status: She is alert and oriented to person, place, and time.  Psychiatric:        Mood and Affect: Mood normal.        Behavior: Behavior normal.        Thought Content: Thought content normal.        Judgment: Judgment normal.      Assessment & Plan     1. Walking pneumonia Start ceftin  mg twice daily for next 7 days. Add prednisone taper. Take as directed for 6 days. Rest and increase fluids. Continue using OTC medication to control symptoms.   - predniSONE (STERAPRED UNI-PAK 21 TAB) 10 MG (21) TBPK tablet; Take by mouth daily. Take 6 tabs by mouth daily  for 2 days, then 5 tabs for 2 days, then 4 tabs for 2 days, then 3 tabs  for 2 days, 2 tabs for 2 days, then 1 tab by mouth daily for 2 days  Dispense: 42 tablet; Refill: 0 - cefUROXime (CEFTIN) 500 MG tablet; Take 1 tablet (500 mg total) by mouth 2 (two) times daily with a meal.  Dispense: 14 tablet; Refill: 0  2. Acute cough Start ceftin  mg twice daily for next 7 days. Add prednisone taper. Take as directed for 6 days. Rest and increase fluids. Continue using OTC medication to control symptoms.  Reassess in one week.  - predniSONE (STERAPRED UNI-PAK 21 TAB) 10 MG (21) TBPK tablet; Take by mouth daily. Take 6 tabs by mouth daily  for 2  days, then 5 tabs for 2 days, then 4 tabs for 2 days, then 3 tabs for 2 days, 2 tabs for 2 days, then 1 tab by mouth daily for 2 days  Dispense: 42 tablet; Refill: 0 - cefUROXime (CEFTIN) 500 MG tablet; Take 1 tablet (500 mg total) by mouth 2 (two) times daily with a meal.  Dispense: 14 tablet; Refill: 0   Return in about 1 week (around 12/04/2022) for check up on probable pneumonia .        Ronnell Freshwater, NP  [facility] at [facility] [phone] (phone) [phone] (fax)  [place]

## 2022-12-04 ENCOUNTER — Ambulatory Visit (INDEPENDENT_AMBULATORY_CARE_PROVIDER_SITE_OTHER): Payer: BC Managed Care – PPO | Admitting: Nurse Practitioner

## 2022-12-04 VITALS — BP 149/83 | HR 94 | Resp 20 | Ht 66.0 in | Wt 249.0 lb

## 2022-12-04 DIAGNOSIS — J189 Pneumonia, unspecified organism: Secondary | ICD-10-CM | POA: Diagnosis not present

## 2022-12-04 NOTE — Progress Notes (Signed)
Today's Vitals   12/04/22 1310  BP: (Abnormal) 149/83  Pulse: 94  Resp: 20  SpO2: 94%  Weight: 249 lb (112.9 kg)  Height: '5\' 6"'$  (1.676 m)   Body mass index is 40.19 kg/m.   Breath sounds are clear to auscultation bilaterally. Continues to have mild, congested cough with vasomotor rhinitis.   1. Walking pneumonia Improving. Has one day left of antibiotics and a few days left of steroids. Will reassess need for ENT referral at next visit in 01/2023.

## 2022-12-27 ENCOUNTER — Other Ambulatory Visit: Payer: Self-pay | Admitting: Nurse Practitioner

## 2022-12-27 DIAGNOSIS — E785 Hyperlipidemia, unspecified: Secondary | ICD-10-CM

## 2022-12-31 ENCOUNTER — Other Ambulatory Visit: Payer: Self-pay | Admitting: Nurse Practitioner

## 2022-12-31 DIAGNOSIS — K2100 Gastro-esophageal reflux disease with esophagitis, without bleeding: Secondary | ICD-10-CM

## 2022-12-31 DIAGNOSIS — I10 Essential (primary) hypertension: Secondary | ICD-10-CM

## 2023-01-30 ENCOUNTER — Other Ambulatory Visit: Payer: Self-pay | Admitting: Nurse Practitioner

## 2023-01-30 DIAGNOSIS — R0982 Postnasal drip: Secondary | ICD-10-CM | POA: Diagnosis not present

## 2023-01-30 DIAGNOSIS — J329 Chronic sinusitis, unspecified: Secondary | ICD-10-CM | POA: Diagnosis not present

## 2023-01-30 DIAGNOSIS — K2100 Gastro-esophageal reflux disease with esophagitis, without bleeding: Secondary | ICD-10-CM

## 2023-01-30 DIAGNOSIS — I10 Essential (primary) hypertension: Secondary | ICD-10-CM

## 2023-01-31 ENCOUNTER — Encounter: Payer: Self-pay | Admitting: Family Medicine

## 2023-01-31 ENCOUNTER — Ambulatory Visit (INDEPENDENT_AMBULATORY_CARE_PROVIDER_SITE_OTHER): Payer: BC Managed Care – PPO | Admitting: Family Medicine

## 2023-01-31 VITALS — BP 144/90 | HR 80 | Resp 18 | Ht 66.0 in | Wt 244.0 lb

## 2023-01-31 DIAGNOSIS — Z0000 Encounter for general adult medical examination without abnormal findings: Secondary | ICD-10-CM

## 2023-01-31 DIAGNOSIS — R053 Chronic cough: Secondary | ICD-10-CM | POA: Diagnosis not present

## 2023-01-31 DIAGNOSIS — Z6838 Body mass index (BMI) 38.0-38.9, adult: Secondary | ICD-10-CM

## 2023-01-31 DIAGNOSIS — I10 Essential (primary) hypertension: Secondary | ICD-10-CM | POA: Diagnosis not present

## 2023-01-31 DIAGNOSIS — F411 Generalized anxiety disorder: Secondary | ICD-10-CM | POA: Diagnosis not present

## 2023-01-31 DIAGNOSIS — R058 Other specified cough: Secondary | ICD-10-CM

## 2023-01-31 DIAGNOSIS — R062 Wheezing: Secondary | ICD-10-CM

## 2023-01-31 DIAGNOSIS — E782 Mixed hyperlipidemia: Secondary | ICD-10-CM

## 2023-01-31 DIAGNOSIS — R7303 Prediabetes: Secondary | ICD-10-CM

## 2023-01-31 DIAGNOSIS — K219 Gastro-esophageal reflux disease without esophagitis: Secondary | ICD-10-CM

## 2023-01-31 DIAGNOSIS — Z853 Personal history of malignant neoplasm of breast: Secondary | ICD-10-CM

## 2023-01-31 NOTE — Assessment & Plan Note (Signed)
Checking A1c today.  Discussed diet and exercise.  Will discuss A1c results at follow-up appointment for weight management.

## 2023-01-31 NOTE — Assessment & Plan Note (Signed)
Stable.  Continue omeprazole.  Will continue to monitor.

## 2023-01-31 NOTE — Patient Instructions (Signed)
Weight loss tips and tricks:  1.  Make sure to drink at least 64 ounces of water every day. 2.  Avoid alcohol. 3.  Avoid eating within 3 hours of going to bed. 4.  Cut out sugary drinks such as sweet tea, regular sodas, energy drinks, etc. 5.  Make sure you are getting enough sleep every night. 6.  Start making changes by cutting back portion sizes by 1/3. 7.  Keep a food diary to help identify areas for improvement and promote awareness of bad habits. 8.  Increase your activity.  Choose something you like to do that is fun for you so you are more likely to stick with it. 9.  Do not forget your protein! 10.  Measure your neck, upper arms, waist, hips, and thighs and write those measurements down somewhere before you start your weight loss journey.  Changes in your measurements will tell a far more accurate story than the number on a scale. 11.  As you go, pay attention to how your clothes fit! 12.  Weigh yourself as often or as little as you need to.  Some folks do better weighing every day while others do better with once a week. 13.  Do not get discouraged!  Weight loss efforts are meant to be lifestyle changes.  Once you stop a medication (if you are taking 1), your behaviors and habits will determine if you maintain your weight loss or not.  For those on medications:  1.  Take your medication as prescribed every day first thing in the morning. 2.  Common side effects include nausea and constipation.  To combat this, increased your daily water consumption.  Consider adding in a stool softener (available OTC) if needed.  Also increase fiber intake with vegetables and fruits. 3.  If you have any side effects or concerns while taking medication, please do not hesitate to reach out to [place] here at the office. 4.  While on weight loss medications, we do require you to follow-up every 4 weeks with an in office visit.  Refills will not be called in early on controlled substances.  Good luck on your  weight loss journey!  Have faith in your self and you will reach your goals!

## 2023-01-31 NOTE — Assessment & Plan Note (Addendum)
Blood pressure elevated upon presentation at 152/90, remained elevated by the end of the visit 144/90.  When checking at home it is within an acceptable range but is usually elevated in the office.  We discussed the importance of checking blood pressure at home to ensure that it is controlled and she will let me know if it is higher than 130/80.  We also discussed the possibility that it is related to her current acute illness.  Continue current regimen and check blood pressure at home.  Will continue to monitor.

## 2023-01-31 NOTE — Assessment & Plan Note (Signed)
Once she starts feeling better she would like to get back into a routine of swimming and walking.  We discussed that there are options to support weight management.  Will follow-up in 2 months to check in to see how it is going and discussed if there is need to add medication to the regimen to support her goals.

## 2023-01-31 NOTE — Assessment & Plan Note (Signed)
Stable.  Continue current regimen.  Will continue to monitor.

## 2023-01-31 NOTE — Assessment & Plan Note (Signed)
Checking lipid panel today.  Continue rosuvastatin unless lab results indicate otherwise.  Will continue to monitor.

## 2023-01-31 NOTE — Progress Notes (Signed)
Complete physical exam  Patient: Julia Zuniga   DOB: 06-09-1961   62 y.o. Female  MRN: [id number]  Subjective:    Chief Complaint  Patient presents with   Health Maintenance    Fasting    Julia Zuniga is a 62 y.o. female who presents today for a complete physical exam. She reports consuming a general diet. The patient does not participate in regular exercise at present.  She would like to get back to swimming and walking.  She generally feels poorly. She reports sleeping poorly.  She feels and sleeps poorly due to an ongoing cough that initially began 2 or 3 months ago.  She was seen here by Nira Conn and we did a course of antibiotics and prednisone with no relief.  She endorses that she continues to have a productive cough, chest congestion, nasal congestion and rhinorrhea.  She was evaluated at ENT this week with no significant findings.  She has no history of asthma, no history of smoking, no recent travel, no known exposures to tuberculosis or contacts with similar symptoms.  She was also seen at urgent care a few weeks ago and they did not have any significant findings.  Most recent fall risk assessment:    01/31/2023    8:56 AM  Fall Risk   Falls in the past year? 0  Number falls in past yr: 0  Injury with Fall? 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 11/14/2016   BMI 38.0-38.9,adult 11/14/2016   S/P chemotherapy, time since greater than 12 weeks 11/14/2016   Heel pain 07/05/2016   History of Iron deficiency anemia- 2nd chemo induced Fe Def 05/10/2016   Fatigue  08/31/2015   GERD (gastroesophageal reflux disease) 08/11/2014   Generalized anxiety disorder 05/29/2014   Essential hypertension 01/07/2013   Hyperlipidemia 01/07/2013   Malignant neoplasm of upper-outer quadrant of right breast in female, estrogen receptor negative ([place]) 12/12/2012   Past Surgical History:  Procedure Laterality Date   BREAST BIOPSY     BREAST LUMPECTOMY Right 12/31/2012   BREAST LUMPECTOMY WITH SENTINEL LYMPH NODE BIOPSY  12/31/2012   Procedure: BREAST LUMPECTOMY WITH SENTINEL LYMPH NODE BX;  Surgeon: Stark Klein, MD;  Location: [facility];  Service: General;  Laterality: Right;   CESAREAN SECTION     x 3   CHOLECYSTECTOMY  2001   lap choli   PORT-A-CATH REMOVAL Left 02/24/2014   Procedure: REMOVAL PORT-A-CATH;  Surgeon: Stark Klein, MD;  Location: [facility];  Service: General;  Laterality: Left;   PORTACATH PLACEMENT  12/31/2012   Procedure: INSERTION PORT-A-CATH;  Surgeon: Stark Klein, MD;  Location: [facility];  Service: General;  Laterality: Left;  Left Subclavian Vein   RE-EXCISION OF BREAST CANCER,SUPERIOR MARGINS  01/14/2013   Procedure: RE-EXCISION OF BREAST CANCER,SUPERIOR MARGINS;  Surgeon: Stark Klein, MD;  Location: [facility];  Service: General;  Laterality: Right;  right breast re excision for markers    TONSILLECTOMY AND ADENOIDECTOMY  TUBAL LIGATION     Social History   Tobacco Use   Smoking status: Never    Passive exposure: Never   Smokeless tobacco: Never  Vaping Use   Vaping Use: Never used  Substance Use Topics   Alcohol use: Yes    Alcohol/week: 0.0 standard drinks of alcohol    Comment: occ   Drug use: No   Family History  Problem Relation Age of Onset   Stomach cancer Father    Lung cancer Maternal Uncle    Skin cancer Maternal Grandmother    Lung cancer Maternal Grandfather    Lymphoma Cousin    Colon cancer Neg Hx    Esophageal cancer Neg Hx    Rectal cancer Neg Hx    Allergies   Allergen Reactions   Amlodipine     Dizziness, lightheaded, breathing issues    Chlorhexidine     Patient skin gets bright red with rash.       Codeine Swelling   Medralone [Methylprednisolone Acetate]     "coming out of skin"    Oxycodone-Acetaminophen Other (See Comments)    Face got red and hot      Patient Care Team: Lorrene Reid, PA-C (Inactive) as PCP - General Magrinat, Virgie Dad, MD (Inactive) as Consulting Physician (Oncology) Molli Posey, MD as Consulting Physician (Obstetrics and Gynecology)   Outpatient Medications Prior to Visit  Medication Sig   albuterol (VENTOLIN HFA) 108 (90 Base) MCG/ACT inhaler Inhale 2 puffs into the lungs every 6 (six) hours as needed for wheezing or shortness of breath (Cough).   Ascorbic Acid (VITAMIN C GUMMIE PO) Take 2 each by mouth daily.   B Complex-C (B-COMPLEX WITH VITAMIN C) tablet Take 1 tablet by mouth daily.   carvedilol (COREG) 12.5 MG tablet TAKE 1 TABLET BY MOUTH 2 TIMES DAILY WITH A MEAL.   Cholecalciferol (VITAMIN D) 50 MCG (2000 UT) CAPS Take 2 capsules by mouth daily.   cyclobenzaprine (FLEXERIL) 10 MG tablet TAKE 1 TABLET (10 MG TOTAL) BY MOUTH 3 (THREE) TIMES DAILY AS NEEDED FOR MUSCLE SPASMS.   ELDERBERRY PO Take 1 each by mouth daily.   HYDROcodone-acetaminophen (NORCO) 7.5-325 MG tablet Take 1 tablet by mouth every 6 (six) hours as needed for moderate pain.   ibuprofen (ADVIL,MOTRIN) 200 MG tablet Take 400 mg by mouth every 6 (six) hours as needed. Pain   ipratropium (ATROVENT) 0.06 % nasal spray Place 2 sprays into both nostrils 3 (three) times daily. As needed for nasal congestion, runny nose   LORazepam (ATIVAN) 0.5 MG tablet Take 1 tablet (0.5 mg total) by mouth daily as needed for anxiety (only as needed panic attacks).   omeprazole (PRILOSEC) 20 MG capsule TAKE 1 CAPSULE BY MOUTH 2 TIMES DAILY BEFORE A MEAL.   rosuvastatin (CRESTOR) 5 MG tablet TAKE 1 TABLET BY MOUTH NIGHTLY BEFORE BED.   [DISCONTINUED]  cefUROXime (CEFTIN) 500 MG tablet Take 1 tablet (500 mg total) by mouth 2 (two) times daily with a meal. (Patient not taking: Reported on 01/31/2023)   [DISCONTINUED] guaiFENesin (ROBITUSSIN) 100 MG/5ML liquid Take 10-20 mLs (200-400 mg total) by mouth every 6 (six) hours as needed for cough or to loosen phlegm. (Patient not taking: Reported on 01/31/2023)   [DISCONTINUED] loratadine (CLARITIN) 10 MG tablet Take 1 tablet (10 mg total) by mouth daily. (Patient not taking: Reported on 01/31/2023)   [DISCONTINUED] mometasone (NASONEX) 50 MCG/ACT nasal spray Place 2 sprays into the nose daily. (Patient not taking: Reported on 01/31/2023)   [  DISCONTINUED] Olopatadine HCl (PATADAY) 0.2 % SOLN Apply 1 drop to eye daily. (Patient not taking: Reported on 01/31/2023)   [DISCONTINUED] predniSONE (STERAPRED UNI-PAK 21 TAB) 10 MG (21) TBPK tablet Take by mouth daily. Take 6 tabs by mouth daily  for 2 days, then 5 tabs for 2 days, then 4 tabs for 2 days, then 3 tabs for 2 days, 2 tabs for 2 days, then 1 tab by mouth daily for 2 days (Patient not taking: Reported on 01/31/2023)   [DISCONTINUED] promethazine-dextromethorphan (PROMETHAZINE-DM) 6.25-15 MG/5ML syrup Take 5 mLs by mouth at bedtime as needed for cough. (Patient not taking: Reported on 01/31/2023)   No facility-administered medications prior to visit.    Review of Systems  Constitutional:  Negative for chills, fever and malaise/fatigue.  HENT:  Negative for congestion and hearing loss.   Eyes:  Negative for blurred vision and double vision.  Respiratory:  Positive for cough, sputum production and wheezing. Negative for hemoptysis and shortness of breath.   Cardiovascular:  Negative for chest pain, palpitations and leg swelling.  Gastrointestinal:  Negative for abdominal pain, constipation, diarrhea and heartburn.  Genitourinary:  Negative for frequency and urgency.  Musculoskeletal:  Negative for myalgias and neck pain.  Neurological:  Negative for  headaches.  Endo/Heme/Allergies:  Negative for polydipsia.  Psychiatric/Behavioral:  Negative for depression. The patient is not nervous/anxious.        Objective:    BP (!) 144/90 (BP Location: Left Arm, Patient Position: Sitting, Cuff Size: Large)   Pulse 80   Resp 18   Ht 5' 6"$  (1.676 m)   Wt 244 lb (110.7 kg)   LMP 01/07/2009   SpO2 93%   BMI 39.38 kg/m    Physical Exam Constitutional:      General: She is not in acute distress.    Appearance: Normal appearance.  HENT:     Head: Normocephalic and atraumatic.     Right Ear: Tympanic membrane and ear canal normal.     Left Ear: Tympanic membrane and ear canal normal.     Nose: Nose normal.     Mouth/Throat:     Mouth: Mucous membranes are moist.     Pharynx: No oropharyngeal exudate or posterior oropharyngeal erythema.  Eyes:     Extraocular Movements: Extraocular movements intact.     Pupils: Pupils are equal, round, and reactive to light.  Cardiovascular:     Rate and Rhythm: Normal rate and regular rhythm.     Heart sounds: Normal heart sounds.  Pulmonary:     Effort: Pulmonary effort is normal.     Breath sounds: Wheezing (Expiratory wheeze right lower lobe) present. No rhonchi or rales.  Abdominal:     General: Abdomen is flat. Bowel sounds are normal.  Musculoskeletal:        General: Normal range of motion.     Cervical back: Normal range of motion and neck supple.  Skin:    General: Skin is warm and dry.  Neurological:     Mental Status: She is alert and oriented to person, place, and time.  Psychiatric:        Mood and Affect: Mood normal.       Assessment & Plan:    Routine Health Maintenance and Physical Exam  Immunization History  Administered Date(s) Administered   Influenza,inj,Quad PF,6+ Mos 11/14/2016, 11/11/2017, 11/17/2018, 11/02/2019   Influenza-Unspecified 10/20/2020, 10/24/2021   PFIZER(Purple Top)SARS-COV-2 Vaccination 02/19/2020, 03/15/2020   Tdap 05/16/2011    Health  Maintenance  Topic  Date Due   Hepatitis C Screening  Never done   PAP SMEAR-Modifier  02/20/2020   COVID-19 Vaccine (3 - Pfizer risk series) 04/12/2020   DTaP/Tdap/Td (2 - Td or Tdap) 05/15/2021   INFLUENZA VACCINE  03/10/2023 (Originally 07/10/2022)   Zoster Vaccines- Shingrix (1 of 2) 05/01/2023 (Originally 09/04/1981)   HIV Screening  05/22/2027 (Originally 09/04/1977)   COLONOSCOPY (Pts 45-57yr Insurance coverage will need to be confirmed)  03/26/2024   MAMMOGRAM  05/15/2024   HPV VACCINES  Aged Out    Discussed health benefits of physical activity, and encouraged her to engage in regular exercise appropriate for her age and condition.  Wellness examination -     CBC with Differential/Platelet; Future -     Comprehensive metabolic panel; Future -     Hemoglobin A1c; Future -     Lipid panel; Future -     TSH; Future -     VITAMIN D 25 Hydroxy (Vit-D Deficiency, Fractures); Future  Prediabetes Assessment & Plan: Checking A1c today.  Discussed diet and exercise.  Will discuss A1c results at follow-up appointment for weight management.  Orders: -     CBC with Differential/Platelet; Future -     Comprehensive metabolic panel; Future -     Hemoglobin A1c; Future  Mixed hyperlipidemia Assessment & Plan: Checking lipid panel today.  Continue rosuvastatin unless lab results indicate otherwise.  Will continue to monitor.  Orders: -     Lipid panel; Future -     TSH; Future  Generalized anxiety disorder Assessment & Plan: Stable.  Continue current regimen.  Will continue to monitor.   BMI 38.0-38.9,adult Assessment & Plan: Once she starts feeling better she would like to get back into a routine of swimming and walking.  We discussed that there are options to support weight management.  Will follow-up in 2 months to check in to see how it is going and discussed if there is need to add medication to the regimen to support her goals.  Orders: -     VITAMIN D 25 Hydroxy (Vit-D  Deficiency, Fractures); Future  Gastroesophageal reflux disease, unspecified whether esophagitis present Assessment & Plan: Stable.  Continue omeprazole.  Will continue to monitor.   Essential hypertension Assessment & Plan: Blood pressure elevated upon presentation at 152/90, remained elevated by the end of the visit 144/90.  When checking at home it is within an acceptable range but is usually elevated in the office.  We discussed the importance of checking blood pressure at home to ensure that it is controlled and she will let me know if it is higher than 130/80.  We also discussed the possibility that it is related to her current acute illness.  Continue current regimen and check blood pressure at home.  Will continue to monitor.  Orders: -     CBC with Differential/Platelet; Future -     Comprehensive metabolic panel; Future  History of malignant neoplasm of breast Assessment & Plan: Continue annual mammogram   Chronic cough -     DG Chest 2 View; Future  Nocturnal cough with wheeze -     DG Chest 2 View; Future  Chest x-ray for ongoing cough.  Pending results for guidance as to next steps.  We may consider evaluation by pulmonology or treatment for acute illness depending on results.  Return in about 2 months (around 04/01/2023) for weight check in.    MVelva Harman PA

## 2023-01-31 NOTE — Assessment & Plan Note (Signed)
Continue annual mammogram

## 2023-02-01 LAB — CBC WITH DIFFERENTIAL/PLATELET
Basophils Absolute: 0.1 10*3/uL (ref 0.0–0.2)
Basos: 1 %
EOS (ABSOLUTE): 0.4 10*3/uL (ref 0.0–0.4)
Eos: 4 %
Hematocrit: 40 % (ref 34.0–46.6)
Hemoglobin: 12.6 g/dL (ref 11.1–15.9)
Immature Grans (Abs): 0 10*3/uL (ref 0.0–0.1)
Immature Granulocytes: 0 %
Lymphocytes Absolute: 3.2 10*3/uL — ABNORMAL HIGH (ref 0.7–3.1)
Lymphs: 33 %
MCH: 25.1 pg — ABNORMAL LOW (ref 26.6–33.0)
MCHC: 31.5 g/dL (ref 31.5–35.7)
MCV: 80 fL (ref 79–97)
Monocytes Absolute: 0.5 10*3/uL (ref 0.1–0.9)
Monocytes: 5 %
Neutrophils Absolute: 5.6 10*3/uL (ref 1.4–7.0)
Neutrophils: 57 %
Platelets: 262 10*3/uL (ref 150–450)
RBC: 5.01 x10E6/uL (ref 3.77–5.28)
RDW: 14.2 % (ref 11.7–15.4)
WBC: 9.7 10*3/uL (ref 3.4–10.8)

## 2023-02-01 LAB — COMPREHENSIVE METABOLIC PANEL
ALT: 23 IU/L (ref 0–32)
AST: 14 IU/L (ref 0–40)
Albumin/Globulin Ratio: 2 (ref 1.2–2.2)
Albumin: 4.9 g/dL (ref 3.8–4.9)
Alkaline Phosphatase: 119 IU/L (ref 44–121)
BUN/Creatinine Ratio: 18 (ref 12–28)
BUN: 9 mg/dL (ref 8–27)
Bilirubin Total: 0.4 mg/dL (ref 0.0–1.2)
CO2: 24 mmol/L (ref 20–29)
Calcium: 9.9 mg/dL (ref 8.7–10.3)
Chloride: 104 mmol/L (ref 96–106)
Creatinine, Ser: 0.51 mg/dL — ABNORMAL LOW (ref 0.57–1.00)
Globulin, Total: 2.5 g/dL (ref 1.5–4.5)
Glucose: 103 mg/dL — ABNORMAL HIGH (ref 70–99)
Potassium: 4.4 mmol/L (ref 3.5–5.2)
Sodium: 143 mmol/L (ref 134–144)
Total Protein: 7.4 g/dL (ref 6.0–8.5)
eGFR: 107 mL/min/{1.73_m2} (ref >59)

## 2023-02-01 LAB — LIPID PANEL
Chol/HDL Ratio: 4 ratio (ref 0.0–4.4)
Cholesterol, Total: 170 mg/dL (ref 100–199)
HDL: 43 mg/dL (ref >39)
LDL Chol Calc (NIH): 99 mg/dL (ref 0–99)
Triglycerides: 160 mg/dL — ABNORMAL HIGH (ref 0–149)
VLDL Cholesterol Cal: 28 mg/dL (ref 5–40)

## 2023-02-01 LAB — HEMOGLOBIN A1C
Est. average glucose Bld gHb Est-mCnc: 128 mg/dL
Hgb A1c MFr Bld: 6.1 % — ABNORMAL HIGH (ref 4.8–5.6)

## 2023-02-01 LAB — VITAMIN D 25 HYDROXY (VIT D DEFICIENCY, FRACTURES): Vit D, 25-Hydroxy: 46.1 ng/mL (ref 30.0–100.0)

## 2023-02-01 LAB — TSH: TSH: 1.26 u[IU]/mL (ref 0.450–4.500)

## 2023-02-11 ENCOUNTER — Encounter: Payer: Self-pay | Admitting: Family Medicine

## 2023-02-14 ENCOUNTER — Encounter: Payer: Self-pay | Admitting: Radiology

## 2023-02-20 ENCOUNTER — Encounter: Payer: Self-pay | Admitting: Family Medicine

## 2023-02-20 DIAGNOSIS — I10 Essential (primary) hypertension: Secondary | ICD-10-CM

## 2023-02-20 DIAGNOSIS — E785 Hyperlipidemia, unspecified: Secondary | ICD-10-CM

## 2023-02-20 MED ORDER — ALBUTEROL SULFATE HFA 108 (90 BASE) MCG/ACT IN AERS: 2.0000 | INHALATION_SPRAY | Freq: Four times a day (QID) | RESPIRATORY_TRACT | 2 refills | Status: DC | PRN

## 2023-02-20 MED ORDER — ROSUVASTATIN CALCIUM 5 MG PO TABS: ORAL_TABLET | ORAL | 1 refills | Status: DC

## 2023-02-20 MED ORDER — CARVEDILOL 12.5 MG PO TABS: 12.5000 mg | ORAL_TABLET | Freq: Two times a day (BID) | ORAL | 1 refills | Status: DC

## 2023-03-28 ENCOUNTER — Telehealth: Payer: BC Managed Care – PPO | Admitting: Family Medicine

## 2023-03-28 DIAGNOSIS — R051 Acute cough: Secondary | ICD-10-CM | POA: Diagnosis not present

## 2023-03-28 DIAGNOSIS — B9689 Other specified bacterial agents as the cause of diseases classified elsewhere: Secondary | ICD-10-CM

## 2023-03-28 DIAGNOSIS — J019 Acute sinusitis, unspecified: Secondary | ICD-10-CM | POA: Diagnosis not present

## 2023-03-28 MED ORDER — AMOXICILLIN-POT CLAVULANATE 875-125 MG PO TABS: 1.0000 | ORAL_TABLET | Freq: Two times a day (BID) | ORAL | 0 refills | Status: DC

## 2023-03-28 MED ORDER — BENZONATATE 100 MG PO CAPS: 100.0000 mg | ORAL_CAPSULE | Freq: Three times a day (TID) | ORAL | 0 refills | Status: DC | PRN

## 2023-03-28 NOTE — Addendum Note (Signed)
Addended by: Freddy Finner on: 03/28/2023 09:56 AM   Modules accepted: Orders

## 2023-03-28 NOTE — Addendum Note (Signed)
Addended by: Margaretann Loveless on: 03/28/2023 10:13 AM   Modules accepted: Orders

## 2023-03-28 NOTE — Progress Notes (Signed)

## 2023-04-01 ENCOUNTER — Ambulatory Visit: Payer: BC Managed Care – PPO | Admitting: Family Medicine

## 2023-04-02 ENCOUNTER — Other Ambulatory Visit: Payer: Self-pay | Admitting: Obstetrics and Gynecology

## 2023-04-02 DIAGNOSIS — Z1231 Encounter for screening mammogram for malignant neoplasm of breast: Secondary | ICD-10-CM

## 2023-05-21 ENCOUNTER — Ambulatory Visit
Admission: RE | Admit: 2023-05-21 | Discharge: 2023-05-21 | Disposition: A | Discharge status: Home / Self Care | Payer: BC Managed Care – PPO | Source: Ambulatory Visit | Attending: Obstetrics and Gynecology | Admitting: Obstetrics and Gynecology

## 2023-05-21 DIAGNOSIS — Z1231 Encounter for screening mammogram for malignant neoplasm of breast: Secondary | ICD-10-CM | POA: Diagnosis not present

## 2023-08-16 ENCOUNTER — Other Ambulatory Visit: Payer: Self-pay | Admitting: Family Medicine

## 2023-08-16 DIAGNOSIS — I10 Essential (primary) hypertension: Secondary | ICD-10-CM

## 2023-09-10 ENCOUNTER — Encounter: Payer: Self-pay | Admitting: Family Medicine

## 2023-09-10 DIAGNOSIS — I10 Essential (primary) hypertension: Secondary | ICD-10-CM

## 2023-09-10 DIAGNOSIS — E785 Hyperlipidemia, unspecified: Secondary | ICD-10-CM

## 2023-09-10 DIAGNOSIS — K2100 Gastro-esophageal reflux disease with esophagitis, without bleeding: Secondary | ICD-10-CM

## 2023-09-10 MED ORDER — OMEPRAZOLE 20 MG PO CPDR: 20.0000 mg | DELAYED_RELEASE_CAPSULE | Freq: Every day | ORAL | 0 refills | Status: DC

## 2023-09-10 MED ORDER — ROSUVASTATIN CALCIUM 5 MG PO TABS: ORAL_TABLET | ORAL | 0 refills | Status: DC

## 2023-09-10 MED ORDER — CARVEDILOL 12.5 MG PO TABS: 12.5000 mg | ORAL_TABLET | Freq: Two times a day (BID) | ORAL | 0 refills | Status: DC

## 2023-09-10 NOTE — Addendum Note (Signed)
Addended by: Tonny Bollman on: 09/10/2023 11:54 AM   Modules accepted: Orders

## 2023-09-10 NOTE — Telephone Encounter (Signed)
Pt called and made an appt for Julia Zuniga first available 10/18/23

## 2023-10-18 ENCOUNTER — Encounter: Payer: Self-pay | Admitting: Family Medicine

## 2023-10-18 ENCOUNTER — Ambulatory Visit: Payer: BC Managed Care – PPO | Admitting: Family Medicine

## 2023-10-18 VITALS — BP 130/79 | HR 80 | Resp 18 | Ht 66.0 in | Wt 236.0 lb

## 2023-10-18 DIAGNOSIS — E559 Vitamin D deficiency, unspecified: Secondary | ICD-10-CM

## 2023-10-18 DIAGNOSIS — I10 Essential (primary) hypertension: Secondary | ICD-10-CM | POA: Diagnosis not present

## 2023-10-18 DIAGNOSIS — Z1159 Encounter for screening for other viral diseases: Secondary | ICD-10-CM

## 2023-10-18 DIAGNOSIS — E782 Mixed hyperlipidemia: Secondary | ICD-10-CM

## 2023-10-18 DIAGNOSIS — R7303 Prediabetes: Secondary | ICD-10-CM

## 2023-10-18 MED ORDER — CARVEDILOL 12.5 MG PO TABS
12.5000 mg | ORAL_TABLET | Freq: Two times a day (BID) | ORAL | 1 refills | Status: DC
Start: 2023-10-18 — End: 2024-04-16

## 2023-10-18 MED ORDER — ROSUVASTATIN CALCIUM 5 MG PO TABS: ORAL_TABLET | ORAL | 1 refills | Status: DC

## 2023-10-18 NOTE — Assessment & Plan Note (Signed)
Last lipid panel: LDL 99, HDL 43, triglycerides 160.  Repeating lipid panel and hepatic function today.  Continue rosuvastatin 5 mg daily unless lab results warrant change.

## 2023-10-18 NOTE — Patient Instructions (Signed)
Enjoy your time with your grandbabies!  We will get a copy of your flu and tetanus shots once you get them next week :)

## 2023-10-18 NOTE — Progress Notes (Signed)
Established Patient Office Visit  Subjective   Patient ID: Julia Zuniga, female    DOB: Feb 21, 1961  Age: 62 y.o. MRN: [id number]  Chief Complaint  Patient presents with   Hypertension    HPI Julia Zuniga is a 62 y.o. female presenting today for follow up of hypertension, hyperlipidemia. Hypertension: Patient here for follow-up of elevated blood pressure. She is exercising and is adherent to low salt diet.   Pt denies chest pain, SOB, dizziness, edema, syncope, fatigue or heart palpitations. Taking carvedilol 12.5 mg twice daily, reports excellent compliance with treatment. Denies side effects. Hyperlipidemia: tolerating rosuvastatin 5 mg daily well with no myalgias or significant side effects. Currently using weight watchers and has lost about 8 pounds since her last appointment.   The 10-year ASCVD risk score (Arnett DK, et al., 2019) is: 6.3%   Outpatient Medications Prior to Visit  Medication Sig   albuterol (VENTOLIN HFA) 108 (90 Base) MCG/ACT inhaler Inhale 2 puffs into the lungs every 6 (six) hours as needed for wheezing or shortness of breath (Cough).   Ascorbic Acid (VITAMIN C GUMMIE PO) Take 2 each by mouth daily.   B Complex-C (B-COMPLEX WITH VITAMIN C) tablet Take 1 tablet by mouth daily.   ELDERBERRY PO Take 1 each by mouth daily.   ibuprofen (ADVIL,MOTRIN) 200 MG tablet Take 400 mg by mouth every 6 (six) hours as needed. Pain   ipratropium (ATROVENT) 0.06 % nasal spray Place 2 sprays into both nostrils 3 (three) times daily. As needed for nasal congestion, runny nose   LORazepam (ATIVAN) 0.5 MG tablet Take 1 tablet (0.5 mg total) by mouth daily as needed for anxiety (only as needed panic attacks).   omeprazole (PRILOSEC) 20 MG capsule Take 1 capsule (20 mg total) by mouth daily.   [DISCONTINUED] amoxicillin-clavulanate (AUGMENTIN) 875-125 MG tablet Take 1 tablet by mouth 2 (two) times daily.   [DISCONTINUED] benzonatate (TESSALON PERLES) 100 MG capsule Take 1 capsule  (100 mg total) by mouth 3 (three) times daily as needed for cough.   [DISCONTINUED] carvedilol (COREG) 12.5 MG tablet Take 1 tablet (12.5 mg total) by mouth 2 (two) times daily with a meal.   [DISCONTINUED] Cholecalciferol (VITAMIN D) 50 MCG (2000 UT) CAPS Take 2 capsules by mouth daily.   [DISCONTINUED] cyclobenzaprine (FLEXERIL) 10 MG tablet TAKE 1 TABLET (10 MG TOTAL) BY MOUTH 3 (THREE) TIMES DAILY AS NEEDED FOR MUSCLE SPASMS.   [DISCONTINUED] HYDROcodone-acetaminophen (NORCO) 7.5-325 MG tablet Take 1 tablet by mouth every 6 (six) hours as needed for moderate pain.   [DISCONTINUED] rosuvastatin (CRESTOR) 5 MG tablet TAKE 1 TABLET BY MOUTH NIGHTLY BEFORE BED.   No facility-administered medications prior to visit.    ROS Negative unless otherwise noted in HPI   Objective:     BP (!) 143/92 (BP Location: Left Arm, Patient Position: Sitting, Cuff Size: Large)   Pulse 80   Resp 18   Ht 5\' 6"  (1.676 m)   Wt 236 lb (107 kg)   LMP 01/07/2009   SpO2 95%   BMI 38.09 kg/m   Physical Exam Constitutional:      General: She is not in acute distress.    Appearance: Normal appearance.  HENT:     Head: Normocephalic and atraumatic.  Cardiovascular:     Rate and Rhythm: Normal rate and regular rhythm.     Heart sounds: No murmur heard.    No friction rub. No gallop.  Pulmonary:     Effort: Pulmonary effort  is normal. No respiratory distress.     Breath sounds: No wheezing, rhonchi or rales.  Skin:    General: Skin is warm and dry.  Neurological:     Mental Status: She is alert and oriented to person, place, and time.     Assessment & Plan:  Essential hypertension Assessment & Plan: BP goal <140/90.  Continue carvedilol 12.5 mg twice daily.  Checking electrolytes and renal function today.  Will continue to monitor.  Orders: -     CBC with Differential/Platelet; Future -     Comprehensive metabolic panel; Future -     Carvedilol; Take 1 tablet (12.5 mg total) by mouth 2 (two)  times daily with a meal.  Dispense: 180 tablet; Refill: 1  Mixed hyperlipidemia Assessment & Plan: Last lipid panel: LDL 99, HDL 43, triglycerides 160.  Repeating lipid panel and hepatic function today.  Continue rosuvastatin 5 mg daily unless lab results warrant change.  Orders: -     CBC with Differential/Platelet; Future -     Comprehensive metabolic panel; Future -     Lipid panel; Future -     Rosuvastatin Calcium; TAKE 1 TABLET BY MOUTH NIGHTLY BEFORE BED.  Dispense: 90 tablet; Refill: 1  Prediabetes Assessment & Plan: Most recent A1c 6.1, repeating today.  Continue limiting simple carbohydrates and sugary foods and drinks.  Orders: -     Hemoglobin A1c; Future  Screening for viral disease -     Hepatitis C antibody; Future -     HIV Antibody (routine testing w rflx); Future    Return in about 6 months (around 04/16/2024) for annual physical, fasting blood work 1 week before.    Melida Quitter, PA

## 2023-10-18 NOTE — Assessment & Plan Note (Signed)
Most recent A1c 6.1, repeating today.  Continue limiting simple carbohydrates and sugary foods and drinks.

## 2023-10-18 NOTE — Assessment & Plan Note (Signed)
BP goal <140/90.  Continue carvedilol 12.5 mg twice daily.  Checking electrolytes and renal function today.  Will continue to monitor.

## 2023-10-28 ENCOUNTER — Telehealth: Payer: Self-pay

## 2023-10-28 NOTE — Telephone Encounter (Signed)
[facility] only had records of the flu vaccine received in 2022 & 2021

## 2023-10-28 NOTE — Telephone Encounter (Signed)
-----   Message from Melida Quitter sent at 10/25/2023  9:29 AM EST ----- Please request vaccine record from [facility] :)

## 2023-11-14 ENCOUNTER — Other Ambulatory Visit: Payer: Self-pay | Admitting: Family Medicine

## 2023-11-14 DIAGNOSIS — K2100 Gastro-esophageal reflux disease with esophagitis, without bleeding: Secondary | ICD-10-CM

## 2024-01-16 ENCOUNTER — Encounter: Payer: Self-pay | Admitting: Family Medicine

## 2024-01-16 ENCOUNTER — Telehealth: Payer: Self-pay | Admitting: *Deleted

## 2024-01-16 NOTE — Telephone Encounter (Signed)
 Copied from CRM [id number]. Topic: Appointments - Scheduling Inquiry for Clinic >> Jan 16, 2024 11:29 AM Curlee DEL wrote: Reason for CRM: Patient called to reschedule their visit per letter sent to the patient regarding Joesph leaving. At the time, the contact center agent was not aware that a new provider would be starting for the practice - Can you add this patient to your list of patients to call for the new provider?

## 2024-01-16 NOTE — Telephone Encounter (Signed)
 Pt has an appt with Dr. Arabella Beach, if pt chooses to go to different provider we can make that transition once she starts.

## 2024-02-14 ENCOUNTER — Other Ambulatory Visit: Payer: Self-pay | Admitting: Family Medicine

## 2024-02-14 DIAGNOSIS — K2100 Gastro-esophageal reflux disease with esophagitis, without bleeding: Secondary | ICD-10-CM

## 2024-02-24 ENCOUNTER — Other Ambulatory Visit: Payer: Self-pay | Admitting: Obstetrics and Gynecology

## 2024-02-24 DIAGNOSIS — Z1231 Encounter for screening mammogram for malignant neoplasm of breast: Secondary | ICD-10-CM

## 2024-04-09 ENCOUNTER — Other Ambulatory Visit: Payer: BC Managed Care – PPO

## 2024-04-09 ENCOUNTER — Other Ambulatory Visit: Payer: Self-pay | Admitting: *Deleted

## 2024-04-09 DIAGNOSIS — Z1159 Encounter for screening for other viral diseases: Secondary | ICD-10-CM | POA: Diagnosis not present

## 2024-04-09 DIAGNOSIS — E782 Mixed hyperlipidemia: Secondary | ICD-10-CM

## 2024-04-09 DIAGNOSIS — I10 Essential (primary) hypertension: Secondary | ICD-10-CM

## 2024-04-09 DIAGNOSIS — R7303 Prediabetes: Secondary | ICD-10-CM

## 2024-04-09 DIAGNOSIS — E559 Vitamin D deficiency, unspecified: Secondary | ICD-10-CM

## 2024-04-09 NOTE — Addendum Note (Signed)
 Addended by: ZIMMERMAN RUMPLE, Tuwanda Vokes D on: 04/09/2024 09:05 AM   Modules accepted: Orders

## 2024-04-10 LAB — CBC WITH DIFFERENTIAL/PLATELET
Basophils Absolute: 0.1 10*3/uL (ref 0.0–0.2)
Basos: 1 %
EOS (ABSOLUTE): 0.2 10*3/uL (ref 0.0–0.4)
Eos: 2 %
Hematocrit: 40.1 % (ref 34.0–46.6)
Hemoglobin: 12.6 g/dL (ref 11.1–15.9)
Immature Grans (Abs): 0 10*3/uL (ref 0.0–0.1)
Immature Granulocytes: 0 %
Lymphocytes Absolute: 3.6 10*3/uL — ABNORMAL HIGH (ref 0.7–3.1)
Lymphs: 39 %
MCH: 26.5 pg — ABNORMAL LOW (ref 26.6–33.0)
MCHC: 31.4 g/dL — ABNORMAL LOW (ref 31.5–35.7)
MCV: 84 fL (ref 79–97)
Monocytes Absolute: 0.5 10*3/uL (ref 0.1–0.9)
Monocytes: 5 %
Neutrophils Absolute: 4.9 10*3/uL (ref 1.4–7.0)
Neutrophils: 53 %
Platelets: 289 10*3/uL (ref 150–450)
RBC: 4.75 x10E6/uL (ref 3.77–5.28)
RDW: 12.7 % (ref 11.7–15.4)
WBC: 9.2 10*3/uL (ref 3.4–10.8)

## 2024-04-10 LAB — COMPREHENSIVE METABOLIC PANEL WITH GFR
ALT: 16 IU/L (ref 0–32)
AST: 17 IU/L (ref 0–40)
Albumin: 4.7 g/dL (ref 3.9–4.9)
Alkaline Phosphatase: 107 IU/L (ref 44–121)
BUN/Creatinine Ratio: 15 (ref 12–28)
BUN: 9 mg/dL (ref 8–27)
Bilirubin Total: 0.3 mg/dL (ref 0.0–1.2)
CO2: 23 mmol/L (ref 20–29)
Calcium: 9.9 mg/dL (ref 8.7–10.3)
Chloride: 103 mmol/L (ref 96–106)
Creatinine, Ser: 0.59 mg/dL (ref 0.57–1.00)
Globulin, Total: 2.5 g/dL (ref 1.5–4.5)
Glucose: 92 mg/dL (ref 70–99)
Potassium: 5 mmol/L (ref 3.5–5.2)
Sodium: 141 mmol/L (ref 134–144)
Total Protein: 7.2 g/dL (ref 6.0–8.5)
eGFR: 102 mL/min/{1.73_m2} (ref >59)

## 2024-04-10 LAB — LIPID PANEL
Chol/HDL Ratio: 4 ratio (ref 0.0–4.4)
Cholesterol, Total: 163 mg/dL (ref 100–199)
HDL: 41 mg/dL (ref >39)
LDL Chol Calc (NIH): 96 mg/dL (ref 0–99)
Triglycerides: 147 mg/dL (ref 0–149)
VLDL Cholesterol Cal: 26 mg/dL (ref 5–40)

## 2024-04-10 LAB — HEPATITIS C ANTIBODY: Hep C Virus Ab: NONREACTIVE

## 2024-04-10 LAB — HEMOGLOBIN A1C
Est. average glucose Bld gHb Est-mCnc: 117 mg/dL
Hgb A1c MFr Bld: 5.7 % — ABNORMAL HIGH (ref 4.8–5.6)

## 2024-04-10 LAB — VITAMIN D 25 HYDROXY (VIT D DEFICIENCY, FRACTURES): Vit D, 25-Hydroxy: 38.2 ng/mL (ref 30.0–100.0)

## 2024-04-16 ENCOUNTER — Encounter: Payer: BC Managed Care – PPO | Admitting: Family Medicine

## 2024-04-16 ENCOUNTER — Encounter: Payer: Self-pay | Admitting: Family Medicine

## 2024-04-16 ENCOUNTER — Ambulatory Visit (INDEPENDENT_AMBULATORY_CARE_PROVIDER_SITE_OTHER): Payer: BC Managed Care – PPO | Admitting: Family Medicine

## 2024-04-16 VITALS — BP 128/81 | HR 72 | Ht 66.0 in | Wt 220.0 lb

## 2024-04-16 DIAGNOSIS — Z0000 Encounter for general adult medical examination without abnormal findings: Secondary | ICD-10-CM | POA: Diagnosis not present

## 2024-04-16 DIAGNOSIS — E782 Mixed hyperlipidemia: Secondary | ICD-10-CM

## 2024-04-16 DIAGNOSIS — F411 Generalized anxiety disorder: Secondary | ICD-10-CM

## 2024-04-16 DIAGNOSIS — I10 Essential (primary) hypertension: Secondary | ICD-10-CM

## 2024-04-16 DIAGNOSIS — E507 Other ocular manifestations of vitamin A deficiency: Secondary | ICD-10-CM | POA: Insufficient documentation

## 2024-04-16 DIAGNOSIS — R7303 Prediabetes: Secondary | ICD-10-CM

## 2024-04-16 DIAGNOSIS — D72820 Lymphocytosis (symptomatic): Secondary | ICD-10-CM

## 2024-04-16 DIAGNOSIS — K2100 Gastro-esophageal reflux disease with esophagitis, without bleeding: Secondary | ICD-10-CM

## 2024-04-16 MED ORDER — ALBUTEROL SULFATE HFA 108 (90 BASE) MCG/ACT IN AERS: 2.0000 | INHALATION_SPRAY | Freq: Four times a day (QID) | RESPIRATORY_TRACT | 11 refills | Status: AC | PRN

## 2024-04-16 MED ORDER — OMEPRAZOLE 20 MG PO CPDR: 20.0000 mg | DELAYED_RELEASE_CAPSULE | Freq: Every day | ORAL | 3 refills | Status: AC

## 2024-04-16 MED ORDER — LORAZEPAM 0.5 MG PO TABS
0.5000 mg | ORAL_TABLET | Freq: Every day | ORAL | 0 refills | Status: AC | PRN
Start: 2024-04-16 — End: ?

## 2024-04-16 MED ORDER — CARVEDILOL 12.5 MG PO TABS: 12.5000 mg | ORAL_TABLET | Freq: Two times a day (BID) | ORAL | 3 refills | Status: AC

## 2024-04-16 MED ORDER — ROSUVASTATIN CALCIUM 5 MG PO TABS: ORAL_TABLET | ORAL | 3 refills | Status: AC

## 2024-04-16 NOTE — Assessment & Plan Note (Signed)
 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  Has ophthalmologist, Dr. Ambrosio Junker.  Advised her to f/u with him regarding other options.

## 2024-04-16 NOTE — Assessment & Plan Note (Signed)
At goal.  Continue Crestor. 

## 2024-04-16 NOTE — Patient Instructions (Signed)
 It was nice to see you today,  We addressed the following topics today: -No changes to your medications today. - In 6 months I would like to recheck your CBC. - I would talk to your eye doctor about additional eyedrops for dry eyes.  Have a great day,  Etha Henle, MD

## 2024-04-16 NOTE — Assessment & Plan Note (Signed)
 Improved.  Due to weight loss.

## 2024-04-16 NOTE — Progress Notes (Signed)
 Annual physical  Subjective    Patient ID: Julia Zuniga, female    DOB: 30-Aug-1961  Age: 63 y.o. MRN: [id number]  Chief Complaint  Patient presents with   Annual Exam   HPI Julia Zuniga is a 63 y.o. old female here  for annual exam.   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 - Dry eyes - severe symptoms requiring eye drops every 15 minutes at times - Previously diagnosed with dry eyes by eye doctor - Has tried preservative-free lubricating eye drops with limited relief - Tried Pataday  eye drops without improvement - Reports needing to take allergy medication daily - Uses Nasacort  daily for allergies - Concerned about elevated lymphocyte count on recent labs - Planning train trip to [place]  on 05/05/2024 - Requesting lorazepam  renewal for anxiety related to travel  Medications Omeprazole , rosuvastatin , carvedilol , albuterol  inhaler PRN, lorazepam  PRN (last filled 2022), Nasacort  daily, preservative-free lubricating eye drops  PMH, PSH, FH, Social Hx PMH: Diabetes mellitus, hyperlipidemia, hypertension, anxiety, allergies, history of breast cancer PSH: Knee surgery Social Hx: Denies tobacco use, rare alcohol use, employed FH: Not discussed  ROS HEENT: Reports dry eyes, nasal congestion, ear fullness Respiratory: No complaints reported CV: No complaints reported GI: No complaints reported   The 10-year ASCVD risk score (Arnett DK, et al., 2019) is: 5.1%  Health Maintenance Due  Topic Date Due   Zoster Vaccines- Shingrix (1 of 2) Never done   COVID-19 Vaccine (3 - 2024-25 season) 08/11/2023   Colonoscopy  03/26/2024      Objective:     BP 128/81   Pulse 72   Ht 5\' 6"  (1.676 m)   Wt 220 lb (99.8 kg)   LMP 01/07/2009   SpO2 96%   BMI 35.51  kg/m    Physical Exam General: Alert, oriented HEENT: PERRLA, EOMI CV: Regular rhythm GI: Soft, normal bowel sounds. Pulmonary: Lungs hear bilaterally MSK: Strength is bilaterally, normal gait Extremities: No pedal edema Psych: Pleasant affect   No results found for any visits on 04/16/24.      Assessment & Plan:   Physical exam, annual  Prediabetes Assessment & Plan: Improved.  Due to weight loss.   Generalized anxiety disorder Assessment & Plan: Sending in ativan  refill.  Last refilled 2022.   Orders: -     LORazepam ; Take 1 tablet (0.5 mg total) by mouth daily as needed for anxiety (only as needed panic attacks).  Dispense: 30 tablet; Refill: 0  Essential hypertension Assessment & Plan: Under control today.  Continue amlodipine .  Orders: -     Carvedilol ; Take 1 tablet (12.5 mg total) by mouth 2 (two) times daily with a meal.  Dispense: 180 tablet; Refill: 3  Mixed hyperlipidemia Assessment & Plan: At goal.  Continue Crestor .  Orders: -     Rosuvastatin  Calcium ; TAKE 1 TABLET BY MOUTH NIGHTLY BEFORE BED.  Dispense: 90 tablet; Refill: 3  Gastroesophageal reflux disease with esophagitis, unspecified whether hemorrhage -     Omeprazole ; Take 1 capsule (20 mg total) by mouth daily.  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  Has ophthalmologist, Dr. Ambrosio Junker.  Advised her to f/u with him regarding other options.    Elevated lymphocytes -  CBC with Differential/Platelet; Future  Other orders -     Albuterol  Sulfate HFA; Inhale 2 puffs into the lungs every 6 (six) hours as needed for wheezing or shortness of breath (Cough).  Dispense: 36 g; Refill: 11     Return in about 1 year (around 04/16/2025) for physical.    Laneta Pintos, MD

## 2024-04-16 NOTE — Assessment & Plan Note (Signed)
 Sending in ativan  refill.  Last refilled 2022.

## 2024-04-16 NOTE — Assessment & Plan Note (Signed)
 Under control today.  Continue amlodipine .

## 2024-05-21 ENCOUNTER — Ambulatory Visit
Admission: RE | Admit: 2024-05-21 | Discharge: 2024-05-21 | Disposition: A | Discharge status: Home / Self Care | Source: Ambulatory Visit | Attending: Obstetrics and Gynecology | Admitting: Obstetrics and Gynecology

## 2024-05-21 DIAGNOSIS — Z1231 Encounter for screening mammogram for malignant neoplasm of breast: Secondary | ICD-10-CM

## 2024-10-16 ENCOUNTER — Other Ambulatory Visit

## 2024-10-16 DIAGNOSIS — D72820 Lymphocytosis (symptomatic): Secondary | ICD-10-CM

## 2024-10-17 LAB — CBC WITH DIFFERENTIAL/PLATELET
Basophils Absolute: 0.1 x10E3/uL (ref 0.0–0.2)
Basos: 1 %
EOS (ABSOLUTE): 0.3 x10E3/uL (ref 0.0–0.4)
Eos: 2 %
Hematocrit: 40.1 % (ref 34.0–46.6)
Hemoglobin: 12.9 g/dL (ref 11.1–15.9)
Immature Grans (Abs): 0 x10E3/uL (ref 0.0–0.1)
Immature Granulocytes: 0 %
Lymphocytes Absolute: 3.2 x10E3/uL — ABNORMAL HIGH (ref 0.7–3.1)
Lymphs: 29 %
MCH: 26.6 pg (ref 26.6–33.0)
MCHC: 32.2 g/dL (ref 31.5–35.7)
MCV: 83 fL (ref 79–97)
Monocytes Absolute: 0.6 x10E3/uL (ref 0.1–0.9)
Monocytes: 6 %
Neutrophils Absolute: 7 x10E3/uL (ref 1.4–7.0)
Neutrophils: 62 %
Platelets: 266 x10E3/uL (ref 150–450)
RBC: 4.85 x10E6/uL (ref 3.77–5.28)
RDW: 13.1 % (ref 11.7–15.4)
WBC: 11.3 x10E3/uL — ABNORMAL HIGH (ref 3.4–10.8)

## 2024-10-19 ENCOUNTER — Ambulatory Visit: Payer: Self-pay | Admitting: Family Medicine

## 2024-10-29 ENCOUNTER — Encounter

## 2024-11-04 ENCOUNTER — Telehealth: Admitting: Physician Assistant

## 2024-11-04 DIAGNOSIS — J019 Acute sinusitis, unspecified: Secondary | ICD-10-CM | POA: Diagnosis not present

## 2024-11-04 DIAGNOSIS — B9689 Other specified bacterial agents as the cause of diseases classified elsewhere: Secondary | ICD-10-CM

## 2024-11-04 MED ORDER — DOXYCYCLINE HYCLATE 100 MG PO TABS: 100.0000 mg | ORAL_TABLET | Freq: Two times a day (BID) | ORAL | 0 refills | Status: AC

## 2024-11-04 NOTE — Progress Notes (Signed)

## 2024-11-12 ENCOUNTER — Encounter: Admitting: Gastroenterology

## 2025-04-09 ENCOUNTER — Other Ambulatory Visit

## 2025-04-16 ENCOUNTER — Encounter: Admitting: Family Medicine
